# Patient Record
Sex: Male | Born: 1960
Health system: Southern US, Community
[De-identification: ages and names within clinical notes are randomized; demographics above are authoritative.]

## PROBLEM LIST (undated history)

## (undated) DIAGNOSIS — M75101 Unspecified rotator cuff tear or rupture of right shoulder, not specified as traumatic: Secondary | ICD-10-CM

## (undated) DIAGNOSIS — J302 Other seasonal allergic rhinitis: Secondary | ICD-10-CM

## (undated) DIAGNOSIS — F1910 Other psychoactive substance abuse, uncomplicated: Secondary | ICD-10-CM

## (undated) DIAGNOSIS — F1010 Alcohol abuse, uncomplicated: Secondary | ICD-10-CM

## (undated) DIAGNOSIS — G4700 Insomnia, unspecified: Secondary | ICD-10-CM

## (undated) HISTORY — PX: OTHER SURGICAL HISTORY: SHX169

## (undated) HISTORY — DX: Insomnia, unspecified: G47.00

---

## 2000-12-07 ENCOUNTER — Encounter: Payer: Self-pay | Admitting: Emergency Medicine

## 2000-12-07 ENCOUNTER — Emergency Department (HOSPITAL_COMMUNITY): Admission: EM | Admit: 2000-12-07 | Discharge: 2000-12-07 | Payer: Self-pay | Admitting: Emergency Medicine

## 2001-01-27 ENCOUNTER — Emergency Department (HOSPITAL_COMMUNITY): Admission: EM | Admit: 2001-01-27 | Discharge: 2001-01-27 | Payer: Self-pay | Admitting: Emergency Medicine

## 2002-03-09 ENCOUNTER — Emergency Department (HOSPITAL_COMMUNITY): Admission: EM | Admit: 2002-03-09 | Discharge: 2002-03-09 | Payer: Self-pay | Admitting: Emergency Medicine

## 2002-06-17 ENCOUNTER — Emergency Department (HOSPITAL_COMMUNITY): Admission: EM | Admit: 2002-06-17 | Discharge: 2002-06-17 | Payer: Self-pay | Admitting: Emergency Medicine

## 2002-06-17 ENCOUNTER — Encounter: Payer: Self-pay | Admitting: Emergency Medicine

## 2004-04-26 ENCOUNTER — Emergency Department (HOSPITAL_COMMUNITY): Admission: EM | Admit: 2004-04-26 | Discharge: 2004-04-26 | Payer: Self-pay | Admitting: Family Medicine

## 2004-04-27 ENCOUNTER — Emergency Department (HOSPITAL_COMMUNITY): Admission: EM | Admit: 2004-04-27 | Discharge: 2004-04-27 | Payer: Self-pay | Admitting: Family Medicine

## 2004-04-28 ENCOUNTER — Emergency Department (HOSPITAL_COMMUNITY): Admission: EM | Admit: 2004-04-28 | Discharge: 2004-04-28 | Payer: Self-pay | Admitting: Family Medicine

## 2005-06-14 ENCOUNTER — Emergency Department (HOSPITAL_COMMUNITY): Admission: EM | Admit: 2005-06-14 | Discharge: 2005-06-14 | Payer: Self-pay | Admitting: Emergency Medicine

## 2005-06-27 ENCOUNTER — Emergency Department (HOSPITAL_COMMUNITY): Admission: EM | Admit: 2005-06-27 | Discharge: 2005-06-27 | Payer: Self-pay | Admitting: Emergency Medicine

## 2005-07-03 ENCOUNTER — Emergency Department (HOSPITAL_COMMUNITY): Admission: EM | Admit: 2005-07-03 | Discharge: 2005-07-03 | Payer: Self-pay | Admitting: Emergency Medicine

## 2005-10-06 ENCOUNTER — Emergency Department (HOSPITAL_COMMUNITY): Admission: EM | Admit: 2005-10-06 | Discharge: 2005-10-06 | Payer: Self-pay | Admitting: Emergency Medicine

## 2006-01-27 ENCOUNTER — Emergency Department (HOSPITAL_COMMUNITY): Admission: EM | Admit: 2006-01-27 | Discharge: 2006-01-27 | Payer: Self-pay | Admitting: Emergency Medicine

## 2008-01-07 ENCOUNTER — Emergency Department (HOSPITAL_COMMUNITY): Admission: EM | Admit: 2008-01-07 | Discharge: 2008-01-07 | Payer: Self-pay | Admitting: Emergency Medicine

## 2008-01-24 ENCOUNTER — Emergency Department (HOSPITAL_COMMUNITY): Admission: EM | Admit: 2008-01-24 | Discharge: 2008-01-25 | Payer: Self-pay | Admitting: Emergency Medicine

## 2009-10-07 ENCOUNTER — Emergency Department (HOSPITAL_COMMUNITY): Admission: EM | Admit: 2009-10-07 | Discharge: 2009-10-07 | Payer: Self-pay | Admitting: Emergency Medicine

## 2010-07-31 ENCOUNTER — Emergency Department (HOSPITAL_COMMUNITY): Admission: EM | Admit: 2010-07-31 | Discharge: 2010-07-31 | Payer: Self-pay | Admitting: Emergency Medicine

## 2011-03-11 LAB — DIFFERENTIAL
Basophils Absolute: 0.1 10*3/uL (ref 0.0–0.1)
Basophils Relative: 1 % (ref 0–1)
Eosinophils Absolute: 0.3 10*3/uL (ref 0.0–0.7)
Eosinophils Relative: 5 % (ref 0–5)
Lymphocytes Relative: 33 % (ref 12–46)
Lymphs Abs: 2.2 10*3/uL (ref 0.7–4.0)
Monocytes Absolute: 0.4 10*3/uL (ref 0.1–1.0)
Monocytes Relative: 7 % (ref 3–12)
Neutro Abs: 3.7 10*3/uL (ref 1.7–7.7)
Neutrophils Relative %: 55 % (ref 43–77)

## 2011-03-11 LAB — BASIC METABOLIC PANEL
BUN: 6 mg/dL (ref 6–23)
CO2: 23 mEq/L (ref 19–32)
Calcium: 8.8 mg/dL (ref 8.4–10.5)
Chloride: 104 mEq/L (ref 96–112)
Creatinine, Ser: 0.86 mg/dL (ref 0.4–1.5)
GFR calc Af Amer: >60 mL/min (ref >60)
GFR calc non Af Amer: >60 mL/min (ref >60)
Glucose, Bld: 89 mg/dL (ref 70–99)
Potassium: 3.4 mEq/L — ABNORMAL LOW (ref 3.5–5.1)
Sodium: 138 mEq/L (ref 135–145)

## 2011-03-11 LAB — CBC
HCT: 39 % (ref 39.0–52.0)
Hemoglobin: 13.7 g/dL (ref 13.0–17.0)
MCH: 34.1 pg — ABNORMAL HIGH (ref 26.0–34.0)
MCHC: 35.1 g/dL (ref 30.0–36.0)
MCV: 97.3 fL (ref 78.0–100.0)
Platelets: 281 10*3/uL (ref 150–400)
RBC: 4.01 MIL/uL — ABNORMAL LOW (ref 4.22–5.81)
RDW: 13.3 % (ref 11.5–15.5)
WBC: 6.8 10*3/uL (ref 4.0–10.5)

## 2011-03-11 LAB — RAPID URINE DRUG SCREEN, HOSP PERFORMED
Amphetamines: NOT DETECTED
Barbiturates: NOT DETECTED
Benzodiazepines: NOT DETECTED
Cocaine: POSITIVE — AB
Opiates: NOT DETECTED
Tetrahydrocannabinol: NOT DETECTED

## 2011-03-11 LAB — ETHANOL: Alcohol, Ethyl (B): 284 mg/dL — ABNORMAL HIGH (ref 0–10)

## 2011-03-31 LAB — URINALYSIS, ROUTINE W REFLEX MICROSCOPIC
Bilirubin Urine: NEGATIVE
Glucose, UA: NEGATIVE mg/dL
Hgb urine dipstick: NEGATIVE
Ketones, ur: NEGATIVE mg/dL
Nitrite: NEGATIVE
Protein, ur: NEGATIVE mg/dL
Specific Gravity, Urine: 1.006 (ref 1.005–1.030)
Urobilinogen, UA: 0.2 mg/dL (ref 0.0–1.0)
pH: 5 (ref 5.0–8.0)

## 2011-03-31 LAB — ETHANOL: Alcohol, Ethyl (B): 222 mg/dL — ABNORMAL HIGH (ref 0–10)

## 2011-03-31 LAB — DIFFERENTIAL
Basophils Absolute: 0 10*3/uL (ref 0.0–0.1)
Basophils Relative: 0 % (ref 0–1)
Eosinophils Absolute: 0.3 10*3/uL (ref 0.0–0.7)
Eosinophils Relative: 4 % (ref 0–5)
Lymphocytes Relative: 17 % (ref 12–46)
Lymphs Abs: 1.5 10*3/uL (ref 0.7–4.0)
Monocytes Absolute: 0.4 10*3/uL (ref 0.1–1.0)
Monocytes Relative: 4 % (ref 3–12)
Neutro Abs: 6.6 10*3/uL (ref 1.7–7.7)
Neutrophils Relative %: 74 % (ref 43–77)

## 2011-03-31 LAB — COMPREHENSIVE METABOLIC PANEL
ALT: 20 U/L (ref 0–53)
AST: 30 U/L (ref 0–37)
Albumin: 4.3 g/dL (ref 3.5–5.2)
Alkaline Phosphatase: 61 U/L (ref 39–117)
BUN: 6 mg/dL (ref 6–23)
CO2: 22 mEq/L (ref 19–32)
Calcium: 9.1 mg/dL (ref 8.4–10.5)
Chloride: 105 mEq/L (ref 96–112)
Creatinine, Ser: 0.79 mg/dL (ref 0.4–1.5)
GFR calc Af Amer: >60 mL/min (ref >60)
GFR calc non Af Amer: >60 mL/min (ref >60)
Glucose, Bld: 99 mg/dL (ref 70–99)
Potassium: 4 mEq/L (ref 3.5–5.1)
Sodium: 138 mEq/L (ref 135–145)
Total Bilirubin: 0.8 mg/dL (ref 0.3–1.2)
Total Protein: 7.8 g/dL (ref 6.0–8.3)

## 2011-03-31 LAB — RAPID URINE DRUG SCREEN, HOSP PERFORMED
Amphetamines: NOT DETECTED
Barbiturates: NOT DETECTED
Benzodiazepines: NOT DETECTED
Cocaine: POSITIVE — AB
Opiates: NOT DETECTED
Tetrahydrocannabinol: NOT DETECTED

## 2011-03-31 LAB — CBC
HCT: 43 % (ref 39.0–52.0)
Hemoglobin: 14.9 g/dL (ref 13.0–17.0)
MCHC: 34.7 g/dL (ref 30.0–36.0)
MCV: 98.8 fL (ref 78.0–100.0)
Platelets: 303 10*3/uL (ref 150–400)
RBC: 4.36 MIL/uL (ref 4.22–5.81)
RDW: 13 % (ref 11.5–15.5)
WBC: 8.9 10*3/uL (ref 4.0–10.5)

## 2011-09-14 LAB — DIFFERENTIAL
Basophils Absolute: 0.1
Basophils Relative: 2 — ABNORMAL HIGH
Eosinophils Absolute: 0.1
Eosinophils Relative: 4
Lymphocytes Relative: 32
Lymphs Abs: 1.2
Monocytes Absolute: 0.3
Monocytes Relative: 9
Neutro Abs: 2.1
Neutrophils Relative %: 54

## 2011-09-14 LAB — RAPID URINE DRUG SCREEN, HOSP PERFORMED
Amphetamines: NOT DETECTED
Barbiturates: NOT DETECTED
Benzodiazepines: NOT DETECTED
Cocaine: NOT DETECTED
Opiates: NOT DETECTED
Tetrahydrocannabinol: POSITIVE — AB

## 2011-09-14 LAB — I-STAT 8, (EC8 V) (CONVERTED LAB)
Acid-base deficit: 1
BUN: 5 — ABNORMAL LOW
Bicarbonate: 24.4 — ABNORMAL HIGH
Chloride: 109
Glucose, Bld: 89
HCT: 44
Hemoglobin: 15
Operator id: 146091
Potassium: 4.2
Sodium: 140
TCO2: 26
pCO2, Ven: 41.6 — ABNORMAL LOW
pH, Ven: 7.377 — ABNORMAL HIGH

## 2011-09-14 LAB — POCT I-STAT CREATININE
Creatinine, Ser: 0.9
Operator id: 146091

## 2011-09-14 LAB — CBC
HCT: 39.3
Hemoglobin: 13.6
MCHC: 34.5
MCV: 98.1
Platelets: 311
RBC: 4.01 — ABNORMAL LOW
RDW: 13.6
WBC: 3.8 — ABNORMAL LOW

## 2011-09-14 LAB — ETHANOL: Alcohol, Ethyl (B): 17 — ABNORMAL HIGH

## 2011-09-15 LAB — ETHANOL
Alcohol, Ethyl (B): 174 — ABNORMAL HIGH
Alcohol, Ethyl (B): 255 — ABNORMAL HIGH

## 2011-09-15 LAB — DIFFERENTIAL
Basophils Absolute: 0
Basophils Relative: 1
Eosinophils Absolute: 0.3
Eosinophils Relative: 5
Lymphocytes Relative: 35
Lymphs Abs: 2.2
Monocytes Absolute: 0.5
Monocytes Relative: 8
Neutro Abs: 3.2
Neutrophils Relative %: 52

## 2011-09-15 LAB — I-STAT 8, (EC8 V) (CONVERTED LAB)
Acid-base deficit: 1
BUN: 7
Bicarbonate: 25.1 — ABNORMAL HIGH
Chloride: 111
Glucose, Bld: 96
HCT: 45
Hemoglobin: 15.3
Operator id: 270651
Potassium: 4.2
Sodium: 142
TCO2: 26
pCO2, Ven: 43.7 — ABNORMAL LOW
pH, Ven: 7.367 — ABNORMAL HIGH

## 2011-09-15 LAB — CBC
HCT: 40.4
Hemoglobin: 14
MCHC: 34.6
MCV: 97.3
Platelets: 271
RBC: 4.15 — ABNORMAL LOW
RDW: 13.6
WBC: 6.1

## 2011-09-15 LAB — RAPID URINE DRUG SCREEN, HOSP PERFORMED
Amphetamines: NOT DETECTED
Barbiturates: NOT DETECTED
Benzodiazepines: NOT DETECTED
Cocaine: NOT DETECTED
Opiates: NOT DETECTED
Tetrahydrocannabinol: POSITIVE — AB

## 2011-09-15 LAB — POCT I-STAT CREATININE
Creatinine, Ser: 1
Operator id: 270651

## 2013-06-13 ENCOUNTER — Encounter (HOSPITAL_COMMUNITY): Payer: Self-pay | Admitting: Emergency Medicine

## 2013-06-13 ENCOUNTER — Emergency Department (HOSPITAL_COMMUNITY)
Admission: EM | Admit: 2013-06-13 | Discharge: 2013-06-13 | Disposition: A | Discharge status: Home / Self Care | Payer: Self-pay | Attending: Emergency Medicine | Admitting: Emergency Medicine

## 2013-06-13 DIAGNOSIS — F1410 Cocaine abuse, uncomplicated: Secondary | ICD-10-CM | POA: Insufficient documentation

## 2013-06-13 DIAGNOSIS — F1910 Other psychoactive substance abuse, uncomplicated: Secondary | ICD-10-CM

## 2013-06-13 DIAGNOSIS — F1010 Alcohol abuse, uncomplicated: Secondary | ICD-10-CM | POA: Insufficient documentation

## 2013-06-13 HISTORY — DX: Other psychoactive substance abuse, uncomplicated: F19.10

## 2013-06-13 HISTORY — DX: Alcohol abuse, uncomplicated: F10.10

## 2013-06-13 LAB — COMPREHENSIVE METABOLIC PANEL
ALT: 21 U/L (ref 0–53)
AST: 28 U/L (ref 0–37)
Albumin: 3.6 g/dL (ref 3.5–5.2)
Alkaline Phosphatase: 59 U/L (ref 39–117)
BUN: 8 mg/dL (ref 6–23)
CO2: 22 mEq/L (ref 19–32)
Calcium: 8.7 mg/dL (ref 8.4–10.5)
Chloride: 101 mEq/L (ref 96–112)
Creatinine, Ser: 0.71 mg/dL (ref 0.50–1.35)
GFR calc Af Amer: >90 mL/min (ref >90)
GFR calc non Af Amer: >90 mL/min (ref >90)
Glucose, Bld: 138 mg/dL — ABNORMAL HIGH (ref 70–99)
Potassium: 4 mEq/L (ref 3.5–5.1)
Sodium: 133 mEq/L — ABNORMAL LOW (ref 135–145)
Total Bilirubin: 0.2 mg/dL — ABNORMAL LOW (ref 0.3–1.2)
Total Protein: 7 g/dL (ref 6.0–8.3)

## 2013-06-13 LAB — CBC
HCT: 39.3 % (ref 39.0–52.0)
Hemoglobin: 13.6 g/dL (ref 13.0–17.0)
MCH: 32.9 pg (ref 26.0–34.0)
MCHC: 34.6 g/dL (ref 30.0–36.0)
MCV: 95.2 fL (ref 78.0–100.0)
Platelets: 294 10*3/uL (ref 150–400)
RBC: 4.13 MIL/uL — ABNORMAL LOW (ref 4.22–5.81)
RDW: 12.5 % (ref 11.5–15.5)
WBC: 6 10*3/uL (ref 4.0–10.5)

## 2013-06-13 LAB — RAPID URINE DRUG SCREEN, HOSP PERFORMED
Amphetamines: NOT DETECTED
Barbiturates: NOT DETECTED
Benzodiazepines: NOT DETECTED
Cocaine: POSITIVE — AB
Opiates: NOT DETECTED
Tetrahydrocannabinol: NOT DETECTED

## 2013-06-13 LAB — ACETAMINOPHEN LEVEL: Acetaminophen (Tylenol), Serum: <15 ug/mL (ref 10–30)

## 2013-06-13 LAB — ETHANOL: Alcohol, Ethyl (B): <11 mg/dL (ref 0–11)

## 2013-06-13 LAB — SALICYLATE LEVEL: Salicylate Lvl: <2 mg/dL — ABNORMAL LOW (ref 2.8–20.0)

## 2013-06-13 MED ORDER — IBUPROFEN 600 MG PO TABS: 600.0000 mg | ORAL_TABLET | Freq: Three times a day (TID) | ORAL | Status: DC | PRN

## 2013-06-13 MED ORDER — ZOLPIDEM TARTRATE 5 MG PO TABS: 5.0000 mg | ORAL_TABLET | Freq: Every evening | ORAL | Status: DC | PRN

## 2013-06-13 MED ORDER — LORAZEPAM 1 MG PO TABS: 1.0000 mg | ORAL_TABLET | Freq: Three times a day (TID) | ORAL | Status: DC | PRN

## 2013-06-13 MED ORDER — ACETAMINOPHEN 325 MG PO TABS: 650.0000 mg | ORAL_TABLET | ORAL | Status: DC | PRN

## 2013-06-13 MED ORDER — ALUM & MAG HYDROXIDE-SIMETH 200-200-20 MG/5ML PO SUSP: 30.0000 mL | ORAL | Status: DC | PRN

## 2013-06-13 MED ORDER — ONDANSETRON HCL 4 MG PO TABS: 4.0000 mg | ORAL_TABLET | Freq: Three times a day (TID) | ORAL | Status: DC | PRN

## 2013-06-13 NOTE — ED Notes (Signed)
Oriented pt to psych unit. Pt verbalized understanding.

## 2013-06-13 NOTE — ED Provider Notes (Signed)
History     CSN: [id number]  Arrival date & time 06/13/13  1245   First MD Initiated Contact with Patient 06/13/13 1410      Chief Complaint  Patient presents with  . Medical Clearance    (Consider location/radiation/quality/duration/timing/severity/associated sxs/prior treatment) HPI Comments: Patient is a 52 year old male who presents for drug and alcohol detox. Patient reports he has been using crack cocaine and alcohol daily for the past 30 years. Patient wants help with detox because he doesn't like living like this anymore. Patient reports last use is this morning. He denies SI/HI. Patient denies any other symptoms.    Past Medical History  Diagnosis Date  . Alcohol abuse   . Drug abuse     History reviewed. No pertinent past surgical history.  No family history on file.  History  Substance Use Topics  . Smoking status: Not on file  . Smokeless tobacco: Not on file  . Alcohol Use: Not on file      Review of Systems  Psychiatric/Behavioral:       Substance abuse  All other systems reviewed and are negative.    Allergies  Review of patient's allergies indicates no known allergies.  Home Medications  No current outpatient prescriptions on file.  BP 129/85  Pulse 85  Temp(Src) 98.4 F (36.9 C) (Oral)  Resp 20  SpO2 97%  Physical Exam  Nursing note and vitals reviewed. Constitutional: He is oriented to person, place, and time. He appears well-developed and well-nourished. No distress.  HENT:  Head: Normocephalic and atraumatic.  Eyes: Conjunctivae are normal.  Neck: Normal range of motion.  Cardiovascular: Normal rate and regular rhythm.  Exam reveals no gallop and no friction rub.   No murmur heard. Pulmonary/Chest: Effort normal and breath sounds normal. He has no wheezes. He has no rales. He exhibits no tenderness.  Abdominal: Soft. There is no tenderness.  Musculoskeletal: Normal range of motion.  Neurological: He is alert and oriented to  person, place, and time. Coordination normal.  Speech is goal-oriented. Moves limbs without ataxia.   Skin: Skin is warm and dry.  Psychiatric:  Dysphoric mood.     ED Course  Procedures (including critical care time)  Labs Reviewed  CBC - Abnormal; Notable for the following:    RBC 4.13 (*)    All other components within normal limits  COMPREHENSIVE METABOLIC PANEL - Abnormal; Notable for the following:    Sodium 133 (*)    Glucose, Bld 138 (*)    Total Bilirubin 0.2 (*)    All other components within normal limits  SALICYLATE LEVEL - Abnormal; Notable for the following:    Salicylate Lvl <2.0 (*)    All other components within normal limits  URINE RAPID DRUG SCREEN (HOSP PERFORMED) - Abnormal; Notable for the following:    Cocaine POSITIVE (*)    All other components within normal limits  ACETAMINOPHEN LEVEL  ETHANOL   No results found.   1. Substance abuse       MDM  2:13 PM Patient will go to psych for further evaluation. Labs pending.        Emilia Beck, PA-C 06/15/13 1034  Medical screening examination/treatment/procedure(s) were performed by non-physician practitioner and as supervising physician I was immediately available for consultation/collaboration.   Derwood Kaplan, MD 06/15/13 1221

## 2013-06-13 NOTE — ED Notes (Signed)
Pt states that he drinks beer daily and smokes crack and has been doing it for years. States that he lost his job and went back to the drugs, denies si/hi and states that he wants help for this. Calm and volun. Here.

## 2013-12-13 ENCOUNTER — Inpatient Hospital Stay: Payer: Self-pay | Admitting: Student

## 2013-12-13 LAB — DRUG SCREEN, URINE
Amphetamines, Ur Screen: NEGATIVE (ref ?–1000)
Barbiturates, Ur Screen: NEGATIVE (ref ?–200)
Benzodiazepine, Ur Scrn: NEGATIVE (ref ?–200)
Cannabinoid 50 Ng, Ur ~~LOC~~: NEGATIVE (ref ?–50)
Cocaine Metabolite,Ur ~~LOC~~: NEGATIVE (ref ?–300)
MDMA (Ecstasy)Ur Screen: NEGATIVE (ref ?–500)
Methadone, Ur Screen: NEGATIVE (ref ?–300)
Opiate, Ur Screen: POSITIVE (ref ?–300)
Phencyclidine (PCP) Ur S: NEGATIVE (ref ?–25)
Tricyclic, Ur Screen: NEGATIVE (ref ?–1000)

## 2013-12-13 LAB — CBC
HCT: 38 % — ABNORMAL LOW (ref 40.0–52.0)
HGB: 12.7 g/dL — ABNORMAL LOW (ref 13.0–18.0)
MCH: 31.6 pg (ref 26.0–34.0)
MCHC: 33.4 g/dL (ref 32.0–36.0)
MCV: 95 fL (ref 80–100)
Platelet: 280 10*3/uL (ref 150–440)
RBC: 4.02 10*6/uL — ABNORMAL LOW (ref 4.40–5.90)
RDW: 12.8 % (ref 11.5–14.5)
WBC: 6.1 10*3/uL (ref 3.8–10.6)

## 2013-12-13 LAB — COMPREHENSIVE METABOLIC PANEL
Albumin: 3.1 g/dL — ABNORMAL LOW (ref 3.4–5.0)
Alkaline Phosphatase: 60 U/L
Anion Gap: 6 — ABNORMAL LOW (ref 7–16)
BUN: 9 mg/dL (ref 7–18)
Bilirubin,Total: 0.4 mg/dL (ref 0.2–1.0)
Calcium, Total: 8.3 mg/dL — ABNORMAL LOW (ref 8.5–10.1)
Chloride: 109 mmol/L — ABNORMAL HIGH (ref 98–107)
Co2: 24 mmol/L (ref 21–32)
Creatinine: 0.66 mg/dL (ref 0.60–1.30)
EGFR (African American): >60
EGFR (Non-African Amer.): >60
Glucose: 109 mg/dL — ABNORMAL HIGH (ref 65–99)
Osmolality: 277 (ref 275–301)
Potassium: 3.5 mmol/L (ref 3.5–5.1)
SGOT(AST): 26 U/L (ref 15–37)
SGPT (ALT): 22 U/L (ref 12–78)
Sodium: 139 mmol/L (ref 136–145)
Total Protein: 6.1 g/dL — ABNORMAL LOW (ref 6.4–8.2)

## 2013-12-14 LAB — CBC WITH DIFFERENTIAL/PLATELET
Basophil #: 0.1 10*3/uL (ref 0.0–0.1)
Basophil %: 1 %
Eosinophil #: 0.4 10*3/uL (ref 0.0–0.7)
Eosinophil %: 6.1 %
HCT: 35.5 % — ABNORMAL LOW (ref 40.0–52.0)
HGB: 12 g/dL — ABNORMAL LOW (ref 13.0–18.0)
Lymphocyte #: 1.5 10*3/uL (ref 1.0–3.6)
Lymphocyte %: 23.1 %
MCH: 31.9 pg (ref 26.0–34.0)
MCHC: 33.8 g/dL (ref 32.0–36.0)
MCV: 95 fL (ref 80–100)
Monocyte #: 0.6 x10 3/mm (ref 0.2–1.0)
Monocyte %: 9.9 %
Neutrophil #: 3.9 10*3/uL (ref 1.4–6.5)
Neutrophil %: 59.9 %
Platelet: 252 10*3/uL (ref 150–440)
RBC: 3.76 10*6/uL — ABNORMAL LOW (ref 4.40–5.90)
RDW: 13 % (ref 11.5–14.5)
WBC: 6.5 10*3/uL (ref 3.8–10.6)

## 2013-12-14 LAB — HEMOGLOBIN A1C: Hemoglobin A1C: 5.6 % (ref 4.2–6.3)

## 2013-12-14 LAB — BASIC METABOLIC PANEL
Anion Gap: 6 — ABNORMAL LOW (ref 7–16)
BUN: 11 mg/dL (ref 7–18)
Calcium, Total: 8.4 mg/dL — ABNORMAL LOW (ref 8.5–10.1)
Chloride: 105 mmol/L (ref 98–107)
Co2: 26 mmol/L (ref 21–32)
Creatinine: 0.81 mg/dL (ref 0.60–1.30)
EGFR (African American): >60
EGFR (Non-African Amer.): >60
Glucose: 124 mg/dL — ABNORMAL HIGH (ref 65–99)
Osmolality: 275 (ref 275–301)
Potassium: 3.4 mmol/L — ABNORMAL LOW (ref 3.5–5.1)
Sodium: 137 mmol/L (ref 136–145)

## 2013-12-15 LAB — VANCOMYCIN, TROUGH: Vancomycin, Trough: 15 ug/mL (ref 10–20)

## 2013-12-18 LAB — CULTURE, BLOOD (SINGLE)

## 2013-12-18 LAB — WOUND CULTURE

## 2014-02-03 ENCOUNTER — Emergency Department: Payer: Self-pay | Admitting: Emergency Medicine

## 2014-06-23 ENCOUNTER — Emergency Department (HOSPITAL_COMMUNITY)
Admission: EM | Admit: 2014-06-23 | Discharge: 2014-06-23 | Disposition: A | Discharge status: Home / Self Care | Payer: Managed Care, Other (non HMO) | Attending: Emergency Medicine | Admitting: Emergency Medicine

## 2014-06-23 ENCOUNTER — Encounter (HOSPITAL_COMMUNITY): Payer: Self-pay | Admitting: Emergency Medicine

## 2014-06-23 DIAGNOSIS — T63461A Toxic effect of venom of wasps, accidental (unintentional), initial encounter: Secondary | ICD-10-CM | POA: Insufficient documentation

## 2014-06-23 DIAGNOSIS — Z79899 Other long term (current) drug therapy: Secondary | ICD-10-CM | POA: Insufficient documentation

## 2014-06-23 DIAGNOSIS — Y929 Unspecified place or not applicable: Secondary | ICD-10-CM | POA: Insufficient documentation

## 2014-06-23 DIAGNOSIS — F17200 Nicotine dependence, unspecified, uncomplicated: Secondary | ICD-10-CM | POA: Insufficient documentation

## 2014-06-23 DIAGNOSIS — Y939 Activity, unspecified: Secondary | ICD-10-CM | POA: Insufficient documentation

## 2014-06-23 DIAGNOSIS — Z91030 Bee allergy status: Secondary | ICD-10-CM

## 2014-06-23 DIAGNOSIS — IMO0002 Reserved for concepts with insufficient information to code with codable children: Secondary | ICD-10-CM | POA: Insufficient documentation

## 2014-06-23 DIAGNOSIS — T6391XA Toxic effect of contact with unspecified venomous animal, accidental (unintentional), initial encounter: Secondary | ICD-10-CM | POA: Insufficient documentation

## 2014-06-23 MED ORDER — DIPHENHYDRAMINE HCL 25 MG PO CAPS
50.0000 mg | ORAL_CAPSULE | Freq: Once | ORAL | Status: AC
  Administered 2014-06-23: 50 mg via ORAL
  Filled 2014-06-23: qty 2

## 2014-06-23 MED ORDER — FAMOTIDINE 20 MG PO TABS
40.0000 mg | ORAL_TABLET | Freq: Once | ORAL | Status: AC
  Administered 2014-06-23: 40 mg via ORAL
  Filled 2014-06-23: qty 2

## 2014-06-23 MED ORDER — PREDNISONE 20 MG PO TABS: 40.0000 mg | ORAL_TABLET | Freq: Every day | ORAL | Status: DC

## 2014-06-23 MED ORDER — DIPHENHYDRAMINE HCL 25 MG PO TABS: 25.0000 mg | ORAL_TABLET | Freq: Four times a day (QID) | ORAL | Status: DC | PRN

## 2014-06-23 MED ORDER — FAMOTIDINE 20 MG PO TABS: 20.0000 mg | ORAL_TABLET | Freq: Two times a day (BID) | ORAL | Status: DC

## 2014-06-23 MED ORDER — PREDNISONE 20 MG PO TABS
60.0000 mg | ORAL_TABLET | Freq: Once | ORAL | Status: AC
  Administered 2014-06-23: 60 mg via ORAL
  Filled 2014-06-23: qty 3

## 2014-06-23 NOTE — ED Notes (Signed)
Patient reports his hand is still hurting, and can't tell the difference in swelling.

## 2014-06-23 NOTE — ED Notes (Signed)
Reported patient's request for food to [place], [place], and reported no increase in swelling of right hand.  She acknowledges and allows patient to have food at this time.

## 2014-06-23 NOTE — ED Notes (Signed)
PA at bedside.

## 2014-06-23 NOTE — ED Notes (Signed)
Hannah, PA-C, at the bedside.  

## 2014-06-23 NOTE — Discharge Instructions (Signed)
1. Medications: prednisone, benadryl, pepcid, usual home medications 2. Treatment: rest, drink plenty of fluids, use ice for swelling and pain 3. Follow Up: Please followup with your primary doctor for discussion of your diagnoses and further evaluation after today's visit; if you do not have a primary care doctor use the resource guide provided to find one;     Bee, Wasp, or [place] Your caregiver has diagnosed you as having an insect sting. An insect sting appears as a red lump in the skin that sometimes has a tiny hole in the center, or it may have a stinger in the center of the wound. The most common stings are from wasps, hornets and bees. Individuals have different reactions to insect stings.  A normal reaction may cause pain, swelling, and redness around the sting site.  A localized allergic reaction may cause swelling and redness that extends beyond the sting site.  A large local reaction may continue to develop over the next 12 to 36 hours.  On occasion, the reactions can be severe (anaphylactic reaction). An anaphylactic reaction may cause wheezing; difficulty breathing; chest pain; fainting; raised, itchy, red patches on the skin; a sick feeling to your stomach (nausea); vomiting; cramping; or diarrhea. If you have had an anaphylactic reaction to an insect sting in the past, you are more likely to have one again. HOME CARE INSTRUCTIONS   With bee stings, a small sac of poison is left in the wound. Brushing across this with something such as a credit card, or anything similar, will help remove this and decrease the amount of the reaction. This same procedure will not help a wasp sting as they do not leave behind a stinger and poison sac.  Apply a cold compress for 10 to 20 minutes every hour for 1 to 2 days, depending on severity, to reduce swelling and itching.  To lessen pain, a paste made of water and baking soda may be rubbed on the bite or sting and left on for 5  minutes.  To relieve itching and swelling, you may use take medication or apply medicated creams or lotions as directed.  Only take over-the-counter or prescription medicines for pain, discomfort, or fever as directed by your caregiver.  Wash the sting site daily with soap and water. Apply antibiotic ointment on the sting site as directed.  If you suffered a severe reaction:  If you did not require hospitalization, an adult will need to stay with you for 24 hours in case the symptoms return.  You may need to wear a medical bracelet or necklace stating the allergy.  You and your family need to learn when and how to use an anaphylaxis kit or epinephrine injection.  If you have had a severe reaction before, always carry your anaphylaxis kit with you. SEEK MEDICAL CARE IF:   None of the above helps within 2 to 3 days.  The area becomes red, warm, tender, and swollen beyond the area of the bite or sting.  You have an oral temperature above 102 F (38.9 C). SEEK IMMEDIATE MEDICAL CARE IF:  You have symptoms of an allergic reaction which are:  Wheezing.  Difficulty breathing.  Chest pain.  Lightheadedness or fainting.  Itchy, raised, red patches on the skin.  Nausea, vomiting, cramping or diarrhea. ANY OF THESE SYMPTOMS MAY REPRESENT A SERIOUS PROBLEM THAT IS AN EMERGENCY. Do not wait to see if the symptoms will go away. Get medical help right away. Call your local emergency services (  911 in U.S[place] DO NOT drive yourself to the hospital. MAKE SURE YOU:   Understand these instructions.  Will watch your condition.  Will get help right away if you are not doing well or get worse. Document Released: 12/12/2005 Document Revised: 03/05/2012 Document Reviewed: 05/29/2010 [facility] Patient Information 2015 [place], [place]. This information is not intended to replace advice given to you by your health care provider. Make sure you discuss any questions you have with your health care  provider.   Emergency Department Resource Guide 1) Find a Doctor and Pay Out of Pocket Although you won't have to find out who is covered by your insurance plan, it is a good idea to ask around and get recommendations. You will then need to call the office and see if the doctor you have chosen will accept you as a new patient and what types of options they offer for patients who are self-pay. Some doctors offer discounts or will set up payment plans for their patients who do not have insurance, but you will need to ask so you aren't surprised when you get to your appointment.  2) Contact Your Local Health Department Not all health departments have doctors that can see patients for sick visits, but many do, so it is worth a call to see if yours does. If you don't know where your local health department is, you can check in your phone book. The CDC also has a tool to help you locate your state's health department, and many state websites also have listings of all of their local health departments.  3) Find a [facility] If your illness is not likely to be very severe or complicated, you may want to try a walk in clinic. These are popping up all over the country in pharmacies, drugstores, and shopping centers. They're usually staffed by nurse practitioners or physician assistants that have been trained to treat common illnesses and complaints. They're usually fairly quick and inexpensive. However, if you have serious medical issues or chronic medical problems, these are probably not your best option.  No Primary Care Doctor: - Call Health Connect at  [phone] - they can help you locate a primary care doctor that  accepts your insurance, provides certain services, etc. - Physician Referral Service- [phone]  Chronic Pain Problems: Organization         Address  Phone   Notes  [facility]  [phone] Patients need to be referred by their primary care doctor.    Medication Assistance: Organization         Address  Phone   Notes  [facility] Medication [facility] [address] [place], [place] [address] [phone] --Must be a resident of [facility] -- Must have NO insurance coverage whatsoever (no Medicaid/ Medicare, etc.) -- The pt. MUST have a primary care doctor that directs their care regularly and follows them in the community   [facility]ist  [phone]   [employer]  [phone]    Agencies that provide inexpensive medical care: Organization         Address  Phone   Notes  [place]  [phone]   [facility]    [phone]   [facility] [place], [place] [phone]   [place] [address] [place] [phone]   Planned Parenthood    [phone]   [place]  [facility]    [phone]   Community Health and [facility]  [address], [place] Phone:  [phone], Fax:  [phone] Hours of Operation:  9 am - 6 pm, M-F.  Also accepts Medicaid/Medicare and self-pay.  [facility] for [place] [place], [address], [place] Phone: [phone], Fax: [phone]. Hours of Operation:  8:30 am - 5:30 pm, M-F.  Also accepts Medicaid and self-pay.  [facility] [address], [place] Phone: [phone]   [place], [place], [place] [phone], Ext. [phone] Mondays & Thursdays: 7-9 AM.  First 15 patients are seen on a first come, first serve basis.    [place] Providers:  Organization         Address  Phone   Notes  [facility] [address], [place] [phone] Also accepts self-pay patients.  [facility] [address], [place]  [phone]   [place], [address], [place] [phone]   [facility] [address], [place] [phone]   Lucianne Lei [address], [place]   [phone] Only accepts [name] Access [place] patients after they have their name applied to their card.   Self-Pay (no insurance) in [facility]:  Organization         Address  Phone   Notes  Sickle Cell Patients, [facility] [place] [phone]   [facility] [place] [phone]   [facility] [place]  [place], [place], [place] [phone]   [facility]/Dr. Osei-Bonsu  [address], [place] or [address], [place] [phone] Phone number for both [place] and [place] locations is the same.  Urgent Medical and [facility] [address], [place] [phone]   [facility] [address], [place] or [address] [phone]   [facility] [address] [place] [phone], phone; [phone], fax Sees patients 1st and 3rd Saturday of every month.  Must not qualify for public or private insurance (i.e. Medicaid, Medicare, [place] Health Choice, Veterans' Benefits)  Household income should be no more than 200% of the poverty level The clinic cannot treat you if you are pregnant or think you are pregnant  Sexually transmitted diseases are not treated at the clinic.    Dental Care: Organization         Address  Phone  Notes  [facility] Department of [facility] [place] [phone] Accepts children up to age 18 who are enrolled in [place] or [place]; pregnant women with a Medicaid card; and children who have applied for Medicaid or [place] Health Choice, but were declined, whose parents can pay a reduced fee at time of service.   [facility] Department of [facility]  [address], [place] [phone] Accepts children up to age 74 who are enrolled in [place] or [place]; pregnant women with a Medicaid card; and children who have applied for Medicaid or [place] Health Choice, but were declined, whose parents can pay a reduced fee at time of  service.  [place] Adult Dental Access PROGRAM  [facility] [place] [phone] Patients are seen by appointment only. Walk-ins are not accepted. Lane will see patients 73 years of age and older. Monday - Tuesday (8am-5pm) Most Wednesdays (8:30-5pm) $30 per visit, cash only  [facility] Adult Dental Access PROGRAM  [address], [facility] [phone] Patients are seen by appointment only. Walk-ins are not accepted. [place] will see patients 21 years of age and older. One Wednesday Evening (Monthly: Volunteer Based).  $30 per visit, cash only  [place]  [phone] for adults; Children under age 54, call Graduate Pediatric Dentistry at [phone]. Children aged 7-14, please call [phone] to request a pediatric application.  Dental services are provided in all areas of dental care including fillings, crowns and bridges, complete and partial dentures, implants, gum treatment, root canals, and extractions. Preventive care is also provided. Treatment is provided to both adults and children. Patients are selected via a lottery and there is often a waiting list.   [facility] [address], [place]  [phone] [email or web address]   [facility] [address] [place], [place] [phone], Ext. [phone] Second and Fourth Thursday of each month, opens at 6:30 AM; Clinic ends at 9 AM.  Patients are seen on a first-come first-served basis, and a limited number are seen during each clinic.   [facility]  [address] Hillard Danker [place], [place] [phone]   Eligibility Requirements You must have lived in [place], [place], or [place] counties for at least the last three months.   You cannot be eligible for state or federal sponsored [employer], including [employer], [place], or [employer].   You generally cannot be eligible for healthcare insurance through your employer.    How to apply: Eligibility screenings are held every Tuesday and Wednesday afternoon from 1:00 pm until 4:00 pm. You do not need an appointment for the interview!  [facility] [address] [place], [place]   [place]  [place] Department  [place]  [phone]    Behavioral Health Resources in the Community: Intensive Outpatient Programs Organization         Address  Phone  Notes  [place]. [address], [place], [place] [phone]   [facility] Outpatient [address], [place], [place]   ADS: Alcohol & Drug Svcs [address] [place], [place]   [place] [address]  [place], [place] or [phone]   Substance Abuse Resources Organization         Address  Phone  Notes  Alcohol and Drug Services  [phone]   [place]  [phone]   [facility] [place]   Chinita Pester  [phone]   Residential & Outpatient Substance Abuse Program  [phone]   Psychological Services Organization         Address  Phone  Notes  [facility] [place]  [place]  [phone]   [place] [address] [place] [phone] or [phone]    Mobile Crisis Teams Organization         Address  Phone  Notes  [facility], Mobile Crisis Care Unit  [phone]   Assertive Psychotherapeutic Services  [place], [place]   [facility] [address] [place] [phone]    Self-Help/Support Groups Organization         Address  Phone             Notes  [place]. of [place] - variety of support groups  [place] Call for more information  Narcotics Anonymous (NA), [facility] [address], [employer] [place]  2 meetings at this location   Special educational needs teacher         Address  Phone  Notes  ASAP Residential Treatment [place],    [place]  [phone]   [facility]  [address] [place] [address], [place], [place]   [place], [place] [phone] Admissions: 8am-3pm M-F  Incentives Substance [place] [address]    [place], [place] [phone]   [facility] [address] [place], [place], [place]   [facility] [address],  [place], [place]   [facility] - Intensive Outpatient [address] Kristeen [address], [place], [place]   [facility] [place].,  [place], [place] [phone] or [phone]   [name] [address], [place], [place] Accepts Medicaid  [facility] [address],  [place] [phone] Substance Abuse/Addiction Treatment   [facility] Organization         Address  Phone  Notes  [facility]  [phone]   Domenic Schwab, PhD [address] Arlis Porta [place], [place]   [phone] or [phone]   [place], [place] [phone]   [facility] [address], [place], [place] [phone] Insurance/Medicaid/sponsorship through [facility] and Families [address]                                    [place], [place] [phone]  [place] [address], [place] [phone]    Dr. Adele Schilder  [phone]   [facility] Dept. 1) [address],  2) [place] 3)  [place] [address], [place] [phone]  [phone]   [place] [phone] or [phone] (After Hours)

## 2014-06-23 NOTE — ED Notes (Signed)
PT ambulated with baseline gait; VSS; A&Ox3; no signs of distress; respirations even and unlabored; skin warm and dry; no questions upon discharge.  

## 2014-06-23 NOTE — ED Provider Notes (Signed)
CSN: [id number]     Arrival date & time 06/23/14  2020 History   First MD Initiated Contact with Patient 06/23/14 2054     Chief Complaint  Patient presents with  . Insect Bite     (Consider location/radiation/quality/duration/timing/severity/associated sxs/prior Treatment) The history is provided by the patient and medical records. No language interpreter was used.    [name] [name] is a 53 y.o. male  with a hx of alcohol and drug abuse presents to the Emergency Department complaining of gradual, persistent, progressively worsening swelling of the right hand and the left upper arm beginning approximately 5 PM after being stung several times by yellow jackets. Patient reports he was working outside when this happened. He reports increased swelling and pain at the site of the bite and through the hand and arm. He denies shortness of breath, chest pain, rash anywhere else, feeling of throat or mouth is closing. No aggravating or alleviating factors. Patient reports he did take ibuprofen without relief prior to arrival but did not attempt to take Benadryl. She denies fever, chills, headache, neck pain, difficulty breathing, difficulty swallowing, further rash, nausea, vomiting.   Past Medical History  Diagnosis Date  . Alcohol abuse   . Drug abuse    History reviewed. No pertinent past surgical history. History reviewed. No pertinent family history. History  Substance Use Topics  . Smoking status: Current Every Day Smoker  . Smokeless tobacco: Not on file  . Alcohol Use: 1.8 oz/week    3 Cans of beer per week     Comment: daily    Review of Systems  Constitutional: Negative for fever, diaphoresis, appetite change, fatigue and unexpected weight change.  HENT: Negative for mouth sores.   Eyes: Negative for visual disturbance.  Respiratory: Negative for cough, chest tightness, shortness of breath and wheezing.   Cardiovascular: Negative for chest pain.  Gastrointestinal: Negative  for nausea, vomiting, abdominal pain, diarrhea and constipation.  Endocrine: Negative for polydipsia, polyphagia and polyuria.  Genitourinary: Negative for dysuria, urgency, frequency and hematuria.  Musculoskeletal: Negative for back pain and neck stiffness.  Skin: Positive for rash and wound.  Allergic/Immunologic: Negative for immunocompromised state.  Neurological: Negative for syncope, light-headedness and headaches.  Hematological: Does not bruise/bleed easily.  Psychiatric/Behavioral: Negative for sleep disturbance. The patient is not nervous/anxious.       Allergies  Review of patient's allergies indicates no known allergies.  Home Medications   Prior to Admission medications   Medication Sig Start Date End Date Taking? Authorizing Provider  diphenhydrAMINE (BENADRYL) 25 MG tablet Take 1 tablet (25 mg total) by mouth every 6 (six) hours as needed for itching (Rash). 06/23/14   Inas Avena, PA-C  famotidine (PEPCID) 20 MG tablet Take 1 tablet (20 mg total) by mouth 2 (two) times daily. 06/23/14   Tais Koestner, PA-C  predniSONE (DELTASONE) 20 MG tablet Take 2 tablets (40 mg total) by mouth daily. 06/23/14   Terelle Dobler, PA-C   BP 131/86  Pulse 78  Temp(Src) 98.5 F (36.9 C) (Oral)  Resp 18  Ht 5\' 6"  (1.676 m)  Wt 145 lb (65.772 kg)  BMI 23.41 kg/m2  SpO2 98% Physical Exam  Nursing note and vitals reviewed. Constitutional: He appears well-developed and well-nourished. No distress.  Awake, alert, nontoxic appearance  HENT:  Head: Normocephalic and atraumatic.  Mouth/Throat: Oropharynx is clear and moist. No oropharyngeal exudate.  Eyes: Conjunctivae are normal. No scleral icterus.  Neck: Normal range of motion. Neck supple.  Patent airway  No stridor Handling secretions without difficulty  Cardiovascular: Normal rate, regular rhythm, normal heart sounds and intact distal pulses.   No murmur heard. Capillary refill less than 3 seconds   Pulmonary/Chest: Effort normal and breath sounds normal. No respiratory distress. He has no wheezes.  Abdominal: Soft. Bowel sounds are normal. He exhibits no mass. There is no tenderness. There is no rebound and no guarding.  Musculoskeletal: Normal range of motion. He exhibits no edema.  Full range of motion of all fingers on the bilateral hands with strong and equal grip strength Nonpitting edema to the right hand  Neurological: He is alert.  Speech is clear and goal oriented Moves extremities without ataxia Sensation intact to bilateral upper extremities  Skin: Skin is warm and dry. He is not diaphoretic.  Erythema and swelling noted to the entirety of the right hand and left upper inner arm No widespread urticaria or hives  Psychiatric: He has a normal mood and affect.    ED Course  Procedures (including critical care time) Labs Review Labs Reviewed - No data to display  Imaging Review No results found.   EKG Interpretation None      MDM   Final diagnoses:  Bee sting allergy   [name] [name] presents after several bee stings with swelling and erythema to the sites. Evidence of local inflammatory reaction however no signs of systemic reaction at this time. Patient with patent airway, no stridor and handling secretions without difficulty. No feelings of throat closing and no evidence of edema to the posterior oropharynx on exam. Will give Benadryl, prednisone and Pepcid and reassess.  Patient re-evaluated prior to dc, is hemodynamically stable, in no respiratory distress, and denies the feeling of throat closing. Pt has been advised to take OTC benadryl, prednisone and pepcid & return to the ED if they have a mod-severe allergic rxn (s/s including throat closing, difficulty breathing, swelling of lips face or tongue). Pt is to follow up with their PCP. Pt is agreeable with plan & verbalizes understanding.  I have personally reviewed patient's vitals, nursing note and any  pertinent labs or imaging.  I performed an undressed physical exam.    At this time, it has been determined that no acute conditions requiring further emergency intervention. The patient/guardian have been advised of the diagnosis and plan. I reviewed all labs and imaging including any potential incidental findings. We have discussed signs and symptoms that warrant return to the ED, such as trouble breathing or others as listed aboev.  Patient/guardian has voiced understanding and agreed to follow-up with the PCP or specialist in 24 hours.  Vital signs are stable at discharge.   BP 131/86  Pulse 78  Temp(Src) 98.5 F (36.9 C) (Oral)  Resp 18  Ht 5\' 6"  (1.676 m)  Wt 145 lb (65.772 kg)  BMI 23.41 kg/m2  SpO2 98%        Dierdre Forth, PA-C 06/23/14 2245

## 2014-06-23 NOTE — ED Notes (Signed)
Informed Hannah, PA-C, of patient status, no complaints other than swollen hand and arm pain from bee sting.

## 2014-06-23 NOTE — ED Notes (Signed)
Patient was doing yard work, patient was stung on right hand and left arm by yellow jackets.  Patient denies any shortness of breath, no hives at this time.  Right hand is swollen, throbbing and warm to the touch.

## 2014-06-23 NOTE — ED Notes (Signed)
Pt reports the swelling is not worsening. Ice given.

## 2014-06-23 NOTE — ED Provider Notes (Signed)
Medical screening examination/treatment/procedure(s) were performed by non-physician practitioner and as supervising physician I was immediately available for consultation/collaboration.   EKG Interpretation None        Layla Maw Rio Taber, DO 06/23/14 2321
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
## 2015-04-18 NOTE — Discharge Summary (Signed)
PATIENT NAME:  [name] [name] MR#:  [id number] DATE OF BIRTH:  1961/04/19  DATE OF ADMISSION:  12/13/2013  DATE OF DISCHARGE:  12/15/2013  CONSULTANTS: Dr. Ether Griffins from Podiatry.   PRIMARY CARE PHYSICIAN: None.   CHIEF COMPLAINT: Left lower extremity swelling, pain and drainage.   DISCHARGE DIAGNOSES:  1.  Left lower extremity cellulitis, with also possible erysipelas superinfection with tinea pedis.  2.  Tobacco abuse.  3.  Hyperkalemia.   DISCHARGE MEDICATIONS: Clindamycin 300 mg 1 cap every 8 hours for 7 days, Providian iodine topical spray apply to affected area between toes once daily, terbinafine 250 mg 1 tab once a day for 29 days, keep the area clean and keep the bandage in place, spray a small amount of iodine between toes daily.   DIET: Regular.   ACTIVITY: As tolerated.   FOLLOW UP: Please follow with Dr. Ether Griffins in a couple of weeks, and he may return back to work in one week. If worsening redness, swelling, drainage, or any other issues including fevers, diarrhea, consult with a physician right away.   DISPOSITION: Home.   SIGNIFICANT LABS:  Initial BUN was 9, creatinine 0.66. LFTs showed albumin of 3.1, total protein 6.1, otherwise within normal limits. White count on arrival was 6.1, platelets 280, hemoglobin 12.7. Blood cultures:  No growth to date x 2. Wound culture:  Few gram-negative rods, a few gram-positive cocci, rare gram-positive rods. MRI of the foot showed no evidence of osteomyelitis identified, probable forefoot cellulitis, no definitive abscess.   HISTORY OF PRESENT ILLNESS AND HOSPITAL COURSE:  For full details of H and P, please see the dictation on December 19 by Dr. Jacques [name]. Briefly, this is a 54 year old male with no significant past medical history, on no medications, who came in for the above chief complaint. The problem started between 5 to 7 days ago, as patient was not sure exactly when this happened, and patient thought this was possibly athlete's  foot, and was applying some antifungals, but it did not get better and got worse, so he came in to the hospital. Blood cultures were sent as well as superficial wound cultures. The blood cultures have been negative. He was started on broad-spectrum antibiotics Vancomycin and Zosyn, and MRI was obtained with contrast, and patient was seen by Podiatry. Ultimately, this was deemed not to be osteomyelitis. Per podiatry, the possibility this is erysipelas with tinea pedis is also there and, therefore, he was discharged with clindamycin and an antifungal.   PHYSICAL EXAMINATION: VITAL SIGNS: On the day of discharge, his temperature is 97.4, pulse rate 64, respiratory rate 16, blood pressure 129/82, O2 sat 96% on room air.  GENERAL: The patient is a well-developed male. HEENT:  Normocephalic, atraumatic. Pupils are equal and reactive. Anicteric sclerae.  CARDIOVASCULAR:  S1, S2. No murmurs, rubs or gallops.  LUNGS: Clear to auscultation.  ABDOMEN: Soft, nontender.  EXTREMITIES: No pitting edema. On the foot, patient with improved crusting and redness. No abscess. No open ulcerations.   PLAN:  At this point, he will be discharged with outpatient follow up, and he will be given a work note to be off work for a week. He was given information for deep Dr. Ether Griffins for follow-up.   Total time spent is 35 minutes.   The patient is FULL CODE.   ____________________________ [name] Eaton, MD sa:mr D: 12/15/2013 13:06:58 ET T: 12/15/2013 20:07:42 ET JOB#: [id number]  cc: [name] Eaton, MD, <Dictator> [name] Eaton MD ELECTRONICALLY SIGNED 01/07/2014 11:06
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
## 2016-10-31 ENCOUNTER — Encounter (HOSPITAL_COMMUNITY): Payer: Self-pay | Admitting: Emergency Medicine

## 2016-10-31 ENCOUNTER — Observation Stay (HOSPITAL_COMMUNITY)
Admission: AD | Admit: 2016-10-31 | Discharge: 2016-11-01 | Disposition: A | Discharge status: Rehabilitation Facility | Payer: Self-pay | Source: Intra-hospital | Attending: Psychiatry | Admitting: Psychiatry

## 2016-10-31 ENCOUNTER — Encounter (HOSPITAL_COMMUNITY): Payer: Self-pay

## 2016-10-31 ENCOUNTER — Emergency Department (HOSPITAL_COMMUNITY)
Admission: EM | Admit: 2016-10-31 | Discharge: 2016-10-31 | Disposition: A | Discharge status: Other Acute Inpatient Hospital | Payer: Managed Care, Other (non HMO) | Attending: Physician Assistant | Admitting: Physician Assistant

## 2016-10-31 DIAGNOSIS — Z5181 Encounter for therapeutic drug level monitoring: Secondary | ICD-10-CM | POA: Insufficient documentation

## 2016-10-31 DIAGNOSIS — F1914 Other psychoactive substance abuse with psychoactive substance-induced mood disorder: Principal | ICD-10-CM | POA: Insufficient documentation

## 2016-10-31 DIAGNOSIS — F1910 Other psychoactive substance abuse, uncomplicated: Secondary | ICD-10-CM

## 2016-10-31 DIAGNOSIS — R45851 Suicidal ideations: Secondary | ICD-10-CM

## 2016-10-31 DIAGNOSIS — Z7289 Other problems related to lifestyle: Secondary | ICD-10-CM

## 2016-10-31 DIAGNOSIS — F1010 Alcohol abuse, uncomplicated: Secondary | ICD-10-CM | POA: Insufficient documentation

## 2016-10-31 DIAGNOSIS — R440 Auditory hallucinations: Secondary | ICD-10-CM | POA: Insufficient documentation

## 2016-10-31 DIAGNOSIS — F1410 Cocaine abuse, uncomplicated: Secondary | ICD-10-CM | POA: Insufficient documentation

## 2016-10-31 DIAGNOSIS — Z720 Tobacco use: Secondary | ICD-10-CM

## 2016-10-31 DIAGNOSIS — F17200 Nicotine dependence, unspecified, uncomplicated: Secondary | ICD-10-CM | POA: Insufficient documentation

## 2016-10-31 DIAGNOSIS — Z789 Other specified health status: Secondary | ICD-10-CM

## 2016-10-31 DIAGNOSIS — F1994 Other psychoactive substance use, unspecified with psychoactive substance-induced mood disorder: Secondary | ICD-10-CM | POA: Diagnosis present

## 2016-10-31 LAB — CBC
HCT: 39.2 % (ref 39.0–52.0)
Hemoglobin: 13.5 g/dL (ref 13.0–17.0)
MCH: 33 pg (ref 26.0–34.0)
MCHC: 34.4 g/dL (ref 30.0–36.0)
MCV: 95.8 fL (ref 78.0–100.0)
Platelets: 313 10*3/uL (ref 150–400)
RBC: 4.09 MIL/uL — ABNORMAL LOW (ref 4.22–5.81)
RDW: 12.9 % (ref 11.5–15.5)
WBC: 8.3 10*3/uL (ref 4.0–10.5)

## 2016-10-31 LAB — RAPID URINE DRUG SCREEN, HOSP PERFORMED
Amphetamines: NOT DETECTED
Barbiturates: NOT DETECTED
Benzodiazepines: NOT DETECTED
Cocaine: POSITIVE — AB
Opiates: NOT DETECTED
Tetrahydrocannabinol: NOT DETECTED

## 2016-10-31 LAB — COMPREHENSIVE METABOLIC PANEL
ALT: 19 U/L (ref 17–63)
AST: 27 U/L (ref 15–41)
Albumin: 4.2 g/dL (ref 3.5–5.0)
Alkaline Phosphatase: 56 U/L (ref 38–126)
Anion gap: 8 (ref 5–15)
BUN: 10 mg/dL (ref 6–20)
CO2: 22 mmol/L (ref 22–32)
Calcium: 8.9 mg/dL (ref 8.9–10.3)
Chloride: 107 mmol/L (ref 101–111)
Creatinine, Ser: 0.85 mg/dL (ref 0.61–1.24)
GFR calc Af Amer: >60 mL/min (ref >60)
GFR calc non Af Amer: >60 mL/min (ref >60)
Glucose, Bld: 87 mg/dL (ref 65–99)
Potassium: 4.1 mmol/L (ref 3.5–5.1)
Sodium: 137 mmol/L (ref 135–145)
Total Bilirubin: 0.4 mg/dL (ref 0.3–1.2)
Total Protein: 7.5 g/dL (ref 6.5–8.1)

## 2016-10-31 LAB — ACETAMINOPHEN LEVEL: Acetaminophen (Tylenol), Serum: <10 ug/mL — ABNORMAL LOW (ref 10–30)

## 2016-10-31 LAB — SALICYLATE LEVEL: Salicylate Lvl: <7 mg/dL (ref 2.8–30.0)

## 2016-10-31 LAB — ETHANOL: Alcohol, Ethyl (B): 183 mg/dL — ABNORMAL HIGH (ref <5)

## 2016-10-31 MED ORDER — MAGNESIUM HYDROXIDE 400 MG/5ML PO SUSP: 30.0000 mL | Freq: Every day | ORAL | Status: DC | PRN

## 2016-10-31 MED ORDER — LORAZEPAM 1 MG PO TABS: 0.0000 mg | ORAL_TABLET | Freq: Two times a day (BID) | ORAL | Status: DC

## 2016-10-31 MED ORDER — ONDANSETRON HCL 4 MG PO TABS: 4.0000 mg | ORAL_TABLET | Freq: Three times a day (TID) | ORAL | Status: DC | PRN

## 2016-10-31 MED ORDER — ONDANSETRON 4 MG PO TBDP: 4.0000 mg | ORAL_TABLET | Freq: Four times a day (QID) | ORAL | Status: DC | PRN

## 2016-10-31 MED ORDER — TRAZODONE HCL 50 MG PO TABS
50.0000 mg | ORAL_TABLET | Freq: Every evening | ORAL | Status: DC | PRN
  Administered 2016-10-31: 50 mg via ORAL
  Filled 2016-10-31: qty 1

## 2016-10-31 MED ORDER — LORAZEPAM 1 MG PO TABS
0.0000 mg | ORAL_TABLET | Freq: Four times a day (QID) | ORAL | Status: DC
  Administered 2016-10-31: 1 mg via ORAL
  Filled 2016-10-31: qty 1

## 2016-10-31 MED ORDER — ADULT MULTIVITAMIN W/MINERALS CH
1.0000 | ORAL_TABLET | Freq: Every day | ORAL | Status: DC
  Administered 2016-11-01: 1 via ORAL
  Filled 2016-10-31: qty 1

## 2016-10-31 MED ORDER — VITAMIN B-1 100 MG PO TABS
100.0000 mg | ORAL_TABLET | Freq: Every day | ORAL | Status: DC
  Administered 2016-10-31: 100 mg via ORAL
  Filled 2016-10-31: qty 1

## 2016-10-31 MED ORDER — ALUM & MAG HYDROXIDE-SIMETH 200-200-20 MG/5ML PO SUSP: 30.0000 mL | ORAL | Status: DC | PRN

## 2016-10-31 MED ORDER — LORAZEPAM 1 MG PO TABS: 1.0000 mg | ORAL_TABLET | Freq: Three times a day (TID) | ORAL | Status: DC

## 2016-10-31 MED ORDER — IBUPROFEN 200 MG PO TABS: 600.0000 mg | ORAL_TABLET | Freq: Three times a day (TID) | ORAL | Status: DC | PRN

## 2016-10-31 MED ORDER — LOPERAMIDE HCL 2 MG PO CAPS: 2.0000 mg | ORAL_CAPSULE | ORAL | Status: DC | PRN

## 2016-10-31 MED ORDER — ACETAMINOPHEN 325 MG PO TABS: 650.0000 mg | ORAL_TABLET | Freq: Four times a day (QID) | ORAL | Status: DC | PRN

## 2016-10-31 MED ORDER — THIAMINE HCL 100 MG/ML IJ SOLN: 100.0000 mg | Freq: Once | INTRAMUSCULAR | Status: DC

## 2016-10-31 MED ORDER — VITAMIN B-1 100 MG PO TABS
100.0000 mg | ORAL_TABLET | Freq: Every day | ORAL | Status: DC
  Administered 2016-11-01: 100 mg via ORAL
  Filled 2016-10-31: qty 1

## 2016-10-31 MED ORDER — NICOTINE 21 MG/24HR TD PT24
21.0000 mg | MEDICATED_PATCH | Freq: Every day | TRANSDERMAL | Status: DC
  Administered 2016-10-31: 21 mg via TRANSDERMAL
  Filled 2016-10-31: qty 1

## 2016-10-31 MED ORDER — LORAZEPAM 1 MG PO TABS: 1.0000 mg | ORAL_TABLET | Freq: Every day | ORAL | Status: DC

## 2016-10-31 MED ORDER — LORAZEPAM 1 MG PO TABS
1.0000 mg | ORAL_TABLET | Freq: Four times a day (QID) | ORAL | Status: DC
  Administered 2016-10-31 – 2016-11-01 (×2): 1 mg via ORAL
  Filled 2016-10-31 (×2): qty 1

## 2016-10-31 MED ORDER — HYDROXYZINE HCL 25 MG PO TABS: 25.0000 mg | ORAL_TABLET | Freq: Four times a day (QID) | ORAL | Status: DC | PRN

## 2016-10-31 MED ORDER — THIAMINE HCL 100 MG/ML IJ SOLN: 100.0000 mg | Freq: Every day | INTRAMUSCULAR | Status: DC

## 2016-10-31 MED ORDER — LORAZEPAM 1 MG PO TABS: 1.0000 mg | ORAL_TABLET | Freq: Two times a day (BID) | ORAL | Status: DC

## 2016-10-31 MED ORDER — ZOLPIDEM TARTRATE 5 MG PO TABS: 5.0000 mg | ORAL_TABLET | Freq: Every evening | ORAL | Status: DC | PRN

## 2016-10-31 MED ORDER — LORAZEPAM 1 MG PO TABS: 1.0000 mg | ORAL_TABLET | Freq: Four times a day (QID) | ORAL | Status: DC | PRN

## 2016-10-31 MED ORDER — ACETAMINOPHEN 325 MG PO TABS
650.0000 mg | ORAL_TABLET | ORAL | Status: DC | PRN
Start: 2016-10-31 — End: 2016-10-31

## 2016-10-31 NOTE — Progress Notes (Signed)
10/31/16 1410:  LRT offered activities to pt and told him what activities were available to play, pt wanted to play checkers.  LRT played three games of checkers with pt.  Pt was bright and very social.  Pt was cracking jokes, trash talking and asking about recreation therapy.  Pt was also engaged throughout group.  Caroll Rancher, LRT/CTRS

## 2016-10-31 NOTE — Progress Notes (Signed)
Pt confirms no [employer] pcp  Pt requested coffee ED RN states pt can have coffee offered him coffee, sugar and cream Pt talkative

## 2016-10-31 NOTE — ED Triage Notes (Signed)
Patient here requesting detox from alcohol and cocaine. Reports last use right before checking in. SI with plan to shoot self.

## 2016-10-31 NOTE — ED Provider Notes (Signed)
[facility]-EMERGENCY DEPT Provider Note   CSN: [id number] Arrival date & time: 10/31/16  1121     History   Chief Complaint Chief Complaint  Patient presents with  . Suicidal    Detox    HPI [name] [name] is a 55 y.o. male with a PMHx of drug and alcohol abuse, who presents to the ED with complaints of suicidal ideations with a plan to shoot himself, occasional auditory hallucinations hearing voices, and requesting detox from crack cocaine and alcohol. He last used both just prior to arrival, had three 20 ounce beers and about $100 worth of cocaine just PTA. admits to being a cigarette smoker. Denies HI or visual hallucinations. Denies any other medical complaints at this time. He is here voluntarily requesting help.   The history is provided by the patient and medical records. No language interpreter was used.  Mental Health Problem  Presenting symptoms: hallucinations and suicidal thoughts   Presenting symptoms: no homicidal ideas   Onset quality:  Gradual Timing:  Constant Progression:  Worsening Chronicity:  Recurrent Context: alcohol use and drug abuse   Treatment compliance:  Untreated Relieved by:  None tried Worsened by:  Alcohol and drugs Ineffective treatments:  None tried Associated symptoms: no abdominal pain and no chest pain     Past Medical History:  Diagnosis Date  . Alcohol abuse   . Drug abuse     There are no active problems to display for this patient.   History reviewed. No pertinent surgical history.     Home Medications    Prior to Admission medications   Not on File    Family History History reviewed. No pertinent family history.  Social History Social History  Substance Use Topics  . Smoking status: Current Every Day Smoker  . Smokeless tobacco: Never Used  . Alcohol use 1.8 oz/week    3 Cans of beer per week     Comment: daily     Allergies   Patient has no known allergies.   Review of Systems Review of Systems    Constitutional: Negative for chills and fever.  Respiratory: Negative for shortness of breath.   Cardiovascular: Negative for chest pain.  Gastrointestinal: Negative for abdominal pain, constipation, diarrhea, nausea and vomiting.  Genitourinary: Negative for dysuria and hematuria.  Musculoskeletal: Negative for arthralgias and myalgias.  Skin: Negative for color change.  Allergic/Immunologic: Negative for immunocompromised state.  Neurological: Negative for weakness and numbness.  Psychiatric/Behavioral: Positive for hallucinations and suicidal ideas. Negative for confusion and homicidal ideas.   10 Systems reviewed and are negative for acute change except as noted in the HPI.   Physical Exam Updated Vital Signs BP 123/80 (BP Location: Right Arm)   Pulse 87   Temp 98.5 F (36.9 C) (Oral)   Resp 17   SpO2 98%   Physical Exam  Constitutional: He is oriented to person, place, and time. Vital signs are normal. He appears well-developed and well-nourished.  Non-toxic appearance. No distress.  Afebrile, nontoxic, NAD  HENT:  Head: Normocephalic and atraumatic.  Mouth/Throat: Oropharynx is clear and moist and mucous membranes are normal.  Eyes: Conjunctivae and EOM are normal. Right eye exhibits no discharge. Left eye exhibits no discharge.  Neck: Normal range of motion. Neck supple.  Cardiovascular: Normal rate, regular rhythm, normal heart sounds and intact distal pulses.  Exam reveals no gallop and no friction rub.   No murmur heard. Pulmonary/Chest: Effort normal and breath sounds normal. No respiratory distress. He has no  decreased breath sounds. He has no wheezes. He has no rhonchi. He has no rales.  Abdominal: Soft. Normal appearance and bowel sounds are normal. He exhibits no distension. There is no tenderness. There is no rigidity, no rebound, no guarding, no CVA tenderness, no tenderness at McBurney's point and negative Murphy's sign.  Musculoskeletal: Normal range of motion.   Neurological: He is alert and oriented to person, place, and time. He has normal strength. No sensory deficit.  Skin: Skin is warm, dry and intact. No rash noted.  Psychiatric: He is actively hallucinating. He exhibits a depressed mood. He expresses suicidal ideation. He expresses no homicidal ideation. He expresses no suicidal plans and no homicidal plans.  Depressed affect, endorsing SI without plan, states he has occasional auditory hallucinations, denies HI/VH  Nursing note and vitals reviewed.    ED Treatments / Results  Labs (all labs ordered are listed, but only abnormal results are displayed) Labs Reviewed  ETHANOL - Abnormal; Notable for the following:       Result Value   Alcohol, Ethyl (B) 183 (*)    All other components within normal limits  ACETAMINOPHEN LEVEL - Abnormal; Notable for the following:    Acetaminophen (Tylenol), Serum <10 (*)    All other components within normal limits  CBC - Abnormal; Notable for the following:    RBC 4.09 (*)    All other components within normal limits  RAPID URINE DRUG SCREEN, HOSP PERFORMED - Abnormal; Notable for the following:    Cocaine POSITIVE (*)    All other components within normal limits  COMPREHENSIVE METABOLIC PANEL  SALICYLATE LEVEL    EKG  EKG Interpretation None       Radiology No results found.  Procedures Procedures (including critical care time)  Medications Ordered in ED Medications  LORazepam (ATIVAN) tablet 0-4 mg (1 mg Oral Given 10/31/16 1315)    Followed by  LORazepam (ATIVAN) tablet 0-4 mg (not administered)  thiamine (VITAMIN B-1) tablet 100 mg (100 mg Oral Given 10/31/16 1314)    Or  thiamine (B-1) injection 100 mg ( Intravenous See Alternative 10/31/16 1314)  alum & mag hydroxide-simeth (MAALOX/MYLANTA) 200-200-20 MG/5ML suspension 30 mL (not administered)  ondansetron (ZOFRAN) tablet 4 mg (not administered)  nicotine (NICODERM CQ - dosed in mg/24 hours) patch 21 mg (21 mg Transdermal Patch  Applied 10/31/16 1315)  zolpidem (AMBIEN) tablet 5 mg (not administered)  ibuprofen (ADVIL,MOTRIN) tablet 600 mg (not administered)  acetaminophen (TYLENOL) tablet 650 mg (not administered)     Initial Impression / Assessment and Plan / ED Course  I have reviewed the triage vital signs and the nursing notes.  Pertinent labs & imaging results that were available during my care of the patient were reviewed by me and considered in my medical decision making (see chart for details).  Clinical Course     55 y.o. male here with SI without a plan, and requesting detox from cocaine and alcohol. Occasional AH. Denies HI/VH. +Cigarette smoker, smoking cessation encouraged. EtOH 183. CBC, CMP, Salicylate, and acetaminophen levels WNL. UDS with +cocaine. Pt medically cleared at this time. Psych hold orders and home med orders placed. Please see TTS notes for further documentation of care/dispo. PLEASE NOTE THAT PT IS HERE VOLUNTARILY AT THIS TIME, IF PT TRIES TO LEAVE THEY WOULD NEED IVC PAPERWORK TAKEN OUT. Pt stable at time of med clearance.    Final Clinical Impressions(s) / ED Diagnoses   Final diagnoses:  Suicidal ideation  Polysubstance abuse  Alcohol  use  Tobacco use  Auditory hallucinations    New Prescriptions New Prescriptions   No medications on file       Darcell Yacoub Camprubi-Soms, PA-C 10/31/16 1328    Courteney Lyn Mackuen, MD 11/02/16 [phone]

## 2016-10-31 NOTE — Progress Notes (Signed)
ADMISSION NOTE [name] [name] admitted to OBS bed 5 from [facility].  Report from Coleta C received.  Pt is 55 yo AAM that presents with MDD single episode, Substance Induced mood D/O, Alcohol, Cocaine use D/O. Pt denies pain or discomfort at this time.  Pt also denies any withdrawal symptoms.  Pt is quiet but answers all questions.  Pt is polite an cooperative.  Pt sts he is using Alcohol ( 7-8 forty oz beers and smoking $20 to $80 in crack cocaine.  Pt sts he has passive SI when intoxicated but says he is too afraid to hurt himself.  Pt verbally contracts for safety.  Pt sts he had good results at [facility] with a 5 month stay.  Pt goal is to detox, get into an inpatient program and medication management. Pt sts he is hungry. Pt receives meal and drink.  Pt orders to include sleep medication. Pt is continuously observed for safety except when in the bathroom.  Pt remains safe.

## 2016-10-31 NOTE — ED Provider Notes (Signed)
Accepted to [facility]. Dr. Lucianne Muss is accepting   Pricilla Loveless, MD 10/31/16 [phone]

## 2016-10-31 NOTE — Progress Notes (Signed)
Patient presents with depressed affect and voices feelings of hopelessness during admission interview and assessment. Pt denies SI/HI but states that "his lifestyle is going to kill him" and "something's gotta change". Pt reports his recent loss of employment caused him to relapse into alcohol/drug abuse. Pt reports having stayed at [facility] in the past and having a positive result (clean for over a year).Pt denies A/V hallucinations at this time. PO fluids provided. Safety maintained.

## 2016-10-31 NOTE — Progress Notes (Signed)
Patient stated that he has a history of crack and alcohol abuse. He has been inpatient at [facility] in the past and stated that he had the best results from being a patient with their facility. This Clinical research associate called and spoke with Toya at [facility] to check bed availability. This facility has beds available. This Clinical research associate will fax patient info per patients permission (signed ROI form) and call facility or wait for call from RN on duty.

## 2016-10-31 NOTE — ED Notes (Signed)
Patient belongings:  2 black duffle bags Clothing 1 [employer] black cell phone $7.09 cash 8 cigaretts and lighter

## 2016-10-31 NOTE — Progress Notes (Addendum)
Pt given a 7 page list of [employer] in network pcp internal medicine and placed in locker #27   entered in d/c instructions Please use the resources provided to you in emergency room by case manager to assist you're your choice of doctor for follow up     These in network [employer] providers provide possible primary care providers, listed near your zip code [address]    Next Steps: Schedule an appointment as soon as possible for a visit

## 2016-10-31 NOTE — Progress Notes (Signed)
Pt transferred to observation unit via [facility].  Pt is in stable condition and he is ambulatory.  He denies SI/HI at this time, denies hallucinations, denies pain, denies withdrawal symptoms.  Pt denies needs and concerns at time of departure.  Report given to [facility].

## 2016-10-31 NOTE — BH Assessment (Signed)
[facility] Assessment Progress Note  Per Nanine Means, DNP, this pt would benefit from admission to the [facility] Observation Unit at this time.  Lillia Abed, RN, [facility] has assigned pt to Obs 4; they will be ready to receive pt at 19:00.  Pt has signed Voluntary Admission and Consent for Treatment, as well as Consent to Release Information to no one, and signed forms have been faxed to [facility].  Pt's nurse has been notified, and agrees to send original paperwork along with pt via Juel Burrow, and to call report to [phone] or [phone].  Doylene Canning, MA Triage Specialist [phone]

## 2016-10-31 NOTE — BH Assessment (Addendum)
Assessment Note  [name] [name] is an 55 y.o. male with history of alcohol and drug use. Patient presents to [facility] voluntarily for help with suicidal thoughts and substance use. Patient was self referred. Patient reports passive suicidal thoughts today. He does not have a clear suicide plan. Patient with hesitancy sts that in the past he thought of (shooting self in the head, jumping off a cliff, etc.) but never would do these things to himself. No suicide intent. Patient denies access to firearms. No past history of suicide attempts/gestures. No self mutilating behaviors. He does report a long history of depressive symptoms including loss of interest in usual pleasures, fatigue, crying spells, isolating self from others, etc. He denies issues with anxiety. He denies HI. No history of violent or aggressive behaviors. No current legal issues. Patient admits that he has spent a lot of time in/out of prison. He denies current auditory hallucinations. Sts that in the past when he uses drugs he hears voices. He explains that the voices tell him to hurt people but he would never act on those hallucinations. No visual hallucinations. Patient reports a long history of crack cocaine and alcohol use. SEE ADDITIONAL HISTORY section for details related to substance use. Patient does not have a psychiatrist or therapist. He has participated in several treatment programs in the past 7 yrs: [facility], [facility], [facility], and [facility].     Diagnosis: Major Depressive Disorder, Single Episode, Severe, without psychotic features; Substance Induced Mood Disorder; Cocaine Use, Severe; Alcohol Use, Severe  Past Medical History:  Past Medical History:  Diagnosis Date  . Alcohol abuse   . Drug abuse     History reviewed. No pertinent surgical history.  Family History: History reviewed. No pertinent family history.  Social History:  reports that he has been smoking.  He has never used smokeless tobacco. He reports that he drinks about  1.8 oz of alcohol per week . He reports that he uses drugs, including Marijuana.  Additional Social History:  Alcohol / Drug Use Pain Medications: SEE MAR Prescriptions: SEE MAR Over the Counter: SEE MAR History of alcohol / drug use?: Yes Substance #1 Name of Substance 1: crack cocaine  1 - Age of First Use: 55 yrs old  1 - Amount (size/oz): $20-$100 1 - Frequency: daily  1 - Duration: on-going  1 - Last Use / Amount: 10/31/2016; $20 worth of crack cocaine  Substance #2 Name of Substance 2: Alcohol (liqour and beer) 2 - Age of First Use: 55 yrs old  2 - Amount (size/oz): #8-12/ 25 ounce beers 2 - Frequency: daily  2 - Duration: on-going  2 - Last Use / Amount: 10/31/2016; #4 25 ounce beers  CIWA: CIWA-Ar BP: 123/80 Pulse Rate: 88 Nausea and Vomiting: no nausea and no vomiting Tactile Disturbances: very mild itching, pins and needles, burning or numbness Tremor: not visible, but can be felt fingertip to fingertip Auditory Disturbances: not present Paroxysmal Sweats: barely perceptible sweating, palms moist Visual Disturbances: not present Anxiety: mildly anxious Headache, Fullness in Head: none present Agitation: somewhat more than normal activity Orientation and Clouding of Sensorium: oriented and can do serial additions CIWA-Ar Total: 5 COWS:    Allergies: No Known Allergies  Home Medications:  (Not in a hospital admission)  OB/GYN Status:  No LMP for male patient.  General Assessment Data Location of Assessment: [facility] ED TTS Assessment: In system Is this a Tele or Face-to-Face Assessment?: Face-to-Face Is this an Initial Assessment or a Re-assessment for this  encounter?: Initial Assessment Marital status: Single Maiden name:  (n/a) Is patient pregnant?: No Pregnancy Status: No Living Arrangements: Other (Comment) (with mother occasionally; at time homeless) Can pt return to current living arrangement?: Yes Admission Status: Voluntary Is patient capable of  signing voluntary admission?: No Referral Source: Self/Family/Friend Insurance type:  (Self Pay )     Crisis Care Plan Living Arrangements: Other (Comment) (with mother occasionally; at time homeless) Legal Guardian: Other: (no legal guardian ) Name of Psychiatrist:  (no psychiatrist ) Name of Therapist:  (no therapist )  Education Status Is patient currently in school?:  (n/a) Current Grade:  (n/a) Highest grade of school patient has completed:  (some college ) Name of school:  (n/a) Contact person:  (n/a)  Risk to self with the past 6 months Suicidal Ideation: Yes-Currently Present Has patient been a risk to self within the past 6 months prior to admission? : Yes Suicidal Intent: Yes-Currently Present Has patient had any suicidal intent within the past 6 months prior to admission? : Yes Is patient at risk for suicide?: Yes Suicidal Plan?: Yes-Currently Present Has patient had any suicidal plan within the past 6 months prior to admission? : Yes Specify Current Suicidal Plan:  ("I've thought of doing things like shooting self, jumping of) Access to Means: Yes (access to bridges) Specify Access to Suicidal Means:  (access to bridges; no firearms) What has been your use of drugs/alcohol within the last 12 months?:  ("mostly beer") Previous Attempts/Gestures: No How many times?:  (n/a) Other Self Harm Risks:  (denies ) Intentional Self Injurious Behavior: None Family Suicide History: No Recent stressful life event(s): Other (Comment) (no steady place to stay and "I can't stop using drugs.Marland Kitchenalcoh) Persecutory voices/beliefs?: No Depression: Yes Depression Symptoms: Feeling angry/irritable, Feeling worthless/self pity, Loss of interest in usual pleasures, Fatigue, Isolating Substance abuse history and/or treatment for substance abuse?: No Suicide prevention information given to non-admitted patients: Not applicable  Risk to Others within the past 6 months Homicidal Ideation:  No Does patient have any lifetime risk of violence toward others beyond the six months prior to admission? : No Thoughts of Harm to Others: No Current Homicidal Intent: No Current Homicidal Plan: No Access to Homicidal Means: No Identified Victim:  (n/a) History of harm to others?: No Assessment of Violence: None Noted Violent Behavior Description:  (n/a) Does patient have access to weapons?: No Criminal Charges Pending?: No (no current charges; has a hx of spending time in prison ) Does patient have a court date: No Is patient on probation?: No  Psychosis Hallucinations: Auditory ("When I use crack I hear voices telling me to harm others") Delusions: None noted  Mental Status Report Appearance/Hygiene: Disheveled, In scrubs Eye Contact: Good Motor Activity: Freedom of movement Speech: Logical/coherent Level of Consciousness: Alert Mood: Depressed Affect: Depressed Anxiety Level: None Thought Processes: Coherent, Relevant Judgement: Impaired Orientation: Person, Place, Time, Situation Obsessive Compulsive Thoughts/Behaviors: None  Cognitive Functioning Concentration: Decreased Memory: Recent Intact, Remote Intact IQ: Average Insight: Poor Impulse Control: Poor Appetite: Fair Weight Loss:  (none reported) Weight Gain:  (none reported) Sleep: No Change Total Hours of Sleep:  (n/a) Vegetative Symptoms: None  ADLScreening [facility] Assessment Services) Patient's cognitive ability adequate to safely complete daily activities?: Yes Patient able to express need for assistance with ADLs?: No Independently performs ADLs?: Yes (appropriate for developmental age)  Prior Inpatient Therapy Prior Inpatient Therapy: Yes Prior Therapy Dates:  (over the past 7 yrs patient has participated in detox/rehab ) Prior Therapy  Facilty/Provider(s):  ([facility], [facility], [facility], [facility]-over the past 7 yrs ) Reason for Treatment:  (substance abuse and depression )  Prior Outpatient Therapy Prior  Outpatient Therapy: No Prior Therapy Dates:  (n/a) Prior Therapy Facilty/Provider(s):  (n/a) Reason for Treatment: n/a Does patient have an ACCT team?: No Does patient have Intensive In-House Services?  : No Does patient have [facility] services? : No Does patient have P4CC services?: No  ADL Screening (condition at time of admission) Patient's cognitive ability adequate to safely complete daily activities?: Yes Is the patient deaf or have difficulty hearing?: No Does the patient have difficulty seeing, even when wearing glasses/contacts?: No Does the patient have difficulty concentrating, remembering, or making decisions?: Yes Patient able to express need for assistance with ADLs?: No Does the patient have difficulty dressing or bathing?: No Independently performs ADLs?: Yes (appropriate for developmental age) Does the patient have difficulty walking or climbing stairs?: No Weakness of Legs: None Weakness of Arms/Hands: None  Home Assistive Devices/Equipment Home Assistive Devices/Equipment: None    Abuse/Neglect Assessment (Assessment to be complete while patient is alone) Physical Abuse: Denies Verbal Abuse: Denies Sexual Abuse: Denies Exploitation of patient/patient's resources: Denies Self-Neglect: Denies Values / Beliefs Cultural Requests During Hospitalization: None Spiritual Requests During Hospitalization: None   Advance Directives (For Healthcare) Does patient have an advance directive?: No Would patient like information on creating an advanced directive?: No - patient declined information Nutrition Screen- [facility] Adult/[facility] Patient's home diet: Regular  Additional Information 1:1 In Past 12 Months?: No CIRT Risk: No Elopement Risk: No Does patient have medical clearance?: Yes     Disposition: Per Nanine MeansJamison Lord, DNP, [facility] has [facility] patient to OBS bed #4 after 7pm. [facility]. Made aware and will complete support  paperwork for this patient.  Disposition Initial Assessment Completed for this Encounter: Yes  On Site Evaluation by:   Reviewed with Physician:    Melynda Rippleoyka Kayin Kettering 10/31/2016 2:37 PM

## 2016-11-01 DIAGNOSIS — F1994 Other psychoactive substance use, unspecified with psychoactive substance-induced mood disorder: Secondary | ICD-10-CM

## 2016-11-01 DIAGNOSIS — Z79899 Other long term (current) drug therapy: Secondary | ICD-10-CM

## 2016-11-01 MED ORDER — ADULT MULTIVITAMIN W/MINERALS CH: 1.0000 | ORAL_TABLET | Freq: Every day | ORAL | Status: DC

## 2016-11-01 NOTE — Progress Notes (Signed)
D:  Patient awake and alert; oriented x 4; he denies suicidal and homicidal ideation and AVH; no self-injurous behaviors noted or reported. A:  Emotional support provided; encouraged him to seek assistance with needs/concerns. R:  Safety maintained on unit.

## 2016-11-01 NOTE — Discharge Planning (Signed)
[facility] Observation Unit Case Management Discharge Plan :  Will you be returning to the same living situation after discharge:  No.  At discharge, do you have transportation home?: Yes,  Transportation will be provided  Do you have the ability to pay for your medications: No.  Release of information consent forms completed and in the chart;  Patient's signature needed at discharge.  Patient to Follow up at: Follow-up Information    [facility]. Go on 11/01/2016.   Contact information: [address] [name] [address] [phone]           Safety Planning and Suicide Prevention discussed: Yes,  Patient verbalized understanding   Camelia Eng 11/01/2016, 9:48 AM

## 2016-11-01 NOTE — Progress Notes (Signed)
Written/verbal discharge instructions and follow-up information given to patient with verbalization of understanding;  Patient denies suicidal and homicidal ideation. Suicide Prevention information/materials given to patient  All patient belongings returned to patient at time of discharge. Discharged to [facility] in stable condition. Transported to [facility] by [employer].

## 2016-11-01 NOTE — Progress Notes (Signed)
Observation unit staff contacted "[name]" to arrive at 4:45pm to pick of pt from [name] where "[name]" will transport pt to [name] located at [name], [name], [name]. [name] [name] 11/01/16

## 2016-11-01 NOTE — Progress Notes (Signed)
[facility] OBSERVATION UNIT:  Family/Significant Other Suicide Prevention Education  Suicide Prevention Education:  Patient Discharged to Other Healthcare Facility:  Suicide Prevention Education Not Provided: {PT. DISCHARGED TO OTHER HEALTHCARE FACILITY:SUICIDE PREVENTION EDUCATION NOT PROVIDED ([facility]):  The patient is discharging to another healthcare facility for continuation of treatment.  The patient's medical information, including suicide ideations and risk factors, are a part of the medical information shared with the receiving healthcare facility.  Camelia Eng 11/01/2016, 9:41 AM   Camelia Eng, RN 11/01/16  9:41 AM

## 2016-11-01 NOTE — H&P (Signed)
[place] [facility] [facility] Provider Admission PAA/H&P  Patient Identification: [name] [name] MRN:  [id number] Date of Evaluation:  11/01/2016 Chief Complaint:  Patient states "My use of crack and alcohol had gotten out of control."  Principal Diagnosis: Substance induced mood disorder ([place]) Diagnosis:   Patient Active Problem List   Diagnosis Date Noted  . Substance induced mood disorder [facility]) [F19.94] 10/31/2016   History of Present Illness:   Per initial Tele Assessment note from [place] on 10/31/2016:   [name] [name] is an 55 y.o. male with history of alcohol and drug use. Patient presents to [facility] voluntarily for help with suicidal thoughts and substance use. Patient was self referred. Patient reports passive suicidal thoughts today. He does not have a clear suicide plan. Patient with hesitancy sts that in the past he thought of (shooting self in the head, jumping off a cliff, etc.) but never would do these things to himself. No suicide intent. Patient denies access to firearms. No past history of suicide attempts/gestures. No self mutilating behaviors. He does report a long history of depressive symptoms including loss of interest in usual pleasures, fatigue, crying spells, isolating self from others, etc. He denies issues with anxiety. He denies HI. No history of violent or aggressive behaviors. No current legal issues. Patient admits that he has spent a lot of time in/out of prison. He denies current auditory hallucinations. Sts that in the past when he uses drugs he hears voices. He explains that the voices tell him to hurt people but he would never act on those hallucinations. No visual hallucinations. Patient reports a long history of crack cocaine and alcohol use. SEE ADDITIONAL HISTORY section for details related to substance use. Patient does not have a psychiatrist or therapist. He has participated in several treatment programs in the past 7 yrs: [facility], [facility], [facility], and [facility].   Patient was  monitored overnight in the [place] [facility]. He was accepted to [facility] for residential treatment program. Patient scheduled to leave the Observation Unit around 5:30 pm on 11/01/2016. He denied any suicidal thoughts. [name] [name] reports no previous psych history other than substance abuse. The patient reported feeling positive about getting treatment. There were no active signs or symptoms of withdrawal. He was started on the ativan taper. The patient did not reported being on any medications prior to his admission. He was found stable to discharge to [facility] on 11/01/2016.   Associated Signs/Symptoms: Depression Symptoms:  depressed mood, anhedonia, anxiety, (Hypo) Manic Symptoms:  Denies Anxiety Symptoms:  Denies Psychotic Symptoms:  Denies  PTSD Symptoms: Negative Total Time spent with patient: 30 minutes  Past Psychiatric History: Polysubstance abuse   Is the patient at risk to self? No.  Has the patient been a risk to self in the past 6 months? No.  Has the patient been a risk to self within the distant past? No.  Is the patient a risk to others? No.  Has the patient been a risk to others in the past 6 months? No.  Has the patient been a risk to others within the distant past? No.   Prior Inpatient Therapy:   Prior Outpatient Therapy:    Alcohol Screening:   Substance Abuse History in the last 12 months:  Yes.   Consequences of Substance Abuse: Substance abuse causing mood symptoms  Previous Psychotropic Medications: No  Psychological Evaluations: No  Past Medical History:  Past Medical History:  Diagnosis Date  . Alcohol abuse   . Drug abuse    History  reviewed. No pertinent surgical history. Family History: History reviewed. No pertinent family history. Family Psychiatric History: Reports mother struggled with alcohol abuse  Tobacco Screening:   Social History:  History  Alcohol Use  . 1.8 oz/week  . 3 Cans of beer per week    Comment: daily     History  Drug Use  .  Types: Marijuana    Comment: crack    Additional Social History:      Pain Medications: SEE MAR Prescriptions: SEE MAR Over the Counter: SEE MAR History of alcohol / drug use?: Yes Longest period of sobriety (when/how long): pt cannot recall other than 5 month period at [facility] Negative Consequences of Use: Financial, Personal relationships Withdrawal Symptoms: Tremors, Nausea / Vomiting, DTs Name of Substance 1: crack cocaine  1 - Age of First Use: 55 yrs old  1 - Amount (size/oz): $20-$100 1 - Frequency: daily  1 - Duration: on-going  1 - Last Use / Amount: 10/31/2016; $20 worth of crack cocaine  Name of Substance 2: Alcohol (liqour and beer) 2 - Age of First Use: 55 yrs old  2 - Amount (size/oz): #8-12/ 25 ounce beers 2 - Frequency: daily  2 - Duration: on-going  2 - Last Use / Amount: 10/31/2016; #4 25 ounce beers                Allergies:  No Known Allergies Lab Results:  Results for orders placed or performed during the hospital encounter of 10/31/16 (from the past 48 hour(s))  Rapid urine drug screen (hospital performed)     Status: Abnormal   Collection Time: 10/31/16 12:01 PM  Result Value Ref Range   Opiates NONE DETECTED NONE DETECTED   Cocaine POSITIVE (A) NONE DETECTED   Benzodiazepines NONE DETECTED NONE DETECTED   Amphetamines NONE DETECTED NONE DETECTED   Tetrahydrocannabinol NONE DETECTED NONE DETECTED   Barbiturates NONE DETECTED NONE DETECTED    Comment:        DRUG SCREEN FOR MEDICAL PURPOSES ONLY.  IF CONFIRMATION IS NEEDED FOR ANY PURPOSE, NOTIFY LAB WITHIN 5 DAYS.        LOWEST DETECTABLE LIMITS FOR URINE DRUG SCREEN Drug Class       Cutoff (ng/mL) Amphetamine      1000 Barbiturate      200 Benzodiazepine   756 Tricyclics       433 Opiates          300 Cocaine          300 THC              50   Comprehensive metabolic panel     Status: None   Collection Time: 10/31/16 12:04 PM  Result Value Ref Range   Sodium 137 135 - 145 mmol/L    Potassium 4.1 3.5 - 5.1 mmol/L   Chloride 107 101 - 111 mmol/L   CO2 22 22 - 32 mmol/L   Glucose, Bld 87 65 - 99 mg/dL   BUN 10 6 - 20 mg/dL   Creatinine, Ser 0.85 0.61 - 1.24 mg/dL   Calcium 8.9 8.9 - 10.3 mg/dL   Total Protein 7.5 6.5 - 8.1 g/dL   Albumin 4.2 3.5 - 5.0 g/dL   AST 27 15 - 41 U/L   ALT 19 17 - 63 U/L   Alkaline Phosphatase 56 38 - 126 U/L   Total Bilirubin 0.4 0.3 - 1.2 mg/dL   GFR calc non Af Amer >60 >60 mL/min   GFR calc  Af Amer >60 >60 mL/min    Comment: (NOTE) The eGFR has been calculated using the CKD EPI equation. This calculation has not been validated in all clinical situations. eGFR's persistently <60 mL/min signify possible Chronic Kidney Disease.    Anion gap 8 5 - 15  Ethanol     Status: Abnormal   Collection Time: 10/31/16 12:04 PM  Result Value Ref Range   Alcohol, Ethyl (B) 183 (H) <5 mg/dL    Comment:        LOWEST DETECTABLE LIMIT FOR SERUM ALCOHOL IS 5 mg/dL FOR MEDICAL PURPOSES ONLY   Salicylate level     Status: None   Collection Time: 10/31/16 12:04 PM  Result Value Ref Range   Salicylate Lvl <9.6 2.8 - 30.0 mg/dL  Acetaminophen level     Status: Abnormal   Collection Time: 10/31/16 12:04 PM  Result Value Ref Range   Acetaminophen (Tylenol), Serum <10 (L) 10 - 30 ug/mL    Comment:        THERAPEUTIC CONCENTRATIONS VARY SIGNIFICANTLY. A RANGE OF 10-30 ug/mL MAY BE AN EFFECTIVE CONCENTRATION FOR MANY PATIENTS. HOWEVER, SOME ARE BEST TREATED AT CONCENTRATIONS OUTSIDE THIS RANGE. ACETAMINOPHEN CONCENTRATIONS >150 ug/mL AT 4 HOURS AFTER INGESTION AND >50 ug/mL AT 12 HOURS AFTER INGESTION ARE OFTEN ASSOCIATED WITH TOXIC REACTIONS.   cbc     Status: Abnormal   Collection Time: 10/31/16 12:04 PM  Result Value Ref Range   WBC 8.3 4.0 - 10.5 K/uL   RBC 4.09 (L) 4.22 - 5.81 MIL/uL   Hemoglobin 13.5 13.0 - 17.0 g/dL   HCT 39.2 39.0 - 52.0 %   MCV 95.8 78.0 - 100.0 fL   MCH 33.0 26.0 - 34.0 pg   MCHC 34.4 30.0 - 36.0 g/dL   RDW  12.9 11.5 - 15.5 %   Platelets 313 150 - 400 K/uL    Blood Alcohol level:  Lab Results  Component Value Date   ETH 183 (H) 10/31/2016   ETH <11 22/29/7989    Metabolic Disorder Labs:  Lab Results  Component Value Date   HGBA1C 5.6 12/14/2013   No results found for: PROLACTIN No results found for: CHOL, TRIG, HDL, CHOLHDL, VLDL, LDLCALC  Current Medications: Current Facility-Administered Medications  Medication Dose Route Frequency Provider Last Rate Last Dose  . acetaminophen (TYLENOL) tablet 650 mg  650 mg Oral Q6H PRN Laverle Hobby, PA-C      . alum & mag hydroxide-simeth (MAALOX/MYLANTA) 200-200-20 MG/5ML suspension 30 mL  30 mL Oral Q4H PRN Laverle Hobby, PA-C      . hydrOXYzine (ATARAX/VISTARIL) tablet 25 mg  25 mg Oral Q6H PRN Laverle Hobby, PA-C      . loperamide (IMODIUM) capsule 2-4 mg  2-4 mg Oral PRN Laverle Hobby, PA-C      . LORazepam (ATIVAN) tablet 1 mg  1 mg Oral Q6H PRN Laverle Hobby, PA-C      . LORazepam (ATIVAN) tablet 1 mg  1 mg Oral QID Laverle Hobby, PA-C   1 mg at 11/01/16 0751   Followed by  . [START ON 11/02/2016] LORazepam (ATIVAN) tablet 1 mg  1 mg Oral TID Laverle Hobby, PA-C       Followed by  . [START ON 11/03/2016] LORazepam (ATIVAN) tablet 1 mg  1 mg Oral BID Laverle Hobby, PA-C       Followed by  . [START ON 11/05/2016] LORazepam (ATIVAN) tablet 1 mg  1 mg Oral Daily  Laverle Hobby, PA-C      . magnesium hydroxide (MILK OF MAGNESIA) suspension 30 mL  30 mL Oral Daily PRN Laverle Hobby, PA-C      . multivitamin with minerals tablet 1 tablet  1 tablet Oral Daily Laverle Hobby, PA-C   1 tablet at 11/01/16 0751  . ondansetron (ZOFRAN-ODT) disintegrating tablet 4 mg  4 mg Oral Q6H PRN Laverle Hobby, PA-C      . thiamine (B-1) injection 100 mg  100 mg Intramuscular Once [employer], PA-C      . thiamine (VITAMIN B-1) tablet 100 mg  100 mg Oral Daily Laverle Hobby, PA-C   100 mg at 11/01/16 0751  . traZODone (DESYREL) tablet  50 mg  50 mg Oral QHS,MR X 1 Laverle Hobby, PA-C   50 mg at 10/31/16 2213   PTA Medications: No prescriptions prior to admission.    Musculoskeletal: Strength & Muscle Tone: within normal limits Gait & Station: normal Patient leans: N/A  Psychiatric Specialty Exam: Physical Exam  Review of Systems  Psychiatric/Behavioral: Positive for depression and substance abuse.    Blood pressure (!) 131/93, pulse 92, temperature 98.4 F (36.9 C), temperature source Oral, resp. rate 16, height '5\' 4"'$  (1.626 m), weight 64.9 kg (143 lb), SpO2 96 %.Body mass index is 24.55 kg/m.  General Appearance: Casual  Eye Contact:  Good  Speech:  Clear and Coherent  Volume:  Normal  Mood:  Depressed  Affect:  Appropriate  Thought Process:  Coherent and Goal Directed  Orientation:  Full (Time, Place, and Person)  Thought Content:  Desire to stop abusing drugs   Suicidal Thoughts:  No  Homicidal Thoughts:  No  Memory:  Immediate;   Good Recent;   Good Remote;   Good  Judgement:  Poor  Insight:  Present  Psychomotor Activity:  Normal  Concentration:  Concentration: Good and Attention Span: Good  Recall:  Good  Fund of Knowledge:  Good  Language:  Good  Akathisia:  No  Handed:  Right  AIMS (if indicated):     Assets:  Communication Skills Desire for Improvement Leisure Time Physical Health Resilience  ADL's:  Intact  Cognition:  WNL  Sleep:         Treatment Plan Summary: Daily contact with patient to assess and evaluate symptoms and progress in treatment and Medication management  Observation Level/Precautions:  Continuous Observation Laboratory:  CBC Chemistry Profile UDS ETOH level  Psychotherapy:  Individual for substance abuse  Medications:  Ativan taper for alcohol use  Consultations:  None  Discharge Concerns:  Continued substance abuse  Estimated LOS: 24 hours Other:  Needs referral for substance abuse treatment     Llewellyn Schoenberger, NP 11/7/201711:37 AM

## 2016-11-08 NOTE — Discharge Summary (Signed)
[facility]-Observation Unit Discharge Summary  Admit date: 10/31/2016 Discharge date: 11/01/2016   History of Present Illness:   Per initial Tele Assessment note from [facility] on 10/31/2016:   [name] Carrel Statenis an 55 y.o.malewith history of alcohol and drug use. Patient presents to [facility] voluntarily for help with suicidal thoughts and substance use. Patient was self referred. Patient reports passive suicidal thoughts today. He does not have a clear suicide plan. Patient with hesitancy sts that in the past he thought of (shooting self in the head, jumping off a cliff, etc.) but never would do these things to himself. No suicide intent. Patient denies access to firearms. No past history of suicide attempts/gestures. No self mutilating behaviors. He does report a long history of depressive symptoms including loss of interest in usual pleasures, fatigue, crying spells, isolating self from others, etc. He denies issues with anxiety. He denies HI. No history of violent or aggressive behaviors. No current legal issues. Patient admits that he has spent a lot of time in/out of prison. He denies current auditory hallucinations. Sts that in the past when he uses drugs he hears voices. He explains that the voices tell him to hurt people but he would never act on those hallucinations. No visual hallucinations. Patient reports a long history of crack cocaine and alcohol use. SEE ADDITIONAL HISTORY section for details related to substance use. Patient does not have a psychiatrist or therapist. He has participated in several treatment programs in the past 7 yrs: [facility], [facility], [facility], and [facility].   Patient was monitored overnight in the [facility]Observation Unit. He was accepted to [facility] for residential treatment program. Patient scheduled to leave the Observation Unit around 5:30 pm on 11/01/2016. He denied any suicidal thoughts. Justo reports no previous psych history other than substance abuse. The patient reported feeling  positive about getting treatment. There were no active signs or symptoms of withdrawal. He was started on the ativan taper. The patient did not reported being on any medications prior to his admission. He was found stable to discharge to [facility] on 11/01/2016. Patient left [facility] in stable condition with all belongings returned to him. He was noted to be in good spirits and optimistic about receiving residential treatment.

## 2017-02-02 ENCOUNTER — Ambulatory Visit: Payer: Self-pay | Admitting: Internal Medicine

## 2017-02-02 VITALS — BP 133/89 | HR 82 | Temp 97.8°F | Wt 151.8 lb

## 2017-02-02 DIAGNOSIS — F1011 Alcohol abuse, in remission: Secondary | ICD-10-CM

## 2017-02-02 DIAGNOSIS — R7309 Other abnormal glucose: Secondary | ICD-10-CM

## 2017-02-02 LAB — GLUCOSE, POCT (MANUAL RESULT ENTRY): POC Glucose: 104 mg/dl — AB (ref 70–99)

## 2017-02-02 NOTE — Patient Instructions (Signed)
Flu shot given today.  Followup in 3-6 months with labs ahead of time:  Glucose, lipid panel Followup dentist as planned.  Try to stop smoking.

## 2017-02-02 NOTE — Progress Notes (Signed)
  Patient: [name] [name] Male    DOB: Jan 19, 1961   56 y.o.   MRN: [id number] Visit Date: 02/02/2017  Today's Provider: Eustace Moore, MD   Chief Complaint  Patient presents with  . Annual Exam    wants to ask about colonoscopy, check glucose  . Blurred Vision   Subjective:    HPI  Here as a new patient, feels ok except for some dental issues (has appt with dentist).  Little bit of blurry vision, notices when reading.  Gradual onset.  Tested for HIV etc a couple months ago, all negative.  Staying at [facility], clean for 90 days.  Interested in appropriate health screenings.  No other known PMH, not taking any meds regularly.     No Known Allergies  Medications:  Not taking any meds regularly  Review of Systems  All other systems reviewed and are negative.   Social History  Substance Use Topics  . Smoking status: Current Every Day Smoker  . Smokeless tobacco: Never Used  . Alcohol use 1.8 oz/week    3 Cans of beer per week     Comment: daily   Objective:   BP 133/89   Pulse 82   Temp 97.8 F (36.6 C)   Wt 151 lb 12.8 oz (68.9 kg)   BMI 26.06 kg/m   Physical Exam  Constitutional: He is oriented to person, place, and time. No distress.  Alert, nicely groomed Minimal eye contact Room smells a little smoky Has tried to quit smoking before, 4-5 cigs/day now  HENT:  Head: Atraumatic.  Eyes:  Conjugate gaze, no eye redness/drainage  Neck: Neck supple.  Cardiovascular: Normal rate and regular rhythm.   Pulmonary/Chest: No respiratory distress. He has no wheezes. He has no rales.  Slightly coarse but symmetric breath sounds throughout  Abdominal: He exhibits no distension.  Musculoskeletal: Normal range of motion. He exhibits no edema.  Neurological: He is alert and oriented to person, place, and time.  Skin: Skin is warm and dry.  No cyanosis  Nursing note and vitals reviewed.       Assessment & Plan:    history of elevated glucose:   Rechecked, fingerstick 104 in clinic (random) tonight.  No hx DM.  Health screening:  Normal CBC, CMP in 10/2016.  Check lipid panel, glucose, before return in 3-6 months.  Flu shot given today:  Lot [id number] exp 05/2017 quadrivalent flucelvax R deltoid  Try readers for blurry vision (gradual onset of decreased visual acuity, noticing this because reading a lot at his treatment program)       Eustace Moore, MD   [name]

## 2017-02-07 ENCOUNTER — Other Ambulatory Visit: Payer: Self-pay

## 2017-02-17 ENCOUNTER — Ambulatory Visit: Payer: Self-pay | Admitting: Pharmacy Technician

## 2017-02-17 NOTE — Progress Notes (Signed)
Patient scheduled for eligibility appointment at [facility].  Patient did not show for the appointment on 02/17/17 at 9:00a.m.Marland Kitchen  Patient did not reschedule eligibility appointment.  Sherilyn Dacosta Care Manager [facility]

## 2017-03-15 ENCOUNTER — Ambulatory Visit: Payer: Self-pay | Admitting: Pharmacy Technician

## 2017-03-16 NOTE — Progress Notes (Signed)
Patient scheduled for eligibility appointment at [facility].  Patient did not show for the appointment on 03/15/17 at 2:00p.m.  Second time patient a no-show for eligibility appt.  Patient did not reschedule eligibility appointment.  [facility] will be unable to provide additional medication assistance until eligibility is determined.  Sherilyn Dacosta Care Manager [facility]

## 2017-06-25 ENCOUNTER — Emergency Department (HOSPITAL_COMMUNITY): Payer: Self-pay

## 2017-06-25 ENCOUNTER — Emergency Department (HOSPITAL_COMMUNITY)
Admission: EM | Admit: 2017-06-25 | Discharge: 2017-06-25 | Disposition: A | Discharge status: Home / Self Care | Payer: Self-pay | Attending: Emergency Medicine | Admitting: Emergency Medicine

## 2017-06-25 ENCOUNTER — Encounter (HOSPITAL_COMMUNITY): Payer: Self-pay | Admitting: *Deleted

## 2017-06-25 DIAGNOSIS — M79672 Pain in left foot: Secondary | ICD-10-CM | POA: Insufficient documentation

## 2017-06-25 DIAGNOSIS — F17200 Nicotine dependence, unspecified, uncomplicated: Secondary | ICD-10-CM | POA: Insufficient documentation

## 2017-06-25 DIAGNOSIS — F1010 Alcohol abuse, uncomplicated: Secondary | ICD-10-CM | POA: Insufficient documentation

## 2017-06-25 NOTE — ED Provider Notes (Signed)
Emergency Department Provider Note   I have reviewed the triage vital signs and the nursing notes.   HISTORY  Chief Complaint Foot Pain   HPI [name] [name] is a 56 y.o. male with PMH of drug and alcohol abuse s/p right great toe partial amputation after traumatic injury presents to the emergency department for evaluation of continued bilateral foot pain for the past several months to year. He describes worsening pain with wearing shoes. He has more pain in the left foot specifically over the left toe. Denies any drainage or ulceration. No bleeding. No foot swelling. No fevers or chills. No leg discomfort. His right foot is also painful but is less so compared to the left. No radiation of symptoms.   Past Medical History:  Diagnosis Date  . Alcohol abuse   . Drug abuse     Patient Active Problem List   Diagnosis Date Noted  . Substance induced mood disorder ([facility]) 10/31/2016    History reviewed. No pertinent surgical history.  Current Outpatient Rx  . Order #: [id number] Class: No Print    Allergies Patient has no known allergies.  History reviewed. No pertinent family history.  Social History Social History  Substance Use Topics  . Smoking status: Current Every Day Smoker  . Smokeless tobacco: Never Used  . Alcohol use 1.8 oz/week    3 Cans of beer per week     Comment: daily    Review of Systems  Constitutional: No fever/chills Eyes: No visual changes. ENT: No sore throat. Cardiovascular: Denies chest pain. Respiratory: Denies shortness of breath. Gastrointestinal: No abdominal pain.  No nausea, no vomiting.  No diarrhea.  No constipation. Genitourinary: Negative for dysuria. Musculoskeletal: Negative for back pain. Positive bilateral foot pain L>R Skin: Negative for rash. Neurological: Negative for headaches, focal weakness or numbness.  10-point ROS otherwise negative.  ____________________________________________   PHYSICAL EXAM:  VITAL  SIGNS: ED Triage Vitals  Enc Vitals Group     BP 06/25/17 0723 (!) 129/93     Pulse Rate 06/25/17 0723 92     Resp 06/25/17 0723 18     Temp 06/25/17 0723 98 F (36.7 C)     Temp Source 06/25/17 0723 Oral     SpO2 06/25/17 0723 99 %     Weight 06/25/17 0723 138 lb (62.6 kg)     Height 06/25/17 0723 5\' 5"  (1.651 m)     Pain Score 06/25/17 0726 7   Constitutional: Alert and oriented. Well appearing and in no acute distress. Eyes: Conjunctivae are normal. Head: Atraumatic. Nose: No congestion/rhinnorhea. Mouth/Throat: Mucous membranes are moist.  Neck: No stridor.   Cardiovascular: Normal rate, regular rhythm. Good peripheral circulation. Grossly normal heart sounds.   Respiratory: Normal respiratory effort.  No retractions. Lungs CTAB. Gastrointestinal: Soft and nontender. No distention.  Musculoskeletal: No lower extremity tenderness nor edema. S/p left great toe amputation.  Neurologic:  Normal speech and language. No gross focal neurologic deficits are appreciated.  Skin:  Skin is warm, dry. Callous formation over left second toe. No ulcerations or surrounding erythema.  ____________________________________________  RADIOLOGY  No results found.  ____________________________________________   PROCEDURES  Procedure(s) performed:   Procedures  None ____________________________________________   INITIAL IMPRESSION / ASSESSMENT AND PLAN / ED COURSE  Pertinent labs & imaging results that were available during my care of the patient were reviewed by me and considered in my medical decision making (see chart for details).  Patient resents to the emergency department for evaluation  of chronic bilateral foot pain worse in the left. He does have a second toe callus with no ulceration. No erythema or concern for osteomyelitis. Plan for plain film of the left foot with worsening pain and abnormality noted to rule out bony injury. Plan for podiatry follow-up.   Plain films are  negative. Referred to Podiatry.   At this time, I do not feel there is any life-threatening condition present. I have reviewed and discussed all results (EKG, imaging, lab, urine as appropriate), exam findings with patient. I have reviewed nursing notes and appropriate previous records.  I feel the patient is safe to be discharged home without further emergent workup. Discussed usual and customary return precautions. Patient and family (if present) verbalize understanding and are comfortable with this plan.  Patient will follow-up with their primary care provider. If they do not have a primary care provider, information for follow-up has been provided to them. All questions have been answered.  ____________________________________________  FINAL CLINICAL IMPRESSION(S) / ED DIAGNOSES  Final diagnoses:  Foot pain, left     MEDICATIONS GIVEN DURING THIS VISIT:  None  NEW OUTPATIENT MEDICATIONS STARTED DURING THIS VISIT:  None   Note:  This document was prepared using Dragon voice recognition software and may include unintentional dictation errors.  Alona Bene, MD Emergency Medicine   Threasa Kinch, Arlyss Repress, MD 06/26/17 [phone]

## 2017-06-25 NOTE — Discharge Instructions (Signed)
You were seen in the ED today with foot pain. The x-rays were normal. Follow up with the podiatrist listed below.

## 2017-06-25 NOTE — ED Notes (Signed)
Patient transported to X-ray 

## 2017-06-25 NOTE — ED Triage Notes (Signed)
Pt reports bilateral foot and toe pain, increases when wearing his work boots. No obv injury or swelling noted. And pt is ambulatory at triage.

## 2017-06-25 NOTE — ED Notes (Signed)
Dr. Long at bedside at this time.  

## 2017-06-25 NOTE — ED Notes (Signed)
See EDP secondary assessment.  

## 2017-07-17 ENCOUNTER — Ambulatory Visit: Payer: Self-pay | Admitting: Podiatry

## 2017-08-01 ENCOUNTER — Other Ambulatory Visit: Payer: Self-pay

## 2017-08-03 ENCOUNTER — Ambulatory Visit: Payer: Self-pay

## 2018-06-22 ENCOUNTER — Emergency Department (HOSPITAL_COMMUNITY)
Admission: EM | Admit: 2018-06-22 | Discharge: 2018-06-22 | Disposition: A | Discharge status: Home / Self Care | Payer: Managed Care, Other (non HMO) | Attending: Emergency Medicine | Admitting: Emergency Medicine

## 2018-06-22 ENCOUNTER — Other Ambulatory Visit: Payer: Self-pay

## 2018-06-22 ENCOUNTER — Encounter (HOSPITAL_COMMUNITY): Payer: Self-pay | Admitting: Emergency Medicine

## 2018-06-22 DIAGNOSIS — H1031 Unspecified acute conjunctivitis, right eye: Secondary | ICD-10-CM | POA: Insufficient documentation

## 2018-06-22 DIAGNOSIS — F17210 Nicotine dependence, cigarettes, uncomplicated: Secondary | ICD-10-CM | POA: Insufficient documentation

## 2018-06-22 MED ORDER — TETRACAINE HCL 0.5 % OP SOLN
2.0000 [drp] | Freq: Once | OPHTHALMIC | Status: AC
  Administered 2018-06-22: 2 [drp] via OPHTHALMIC
  Filled 2018-06-22: qty 4

## 2018-06-22 MED ORDER — FLUORESCEIN SODIUM 1 MG OP STRP
1.0000 | ORAL_STRIP | Freq: Once | OPHTHALMIC | Status: AC
  Administered 2018-06-22: 1 via OPHTHALMIC
  Filled 2018-06-22: qty 1

## 2018-06-22 MED ORDER — NAPHAZOLINE-PHENIRAMINE 0.025-0.3 % OP SOLN: 1.0000 [drp] | Freq: Two times a day (BID) | OPHTHALMIC | 0 refills | Status: DC | PRN

## 2018-06-22 MED ORDER — POLYMYXIN B-TRIMETHOPRIM 10000-0.1 UNIT/ML-% OP SOLN
2.0000 [drp] | OPHTHALMIC | Status: DC
  Administered 2018-06-22: 2 [drp] via OPHTHALMIC
  Filled 2018-06-22: qty 10

## 2018-06-22 NOTE — ED Triage Notes (Signed)
Pt reports feeling like something got in his R eye yesterday, states he rubbed it trying to get it out and is now having pain and still feels like something is in it. No vision changes reported

## 2018-06-22 NOTE — ED Provider Notes (Signed)
[facility] EMERGENCY DEPARTMENT Provider Note   CSN: [id number] Arrival date & time: 06/22/18  4098     History   Chief Complaint Chief Complaint  Patient presents with  . Eye Problem    HPI [name] [name] is a 57 y.o. male history of alcohol abuse here presenting with eye pain.  She states that he works in [employer] and is around a Administrator, [employer] and other machineries.  Patient states that yesterday he may have something got into his right eye.  He states that the right eye it is red and he has some drainage this morning.  Patient did not use any eye protection.  Denies any fevers or chills.  The history is provided by the patient.    Past Medical History:  Diagnosis Date  . Alcohol abuse   . Drug abuse [facility])     Patient Active Problem List   Diagnosis Date Noted  . Substance induced mood disorder (HCC) 10/31/2016    History reviewed. No pertinent surgical history.      Home Medications    Prior to Admission medications   Medication Sig Start Date End Date Taking? Authorizing Provider  Multiple Vitamin (MULTIVITAMIN WITH MINERALS) TABS tablet Take 1 tablet by mouth daily. Patient not taking: Reported on 02/02/2017 11/02/16   Thermon Leyland, NP    Family History No family history on file.  Social History Social History   Tobacco Use  . Smoking status: Current Every Day Smoker  . Smokeless tobacco: Never Used  Substance Use Topics  . Alcohol use: Yes    Alcohol/week: 1.8 oz    Types: 3 Cans of beer per week    Comment: daily  . Drug use: Yes    Types: Marijuana    Comment: crack     Allergies   Patient has no known allergies.   Review of Systems Review of Systems  Eyes: Positive for pain, discharge and redness.  All other systems reviewed and are negative.    Physical Exam Updated Vital Signs BP (!) 127/92 (BP Location: Right Arm)   Pulse 76   Temp 98.2 F (36.8 C) (Oral)   Resp 16   SpO2 97%   Physical Exam    Constitutional: He appears well-developed and well-nourished.  HENT:  Head: Normocephalic and atraumatic.  Eyes:  Bilateral conjunctiva red. flurescein stain done and there is no abrasion or foreign body in R eye. No foreign body under the eyelid. No periorbital swelling   Neck: Normal range of motion. Neck supple.  Cardiovascular: Normal rate, regular rhythm and normal heart sounds.  Pulmonary/Chest: Effort normal and breath sounds normal.  Abdominal: Soft. Bowel sounds are normal.  Musculoskeletal: Normal range of motion.  Neurological: He is alert.  Skin: Skin is warm.  Psychiatric: He has a normal mood and affect.  Nursing note and vitals reviewed.    ED Treatments / Results  Labs (all labs ordered are listed, but only abnormal results are displayed) Labs Reviewed - No data to display  EKG None  Radiology No results found.  Procedures Procedures (including critical care time)  Medications Ordered in ED Medications  trimethoprim-polymyxin b (POLYTRIM) ophthalmic solution 2 drop (has no administration in time range)  fluorescein ophthalmic strip 1 strip (1 strip Both Eyes Given 06/22/18 0849)  tetracaine (PONTOCAINE) 0.5 % ophthalmic solution 2 drop (2 drops Both Eyes Given 06/22/18 0849)     Initial Impression / Assessment and Plan / ED Course  I have reviewed the triage vital signs and the nursing notes.  Pertinent labs & imaging results that were available during my care of the patient were reviewed by me and considered in my medical decision making (see chart for details).     [name] [name] is a 57 y.o. male here with bilateral eye redness. Likely allergic vs bacterial vs viral conjunctivitis. Flurescein showed no obvious corneal abrasion or foreign body. Given polytrim empirically, will try visine as well. Recommend eye protection in the future     Final Clinical Impressions(s) / ED Diagnoses   Final diagnoses:  None    ED Discharge Orders    None        Charlynne Pander, MD 06/22/18 [phone]

## 2018-06-22 NOTE — Discharge Instructions (Addendum)
Use eye protection in the future.   Try polytrim drops twice daily for 5 days.   Try visine drops for comfort twice daily as needed.   See your doctor   Return to ER if you have worse eye redness, discharge, eyelid swelling.

## 2019-05-30 ENCOUNTER — Emergency Department (HOSPITAL_COMMUNITY): Payer: Worker's Compensation | Admitting: Anesthesiology

## 2019-05-30 ENCOUNTER — Emergency Department (HOSPITAL_COMMUNITY): Payer: Worker's Compensation

## 2019-05-30 ENCOUNTER — Encounter (HOSPITAL_COMMUNITY): Payer: Self-pay

## 2019-05-30 ENCOUNTER — Encounter (HOSPITAL_COMMUNITY)
Admission: EM | Disposition: A | Discharge status: Home / Self Care | Payer: Self-pay | Source: Home / Self Care | Attending: Emergency Medicine

## 2019-05-30 ENCOUNTER — Ambulatory Visit (HOSPITAL_COMMUNITY)
Admission: EM | Admit: 2019-05-30 | Discharge: 2019-05-30 | Disposition: A | Discharge status: Home / Self Care | Payer: Worker's Compensation | Attending: Emergency Medicine | Admitting: Emergency Medicine

## 2019-05-30 ENCOUNTER — Other Ambulatory Visit: Payer: Self-pay

## 2019-05-30 DIAGNOSIS — Z7289 Other problems related to lifestyle: Secondary | ICD-10-CM | POA: Insufficient documentation

## 2019-05-30 DIAGNOSIS — S68621A Partial traumatic transphalangeal amputation of left index finger, initial encounter: Secondary | ICD-10-CM | POA: Diagnosis present

## 2019-05-30 DIAGNOSIS — Z79899 Other long term (current) drug therapy: Secondary | ICD-10-CM | POA: Diagnosis not present

## 2019-05-30 DIAGNOSIS — W319XXA Contact with unspecified machinery, initial encounter: Secondary | ICD-10-CM | POA: Insufficient documentation

## 2019-05-30 DIAGNOSIS — F17210 Nicotine dependence, cigarettes, uncomplicated: Secondary | ICD-10-CM | POA: Diagnosis not present

## 2019-05-30 DIAGNOSIS — S68119A Complete traumatic metacarpophalangeal amputation of unspecified finger, initial encounter: Secondary | ICD-10-CM

## 2019-05-30 DIAGNOSIS — R9431 Abnormal electrocardiogram [ECG] [EKG]: Secondary | ICD-10-CM | POA: Insufficient documentation

## 2019-05-30 DIAGNOSIS — F1290 Cannabis use, unspecified, uncomplicated: Secondary | ICD-10-CM | POA: Diagnosis not present

## 2019-05-30 DIAGNOSIS — Z1159 Encounter for screening for other viral diseases: Secondary | ICD-10-CM | POA: Diagnosis not present

## 2019-05-30 HISTORY — PX: AMPUTATION: SHX166

## 2019-05-30 LAB — CBC
HCT: 38 % — ABNORMAL LOW (ref 39.0–52.0)
Hemoglobin: 13 g/dL (ref 13.0–17.0)
MCH: 33.1 pg (ref 26.0–34.0)
MCHC: 34.2 g/dL (ref 30.0–36.0)
MCV: 96.7 fL (ref 80.0–100.0)
Platelets: 280 10*3/uL (ref 150–400)
RBC: 3.93 MIL/uL — ABNORMAL LOW (ref 4.22–5.81)
RDW: 12.7 % (ref 11.5–15.5)
WBC: 7.2 10*3/uL (ref 4.0–10.5)
nRBC: 0 % (ref 0.0–0.2)

## 2019-05-30 LAB — COMPREHENSIVE METABOLIC PANEL
ALT: 23 U/L (ref 0–44)
AST: 29 U/L (ref 15–41)
Albumin: 4.2 g/dL (ref 3.5–5.0)
Alkaline Phosphatase: 50 U/L (ref 38–126)
Anion gap: 10 (ref 5–15)
BUN: 7 mg/dL (ref 6–20)
CO2: 23 mmol/L (ref 22–32)
Calcium: 9.1 mg/dL (ref 8.9–10.3)
Chloride: 106 mmol/L (ref 98–111)
Creatinine, Ser: 0.65 mg/dL (ref 0.61–1.24)
GFR calc Af Amer: >60 mL/min (ref >60)
GFR calc non Af Amer: >60 mL/min (ref >60)
Glucose, Bld: 85 mg/dL (ref 70–99)
Potassium: 4.1 mmol/L (ref 3.5–5.1)
Sodium: 139 mmol/L (ref 135–145)
Total Bilirubin: 1 mg/dL (ref 0.3–1.2)
Total Protein: 7.1 g/dL (ref 6.5–8.1)

## 2019-05-30 LAB — SARS CORONAVIRUS 2 BY RT PCR (HOSPITAL ORDER, PERFORMED IN ~~LOC~~ HOSPITAL LAB): SARS Coronavirus 2: NEGATIVE

## 2019-05-30 SURGERY — AMPUTATION DIGIT
Anesthesia: General | Laterality: Left

## 2019-05-30 MED ORDER — PROMETHAZINE HCL 25 MG/ML IJ SOLN: 6.2500 mg | INTRAMUSCULAR | Status: DC | PRN

## 2019-05-30 MED ORDER — POVIDONE-IODINE 10 % EX SWAB: 2.0000 "application " | Freq: Once | CUTANEOUS | Status: DC

## 2019-05-30 MED ORDER — OXYCODONE HCL 5 MG/5ML PO SOLN: 5.0000 mg | Freq: Once | ORAL | Status: DC | PRN

## 2019-05-30 MED ORDER — DEXAMETHASONE SODIUM PHOSPHATE 10 MG/ML IJ SOLN
INTRAMUSCULAR | Status: DC | PRN
  Administered 2019-05-30: 8 mg via INTRAVENOUS

## 2019-05-30 MED ORDER — BUPIVACAINE HCL 0.25 % IJ SOLN
INTRAMUSCULAR | Status: DC | PRN
  Administered 2019-05-30: 10 mL

## 2019-05-30 MED ORDER — LIDOCAINE HCL (CARDIAC) PF 100 MG/5ML IV SOSY
PREFILLED_SYRINGE | INTRAVENOUS | Status: DC | PRN
  Administered 2019-05-30: 100 mg via INTRAVENOUS

## 2019-05-30 MED ORDER — SULFAMETHOXAZOLE-TRIMETHOPRIM 800-160 MG PO TABS: 1.0000 | ORAL_TABLET | Freq: Two times a day (BID) | ORAL | 0 refills | Status: DC

## 2019-05-30 MED ORDER — OXYCODONE HCL 5 MG PO TABS: 5.0000 mg | ORAL_TABLET | Freq: Once | ORAL | Status: DC | PRN

## 2019-05-30 MED ORDER — PROPOFOL 10 MG/ML IV BOLUS
INTRAVENOUS | Status: DC | PRN
  Administered 2019-05-30: 50 mg via INTRAVENOUS
  Administered 2019-05-30: 150 mg via INTRAVENOUS

## 2019-05-30 MED ORDER — FENTANYL CITRATE (PF) 100 MCG/2ML IJ SOLN: 25.0000 ug | INTRAMUSCULAR | Status: DC | PRN

## 2019-05-30 MED ORDER — MIDAZOLAM HCL 2 MG/2ML IJ SOLN
INTRAMUSCULAR | Status: AC
  Filled 2019-05-30: qty 2

## 2019-05-30 MED ORDER — FENTANYL CITRATE (PF) 250 MCG/5ML IJ SOLN
INTRAMUSCULAR | Status: AC
  Filled 2019-05-30: qty 5

## 2019-05-30 MED ORDER — TETANUS-DIPHTH-ACELL PERTUSSIS 5-2.5-18.5 LF-MCG/0.5 IM SUSP
0.5000 mL | Freq: Once | INTRAMUSCULAR | Status: AC
  Administered 2019-05-30: 0.5 mL via INTRAMUSCULAR
  Filled 2019-05-30: qty 0.5

## 2019-05-30 MED ORDER — CHLORHEXIDINE GLUCONATE 4 % EX LIQD: 60.0000 mL | Freq: Once | CUTANEOUS | Status: DC

## 2019-05-30 MED ORDER — 0.9 % SODIUM CHLORIDE (POUR BTL) OPTIME
TOPICAL | Status: DC | PRN
  Administered 2019-05-30: 1000 mL

## 2019-05-30 MED ORDER — PHENYLEPHRINE HCL (PRESSORS) 10 MG/ML IV SOLN
INTRAVENOUS | Status: DC | PRN
  Administered 2019-05-30: 40 ug via INTRAVENOUS
  Administered 2019-05-30: 60 ug via INTRAVENOUS

## 2019-05-30 MED ORDER — BUPIVACAINE HCL (PF) 0.25 % IJ SOLN
INTRAMUSCULAR | Status: AC
  Filled 2019-05-30: qty 30

## 2019-05-30 MED ORDER — CEFAZOLIN SODIUM-DEXTROSE 2-4 GM/100ML-% IV SOLN
2.0000 g | INTRAVENOUS | Status: AC
  Administered 2019-05-30: 2 g via INTRAVENOUS

## 2019-05-30 MED ORDER — EPHEDRINE SULFATE 50 MG/ML IJ SOLN
INTRAMUSCULAR | Status: DC | PRN
  Administered 2019-05-30: 5 mg via INTRAVENOUS

## 2019-05-30 MED ORDER — PROPOFOL 10 MG/ML IV BOLUS
INTRAVENOUS | Status: AC
  Filled 2019-05-30: qty 20

## 2019-05-30 MED ORDER — BUPIVACAINE HCL 0.25 % IJ SOLN: 10.0000 mL | Freq: Once | INTRAMUSCULAR | Status: DC

## 2019-05-30 MED ORDER — ACETAMINOPHEN 10 MG/ML IV SOLN: 1000.0000 mg | Freq: Once | INTRAVENOUS | Status: DC | PRN

## 2019-05-30 MED ORDER — ONDANSETRON HCL 4 MG/2ML IJ SOLN
INTRAMUSCULAR | Status: DC | PRN
  Administered 2019-05-30: 4 mg via INTRAVENOUS

## 2019-05-30 MED ORDER — BUPIVACAINE HCL (PF) 0.25 % IJ SOLN
10.0000 mL | Freq: Once | INTRAMUSCULAR | Status: AC
  Administered 2019-05-30: 10 mL
  Filled 2019-05-30: qty 30

## 2019-05-30 MED ORDER — LACTATED RINGERS IV SOLN
INTRAVENOUS | Status: DC
  Administered 2019-05-30: 14:00:00 via INTRAVENOUS

## 2019-05-30 MED ORDER — BUPIVACAINE HCL 0.25 % IJ SOLN: 5.0000 mL | Freq: Once | INTRAMUSCULAR | Status: DC

## 2019-05-30 MED ORDER — MIDAZOLAM HCL 5 MG/5ML IJ SOLN
INTRAMUSCULAR | Status: DC | PRN
  Administered 2019-05-30: 2 mg via INTRAVENOUS

## 2019-05-30 MED ORDER — HYDROCODONE-ACETAMINOPHEN 5-325 MG PO TABS: ORAL_TABLET | ORAL | 0 refills | Status: DC

## 2019-05-30 MED ORDER — SUCCINYLCHOLINE CHLORIDE 20 MG/ML IJ SOLN
INTRAMUSCULAR | Status: DC | PRN
  Administered 2019-05-30: 40 mg via INTRAVENOUS

## 2019-05-30 MED ORDER — FENTANYL CITRATE (PF) 250 MCG/5ML IJ SOLN
INTRAMUSCULAR | Status: DC | PRN
  Administered 2019-05-30: 50 ug via INTRAVENOUS
  Administered 2019-05-30: 100 ug via INTRAVENOUS
  Administered 2019-05-30: 50 ug via INTRAVENOUS

## 2019-05-30 SURGICAL SUPPLY — 36 items
BLADE LONG MED 31X9 (MISCELLANEOUS) ×2 IMPLANT
BNDG CMPR 9X4 STRL LF SNTH (GAUZE/BANDAGES/DRESSINGS) ×1
BNDG COHESIVE 1X5 TAN STRL LF (GAUZE/BANDAGES/DRESSINGS) ×2 IMPLANT
BNDG ESMARK 4X9 LF (GAUZE/BANDAGES/DRESSINGS) ×2 IMPLANT
BNDG GAUZE ELAST 4 BULKY (GAUZE/BANDAGES/DRESSINGS) IMPLANT
CORDS BIPOLAR (ELECTRODE) ×2 IMPLANT
COVER WAND RF STERILE (DRAPES) ×2 IMPLANT
CUFF TOURNIQUET SINGLE 18IN (TOURNIQUET CUFF) ×2 IMPLANT
CUFF TOURNIQUET SINGLE 24IN (TOURNIQUET CUFF) IMPLANT
DRSG XEROFORM 1X8 (GAUZE/BANDAGES/DRESSINGS) ×1 IMPLANT
DURAPREP 26ML APPLICATOR (WOUND CARE) ×2 IMPLANT
GAUZE SPONGE 4X4 12PLY STRL (GAUZE/BANDAGES/DRESSINGS) ×1 IMPLANT
GAUZE XEROFORM 1X8 LF (GAUZE/BANDAGES/DRESSINGS) ×2 IMPLANT
GLOVE BIO SURGEON STRL SZ7.5 (GLOVE) ×2 IMPLANT
GLOVE BIOGEL PI IND STRL 8 (GLOVE) ×1 IMPLANT
GLOVE BIOGEL PI INDICATOR 8 (GLOVE) ×1
GOWN STRL REUS W/ TWL LRG LVL3 (GOWN DISPOSABLE) ×1 IMPLANT
GOWN STRL REUS W/ TWL XL LVL3 (GOWN DISPOSABLE) ×1 IMPLANT
GOWN STRL REUS W/TWL LRG LVL3 (GOWN DISPOSABLE) ×2
GOWN STRL REUS W/TWL XL LVL3 (GOWN DISPOSABLE) ×2
KIT BASIN OR (CUSTOM PROCEDURE TRAY) ×2 IMPLANT
KIT TURNOVER KIT B (KITS) ×2 IMPLANT
NDL HYPO 25GX1X1/2 BEV (NEEDLE) IMPLANT
NEEDLE HYPO 25GX1X1/2 BEV (NEEDLE) IMPLANT
NS IRRIG 1000ML POUR BTL (IV SOLUTION) ×2 IMPLANT
PACK ORTHO EXTREMITY (CUSTOM PROCEDURE TRAY) ×2 IMPLANT
PAD ARMBOARD 7.5X6 YLW CONV (MISCELLANEOUS) ×4 IMPLANT
PAD CAST 4YDX4 CTTN HI CHSV (CAST SUPPLIES) IMPLANT
PADDING CAST COTTON 4X4 STRL (CAST SUPPLIES)
SPECIMEN JAR SMALL (MISCELLANEOUS) ×2 IMPLANT
SUT CHROMIC 6 0 PS 4 (SUTURE) IMPLANT
SUT MON AB 5-0 PS2 18 (SUTURE) IMPLANT
SUT VICRYL 4-0 PS2 18IN ABS (SUTURE) IMPLANT
SYR CONTROL 10ML LL (SYRINGE) IMPLANT
TOWEL OR 17X26 10 PK STRL BLUE (TOWEL DISPOSABLE) ×2 IMPLANT
UNDERPAD 30X30 (UNDERPADS AND DIAPERS) ×2 IMPLANT

## 2019-05-30 NOTE — Anesthesia Preprocedure Evaluation (Addendum)
Anesthesia Evaluation  Patient identified by MRN, date of birth, ID band Patient awake    Reviewed: Allergy & Precautions, NPO status , Patient's Chart, lab work & pertinent test results  History of Anesthesia Complications Negative for: history of anesthetic complications  Airway Mallampati: II  TM Distance: >3 FB Neck ROM: Full    Dental  (+) Chipped,    Pulmonary Current Smoker,    Pulmonary exam normal        Cardiovascular negative cardio ROS Normal cardiovascular exam     Neuro/Psych negative neurological ROS     GI/Hepatic negative GI ROS, (+)     substance abuse  alcohol use and marijuana use,   Endo/Other  negative endocrine ROS  Renal/GU negative Renal ROS     Musculoskeletal Partial amputation left index finger   Abdominal   Peds  Hematology negative hematology ROS (+)   Anesthesia Other Findings Day of surgery medications reviewed with the patient.  Reproductive/Obstetrics                            Anesthesia Physical Anesthesia Plan  ASA: II  Anesthesia Plan: General   Post-op Pain Management:    Induction: Intravenous  PONV Risk Score and Plan: 1 and Treatment may vary due to age or medical condition, Ondansetron, Midazolam and Dexamethasone  Airway Management Planned: LMA  Additional Equipment:   Intra-op Plan:   Post-operative Plan: Extubation in OR  Informed Consent: I have reviewed the patients History and Physical, chart, labs and discussed the procedure including the risks, benefits and alternatives for the proposed anesthesia with the patient or authorized representative who has indicated his/her understanding and acceptance.     Dental advisory given  Plan Discussed with: CRNA  Anesthesia Plan Comments:       Anesthesia Quick Evaluation

## 2019-05-30 NOTE — Discharge Instructions (Signed)

## 2019-05-30 NOTE — Op Note (Signed)
NAME: [name] [name] MEDICAL RECORD NO: [id number] DATE OF BIRTH: [birth year] FACILITY: [facility] LOCATION: [facility] OR PHYSICIAN: Tami Ribas, MD   OPERATIVE REPORT   DATE OF PROCEDURE: 05/30/19    PREOPERATIVE DIAGNOSIS:   Left index finger amputation   POSTOPERATIVE DIAGNOSIS:   Left index finger amputation   PROCEDURE:   Revision left index finger amputation through distal phalanx with removal of nail.   SURGEON:  Betha Loa, M.D.   ASSISTANT: none   ANESTHESIA:  General   INTRAVENOUS FLUIDS:  Per anesthesia flow sheet.   ESTIMATED BLOOD LOSS:  Minimal.   COMPLICATIONS:  None.   SPECIMENS:  none   TOURNIQUET TIME:    Total Tourniquet Time Documented: Upper Arm (Left) - 28 minutes Total: Upper Arm (Left) - 28 minutes    DISPOSITION:  Stable to PACU.   INDICATIONS: 58 year old male states he sustained amputation of the tip of the left index finger at work this morning.  Recommend revision amputation in the operating room. Risks, benefits and alternatives of surgery were discussed including the risks of blood loss, infection, damage to nerves, vessels, tendons, ligaments, bone for surgery, need for additional surgery, complications with wound healing, continued pain, stiffness.  He voiced understanding of these risks and elected to proceed.  OPERATIVE COURSE:  After being identified preoperatively by myself,  the patient and I agreed on the procedure and site of the procedure.  The surgical site was marked.  Surgical consent had been signed. He was given IV antibiotics as preoperative antibiotic prophylaxis. He was transferred to the operating room and placed on the operating table in supine position with the Left upper extremity on an arm board.  General anesthesia was induced by the anesthesiologist.  Left upper extremity was prepped and draped in normal sterile orthopedic fashion.  A surgical pause was performed between the surgeons, anesthesia, and operating room staff  and all were in agreement as to the patient, procedure, and site of procedure.  Tourniquet at the proximal aspect of the extremity was inflated to 250 mmHg after exsanguination of the arm with an Esmarch bandage.    Wound was explored.  There is some contamination with what appeared to be grease.  This was removed.  Bipolar electrocautery was used to obtain hemostasis and bipolar back in a digital vessel branches.  The nail was removed.  There is only a short amount of nailbed remaining.  The nailbed and germinal matrix were sharply removed with a knife.  There was a broken fragment of distal phalanx which was also removed.    There was some attachment of flexor tendon remaining.  It was felt that leaving the remaining distal phalanx would provide better flexion strength in the finger.  There was grease within this fracture site.  This was able to be removed with rongeurs.  Rongeurs were used to shorten the bone to allow soft tissue coverage.  Soft tissues were mobilized and adequate coverage over the end of the bone was able to be obtained.  The wound was copiously irrigated with sterile saline.  Soft tissues were then reapproximated using 5-0 Monocryl suture in an interrupted fashion.  Good tension-free re-apposition of soft tissues was obtained.  Overall good contour to the finger.  Wound was dressed with sterile Xeroform 4 x 4 and wrapped with a Coban dressing lightly.  An AlumaFoam splint was placed and wrapped lightly with Coban dressing.  The tourniquet was deflated at 28 minutes.  Fingertips were pink with brisk  capillary refill after deflation of tourniquet.  The operative  drapes were broken down.  The patient was awoken from anesthesia safely.  He was transferred back to the stretcher and taken to PACU in stable condition.  I will see him back in the office in 1 week for postoperative followup.  I will give him a prescription for Norco 5/325 1-2 tabs PO q6 hours prn pain, dispense # 20 and Bactrim DS 1  p.o. twice daily x7 days.   Betha Loa, MD Electronically signed, 05/30/19

## 2019-05-30 NOTE — Anesthesia Procedure Notes (Signed)
Procedure Name: LMA Insertion Date/Time: 05/30/2019 3:32 PM Performed by: Marena Chancy, CRNA Pre-anesthesia Checklist: Patient identified, Emergency Drugs available, Suction available and Patient being monitored Patient Re-evaluated:Patient Re-evaluated prior to induction Oxygen Delivery Method: Circle System Utilized Preoxygenation: Pre-oxygenation with 100% oxygen Induction Type: IV induction Ventilation: Mask ventilation without difficulty LMA: LMA inserted LMA Size: 4.0 Number of attempts: 1 Airway Equipment and Method: Bite block Placement Confirmation: positive ETCO2 Tube secured with: Tape Dental Injury: Teeth and Oropharynx as per pre-operative assessment

## 2019-05-30 NOTE — Consult Note (Addendum)
Reason for Consult:Left index finger amputation Referring Physician: E Shriyan Stoiber is an 58 y.o. male.  HPI: [place] was working with a piece of feeder equipment and got his left index finger caught between 2 pieces of the machinery and it was cut off. He came to the ED where x-rays showed an open fx and hand surgery was consulted. He is RHD.  Past Medical History:  Diagnosis Date  . Alcohol abuse   . Drug abuse (HCC)     History reviewed. No pertinent surgical history.  History reviewed. No pertinent family history.  Social History:  reports that he has been smoking cigarettes. He has never used smokeless tobacco. He reports current alcohol use of about 3.0 standard drinks of alcohol per week. He reports current drug use. Drug: Marijuana.  Allergies: No Known Allergies  Medications: I have reviewed the patient's current medications.  No results found for this or any previous visit (from the past 48 hour(s)).  Dg Finger Index Left  Result Date: 05/30/2019 CLINICAL DATA:  Amputation injury at work. EXAM: LEFT INDEX FINGER 2+V COMPARISON:  None. FINDINGS: Partial amputation of the tip of the index finger. This includes most of the distal tuft. Fracture of the remaining portion of the distal phalanx extends towards the articulation but does not appear to reach it. No sign of radiopaque foreign object. IMPRESSION: Partial amputation of the index finger. The distal tuft is absent. The bone of the distal phalanx proximal to that shows a fracture extending towards the articular surface, but not apparently reaching the articular surface. Electronically Signed   By: Paulina Fusi M.D.   On: 05/30/2019 11:56    Review of Systems  Constitutional: Negative for weight loss.  HENT: Negative for ear discharge, ear pain, hearing loss and tinnitus.   Eyes: Negative for blurred vision, double vision, photophobia and pain.  Respiratory: Negative for cough, sputum production and shortness  of breath.   Cardiovascular: Negative for chest pain.  Gastrointestinal: Negative for abdominal pain, nausea and vomiting.  Genitourinary: Negative for dysuria, flank pain, frequency and urgency.  Musculoskeletal: Positive for joint pain (Left index finger). Negative for back pain, falls, myalgias and neck pain.  Neurological: Negative for dizziness, tingling, sensory change, focal weakness, loss of consciousness and headaches.  Endo/Heme/Allergies: Does not bruise/bleed easily.  Psychiatric/Behavioral: Negative for depression, memory loss and substance abuse. The patient is not nervous/anxious.    Blood pressure (!) 135/97, pulse 70, temperature 98 F (36.7 C), temperature source Oral, resp. rate 16, height 5\' 4"  (1.626 m), weight 61.2 kg, SpO2 97 %. Physical Exam  Constitutional: He appears well-developed and well-nourished. No distress.  HENT:  Head: Normocephalic and atraumatic.  Eyes: Conjunctivae are normal. Right eye exhibits no discharge. Left eye exhibits no discharge. No scleral icterus.  Neck: Normal range of motion.  Cardiovascular: Normal rate and regular rhythm.  Respiratory: Effort normal. No respiratory distress.  Musculoskeletal:     Comments: Left shoulder, elbow, wrist, digits- Amputation index finger proximal to nail bed, no instability, no blocks to motion  Sens  Ax/R/M/U intact  Mot   Ax/ R/ PIN/ M/ AIN/ U intact  Rad 2+  Neurological: He is alert.  Skin: Skin is warm and dry. He is not diaphoretic.  Psychiatric: He has a normal mood and affect. His behavior is normal.        Assessment/Plan: Left index finger amputation -- For revision amputation by Dr. Merlyn Lot this afternoon. Please keep NPO.  Freeman Caldron, PA-C Orthopedic Surgery [phone] 05/30/2019, 12:54 PM

## 2019-05-30 NOTE — ED Triage Notes (Signed)
Pt endorses having tip of left index finger cut off 1 hour ago while working with a [employer] at work. Finger is wrapped and bleeding controlled. VSS.

## 2019-05-30 NOTE — Transfer of Care (Signed)
Immediate Anesthesia Transfer of Care Note  Patient: [name]  Procedure(s) Performed: REVISION AMPUTATION OF INDEX FINGER (Left )  Patient Location: PACU  Anesthesia Type:General  Level of Consciousness: awake, alert  and oriented  Airway & Oxygen Therapy: Patient Spontanous Breathing and Patient connected to face mask oxygen  Post-op Assessment: Report given to RN, Post -op Vital signs reviewed and stable and Patient moving all extremities X 4  Post vital signs: Reviewed and stable  Last Vitals:  Vitals Value Taken Time  BP 132/81 05/30/2019  4:28 PM  Temp    Pulse 65 05/30/2019  4:32 PM  Resp 8 05/30/2019  4:32 PM  SpO2 100 % 05/30/2019  4:32 PM  Vitals shown include unvalidated device data.  Last Pain:  Vitals:   05/30/19 1401  TempSrc:   PainSc: 0-No pain         Complications: No apparent anesthesia complications

## 2019-05-30 NOTE — ED Provider Notes (Signed)
[facility] PERIOPERATIVE AREA Provider Note   CSN: [id number] Arrival date & time: 05/30/19  1025    History   Chief Complaint Chief Complaint  Patient presents with  . Hand Injury    HPI [name] [name] is a 58 y.o. male.  Who presents emergency department chief complaint of finger injury.  Patient was at work when he got his left index finger caught in a machine he was using.  He had immediate severe pain and noticed that he was missing the distal tip of his finger.  He is unsure of his last tetanus vaccination.  He is right-hand dominant.  He has no other injuries.  Bleeding is controlled prior to arrival.  Injury occurred about 1 hour prior to arrival in the emergency department.     HPI  Past Medical History:  Diagnosis Date  . Alcohol abuse   . Drug abuse [facility])     Patient Active Problem List   Diagnosis Date Noted  . Substance induced mood disorder (HCC) 10/31/2016    History reviewed. No pertinent surgical history.      Home Medications    Prior to Admission medications   Medication Sig Start Date End Date Taking? Authorizing Provider  Multiple Vitamin (MULTIVITAMIN WITH MINERALS) TABS tablet Take 1 tablet by mouth daily. 11/02/16  Yes Thermon Leyland, NP  naphazoline-pheniramine (NAPHCON-A) 0.025-0.3 % ophthalmic solution Place 1 drop into both eyes 2 (two) times daily as needed for eye irritation. Patient not taking: Reported on 05/30/2019 06/22/18   Charlynne Pander, MD    Family History History reviewed. No pertinent family history.  Social History Social History   Tobacco Use  . Smoking status: Current Every Day Smoker    Types: Cigarettes  . Smokeless tobacco: Never Used  Substance Use Topics  . Alcohol use: Yes    Alcohol/week: 3.0 standard drinks    Types: 3 Cans of beer per week    Comment: daily  . Drug use: Yes    Types: Marijuana    Comment: former cocaine/crack     Allergies   Patient has no known allergies.   Review of  Systems Review of Systems Ten systems reviewed and are negative for acute change, except as noted in the HPI.    Physical Exam Updated Vital Signs BP (!) 146/97   Pulse 65   Temp 98 F (36.7 C) (Oral)   Resp 18   Ht  (1.626 m)   Wt 61.2 kg   SpO2 98%   BMI 23.17 kg/m   Physical Exam Vitals signs and nursing note reviewed.  Constitutional:      General: He is not in acute distress.    Appearance: He is well-developed. He is not diaphoretic.  HENT:     Head: Normocephalic and atraumatic.  Eyes:     General: No scleral icterus.    Conjunctiva/sclera: Conjunctivae normal.  Neck:     Musculoskeletal: Normal range of motion and neck supple.  Cardiovascular:     Rate and Rhythm: Normal rate and regular rhythm.     Heart sounds: Normal heart sounds.  Pulmonary:     Effort: Pulmonary effort is normal. No respiratory distress.     Breath sounds: Normal breath sounds.  Abdominal:     Palpations: Abdomen is soft.     Tenderness: There is no abdominal tenderness.  Musculoskeletal:     Comments: Left index finger amputation distal to the DIP.  Skin:    General: Skin  is warm and dry.  Neurological:     Mental Status: He is alert.  Psychiatric:        Behavior: Behavior normal.      ED Treatments / Results  Labs (all labs ordered are listed, but only abnormal results are displayed) Labs Reviewed  CBC - Abnormal; Notable for the following components:      Result Value   RBC 3.93 (*)    HCT 38.0 (*)    All other components within normal limits  SARS CORONAVIRUS 2 (HOSPITAL ORDER, PERFORMED IN [facility])  COMPREHENSIVE METABOLIC PANEL    EKG None  ED ECG REPORT   Date: 05/30/2019  Rate: 70  Rhythm: normal sinus rhythm  QRS Axis: normal  Intervals: normal  ST/T Wave abnormalities: borderline ST segment elevation in the anterior leads  Conduction Disutrbances:none  Narrative Interpretation:   Old EKG Reviewed: none available  I have  personally reviewed the EKG tracing and agree with the computerized printout as noted.  Radiology Dg Finger Index Left  Result Date: 05/30/2019 CLINICAL DATA:  Amputation injury at work. EXAM: LEFT INDEX FINGER 2+V COMPARISON:  None. FINDINGS: Partial amputation of the tip of the index finger. This includes most of the distal tuft. Fracture of the remaining portion of the distal phalanx extends towards the articulation but does not appear to reach it. No sign of radiopaque foreign object. IMPRESSION: Partial amputation of the index finger. The distal tuft is absent. The bone of the distal phalanx proximal to that shows a fracture extending towards the articular surface, but not apparently reaching the articular surface. Electronically Signed   By: Paulina Fusi M.D.   On: 05/30/2019 11:56    Procedures .Nerve Block Date/Time: 05/30/2019 4:13 PM Performed by: Arthor Captain, PA-C Authorized by: Arthor Captain, PA-C   Consent:    Consent obtained:  Verbal   Consent given by:  Patient Indications:    Indications:  Pain relief Location:    Body area:  Upper extremity   Laterality:  Left Pre-procedure details:    Skin preparation:  2% chlorhexidine Skin anesthesia (see MAR for exact dosages):    Skin anesthesia method:  Local infiltration   Local anesthetic:  Bupivacaine 0.25% w/o epi Procedure details (see MAR for exact dosages):    Block needle gauge:  27 G   Injection procedure:  Anatomic landmarks identified   Paresthesia:  None Post-procedure details:    Outcome:  Anesthesia achieved   Patient tolerance of procedure:  Tolerated well, no immediate complications   (including critical care time)  Medications Ordered in ED Medications  chlorhexidine (HIBICLENS) 4 % liquid 4 application (has no administration in time range)  povidone-iodine 10 % swab 2 application (has no administration in time range)  lactated ringers infusion ( Intravenous New Bag/Given 05/30/19 1351)  bupivacaine  (MARCAINE) 0.25 % (with pres) injection (10 mLs Other Given 05/30/19 1613)  0.9 % irrigation (POUR BTL) (1,000 mLs Irrigation Given 05/30/19 1613)  Tdap (BOOSTRIX) injection 0.5 mL (0.5 mLs Intramuscular Given 05/30/19 1130)  bupivacaine (PF) (MARCAINE) 0.25 % injection 10 mL (10 mLs Infiltration Given 05/30/19 1131)  ceFAZolin (ANCEF) IVPB 2g/100 mL premix (2 g Intravenous Given 05/30/19 1535)     Initial Impression / Assessment and Plan / ED Course  I have reviewed the triage vital signs and the nursing notes.  Pertinent labs & imaging results that were available during my care of the patient were reviewed by me and considered in my medical  decision making (see chart for details).        58 year old male with traumatic distal index finger amputation.  I reviewed the patient's labs which showed no acute abnormalities.  Coronavirus testing is negative. We reviewed the patient's finger x-ray which shows partial amputation of the index finger and distal phalanx. I have consulted with Malva Cogan, PA on-call for hand surgery.  Patient be taken to the OR for management of his amputation.  Final Clinical Impressions(s) / ED Diagnoses   Final diagnoses:  Amputation of finger without complication, initial encounter    ED Discharge Orders    None       Arthor Captain, PA-C 05/30/19 1617    Alvira Monday, MD 06/02/19 [phone]

## 2019-05-30 NOTE — H&P (Addendum)
[name] [name] is an 58 y.o. male.   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  Reports no previous injury to left hand and no other injury at this time.  Had a throbbing pain that was worsened with motion but has been alleviated by digital block in ED.  There is associated bleeding from the wound.  Xrays viewed and interpreted by me: 3 views left index finger show amputation through distal phalanx.   Labs reviewed: WBC 7.2  Allergies: No Known Allergies  Past Medical History:  Diagnosis Date  . Alcohol abuse   . Drug abuse (HCC)     History reviewed. No pertinent surgical history.  Family History: History reviewed. No pertinent family history.  Social History:   reports that he has been smoking cigarettes. He has never used smokeless tobacco. He reports current alcohol use of about 3.0 standard drinks of alcohol per week. He reports current drug use. Drug: Marijuana.  Medications: Medications Prior to Admission  Medication Sig Dispense Refill  . Multiple Vitamin (MULTIVITAMIN WITH MINERALS) TABS tablet Take 1 tablet by mouth daily.    . naphazoline-pheniramine (NAPHCON-A) 0.025-0.3 % ophthalmic solution Place 1 drop into both eyes 2 (two) times daily as needed for eye irritation. (Patient not taking: Reported on 05/30/2019) 15 mL 0    Results for orders placed or performed during the hospital encounter of 05/30/19 (from the past 48 hour(s))  SARS Coronavirus 2 (CEPHEID - Performed in [facility] hospital lab), Hosp Order     Status: None   Collection Time: 05/30/19 12:56 PM  Result Value Ref Range   SARS Coronavirus 2 NEGATIVE NEGATIVE    Comment: (NOTE) If result is NEGATIVE SARS-CoV-2 target nucleic acids are NOT DETECTED. The SARS-CoV-2 RNA is generally detectable in upper and lower  respiratory specimens during the acute phase of infection. The lowest  concentration of  SARS-CoV-2 viral copies this assay can detect is 250  copies / mL. A negative result does not preclude SARS-CoV-2 infection  and should not be used as the sole basis for treatment or other  patient management decisions.  A negative result may occur with  improper specimen collection / handling, submission of specimen other  than nasopharyngeal swab, presence of viral mutation(s) within the  areas targeted by this assay, and inadequate number of viral copies  (<250 copies / mL). A negative result must be combined with clinical  observations, patient history, and epidemiological information. If result is POSITIVE SARS-CoV-2 target nucleic acids are DETECTED. The SARS-CoV-2 RNA is generally detectable in upper and lower  respiratory specimens dur ing the acute phase of infection.  Positive  results are indicative of active infection with SARS-CoV-2.  Clinical  correlation with patient history and other diagnostic information is  necessary to determine patient infection status.  Positive results do  not rule out bacterial infection or co-infection with other viruses. If result is PRESUMPTIVE POSTIVE SARS-CoV-2 nucleic acids MAY BE PRESENT.   A presumptive positive result was obtained on the submitted specimen  and confirmed on repeat testing.  While 2019 novel coronavirus  (SARS-CoV-2) nucleic acids may be present in the submitted sample  additional confirmatory testing may be necessary for epidemiological  and / or clinical management purposes  to differentiate between  SARS-CoV-2 and other Sarbecovirus currently known to infect humans.  If clinically indicated additional testing with an alternate test  methodology [phone]) is  advised. The SARS-CoV-2 RNA is generally  detectable in upper and lower respiratory sp ecimens during the acute  phase of infection. The expected result is Negative. Fact Sheet for Patients:  BoilerBrush.com.cyhttps://www.fda.gov/media/136312/download Fact Sheet for Healthcare  Providers: https://pope.com/https://www.fda.gov/media/136313/download This test is not yet approved or cleared by the [place] FDA and has been authorized for detection and/or diagnosis of SARS-CoV-2 by FDA under an Emergency Use Authorization (EUA).  This EUA will remain in effect (meaning this test can be used) for the duration of the COVID-19 declaration under Section 564(b)(1) of the Act, 21 U.S.C. section 360bbb-3(b)(1), unless the authorization is terminated or revoked sooner. Performed at [facility], [address] [place], [place] [address]   CBC     Status: Abnormal   Collection Time: 05/30/19 12:58 PM  Result Value Ref Range   WBC 7.2 4.0 - 10.5 K/uL   RBC 3.93 (L) 4.22 - 5.81 MIL/uL   Hemoglobin 13.0 13.0 - 17.0 g/dL   HCT 60.438.0 (L) 54.039.0 - 98.152.0 %   MCV 96.7 80.0 - 100.0 fL   MCH 33.1 26.0 - 34.0 pg   MCHC 34.2 30.0 - 36.0 g/dL   RDW 19.112.7 47.811.5 - 29.515.5 %   Platelets 280 150 - 400 K/uL   nRBC 0.0 0.0 - 0.2 %    Comment: Performed at [facility], [address] [place], [place] [address]  Comprehensive metabolic panel     Status: None   Collection Time: 05/30/19 12:58 PM  Result Value Ref Range   Sodium 139 135 - 145 mmol/L   Potassium 4.1 3.5 - 5.1 mmol/L   Chloride 106 98 - 111 mmol/L   CO2 23 22 - 32 mmol/L   Glucose, Bld 85 70 - 99 mg/dL   BUN 7 6 - 20 mg/dL   Creatinine, Ser 8.650.65 0.61 - 1.24 mg/dL   Calcium 9.1 8.9 - 78.410.3 mg/dL   Total Protein 7.1 6.5 - 8.1 g/dL   Albumin 4.2 3.5 - 5.0 g/dL   AST 29 15 - 41 U/L   ALT 23 0 - 44 U/L   Alkaline Phosphatase 50 38 - 126 U/L   Total Bilirubin 1.0 0.3 - 1.2 mg/dL   GFR calc non Af Amer >60 >60 mL/min   GFR calc Af Amer >60 >60 mL/min   Anion gap 10 5 - 15    Comment: Performed at [facility], [address] [place], [place] [address]    Dg Finger Index Left  Result Date: 05/30/2019 CLINICAL DATA:  Amputation injury at work. EXAM: LEFT INDEX FINGER 2+V COMPARISON:  None. FINDINGS: Partial amputation of  the tip of the index finger. This includes most of the distal tuft. Fracture of the remaining portion of the distal phalanx extends towards the articulation but does not appear to reach it. No sign of radiopaque foreign object. IMPRESSION: Partial amputation of the index finger. The distal tuft is absent. The bone of the distal phalanx proximal to that shows a fracture extending towards the articular surface, but not apparently reaching the articular surface. Electronically Signed   By: Paulina FusiMark  Shogry M.D.   On: 05/30/2019 11:56     A comprehensive review of systems was negative. Review of Systems: No fevers, chills, night sweats, chest pain, shortness of breath, nausea, vomiting, diarrhea, constipation, easy bleeding or bruising, headaches, dizziness, vision changes, fainting.   Blood pressure (!) 146/97, pulse 65, temperature 98 F (36.7 C), temperature source Oral, resp. rate  18, height 5\' 4"  (1.626 m), weight 61.2 kg, SpO2 98 %.  General appearance: alert, cooperative and appears stated age Head: Normocephalic, without obvious abnormality, atraumatic Neck: supple, symmetrical, trachea midline Resp: clear to auscultation bilaterally Cardio: regular rate and rhythm Extremities: Intact sensation and capillary refill all digits.  +epl/fpl/io.  Amputation left index finger through distal phalanx near base of nail.   Pulses: 2+ and symmetric Skin: Skin color, texture, turgor normal. No rashes or lesions Neurologic: Grossly normal Incision/Wound: as above  Assessment/Plan Left index finger amputation.  Recommend revision amputation in OR.  Likely will need ablation of nail tissue.  Possibly may need amputation through dip joint.  Risks, benefits and alternatives of surgery were discussed including risks of blood loss, infection, damage to nerves/vessels/tendons/ligament/bone, failure of surgery, need for additional surgery, complication with wound healing, stiffness, residual nail.  He voiced  understanding of these risks and elected to proceed.     Betha Loa 05/30/2019, 2:54 PM

## 2019-05-31 ENCOUNTER — Encounter (HOSPITAL_COMMUNITY): Payer: Self-pay | Admitting: Orthopedic Surgery

## 2019-05-31 NOTE — Anesthesia Postprocedure Evaluation (Signed)
Anesthesia Post Note  Patient: [name] [name]  Procedure(s) Performed: REVISION AMPUTATION OF INDEX FINGER (Left )     Patient location during evaluation: PACU Anesthesia Type: General Level of consciousness: awake and alert Pain management: pain level controlled Vital Signs Assessment: post-procedure vital signs reviewed and stable Respiratory status: spontaneous breathing, nonlabored ventilation and respiratory function stable Cardiovascular status: blood pressure returned to baseline and stable Postop Assessment: no apparent nausea or vomiting Anesthetic complications: no    Last Vitals:  Vitals:   05/30/19 1700 05/30/19 1715  BP: 138/83 (!) 153/81  Pulse: (!) 58 (!) 59  Resp: 15 12  Temp:  36.6 C  SpO2: 90% 96%    Last Pain:  Vitals:   05/30/19 1715  TempSrc:   PainSc: 0-No pain                 Beryle Lathe

## 2019-06-19 DIAGNOSIS — S68111A Complete traumatic metacarpophalangeal amputation of left index finger, initial encounter: Secondary | ICD-10-CM | POA: Insufficient documentation

## 2020-06-26 ENCOUNTER — Other Ambulatory Visit: Payer: Self-pay

## 2020-06-26 ENCOUNTER — Encounter (HOSPITAL_BASED_OUTPATIENT_CLINIC_OR_DEPARTMENT_OTHER): Payer: Self-pay | Admitting: Orthopedic Surgery

## 2020-06-26 NOTE — Progress Notes (Signed)
Spoke w/ via phone for pre-op interview---pt  Lab needs dos----   none            COVID test ------06-30-2020 at 900 am schedule ortho covid tests per beverly harrelson Arrive at -------530 am 07-03-20 No food after midnight clear liquids from midnight until 430 am then npo Medications to take morning of surgery -----none Diabetic medication -----n/a Patient Special Instructions -----patient to bring covid card day of surgeyr Pre-Op special Istructions -----none Patient verbalized understanding of instructions that were given at this phone interview. Patient denies shortness of breath, chest pain, fever, cough a this phone interview.

## 2020-06-26 NOTE — Progress Notes (Signed)
NEW Covid Policy July 2021  Surgery Day:  07-03-20  Facility:  [facility]  Type of Surgery:right rotator cuff repair  Fully Covid Vaccinated:   1)03-20-2020                                          2)04-08-2020                                          Where? [facility]                                           Type?[facility] pts need covid test per beverly harrelson  Do you have symptoms? no  In the past 14 days:        Have you had any symptoms?no       Have you been tested covid positive?no       Have you been in contact with someone covid positive?no        Is pt Immuno-compromised?no

## 2020-06-30 ENCOUNTER — Other Ambulatory Visit (HOSPITAL_COMMUNITY)
Admission: RE | Admit: 2020-06-30 | Discharge: 2020-06-30 | Disposition: A | Discharge status: Home / Self Care | Payer: 59 | Source: Ambulatory Visit | Attending: Orthopedic Surgery | Admitting: Orthopedic Surgery

## 2020-06-30 ENCOUNTER — Ambulatory Visit: Payer: Self-pay | Admitting: Physician Assistant

## 2020-06-30 DIAGNOSIS — Z01812 Encounter for preprocedural laboratory examination: Secondary | ICD-10-CM | POA: Insufficient documentation

## 2020-06-30 DIAGNOSIS — Z20822 Contact with and (suspected) exposure to covid-19: Secondary | ICD-10-CM | POA: Diagnosis not present

## 2020-06-30 LAB — SARS CORONAVIRUS 2 (TAT 6-24 HRS): SARS Coronavirus 2: NEGATIVE

## 2020-06-30 NOTE — H&P (Signed)
[name] [name] is an 59 y.o. male.   Chief Complaint: right shoulder pain HPI: He has full thickness small tear, severe AC arthropathy.  C-spine has some issues, but I think the vast majority of the shoulder pain on the right is explained by this.  Past Medical History:  Diagnosis Date  . Alcohol abuse   . Drug abuse (HCC)   . Right rotator cuff tear     Past Surgical History:  Procedure Laterality Date  . AMPUTATION Left 05/30/2019   Procedure: REVISION AMPUTATION OF INDEX FINGER;  Surgeon: Kuzma, Kevin, MD;  Location: [facility] OR;  Service: Orthopedics;  Laterality: Left;  . toe amputaion Left yrs ago    No family history on file. Social History:  reports that he has been smoking cigarettes. He has a 7.50 pack-year smoking history. He has never used smokeless tobacco. He reports current alcohol use of about 3.0 standard drinks of alcohol per week. He reports current drug use. Drug: Marijuana.  Allergies: No Known Allergies  (Not in a hospital admission)   No results found for this or any previous visit (from the past 48 hour(s)). No results found.  Review of Systems  Musculoskeletal: Positive for arthralgias.  All other systems reviewed and are negative.   There were no vitals taken for this visit. Physical Exam Constitutional:      General: He is not in acute distress.    Appearance: Normal appearance.  HENT:     Head: Normocephalic and atraumatic.  Eyes:     Extraocular Movements: Extraocular movements intact.     Pupils: Pupils are equal, round, and reactive to light.  Cardiovascular:     Rate and Rhythm: Normal rate.     Pulses: Normal pulses.  Pulmonary:     Effort: Pulmonary effort is normal. No respiratory distress.  Abdominal:     General: Abdomen is flat. There is no distension.  Musculoskeletal:     Right shoulder: Tenderness and bony tenderness present. Decreased range of motion. Decreased strength.     Cervical back: Normal range of motion.  Skin:     General: Skin is warm and dry.     Findings: No erythema or rash.  Neurological:     General: No focal deficit present.     Mental Status: He is alert and oriented to person, place, and time.  Psychiatric:        Mood and Affect: Mood normal.        Behavior: Behavior normal.      Assessment/Plan Plan arthroscopy, acromioplasty, distal clavicle excision, probable open rotator cuff repair.  General nerve block.  The patient lives by himself.  He understands he will have to have someone who can bring him and be with him the night of surgery.  He does physical work.  Total disability.  He will be out of work at least three months.  He is in a significant amount of pain.  Apparently he just had a physical done at an urgent care setting.  Denies any significant health history.  Followup pending results of above.   [name] Spainhour, PA-C 06/30/2020, 5:04 PM   

## 2020-06-30 NOTE — H&P (View-Only) (Signed)
[name] [name] is an 59 y.o. male.   Chief Complaint: right shoulder pain HPI: He has full thickness small tear, severe AC arthropathy.  C-spine has some issues, but I think the vast majority of the shoulder pain on the right is explained by this.  Past Medical History:  Diagnosis Date  . Alcohol abuse   . Drug abuse (HCC)   . Right rotator cuff tear     Past Surgical History:  Procedure Laterality Date  . AMPUTATION Left 05/30/2019   Procedure: REVISION AMPUTATION OF INDEX FINGER;  Surgeon: Betha Loa, MD;  Location: [facility] OR;  Service: Orthopedics;  Laterality: Left;  . toe amputaion Left yrs ago    No family history on file. Social History:  reports that he has been smoking cigarettes. He has a 7.50 pack-year smoking history. He has never used smokeless tobacco. He reports current alcohol use of about 3.0 standard drinks of alcohol per week. He reports current drug use. Drug: Marijuana.  Allergies: No Known Allergies  (Not in a hospital admission)   No results found for this or any previous visit (from the past 48 hour(s)). No results found.  Review of Systems  Musculoskeletal: Positive for arthralgias.  All other systems reviewed and are negative.   There were no vitals taken for this visit. Physical Exam Constitutional:      General: He is not in acute distress.    Appearance: Normal appearance.  HENT:     Head: Normocephalic and atraumatic.  Eyes:     Extraocular Movements: Extraocular movements intact.     Pupils: Pupils are equal, round, and reactive to light.  Cardiovascular:     Rate and Rhythm: Normal rate.     Pulses: Normal pulses.  Pulmonary:     Effort: Pulmonary effort is normal. No respiratory distress.  Abdominal:     General: Abdomen is flat. There is no distension.  Musculoskeletal:     Right shoulder: Tenderness and bony tenderness present. Decreased range of motion. Decreased strength.     Cervical back: Normal range of motion.  Skin:     General: Skin is warm and dry.     Findings: No erythema or rash.  Neurological:     General: No focal deficit present.     Mental Status: He is alert and oriented to person, place, and time.  Psychiatric:        Mood and Affect: Mood normal.        Behavior: Behavior normal.      Assessment/Plan Plan arthroscopy, acromioplasty, distal clavicle excision, probable open rotator cuff repair.  General nerve block.  The patient lives by himself.  He understands he will have to have someone who can bring him and be with him the night of surgery.  He does physical work.  Total disability.  He will be out of work at least three months.  He is in a significant amount of pain.  Apparently he just had a physical done at an urgent care setting.  Denies any significant health history.  Followup pending results of above.   Margart Sickles, PA-C 06/30/2020, 5:04 PM

## 2020-07-02 NOTE — Anesthesia Preprocedure Evaluation (Addendum)
Anesthesia Evaluation  Patient identified by MRN, date of birth, ID band Patient awake    Reviewed: Allergy & Precautions, NPO status , Patient's Chart, lab work & pertinent test results  Airway Mallampati: I  TM Distance: >3 FB Neck ROM: Full    Dental  (+) Missing, Chipped,    Pulmonary Current SmokerPatient did not abstain from smoking.,    Pulmonary exam normal breath sounds clear to auscultation       Cardiovascular negative cardio ROS Normal cardiovascular exam Rhythm:Regular Rate:Normal     Neuro/Psych PSYCHIATRIC DISORDERS negative neurological ROS     GI/Hepatic negative GI ROS, (+)     substance abuse  ,   Endo/Other  negative endocrine ROS  Renal/GU negative Renal ROS     Musculoskeletal negative musculoskeletal ROS (+)   Abdominal   Peds  Hematology negative hematology ROS (+)   Anesthesia Other Findings RIGHT SHOULDER ROTATOR CUFF REPAIR  Reproductive/Obstetrics                           Anesthesia Physical Anesthesia Plan  ASA: II  Anesthesia Plan: General and Regional   Post-op Pain Management: GA combined w/ Regional for post-op pain   Induction: Intravenous  PONV Risk Score and Plan: 1 and Ondansetron, Dexamethasone, Midazolam and Treatment may vary due to age or medical condition  Airway Management Planned: Oral ETT  Additional Equipment:   Intra-op Plan:   Post-operative Plan: Extubation in OR  Informed Consent: I have reviewed the patients History and Physical, chart, labs and discussed the procedure including the risks, benefits and alternatives for the proposed anesthesia with the patient or authorized representative who has indicated his/her understanding and acceptance.     Dental advisory given  Plan Discussed with: CRNA  Anesthesia Plan Comments:        Anesthesia Quick Evaluation

## 2020-07-03 ENCOUNTER — Other Ambulatory Visit: Payer: Self-pay

## 2020-07-03 ENCOUNTER — Encounter (HOSPITAL_BASED_OUTPATIENT_CLINIC_OR_DEPARTMENT_OTHER): Payer: Self-pay | Admitting: Orthopedic Surgery

## 2020-07-03 ENCOUNTER — Ambulatory Visit (HOSPITAL_BASED_OUTPATIENT_CLINIC_OR_DEPARTMENT_OTHER)
Admission: RE | Admit: 2020-07-03 | Discharge: 2020-07-03 | Disposition: A | Discharge status: Home / Self Care | Payer: 59 | Attending: Orthopedic Surgery | Admitting: Orthopedic Surgery

## 2020-07-03 ENCOUNTER — Ambulatory Visit (HOSPITAL_BASED_OUTPATIENT_CLINIC_OR_DEPARTMENT_OTHER): Payer: 59 | Admitting: Anesthesiology

## 2020-07-03 ENCOUNTER — Encounter (HOSPITAL_BASED_OUTPATIENT_CLINIC_OR_DEPARTMENT_OTHER)
Admission: RE | Disposition: A | Discharge status: Home / Self Care | Payer: Self-pay | Source: Home / Self Care | Attending: Orthopedic Surgery

## 2020-07-03 DIAGNOSIS — Z89422 Acquired absence of other left toe(s): Secondary | ICD-10-CM | POA: Diagnosis not present

## 2020-07-03 DIAGNOSIS — M75121 Complete rotator cuff tear or rupture of right shoulder, not specified as traumatic: Secondary | ICD-10-CM | POA: Insufficient documentation

## 2020-07-03 DIAGNOSIS — Z89022 Acquired absence of left finger(s): Secondary | ICD-10-CM | POA: Diagnosis not present

## 2020-07-03 DIAGNOSIS — M19011 Primary osteoarthritis, right shoulder: Secondary | ICD-10-CM | POA: Diagnosis not present

## 2020-07-03 DIAGNOSIS — F17210 Nicotine dependence, cigarettes, uncomplicated: Secondary | ICD-10-CM | POA: Insufficient documentation

## 2020-07-03 DIAGNOSIS — M25811 Other specified joint disorders, right shoulder: Secondary | ICD-10-CM | POA: Insufficient documentation

## 2020-07-03 HISTORY — PX: SHOULDER ARTHROSCOPY WITH OPEN ROTATOR CUFF REPAIR AND DISTAL CLAVICLE ACROMINECTOMY: SHX5683

## 2020-07-03 HISTORY — DX: Unspecified rotator cuff tear or rupture of right shoulder, not specified as traumatic: M75.101

## 2020-07-03 SURGERY — SHOULDER ARTHROSCOPY WITH OPEN ROTATOR CUFF REPAIR AND DISTAL CLAVICLE ACROMINECTOMY
Anesthesia: Regional | Site: Shoulder | Laterality: Right

## 2020-07-03 MED ORDER — ROCURONIUM BROMIDE 10 MG/ML (PF) SYRINGE
PREFILLED_SYRINGE | INTRAVENOUS | Status: DC | PRN
  Administered 2020-07-03: 30 mg via INTRAVENOUS

## 2020-07-03 MED ORDER — BUPIVACAINE HCL (PF) 0.5 % IJ SOLN
INTRAMUSCULAR | Status: DC | PRN
Start: 2020-07-03 — End: 2020-07-03
  Administered 2020-07-03: 15 mL via PERINEURAL

## 2020-07-03 MED ORDER — KETOROLAC TROMETHAMINE 30 MG/ML IJ SOLN: 30.0000 mg | Freq: Once | INTRAMUSCULAR | Status: DC | PRN

## 2020-07-03 MED ORDER — FENTANYL CITRATE (PF) 100 MCG/2ML IJ SOLN
INTRAMUSCULAR | Status: AC
  Filled 2020-07-03: qty 2

## 2020-07-03 MED ORDER — LACTATED RINGERS IV SOLN: INTRAVENOUS | Status: DC

## 2020-07-03 MED ORDER — BUPIVACAINE LIPOSOME 1.3 % IJ SUSP
INTRAMUSCULAR | Status: DC | PRN
  Administered 2020-07-03: 10 mL via PERINEURAL

## 2020-07-03 MED ORDER — SODIUM CHLORIDE 0.9 % IR SOLN
Status: DC | PRN
  Administered 2020-07-03: 3000 mL

## 2020-07-03 MED ORDER — CEFAZOLIN SODIUM-DEXTROSE 2-4 GM/100ML-% IV SOLN
2.0000 g | INTRAVENOUS | Status: AC
  Administered 2020-07-03: 2 g via INTRAVENOUS

## 2020-07-03 MED ORDER — LIDOCAINE 2% (20 MG/ML) 5 ML SYRINGE
INTRAMUSCULAR | Status: AC
  Filled 2020-07-03: qty 5

## 2020-07-03 MED ORDER — MIDAZOLAM HCL 2 MG/2ML IJ SOLN
INTRAMUSCULAR | Status: AC
  Filled 2020-07-03: qty 2

## 2020-07-03 MED ORDER — EPHEDRINE SULFATE-NACL 50-0.9 MG/10ML-% IV SOSY
PREFILLED_SYRINGE | INTRAVENOUS | Status: DC | PRN
  Administered 2020-07-03 (×3): 10 mg via INTRAVENOUS
  Administered 2020-07-03: 5 mg via INTRAVENOUS

## 2020-07-03 MED ORDER — CELECOXIB 200 MG PO CAPS: 200.0000 mg | ORAL_CAPSULE | Freq: Two times a day (BID) | ORAL | 0 refills | Status: AC

## 2020-07-03 MED ORDER — DEXAMETHASONE SODIUM PHOSPHATE 10 MG/ML IJ SOLN
INTRAMUSCULAR | Status: DC | PRN
  Administered 2020-07-03: 6 mg via INTRAVENOUS

## 2020-07-03 MED ORDER — EPHEDRINE 5 MG/ML INJ
INTRAVENOUS | Status: AC
  Filled 2020-07-03: qty 10

## 2020-07-03 MED ORDER — ONDANSETRON HCL 4 MG/2ML IJ SOLN
INTRAMUSCULAR | Status: DC | PRN
  Administered 2020-07-03: 4 mg via INTRAVENOUS

## 2020-07-03 MED ORDER — OXYCODONE HCL 5 MG PO TABS: ORAL_TABLET | ORAL | 0 refills | Status: DC

## 2020-07-03 MED ORDER — PROPOFOL 10 MG/ML IV BOLUS
INTRAVENOUS | Status: AC
  Filled 2020-07-03: qty 20

## 2020-07-03 MED ORDER — SUGAMMADEX SODIUM 200 MG/2ML IV SOLN
INTRAVENOUS | Status: DC | PRN
  Administered 2020-07-03: 200 mg via INTRAVENOUS

## 2020-07-03 MED ORDER — METHOCARBAMOL 500 MG PO TABS: 500.0000 mg | ORAL_TABLET | Freq: Four times a day (QID) | ORAL | 0 refills | Status: DC | PRN

## 2020-07-03 MED ORDER — HYDROMORPHONE HCL 1 MG/ML IJ SOLN: 0.2500 mg | INTRAMUSCULAR | Status: DC | PRN

## 2020-07-03 MED ORDER — SODIUM CHLORIDE 0.9 % IV SOLN: INTRAVENOUS | Status: DC

## 2020-07-03 MED ORDER — OXYCODONE HCL 5 MG/5ML PO SOLN: 5.0000 mg | Freq: Once | ORAL | Status: DC | PRN

## 2020-07-03 MED ORDER — CEFAZOLIN SODIUM-DEXTROSE 2-4 GM/100ML-% IV SOLN
INTRAVENOUS | Status: AC
  Filled 2020-07-03: qty 100

## 2020-07-03 MED ORDER — ONDANSETRON HCL 4 MG/2ML IJ SOLN
INTRAMUSCULAR | Status: AC
  Filled 2020-07-03: qty 2

## 2020-07-03 MED ORDER — FENTANYL CITRATE (PF) 100 MCG/2ML IJ SOLN: 100.0000 ug | Freq: Once | INTRAMUSCULAR | Status: DC

## 2020-07-03 MED ORDER — PROMETHAZINE HCL 25 MG/ML IJ SOLN: 6.2500 mg | INTRAMUSCULAR | Status: DC | PRN

## 2020-07-03 MED ORDER — ROCURONIUM BROMIDE 10 MG/ML (PF) SYRINGE
PREFILLED_SYRINGE | INTRAVENOUS | Status: AC
  Filled 2020-07-03: qty 10

## 2020-07-03 MED ORDER — MIDAZOLAM HCL 2 MG/2ML IJ SOLN
2.0000 mg | Freq: Once | INTRAMUSCULAR | Status: AC
  Administered 2020-07-03: 2 mg via INTRAVENOUS

## 2020-07-03 MED ORDER — ACETAMINOPHEN 500 MG PO TABS
1000.0000 mg | ORAL_TABLET | Freq: Once | ORAL | Status: AC
  Administered 2020-07-03: 1000 mg via ORAL

## 2020-07-03 MED ORDER — OXYCODONE HCL 5 MG PO TABS: 5.0000 mg | ORAL_TABLET | Freq: Once | ORAL | Status: DC | PRN

## 2020-07-03 MED ORDER — FENTANYL CITRATE (PF) 100 MCG/2ML IJ SOLN
INTRAMUSCULAR | Status: DC | PRN
  Administered 2020-07-03 (×2): 50 ug via INTRAVENOUS

## 2020-07-03 MED ORDER — DEXAMETHASONE SODIUM PHOSPHATE 10 MG/ML IJ SOLN
INTRAMUSCULAR | Status: AC
  Filled 2020-07-03: qty 1

## 2020-07-03 MED ORDER — ACETAMINOPHEN 500 MG PO TABS
ORAL_TABLET | ORAL | Status: AC
  Filled 2020-07-03: qty 2

## 2020-07-03 MED ORDER — PROPOFOL 10 MG/ML IV BOLUS
INTRAVENOUS | Status: DC | PRN
  Administered 2020-07-03: 100 mg via INTRAVENOUS

## 2020-07-03 MED ORDER — LIDOCAINE 2% (20 MG/ML) 5 ML SYRINGE
INTRAMUSCULAR | Status: DC | PRN
  Administered 2020-07-03: 40 mg via INTRAVENOUS

## 2020-07-03 SURGICAL SUPPLY — 87 items
AID PSTN UNV HD RSTRNT DISP (MISCELLANEOUS) ×1
ANCH SUT SWLK 19.1X4.75 (Anchor) ×2 IMPLANT
ANCHOR SUT BIO SW 4.75X19.1 (Anchor) ×2 IMPLANT
APL SKNCLS STERI-STRIP NONHPOA (GAUZE/BANDAGES/DRESSINGS) ×1
BENZOIN TINCTURE PRP APPL 2/3 (GAUZE/BANDAGES/DRESSINGS) ×1 IMPLANT
BLADE AVERAGE 25X9 (BLADE) IMPLANT
BLADE SURG 15 STRL LF DISP TIS (BLADE) IMPLANT
BLADE SURG 15 STRL SS (BLADE)
BUR EGG 3PK/BX (BURR) IMPLANT
BUR OVAL 4.0 (BURR) IMPLANT
BUR OVAL CARBIDE 4.0 (BURR) ×1 IMPLANT
BURR OVAL 8 FLU 4.0X13 (MISCELLANEOUS) ×1 IMPLANT
BURR OVAL 8 FLU 5.0X13 (MISCELLANEOUS) IMPLANT
CANNULA 5.75X71 LONG (CANNULA) IMPLANT
CANNULA SHOULDER 7CM (CANNULA) ×2 IMPLANT
CANNULA TWIST IN 8.25X7CM (CANNULA) IMPLANT
CLEANER CAUTERY TIP 5X5 PAD (MISCELLANEOUS) IMPLANT
CLSR STERI-STRIP ANTIMIC 1/2X4 (GAUZE/BANDAGES/DRESSINGS) ×1 IMPLANT
COVER WAND RF STERILE (DRAPES) ×2 IMPLANT
DECANTER SPIKE VIAL GLASS SM (MISCELLANEOUS) IMPLANT
DISSECTOR  3.8MM X 13CM (MISCELLANEOUS)
DISSECTOR 3.8MM X 13CM (MISCELLANEOUS) IMPLANT
DISSECTOR 4.0MM X 13CM (MISCELLANEOUS) ×2 IMPLANT
DRAPE ORTHO SPLIT 77X108 STRL (DRAPES) ×4
DRAPE STERI 35X30 U-POUCH (DRAPES) ×2 IMPLANT
DRAPE SURG 17X23 STRL (DRAPES) ×2 IMPLANT
DRAPE SURG ORHT 6 SPLT 77X108 (DRAPES) ×2 IMPLANT
DRSG COVADERM PLUS 2X2 (GAUZE/BANDAGES/DRESSINGS) ×2 IMPLANT
DRSG EMULSION OIL 3X3 NADH (GAUZE/BANDAGES/DRESSINGS) ×2 IMPLANT
DRSG MEPILEX BORDER 4X4 (GAUZE/BANDAGES/DRESSINGS) ×1 IMPLANT
DRSG MEPILEX BORDER 4X8 (GAUZE/BANDAGES/DRESSINGS) IMPLANT
DRSG PAD ABDOMINAL 8X10 ST (GAUZE/BANDAGES/DRESSINGS) ×2 IMPLANT
DURAPREP 26ML APPLICATOR (WOUND CARE) ×2 IMPLANT
ELECT REM PT RETURN 9FT ADLT (ELECTROSURGICAL) ×2
ELECTRODE REM PT RTRN 9FT ADLT (ELECTROSURGICAL) ×1 IMPLANT
GAUZE SPONGE 4X4 12PLY STRL (GAUZE/BANDAGES/DRESSINGS) ×2 IMPLANT
GLOVE BIO SURGEON STRL SZ7.5 (GLOVE) ×2 IMPLANT
GLOVE BIOGEL PI IND STRL 8 (GLOVE) ×2 IMPLANT
GLOVE BIOGEL PI INDICATOR 8 (GLOVE) ×2
GLOVE SURG ORTHO 8.0 STRL STRW (GLOVE) ×2 IMPLANT
GOWN STRL REUS W/TWL XL LVL3 (GOWN DISPOSABLE) ×4 IMPLANT
MANIFOLD NEPTUNE II (INSTRUMENTS) ×2 IMPLANT
NDL MAYO 6 CRC TAPER PT (NEEDLE) IMPLANT
NDL MAYO CATGUT SZ4 TPR NDL (NEEDLE) IMPLANT
NDL SCORPION MULTI FIRE (NEEDLE) IMPLANT
NEEDLE MAYO 6 CRC TAPER PT (NEEDLE) IMPLANT
NEEDLE MAYO CATGUT SZ4 (NEEDLE) IMPLANT
NEEDLE SCORPION MULTI FIRE (NEEDLE) ×2 IMPLANT
NS IRRIG 1000ML POUR BTL (IV SOLUTION) ×2 IMPLANT
PACK DSU ARTHROSCOPY (CUSTOM PROCEDURE TRAY) ×2 IMPLANT
PAD CLEANER CAUTERY TIP 5X5 (MISCELLANEOUS)
PAD ORTHO SHOULDER 7X19 LRG (SOFTGOODS) IMPLANT
PENCIL BUTTON HOLSTER BLD 10FT (ELECTRODE) IMPLANT
PORT APPOLLO RF 90DEGREE MULTI (SURGICAL WAND) ×2 IMPLANT
RESTRAINT HEAD UNIVERSAL NS (MISCELLANEOUS) ×2 IMPLANT
SET BASIN DAY SURGERY F.S. (CUSTOM PROCEDURE TRAY) ×2 IMPLANT
SLEEVE SCD COMPRESS KNEE MED (MISCELLANEOUS) ×2 IMPLANT
SLING ARM FOAM STRAP LRG (SOFTGOODS) IMPLANT
SLING ARM FOAM STRAP MED (SOFTGOODS) ×1 IMPLANT
SLING ULTRA II MEDIUM (SOFTGOODS) IMPLANT
SLING ULTRA II S (ORTHOPEDIC SUPPLIES) IMPLANT
SPONGE LAP 4X18 RFD (DISPOSABLE) IMPLANT
STRIP CLOSURE SKIN 1/2X4 (GAUZE/BANDAGES/DRESSINGS) IMPLANT
SUCTION FRAZIER HANDLE 10FR (MISCELLANEOUS)
SUCTION TUBE FRAZIER 10FR DISP (MISCELLANEOUS) IMPLANT
SUT BONE WAX W31G (SUTURE) IMPLANT
SUT ETHILON 3 0 PS 1 (SUTURE) IMPLANT
SUT FIBERWIRE #2 38 T-5 BLUE (SUTURE) ×2
SUT MNCRL AB 4-0 PS2 18 (SUTURE) ×2 IMPLANT
SUT MON AB 3-0 SH 27 (SUTURE)
SUT MON AB 3-0 SH27 (SUTURE) IMPLANT
SUT PDS AB 0 CT1 27 (SUTURE) IMPLANT
SUT TICRON 1 T 12 (SUTURE) IMPLANT
SUT TIGER TAPE 7 IN WHITE (SUTURE) IMPLANT
SUT VIC AB 0 CT1 27 (SUTURE)
SUT VIC AB 0 CT1 27XBRD ANBCTR (SUTURE) IMPLANT
SUT VIC AB 1 CT1 27 (SUTURE)
SUT VIC AB 1 CT1 27XBRD ANBCTR (SUTURE) IMPLANT
SUT VIC AB 2-0 CT2 27 (SUTURE) ×4 IMPLANT
SUT VIC AB 2-0 SH 27 (SUTURE)
SUT VIC AB 2-0 SH 27XBRD (SUTURE) IMPLANT
SUTURE FIBERWR #2 38 T-5 BLUE (SUTURE) IMPLANT
TAPE FIBER 2MM 7IN #2 BLUE (SUTURE) IMPLANT
TOWEL OR 17X26 10 PK STRL BLUE (TOWEL DISPOSABLE) ×2 IMPLANT
TUBING ARTHROSCOPY IRRIG 16FT (MISCELLANEOUS) ×2 IMPLANT
WATER STERILE IRR 1000ML POUR (IV SOLUTION) ×2 IMPLANT
YANKAUER SUCT BULB TIP NO VENT (SUCTIONS) IMPLANT

## 2020-07-03 NOTE — Discharge Instructions (Signed)
Information for Discharge Teaching: EXPAREL (bupivacaine liposome injectable suspension)   Your surgeon or anesthesiologist gave you EXPAREL(bupivacaine) to help control your pain after surgery.   EXPAREL is a local anesthetic that provides pain relief by numbing the tissue around the surgical site.  EXPAREL is designed to release pain medication over time and can control pain for up to 72 hours.  Depending on how you respond to EXPAREL, you may require less pain medication during your recovery.  Possible side effects:  Temporary loss of sensation or ability to move in the area where bupivacaine was injected.  Nausea, vomiting, constipation  Rarely, numbness and tingling in your mouth or lips, lightheadedness, or anxiety may occur.  Call your doctor right away if you think you may be experiencing any of these sensations, or if you have other questions regarding possible side effects.  Follow all other discharge instructions given to you by your surgeon or nurse. Eat a healthy diet and drink plenty of water or other fluids.  If you return to the hospital for any reason within 96 hours following the administration of EXPAREL, it is important for health care providers to know that you have received this anesthetic. A teal colored band has been placed on your arm with the date, time and amount of EXPAREL you have received in order to alert and inform your health care providers. Please leave this armband in place for the full 96 hours following administration, and then you may remove the band.Regional Anesthesia Blocks  1. Numbness or the inability to move the "blocked" extremity may last from 3-48 hours after placement. The length of time depends on the medication injected and your individual response to the medication. If the numbness is not going away after 48 hours, call your surgeon.  2. The extremity that is blocked will need to be protected until the numbness is gone and the  Strength  has returned. Because you cannot feel it, you will need to take extra care to avoid injury. Because it may be weak, you may have difficulty moving it or using it. You may not know what position it is in without looking at it while the block is in effect.  3. For blocks in the legs and feet, returning to weight bearing and walking needs to be done carefully. You will need to wait until the numbness is entirely gone and the strength has returned. You should be able to move your leg and foot normally before you try and bear weight or walk. You will need someone to be with you when you first try to ensure you do not fall and possibly risk injury.  4. Bruising and tenderness at the needle site are common side effects and will resolve in a few days.  5. Persistent numbness or new problems with movement should be communicated to the surgeon or the [facility] [phone] [facility] [phone]).Diet: As you were doing prior to hospitalization   Activity: Increase activity slowly as tolerated  No lifting or driving for 6 weeks   Shower: May shower without a dressing on post op day #3, NO SOAKING in tub   Dressing: You may change your dressing on post op day #3.  Then change the dressing daily with sterile 4"x4"s gauze dressing  Or band aids.  Weight Bearing: nonweight bearing right arm.  Remain in sling except for 1. Shower (keep arm close to body) 2. Pendulum exercises (letting the arm hand straight down to your  side and rotating in small circles)  To prevent constipation: you may use a stool softener such as -  Colace ( over the counter) 100 mg by mouth twice a day  Drink plenty of fluids ( prune juice may be helpful) and high fiber foods  Miralax ( over the counter) for constipation as needed.   Precautions: If you experience chest pain or shortness of breath - call 911 immediately For transfer to the hospital emergency department!!  If you develop a fever greater  that 101 F, purulent drainage from wound, increased redness or drainage from wound, or calf pain -- Call the office   Follow- Up Appointment: Please call for an appointment to be seen in 1 week or as previously scheduled  [facility] - [phone]     Post Anesthesia Home Care Instructions  Activity: Get plenty of rest for the remainder of the day. A responsible individual must stay with you for 24 hours following the procedure.  For the next 24 hours, DO NOT: -Drive a car -Advertising copywriter -Drink alcoholic beverages -Take any medication unless instructed by your physician -Make any legal decisions or sign important papers.  Meals: Start with liquid foods such as gelatin or soup. Progress to regular foods as tolerated. Avoid greasy, spicy, heavy foods. If nausea and/or vomiting occur, drink only clear liquids until the nausea and/or vomiting subsides. Call your physician if vomiting continues.  Special Instructions/Symptoms: Your throat may feel dry or sore from the anesthesia or the breathing tube placed in your throat during surgery. If this causes discomfort, gargle with warm salt water. The discomfort should disappear within 24 hours.   Do not take any Tylenol until after 12:00 pm today.

## 2020-07-03 NOTE — Anesthesia Procedure Notes (Signed)
Procedure Name: Intubation Date/Time: 07/03/2020 7:48 AM Performed by: Pearson Grippe, CRNA Pre-anesthesia Checklist: Patient identified, Emergency Drugs available, Suction available and Patient being monitored Patient Re-evaluated:Patient Re-evaluated prior to induction Oxygen Delivery Method: Circle system utilized Preoxygenation: Pre-oxygenation with 100% oxygen Induction Type: IV induction Ventilation: Mask ventilation without difficulty Laryngoscope Size: Miller and 2 Grade View: Grade I Tube type: Oral Tube size: 7.5 mm Number of attempts: 1 Airway Equipment and Method: Stylet and Oral airway Placement Confirmation: ETT inserted through vocal cords under direct vision,  positive ETCO2 and breath sounds checked- equal and bilateral Secured at: 22 cm Tube secured with: Tape Dental Injury: Teeth and Oropharynx as per pre-operative assessment

## 2020-07-03 NOTE — Anesthesia Postprocedure Evaluation (Signed)
Anesthesia Post Note  Patient: [name] [name]  Procedure(s) Performed: SHOULDER ARTHROSCOPY WITH OPEN ROTATOR CUFF REPAIR AND DISTAL CLAVICLE ARTHROSCOPIC DEBRIDEMENT (Right Shoulder)     Patient location during evaluation: PACU Anesthesia Type: Regional and General Level of consciousness: awake and alert Pain management: pain level controlled Vital Signs Assessment: post-procedure vital signs reviewed and stable Respiratory status: spontaneous breathing, nonlabored ventilation, respiratory function stable and patient connected to nasal cannula oxygen Cardiovascular status: blood pressure returned to baseline and stable Postop Assessment: no apparent nausea or vomiting Anesthetic complications: no   No complications documented.  Last Vitals:  Vitals:   07/03/20 1029 07/03/20 1056  BP: (!) 133/94 (!) 135/91  Pulse: 72 79  Resp: 16 16  Temp: (!) 36.4 C (!) 36.4 C  SpO2: 96% 97%    Last Pain:  Vitals:   07/03/20 1056  PainSc: 0-No pain                 Abbygale Lapid P Christopher Hink

## 2020-07-03 NOTE — Transfer of Care (Signed)
Immediate Anesthesia Transfer of Care Note  Patient: [name] [name]  Procedure(s) Performed: SHOULDER ARTHROSCOPY WITH OPEN ROTATOR CUFF REPAIR AND DISTAL CLAVICLE ARTHROSCOPIC DEBRIDEMENT (Right Shoulder)  Patient Location: PACU  Anesthesia Type:GA combined with regional for post-op pain  Level of Consciousness: awake, alert  and oriented  Airway & Oxygen Therapy: Patient Spontanous Breathing and Patient connected to face mask oxygen  Post-op Assessment: Report given to RN and Post -op Vital signs reviewed and stable  Post vital signs: Reviewed and stable  Last Vitals:  Vitals Value Taken Time  BP 152/88 07/03/20 0930  Temp    Pulse 77 07/03/20 0931  Resp 14 07/03/20 0931  SpO2 98 % 07/03/20 0931  Vitals shown include unvalidated device data.  Last Pain: There were no vitals filed for this visit.    Patients Stated Pain Goal: 4 (07/03/20 5501)  Complications: No complications documented.

## 2020-07-03 NOTE — Progress Notes (Signed)
Assisted Dr. Ellender with right, ultrasound guided, interscalene  block. Side rails up, monitors on throughout procedure. See vital signs in flow sheet. Tolerated Procedure well. 

## 2020-07-03 NOTE — Op Note (Signed)
[name] [name], [name] [name] MEDICAL RECORD [id number] ACCOUNT [id number] DATE OF BIRTH:Apr 09, 1961 FACILITY: [facility] LOCATION: [facility] PHYSICIAN:W. Maurice Fotheringham JR., MD  OPERATIVE REPORT  DATE OF PROCEDURE:  07/03/2020  PREOPERATIVE DIAGNOSES: 1.  Preop complete supraspinatus rotator cuff tear. 2.  Impingement. 3.  Acromioclavicular joint arthritis.  POSTOPERATIVE DIAGNOSES:   1.  Preop complete supraspinatus rotator cuff tear. 2.  Impingement. 3.  Acromioclavicular joint arthritis.  OPERATION: 1.  Open rotator cuff repair, acromioplasty. 2.  Open distal clavicle. 3.  Arthroscopic debridement.  SURGEON:  Marcie Mowers, MD  ASSISTANT:  Lonia Chimera, MD   ESTIMATED BLOOD LOSS:  Minimal.  ANESTHESIA:  General with a nerve block.  DESCRIPTION OF PROCEDURE:  Arthroscoped beach chair position.  General anesthetic nerve block with posterior lateral and anterior portals.  The patient had no degenerative changes of the glenohumeral joint, significant fraying of undersurface of the cuff  was debrided.  The patient was noted to have intact biceps.  No evidence of degenerative change.  Intraarticular debridement was carried out followed by conversion of the surgery to an open procedure with an incision bisecting the acromial AC interval.   Severe AC arthropathy was noted preoperatively.  We excised a centimeter of the distal clavicle, performed an acromioplasty, released the deltoid about 2-3 cm distal to the tip of the acromion.  Partial bursectomy carried out revealing the cuff tear,  which was probably about 2 cm.  Edges were freshened with a 15 blade.  We burred it with the tuberosity and then elected to fix it with a medial lateral row technique with 1 double arm 4.75 SwiveLock with 2 FiberTapes medially into a lateral SwiveLock  creating essentially a watertight repair.  Closure of the deltoid was affected with #1 Ethibond, 2-0 Vicryl and Monocryl placed in a sling.  Taken to  recovery room in stable condition.  CN/NUANCE  D:07/03/2020 T:07/03/2020 JOB:[id number]

## 2020-07-03 NOTE — Anesthesia Procedure Notes (Signed)
Anesthesia Regional Block: Interscalene brachial plexus block   Pre-Anesthetic Checklist: ,, timeout performed, Correct Patient, Correct Site, Correct Laterality, Correct Procedure, Correct Position, site marked, Risks and benefits discussed,  Surgical consent,  Pre-op evaluation,  At surgeon's request and post-op pain management  Laterality: Right  Prep: chloraprep       Needles:  Injection technique: Single-shot  Needle Type: Echogenic Stimulator Needle     Needle Length: 10cm  Needle Gauge: 20     Additional Needles:   Procedures:,,,, ultrasound used (permanent image in chart),,,,  Narrative:  Start time: 07/03/2020 7:10 AM End time: 07/03/2020 7:20 AM Injection made incrementally with aspirations every 5 mL.  Performed by: Personally  Anesthesiologist: Leonides Grills, MD  Additional Notes: Functioning IV was confirmed and monitors were applied.  A timeout was performed. Sterile prep, hand hygiene and sterile gloves were used. A 20ga BBraun echogenic stimulator needle was used. Negative aspiration and negative test dose prior to incremental administration of local anesthetic. The patient tolerated the procedure well.  Ultrasound guidance: relevent anatomy identified, needle position confirmed, local anesthetic spread visualized around nerve(s), vascular puncture avoided.  Image printed for medical record.

## 2020-07-03 NOTE — Brief Op Note (Signed)
07/03/2020  9:27 AM  PATIENT:  [name] [name]  59 y.o. male  PRE-OPERATIVE DIAGNOSIS:  RIGHT SHOULDER ROTATOR CUFF REPAIR  POST-OPERATIVE DIAGNOSIS:  RIGHT SHOULDER ROTATOR CUFF REPAIR  PROCEDURE:  Procedure(s): SHOULDER ARTHROSCOPY WITH OPEN ROTATOR CUFF REPAIR AND DISTAL CLAVICLE ARTHROSCOPIC DEBRIDEMENT (Right)  SURGEON:  Surgeon(s) and Role:    Frederico Hamman, MD - Primary  PHYSICIAN ASSISTANT: Margart Sickles, PA-C  ASSISTANTS:    ANESTHESIA:   regional and general  EBL:  20 mL   BLOOD ADMINISTERED:none  DRAINS: none   LOCAL MEDICATIONS USED:  none  SPECIMEN:  No Specimen  DISPOSITION OF SPECIMEN:  N/A  COUNTS:  YES  TOURNIQUET:  * No tourniquets in log *  DICTATION: .Other Dictation: Dictation Number Unknown  PLAN OF CARE: Discharge to home after PACU  PATIENT DISPOSITION:  PACU - hemodynamically stable.   Delay start of Pharmacological VTE agent (>24hrs) due to surgical blood loss or risk of bleeding: not applicable

## 2020-07-03 NOTE — Interval H&P Note (Signed)
History and Physical Interval Note:  07/03/2020 7:46 AM  [name] [name]  has presented today for surgery, with the diagnosis of RIGHT SHOULDER ROTATOR CUFF REPAIR.  The various methods of treatment have been discussed with the patient and family. After consideration of risks, benefits and other options for treatment, the patient has consented to  Procedure(s): SHOULDER ARTHROSCOPY WITH OPEN ROTATOR CUFF REPAIR AND DISTAL CLAVICLE ACROMINECTOMY (Right) as a surgical intervention.  The patient's history has been reviewed, patient examined, no change in status, stable for surgery.  I have reviewed the patient's chart and labs.  Questions were answered to the patient's satisfaction.     Thera Flake

## 2020-07-06 ENCOUNTER — Encounter (HOSPITAL_BASED_OUTPATIENT_CLINIC_OR_DEPARTMENT_OTHER): Payer: Self-pay | Admitting: Orthopedic Surgery

## 2020-10-21 ENCOUNTER — Encounter (HOSPITAL_BASED_OUTPATIENT_CLINIC_OR_DEPARTMENT_OTHER): Payer: Self-pay | Admitting: Orthopedic Surgery

## 2020-10-21 ENCOUNTER — Other Ambulatory Visit: Payer: Self-pay

## 2020-10-22 ENCOUNTER — Ambulatory Visit: Payer: Self-pay | Admitting: Physician Assistant

## 2020-10-22 NOTE — H&P (Signed)
[name] [name] is an 59 y.o. male.   Chief Complaint: right shoulder stiffness HPI: Follow up right shoulder rotator cuff repair and stiffness. History of present illness: Patient is a 59 year-old male who is approaching four months out from the above mentioned surgery.  Pain wise he continues to make some headway, but he is dealing with a lot of stiffness.  He does a physical job.  He is not able to return to work as long as he is not doing well with this.  Physical therapy has spoken to [place] a couple of times now regarding that he does have a firm end point and difficulty getting through this.  Here for treatment recommendations today.    Past Medical History:  Diagnosis Date  . Alcohol abuse   . Drug abuse (HCC)   . Right rotator cuff tear     Past Surgical History:  Procedure Laterality Date  . AMPUTATION Left 05/30/2019   Procedure: REVISION AMPUTATION OF INDEX FINGER;  Surgeon: Betha Loa, MD;  Location: [facility] OR;  Service: Orthopedics;  Laterality: Left;  . SHOULDER ARTHROSCOPY WITH OPEN ROTATOR CUFF REPAIR AND DISTAL CLAVICLE ACROMINECTOMY Right 07/03/2020   Procedure: SHOULDER ARTHROSCOPY WITH OPEN ROTATOR CUFF REPAIR AND DISTAL CLAVICLE ARTHROSCOPIC DEBRIDEMENT;  Surgeon: Frederico Hamman, MD;  Location: [facility];  Service: Orthopedics;  Laterality: Right;  . toe amputaion Left yrs ago    No family history on file. Social History:  reports that he has been smoking cigarettes. He has a 7.50 pack-year smoking history. He has never used smokeless tobacco. He reports current alcohol use of about 3.0 standard drinks of alcohol per week. He reports current drug use. Drug: Marijuana.  Allergies: No Known Allergies  (Not in a hospital admission)   No results found for this or any previous visit (from the past 48 hour(s)). No results found.  Review of Systems  Musculoskeletal: Positive for arthralgias and joint swelling.  All other systems reviewed and are  negative.   There were no vitals taken for this visit. Physical Exam Constitutional:      General: He is not in acute distress.    Appearance: Normal appearance.  HENT:     Head: Normocephalic.  Eyes:     Extraocular Movements: Extraocular movements intact.     Pupils: Pupils are equal, round, and reactive to light.  Cardiovascular:     Rate and Rhythm: Normal rate.     Pulses: Normal pulses.  Pulmonary:     Effort: Pulmonary effort is normal. No respiratory distress.  Abdominal:     General: Abdomen is flat. There is no distension.  Musculoskeletal:     Cervical back: Normal range of motion.     Comments: Patient is seated in the exam room in no acute distress.  Alert and oriented.  Appears appropriate age.  Examination of the right upper extremity shows he is neurovascularly intact.  He has limitation of motion.  He is unable to get to 90 degrees of abduction actively, there is a lot of scaption with this.  He is severely limited as well with his internal rotation, barely to the right rear pocket.  Okay with external rotation and forward flexion only a slight limitation as well.    Skin:    General: Skin is warm and dry.     Findings: No erythema or rash.  Neurological:     General: No focal deficit present.     Mental Status: He is alert.  Psychiatric:        Mood and Affect: Mood normal.        Behavior: Behavior normal.      Assessment/Plan Right shoulder adhesive capsulitis status post rotator cuff repair  Had a long discussion with the patient today.  Risks and benefits of manipulation under anesthesia with an intraarticular Cortisone injection at the time of surgery.  Patient wishes to proceed.  We will set this up on an outpatient surgical basis.  Setup physical therapy with him for the following day.  Patient co-evaluated with Dr. Madelon Lips today who is in agreement with the treatment plan.    Margart Sickles, PA-C 10/22/2020, 4:32 PM

## 2020-10-22 NOTE — H&P (View-Only) (Signed)
[name] [name] is an 59 y.o. male.   Chief Complaint: right shoulder stiffness HPI: Follow up right shoulder rotator cuff repair and stiffness. History of present illness: Patient is a 59 year-old male who is approaching four months out from the above mentioned surgery.  Pain wise he continues to make some headway, but he is dealing with a lot of stiffness.  He does a physical job.  He is not able to return to work as long as he is not doing well with this.  Physical therapy has spoken to [place] a couple of times now regarding that he does have a firm end point and difficulty getting through this.  Here for treatment recommendations today.    Past Medical History:  Diagnosis Date  . Alcohol abuse   . Drug abuse (HCC)   . Right rotator cuff tear     Past Surgical History:  Procedure Laterality Date  . AMPUTATION Left 05/30/2019   Procedure: REVISION AMPUTATION OF INDEX FINGER;  Surgeon: Betha Loa, MD;  Location: [facility] OR;  Service: Orthopedics;  Laterality: Left;  . SHOULDER ARTHROSCOPY WITH OPEN ROTATOR CUFF REPAIR AND DISTAL CLAVICLE ACROMINECTOMY Right 07/03/2020   Procedure: SHOULDER ARTHROSCOPY WITH OPEN ROTATOR CUFF REPAIR AND DISTAL CLAVICLE ARTHROSCOPIC DEBRIDEMENT;  Surgeon: Frederico Hamman, MD;  Location: [facility];  Service: Orthopedics;  Laterality: Right;  . toe amputaion Left yrs ago    No family history on file. Social History:  reports that he has been smoking cigarettes. He has a 7.50 pack-year smoking history. He has never used smokeless tobacco. He reports current alcohol use of about 3.0 standard drinks of alcohol per week. He reports current drug use. Drug: Marijuana.  Allergies: No Known Allergies  (Not in a hospital admission)   No results found for this or any previous visit (from the past 48 hour(s)). No results found.  Review of Systems  Musculoskeletal: Positive for arthralgias and joint swelling.  All other systems reviewed and are  negative.   There were no vitals taken for this visit. Physical Exam Constitutional:      General: He is not in acute distress.    Appearance: Normal appearance.  HENT:     Head: Normocephalic.  Eyes:     Extraocular Movements: Extraocular movements intact.     Pupils: Pupils are equal, round, and reactive to light.  Cardiovascular:     Rate and Rhythm: Normal rate.     Pulses: Normal pulses.  Pulmonary:     Effort: Pulmonary effort is normal. No respiratory distress.  Abdominal:     General: Abdomen is flat. There is no distension.  Musculoskeletal:     Cervical back: Normal range of motion.     Comments: Patient is seated in the exam room in no acute distress.  Alert and oriented.  Appears appropriate age.  Examination of the right upper extremity shows he is neurovascularly intact.  He has limitation of motion.  He is unable to get to 90 degrees of abduction actively, there is a lot of scaption with this.  He is severely limited as well with his internal rotation, barely to the right rear pocket.  Okay with external rotation and forward flexion only a slight limitation as well.    Skin:    General: Skin is warm and dry.     Findings: No erythema or rash.  Neurological:     General: No focal deficit present.     Mental Status: He is alert.  Psychiatric:        Mood and Affect: Mood normal.        Behavior: Behavior normal.      Assessment/Plan Right shoulder adhesive capsulitis status post rotator cuff repair  Had a long discussion with the patient today.  Risks and benefits of manipulation under anesthesia with an intraarticular Cortisone injection at the time of surgery.  Patient wishes to proceed.  We will set this up on an outpatient surgical basis.  Setup physical therapy with him for the following day.  Patient co-evaluated with Dr. Caffrey today who is in agreement with the treatment plan.    Dorma Altman, PA-C 10/22/2020, 4:32 PM   

## 2020-10-24 ENCOUNTER — Other Ambulatory Visit (HOSPITAL_COMMUNITY)
Admission: RE | Admit: 2020-10-24 | Discharge: 2020-10-24 | Disposition: A | Discharge status: Home / Self Care | Payer: 59 | Source: Ambulatory Visit | Attending: Orthopedic Surgery | Admitting: Orthopedic Surgery

## 2020-10-24 DIAGNOSIS — Z01812 Encounter for preprocedural laboratory examination: Secondary | ICD-10-CM | POA: Insufficient documentation

## 2020-10-24 DIAGNOSIS — Z20822 Contact with and (suspected) exposure to covid-19: Secondary | ICD-10-CM | POA: Diagnosis not present

## 2020-10-25 LAB — SARS CORONAVIRUS 2 (TAT 6-24 HRS): SARS Coronavirus 2: NEGATIVE

## 2020-10-28 ENCOUNTER — Encounter (HOSPITAL_BASED_OUTPATIENT_CLINIC_OR_DEPARTMENT_OTHER)
Admission: RE | Disposition: A | Discharge status: Home / Self Care | Payer: Self-pay | Source: Home / Self Care | Attending: Orthopedic Surgery

## 2020-10-28 ENCOUNTER — Ambulatory Visit (HOSPITAL_BASED_OUTPATIENT_CLINIC_OR_DEPARTMENT_OTHER): Payer: 59 | Admitting: Anesthesiology

## 2020-10-28 ENCOUNTER — Ambulatory Visit (HOSPITAL_BASED_OUTPATIENT_CLINIC_OR_DEPARTMENT_OTHER)
Admission: RE | Admit: 2020-10-28 | Discharge: 2020-10-28 | Disposition: A | Discharge status: Home / Self Care | Payer: 59 | Attending: Orthopedic Surgery | Admitting: Orthopedic Surgery

## 2020-10-28 ENCOUNTER — Other Ambulatory Visit: Payer: Self-pay

## 2020-10-28 ENCOUNTER — Encounter (HOSPITAL_BASED_OUTPATIENT_CLINIC_OR_DEPARTMENT_OTHER): Payer: Self-pay | Admitting: Orthopedic Surgery

## 2020-10-28 DIAGNOSIS — Z89022 Acquired absence of left finger(s): Secondary | ICD-10-CM | POA: Diagnosis not present

## 2020-10-28 DIAGNOSIS — M7501 Adhesive capsulitis of right shoulder: Secondary | ICD-10-CM | POA: Insufficient documentation

## 2020-10-28 DIAGNOSIS — M24511 Contracture, right shoulder: Secondary | ICD-10-CM | POA: Insufficient documentation

## 2020-10-28 DIAGNOSIS — F17200 Nicotine dependence, unspecified, uncomplicated: Secondary | ICD-10-CM | POA: Insufficient documentation

## 2020-10-28 HISTORY — PX: SHOULDER CLOSED REDUCTION: SHX1051

## 2020-10-28 SURGERY — MANIPULATION, JOINT, SHOULDER, WITH ANESTHESIA
Anesthesia: Monitor Anesthesia Care | Site: Shoulder | Laterality: Right

## 2020-10-28 MED ORDER — OXYCODONE HCL 5 MG PO TABS
ORAL_TABLET | ORAL | 0 refills | Status: DC
Start: 2020-10-28 — End: 2020-10-28

## 2020-10-28 MED ORDER — LIDOCAINE 2% (20 MG/ML) 5 ML SYRINGE
INTRAMUSCULAR | Status: DC | PRN
  Administered 2020-10-28: 60 mg via INTRAVENOUS

## 2020-10-28 MED ORDER — PROPOFOL 10 MG/ML IV BOLUS
INTRAVENOUS | Status: DC | PRN
  Administered 2020-10-28: 80 mg via INTRAVENOUS

## 2020-10-28 MED ORDER — MIDAZOLAM HCL 2 MG/2ML IJ SOLN
2.0000 mg | Freq: Once | INTRAMUSCULAR | Status: AC
  Administered 2020-10-28: 2 mg via INTRAVENOUS

## 2020-10-28 MED ORDER — ONDANSETRON HCL 4 MG/2ML IJ SOLN: 4.0000 mg | Freq: Once | INTRAMUSCULAR | Status: DC | PRN

## 2020-10-28 MED ORDER — MIDAZOLAM HCL 2 MG/2ML IJ SOLN
INTRAMUSCULAR | Status: AC
  Filled 2020-10-28: qty 2

## 2020-10-28 MED ORDER — LACTATED RINGERS IV SOLN: INTRAVENOUS | Status: DC

## 2020-10-28 MED ORDER — FENTANYL CITRATE (PF) 100 MCG/2ML IJ SOLN
100.0000 ug | Freq: Once | INTRAMUSCULAR | Status: AC
  Administered 2020-10-28: 100 ug via INTRAVENOUS

## 2020-10-28 MED ORDER — MEPERIDINE HCL 25 MG/ML IJ SOLN: 6.2500 mg | INTRAMUSCULAR | Status: DC | PRN

## 2020-10-28 MED ORDER — HYDROMORPHONE HCL 1 MG/ML IJ SOLN: 0.2500 mg | INTRAMUSCULAR | Status: DC | PRN

## 2020-10-28 MED ORDER — FENTANYL CITRATE (PF) 100 MCG/2ML IJ SOLN
INTRAMUSCULAR | Status: DC | PRN
  Administered 2020-10-28: 50 ug via INTRAVENOUS

## 2020-10-28 MED ORDER — BUPIVACAINE-EPINEPHRINE (PF) 0.5% -1:200000 IJ SOLN
INTRAMUSCULAR | Status: DC | PRN
  Administered 2020-10-28: 20 mL via PERINEURAL

## 2020-10-28 MED ORDER — METHYLPREDNISOLONE ACETATE 40 MG/ML IJ SUSP
INTRAMUSCULAR | Status: AC
  Filled 2020-10-28: qty 1

## 2020-10-28 MED ORDER — METHYLPREDNISOLONE ACETATE 80 MG/ML IJ SUSP
INTRAMUSCULAR | Status: AC
  Filled 2020-10-28: qty 1

## 2020-10-28 MED ORDER — METHOCARBAMOL 500 MG PO TABS: 500.0000 mg | ORAL_TABLET | Freq: Four times a day (QID) | ORAL | 0 refills | Status: DC | PRN

## 2020-10-28 MED ORDER — MIDAZOLAM HCL 5 MG/5ML IJ SOLN
INTRAMUSCULAR | Status: DC | PRN
  Administered 2020-10-28: 2 mg via INTRAVENOUS

## 2020-10-28 MED ORDER — FENTANYL CITRATE (PF) 100 MCG/2ML IJ SOLN
INTRAMUSCULAR | Status: AC
  Filled 2020-10-28: qty 2

## 2020-10-28 MED ORDER — BUPIVACAINE LIPOSOME 1.3 % IJ SUSP
INTRAMUSCULAR | Status: DC | PRN
  Administered 2020-10-28: 10 mL via PERINEURAL

## 2020-10-28 SURGICAL SUPPLY — 11 items
BNDG ADH 1X3 SHEER STRL LF (GAUZE/BANDAGES/DRESSINGS) ×1 IMPLANT
BNDG ADH THN 3X1 STRL LF (GAUZE/BANDAGES/DRESSINGS)
GLOVE BIOGEL PI IND STRL 8 (GLOVE) ×2 IMPLANT
GLOVE BIOGEL PI INDICATOR 8 (GLOVE)
NDL SAFETY ECLIPSE 18X1.5 (NEEDLE) ×1 IMPLANT
NEEDLE HYPO 18GX1.5 SHARP (NEEDLE)
NEEDLE HYPO 22GX1.5 SAFETY (NEEDLE) ×1 IMPLANT
PAD ALCOHOL SWAB (MISCELLANEOUS) ×2 IMPLANT
SLING ARM FOAM STRAP LRG (SOFTGOODS) ×1 IMPLANT
SYR 20ML LL LF (SYRINGE) ×1 IMPLANT
SYR CONTROL 10ML LL (SYRINGE) ×1 IMPLANT

## 2020-10-28 NOTE — Transfer of Care (Signed)
Immediate Anesthesia Transfer of Care Note  Patient: [name] [name]  Procedure(s) Performed: CLOSED MANIPULATION SHOULDER (Right Shoulder)  Patient Location: PACU  Anesthesia Type:MAC  Level of Consciousness: awake  Airway & Oxygen Therapy: Patient Spontanous Breathing and Patient connected to face mask oxygen  Post-op Assessment: Report given to RN and Post -op Vital signs reviewed and stable  Post vital signs: Reviewed and stable  Last Vitals:  Vitals Value Taken Time  BP 134/93 10/28/20 1400  Temp 36.4 C 10/28/20 1324  Pulse 67 10/28/20 1400  Resp 14 10/28/20 1400  SpO2 100 % 10/28/20 1400  Vitals shown include unvalidated device data.  Last Pain:  Vitals:   10/28/20 1345  TempSrc:   PainSc: 0-No pain      Patients Stated Pain Goal: 6 (44/81/85 6314)  Complications: No complications documented.

## 2020-10-28 NOTE — Progress Notes (Signed)
Assisted Dr. Ossey with right, ultrasound guided, interscalene  block. Side rails up, monitors on throughout procedure. See vital signs in flow sheet. Tolerated Procedure well. 

## 2020-10-28 NOTE — Brief Op Note (Signed)
10/28/2020  1:22 PM  PATIENT:  [name] [name]  59 y.o. male  PRE-OPERATIVE DIAGNOSIS:  RIGHT SHOULDER CONTRACTURE  POST-OPERATIVE DIAGNOSIS:  RIGHT SHOULDER CONTRACTURE  PROCEDURE:  Procedure(s): CLOSED MANIPULATION SHOULDER (Right)  SURGEON:  Surgeon(s) and Role:    * Frederico Hamman, MD - Primary  PHYSICIAN ASSISTANT:   ASSISTANTS: none   ANESTHESIA:   regional and IV sedation  EBL:  None   BLOOD ADMINISTERED:none  DRAINS: none   LOCAL MEDICATIONS USED:  NONE  SPECIMEN:  No Specimen  DISPOSITION OF SPECIMEN:  N/A  COUNTS:  YES  TOURNIQUET:  * No tourniquets in log *  DICTATION: .Other Dictation: Dictation Number unknown  PLAN OF CARE: Discharge to home after PACU  PATIENT DISPOSITION:  PACU - hemodynamically stable.   Delay start of Pharmacological VTE agent (>24hrs) due to surgical blood loss or risk of bleeding: not applicable

## 2020-10-28 NOTE — Anesthesia Procedure Notes (Signed)
Anesthesia Regional Block: Interscalene brachial plexus block   Pre-Anesthetic Checklist: ,, timeout performed, Correct Patient, Correct Site, Correct Laterality, Correct Procedure, Correct Position, site marked, Risks and benefits discussed,  Surgical consent,  Pre-op evaluation,  At surgeon's request and post-op pain management  Laterality: Right  Prep: chloraprep       Needles:  Injection technique: Single-shot  Needle Type: Other     Needle Length: 9cm  Needle Gauge: 21   Needle insertion depth: 5 cm   Additional Needles:   Procedures:, nerve stimulator,,,,,,,  Narrative:  Start time: 10/28/2020 11:31 AM End time: 10/28/2020 11:41 AM Injection made incrementally with aspirations every 5 mL.  Performed by: Personally  Anesthesiologist: Arta Bruce, MD  Additional Notes: Monitors applied. Patient sedated. Sterile prep and drape,hand hygiene and sterile gloves were used. Needle position confirmed with evoked response at 0.4 mV.Local anesthetic injected incrementally after negative aspiration.Vascular puncture avoided. No complications. The patient tolerated the procedure well.

## 2020-10-28 NOTE — Discharge Instructions (Signed)
Diet: As you were doing prior to hospitalization   Activity: Increase activity slowly as tolerated  No lifting or driving for 48 hours  Encourage range of motion early and often  Weight Bearing: weight bearing as taught in physical therapy.  To prevent constipation: you may use a stool softener such as -  Colace ( over the counter) 100 mg by mouth twice a day  Drink plenty of fluids ( prune juice may be helpful) and high fiber foods  Miralax ( over the counter) for constipation as needed.   Precautions: If you experience chest pain or shortness of breath - call 911 immediately For transfer to the hospital emergency department!!  If you develop a fever greater that 101 F, purulent drainage from wound, increased redness or drainage from wound, or calf pain -- Call the office   Follow- Up Appointment: Please call for an appointment to be seen in 2 weeks or as previously scheduled [facility] - [phone]    Post Anesthesia Home Care Instructions  Activity: Get plenty of rest for the remainder of the day. A responsible individual must stay with you for 24 hours following the procedure.  For the next 24 hours, DO NOT: -Drive a car -Advertising copywriter -Drink alcoholic beverages -Take any medication unless instructed by your physician -Make any legal decisions or sign important papers.  Meals: Start with liquid foods such as gelatin or soup. Progress to regular foods as tolerated. Avoid greasy, spicy, heavy foods. If nausea and/or vomiting occur, drink only clear liquids until the nausea and/or vomiting subsides. Call your physician if vomiting continues.  Special Instructions/Symptoms: Your throat may feel dry or sore from the anesthesia or the breathing tube placed in your throat during surgery. If this causes discomfort, gargle with warm salt water. The discomfort should disappear within 24 hours.  If you had a scopolamine patch placed behind your ear for the management of post-  operative nausea and/or vomiting:  1. The medication in the patch is effective for 72 hours, after which it should be removed.  Wrap patch in a tissue and discard in the trash. Wash hands thoroughly with soap and water. 2. You may remove the patch earlier than 72 hours if you experience unpleasant side effects which may include dry mouth, dizziness or visual disturbances. 3. Avoid touching the patch. Wash your hands with soap and water after contact with the patch.    Regional Anesthesia Blocks  1. Numbness or the inability to move the "blocked" extremity may last from 3-48 hours after placement. The length of time depends on the medication injected and your individual response to the medication. If the numbness is not going away after 48 hours, call your surgeon.  2. The extremity that is blocked will need to be protected until the numbness is gone and the  Strength has returned. Because you cannot feel it, you will need to take extra care to avoid injury. Because it may be weak, you may have difficulty moving it or using it. You may not know what position it is in without looking at it while the block is in effect.  3. For blocks in the legs and feet, returning to weight bearing and walking needs to be done carefully. You will need to wait until the numbness is entirely gone and the strength has returned. You should be able to move your leg and foot normally before you try and bear weight or walk. You will need someone to be with you when  you first try to ensure you do not fall and possibly risk injury.  4. Bruising and tenderness at the needle site are common side effects and will resolve in a few days.  5. Persistent numbness or new problems with movement should be communicated to the surgeon or the [facility] [phone] [facility] [phone]).   Information for Discharge Teaching: EXPAREL (bupivacaine liposome injectable suspension)   Your surgeon or  anesthesiologist gave you EXPAREL(bupivacaine) to help control your pain after surgery.   EXPAREL is a local anesthetic that provides pain relief by numbing the tissue around the surgical site.  EXPAREL is designed to release pain medication over time and can control pain for up to 72 hours.  Depending on how you respond to EXPAREL, you may require less pain medication during your recovery.  Possible side effects:  Temporary loss of sensation or ability to move in the area where bupivacaine was injected.  Nausea, vomiting, constipation  Rarely, numbness and tingling in your mouth or lips, lightheadedness, or anxiety may occur.  Call your doctor right away if you think you may be experiencing any of these sensations, or if you have other questions regarding possible side effects.  Follow all other discharge instructions given to you by your surgeon or nurse. Eat a healthy diet and drink plenty of water or other fluids.  If you return to the hospital for any reason within 96 hours following the administration of EXPAREL, it is important for health care providers to know that you have received this anesthetic. A teal colored band has been placed on your arm with the date, time and amount of EXPAREL you have received in order to alert and inform your health care providers. Please leave this armband in place for the full 96 hours following administration, and then you may remove the band.

## 2020-10-28 NOTE — Op Note (Signed)
[place]AM[place], CAMPAS MEDICAL RECORD TD:[id number] ACCOUNT [id number] DATE OF BIRTH:25-Mar-1961 FACILITY: [facility] LOCATION: [facility] PHYSICIAN:W. Brek Reece JR., MD  OPERATIVE REPORT  DATE OF PROCEDURE:  10/28/2020  PREOPERATIVE DIAGNOSIS:  Adhesive capsulitis, right shoulder.  POSTOPERATIVE DIAGNOSIS:  Adhesive capsulitis, right shoulder.  OPERATION:  Manipulation under anesthesia.   SURGEON:  Marcie Mowers, MD   COMPLICATIONS:  None.  DESCRIPTION OF PROCEDURE:  After general anesthetic and a block, the patient was examined under anesthesia.  He could only abduct to about 70 degrees passively with little, if any, external rotation or internal rotation.  Post-manipulation 160 abduction,  external rotation 90, internal rotation 45.  No complications.  Taken to recovery room in stable condition.  VN/NUANCE  D:10/28/2020 T:10/28/2020 JOB:[id number]

## 2020-10-28 NOTE — Interval H&P Note (Signed)
History and Physical Interval Note:  10/28/2020 1:07 PM  [name] [name]  has presented today for surgery, with the diagnosis of RIGHT SHOULDER CONTRACTURE.  The various methods of treatment have been discussed with the patient and family. After consideration of risks, benefits and other options for treatment, the patient has consented to  Procedure(s): CLOSED MANIPULATION SHOULDER (Right) as a surgical intervention.  The patient's history has been reviewed, patient examined, no change in status, stable for surgery.  I have reviewed the patient's chart and labs.  Questions were answered to the patient's satisfaction.     Thera Flake

## 2020-10-28 NOTE — Anesthesia Preprocedure Evaluation (Addendum)
Anesthesia Evaluation  Patient identified by MRN, date of birth, ID band Patient awake    Reviewed: Allergy & Precautions, NPO status , Patient's Chart, lab work & pertinent test results  Airway Mallampati: I  TM Distance: >3 FB Neck ROM: Full    Dental   Pulmonary Current Smoker and Patient abstained from smoking.,    Pulmonary exam normal        Cardiovascular Normal cardiovascular exam     Neuro/Psych    GI/Hepatic   Endo/Other    Renal/GU      Musculoskeletal   Abdominal   Peds  Hematology   Anesthesia Other Findings   Reproductive/Obstetrics                             Anesthesia Physical Anesthesia Plan  ASA: III  Anesthesia Plan: Regional   Post-op Pain Management:    Induction: Intravenous  PONV Risk Score and Plan: Ondansetron  Airway Management Planned: Nasal Cannula  Additional Equipment:   Intra-op Plan:   Post-operative Plan:   Informed Consent: I have reviewed the patients History and Physical, chart, labs and discussed the procedure including the risks, benefits and alternatives for the proposed anesthesia with the patient or authorized representative who has indicated his/her understanding and acceptance.       Plan Discussed with: CRNA and Surgeon  Anesthesia Plan Comments:        Anesthesia Quick Evaluation

## 2020-10-28 NOTE — Anesthesia Postprocedure Evaluation (Signed)
Anesthesia Post Note  Patient: [name] [name]  Procedure(s) Performed: CLOSED MANIPULATION SHOULDER (Right Shoulder)     Patient location during evaluation: PACU Anesthesia Type: MAC Level of consciousness: awake and alert Pain management: pain level controlled Vital Signs Assessment: post-procedure vital signs reviewed and stable Respiratory status: spontaneous breathing, nonlabored ventilation, respiratory function stable and patient connected to nasal cannula oxygen Cardiovascular status: stable and blood pressure returned to baseline Postop Assessment: no apparent nausea or vomiting Anesthetic complications: no   No complications documented.  Last Vitals:  Vitals:   10/28/20 1345 10/28/20 1400  BP: (!) 134/97 (!) 134/93  Pulse: 69 65  Resp: 14 14  Temp:    SpO2: 100% 100%    Last Pain:  Vitals:   10/28/20 1345  TempSrc:   PainSc: 0-No pain                 Meelah Tallo DAVID

## 2020-10-29 ENCOUNTER — Encounter (HOSPITAL_BASED_OUTPATIENT_CLINIC_OR_DEPARTMENT_OTHER): Payer: Self-pay | Admitting: Orthopedic Surgery

## 2020-10-29 NOTE — Addendum Note (Signed)
Addendum  created 10/29/20 1209 by Lauralyn Primes, CRNA   Charge Capture section accepted

## 2020-12-26 DIAGNOSIS — I729 Aneurysm of unspecified site: Secondary | ICD-10-CM

## 2020-12-26 HISTORY — DX: Aneurysm of unspecified site: I72.9

## 2020-12-29 ENCOUNTER — Other Ambulatory Visit (HOSPITAL_COMMUNITY): Payer: Self-pay | Admitting: Physician Assistant

## 2020-12-29 MED FILL — traMADol HCL 50 MG TABS: 50 | 15 days supply | Qty: 30 | Fill #0

## 2021-04-26 NOTE — Op Note (Signed)
NAME: [name] [name], [name] [name] MEDICAL RECORD NO: [id number] ACCOUNT NO: [id number] DATE OF BIRTH: 1961/06/30 PHYSICIAN: W D. Carloyn Manner., MD  Operative Report   DATE OF PROCEDURE: 07/03/2020  ADDENDUM  Date of the original surgery was 07/03/2020.  The assistant should be [employer], PA-C.  Assistant is incorrectly noted on the record to be a doctor that is not involved with the case.  Again, this is a correction/addendum to this operative report and there are description.   Note, in the record,  this case was incorrectly noted to have a physician assistant.  Specifically, first assistant was the PA.  The PA was used to manipulate retractors as this was an open cuff repair, requiring retraction of the deltoid as well as intraoperatively for  traction intraarticularly.  Additionally, the assistant was involved in passing sutures through and through to the rotator cuff, retrieving them and then for the final fixation as well on the lateral side using the medial, lateral row technique, which is  indeed something requiring an assistant and was a PA assistant on an open procedure. Again, this should be added as an addendum to this record and a correction as to who the assistant was.   SHW D: 04/23/2021 9:54:51 am T: 04/24/2021 2:07:00 am  JOB: [id number]/ [id number]

## 2021-05-05 ENCOUNTER — Emergency Department (HOSPITAL_COMMUNITY): Payer: BC Managed Care – PPO

## 2021-05-05 ENCOUNTER — Emergency Department (HOSPITAL_COMMUNITY)
Admission: EM | Admit: 2021-05-05 | Discharge: 2021-05-05 | Disposition: A | Discharge status: Home / Self Care | Payer: BC Managed Care – PPO | Attending: Emergency Medicine | Admitting: Emergency Medicine

## 2021-05-05 ENCOUNTER — Encounter (HOSPITAL_COMMUNITY): Payer: Self-pay

## 2021-05-05 ENCOUNTER — Other Ambulatory Visit: Payer: Self-pay

## 2021-05-05 DIAGNOSIS — J0100 Acute maxillary sinusitis, unspecified: Secondary | ICD-10-CM | POA: Diagnosis not present

## 2021-05-05 DIAGNOSIS — G4489 Other headache syndrome: Secondary | ICD-10-CM | POA: Diagnosis not present

## 2021-05-05 DIAGNOSIS — F17210 Nicotine dependence, cigarettes, uncomplicated: Secondary | ICD-10-CM | POA: Insufficient documentation

## 2021-05-05 DIAGNOSIS — Z20822 Contact with and (suspected) exposure to covid-19: Secondary | ICD-10-CM | POA: Diagnosis not present

## 2021-05-05 DIAGNOSIS — I10 Essential (primary) hypertension: Secondary | ICD-10-CM

## 2021-05-05 DIAGNOSIS — R42 Dizziness and giddiness: Secondary | ICD-10-CM | POA: Insufficient documentation

## 2021-05-05 DIAGNOSIS — R519 Headache, unspecified: Secondary | ICD-10-CM | POA: Diagnosis present

## 2021-05-05 DIAGNOSIS — F10929 Alcohol use, unspecified with intoxication, unspecified: Secondary | ICD-10-CM | POA: Insufficient documentation

## 2021-05-05 LAB — RESP PANEL BY RT-PCR (FLU A&B, COVID) ARPGX2
Influenza A by PCR: NEGATIVE
Influenza B by PCR: NEGATIVE
SARS Coronavirus 2 by RT PCR: NEGATIVE

## 2021-05-05 LAB — DIFFERENTIAL
Abs Immature Granulocytes: 0.02 10*3/uL (ref 0.00–0.07)
Basophils Absolute: 0.1 10*3/uL (ref 0.0–0.1)
Basophils Relative: 1 %
Eosinophils Absolute: 0.2 10*3/uL (ref 0.0–0.5)
Eosinophils Relative: 3 %
Immature Granulocytes: 0 %
Lymphocytes Relative: 13 %
Lymphs Abs: 0.8 10*3/uL (ref 0.7–4.0)
Monocytes Absolute: 0.8 10*3/uL (ref 0.1–1.0)
Monocytes Relative: 14 %
Neutro Abs: 4.1 10*3/uL (ref 1.7–7.7)
Neutrophils Relative %: 69 %

## 2021-05-05 LAB — COMPREHENSIVE METABOLIC PANEL
ALT: 16 U/L (ref 0–44)
AST: 22 U/L (ref 15–41)
Albumin: 3.7 g/dL (ref 3.5–5.0)
Alkaline Phosphatase: 48 U/L (ref 38–126)
Anion gap: 11 (ref 5–15)
BUN: 5 mg/dL — ABNORMAL LOW (ref 6–20)
CO2: 20 mmol/L — ABNORMAL LOW (ref 22–32)
Calcium: 8.7 mg/dL — ABNORMAL LOW (ref 8.9–10.3)
Chloride: 105 mmol/L (ref 98–111)
Creatinine, Ser: 0.66 mg/dL (ref 0.61–1.24)
GFR, Estimated: >60 mL/min (ref >60)
Glucose, Bld: 105 mg/dL — ABNORMAL HIGH (ref 70–99)
Potassium: 3.5 mmol/L (ref 3.5–5.1)
Sodium: 136 mmol/L (ref 135–145)
Total Bilirubin: 0.2 mg/dL — ABNORMAL LOW (ref 0.3–1.2)
Total Protein: 6.4 g/dL — ABNORMAL LOW (ref 6.5–8.1)

## 2021-05-05 LAB — CBC
HCT: 40.8 % (ref 39.0–52.0)
Hemoglobin: 14 g/dL (ref 13.0–17.0)
MCH: 32.9 pg (ref 26.0–34.0)
MCHC: 34.3 g/dL (ref 30.0–36.0)
MCV: 95.8 fL (ref 80.0–100.0)
Platelets: 303 10*3/uL (ref 150–400)
RBC: 4.26 MIL/uL (ref 4.22–5.81)
RDW: 12.6 % (ref 11.5–15.5)
WBC: 6 10*3/uL (ref 4.0–10.5)
nRBC: 0 % (ref 0.0–0.2)

## 2021-05-05 LAB — ETHANOL: Alcohol, Ethyl (B): <10 mg/dL (ref <10)

## 2021-05-05 MED ORDER — METOCLOPRAMIDE HCL 5 MG/ML IJ SOLN
10.0000 mg | Freq: Once | INTRAMUSCULAR | Status: AC
  Administered 2021-05-05: 10 mg via INTRAVENOUS
  Filled 2021-05-05: qty 2

## 2021-05-05 MED ORDER — KETOROLAC TROMETHAMINE 30 MG/ML IJ SOLN
30.0000 mg | Freq: Once | INTRAMUSCULAR | Status: AC
  Administered 2021-05-05: 30 mg via INTRAVENOUS
  Filled 2021-05-05: qty 1

## 2021-05-05 MED ORDER — DOXYCYCLINE HYCLATE 100 MG PO CAPS: 100.0000 mg | ORAL_CAPSULE | Freq: Two times a day (BID) | ORAL | 0 refills | Status: DC

## 2021-05-05 MED ORDER — DIPHENHYDRAMINE HCL 50 MG/ML IJ SOLN
25.0000 mg | Freq: Once | INTRAMUSCULAR | Status: AC
  Administered 2021-05-05: 25 mg via INTRAVENOUS
  Filled 2021-05-05: qty 1

## 2021-05-05 NOTE — Discharge Instructions (Addendum)

## 2021-05-05 NOTE — ED Triage Notes (Addendum)
Pt states sudden onset of a headache started 3 days ago.Pt denies any N&V. Pt denies any dizziness. Pt states he called EMS yesterday due to his headache and they gave him tylenol and left. Pt reports taking tylenol with no relief. Pt neuro intact. Pt denies any slurred speech, facial drop or weakness. Pt is hypertensive in triage. Pt BP was done multiple time with high readings. Pt denies any PMH.

## 2021-05-05 NOTE — ED Provider Notes (Signed)
[facility] EMERGENCY DEPARTMENT Provider Note   CSN: [id number] Arrival date & time: 05/05/21  0448     History Chief Complaint  Patient presents with  . Headache    [name] [name] is a 60 y.o. male.  The history is provided by the patient.  Headache Pain location:  Generalized Quality:  Dull Radiates to:  Does not radiate Onset quality:  Sudden Duration:  2 days Timing:  Constant Progression:  Worsening Chronicity:  New Relieved by:  Nothing Worsened by:  Nothing Associated symptoms: congestion and dizziness   Associated symptoms: no abdominal pain, no blurred vision, no cough, no nausea, no neck stiffness, no vomiting and no weakness   Patient presents with headache.  He reports approximately 2 nights ago he went to bed feeling at his baseline he woke up with a headache.  He reports it has been persistent since that time.  No recent falls or trauma.  No focal weakness.  No visual changes.  He does report dizziness upon standing.  No cough or sore throat but does have nasal congestion.  He reports he does not typically get headaches Patient reports he called EMS and after their evaluation they gave him "baby Tylenol "and left.  He reports they told him he had a fever but he is unsure what his temperature was at that time  Patient admits to daily alcohol use.  He reports he drinks several beers per day.  Over the past 24 hours he has only had 1 beer.  He reports he quit using cocaine a year ago.  He does admit to marijuana use He reports recent evaluation by his primary care doctor and was told that he is "the picture of health"    Past Medical History:  Diagnosis Date  . Alcohol abuse   . Drug abuse ([facility])   . Right rotator cuff tear     Patient Active Problem List   Diagnosis Date Noted  . Substance induced mood disorder (HCC) 10/31/2016    Past Surgical History:  Procedure Laterality Date  . AMPUTATION Left 05/30/2019   Procedure: REVISION  AMPUTATION OF INDEX FINGER;  Surgeon: Betha Loa, MD;  Location: [facility] OR;  Service: Orthopedics;  Laterality: Left;  . SHOULDER ARTHROSCOPY WITH OPEN ROTATOR CUFF REPAIR AND DISTAL CLAVICLE ACROMINECTOMY Right 07/03/2020   Procedure: SHOULDER ARTHROSCOPY WITH OPEN ROTATOR CUFF REPAIR AND DISTAL CLAVICLE ARTHROSCOPIC DEBRIDEMENT;  Surgeon: Frederico Hamman, MD;  Location: [facility];  Service: Orthopedics;  Laterality: Right;  . SHOULDER CLOSED REDUCTION Right 10/28/2020   Procedure: CLOSED MANIPULATION SHOULDER;  Surgeon: Frederico Hamman, MD;  Location: [facility];  Service: Orthopedics;  Laterality: Right;  . toe amputaion Left yrs ago       History reviewed. No pertinent family history.  Social History   Tobacco Use  . Smoking status: Current Every Day Smoker    Packs/day: 0.25    Years: 30.00    Pack years: 7.50    Types: Cigarettes  . Smokeless tobacco: Never Used  Vaping Use  . Vaping Use: Never used  Substance Use Topics  . Alcohol use: Yes    Alcohol/week: 3.0 standard drinks    Types: 3 Cans of beer per week    Comment: daily 3-5 beers per day, sometimes none  . Drug use: Yes    Types: Marijuana    Comment: former cocaine/crack last used yrs ago, marijuana last used 06-25-2020    Home Medications Prior to Admission  medications   Not on File    Allergies    Patient has no known allergies.  Review of Systems   Review of Systems  HENT: Positive for congestion.   Eyes: Negative for blurred vision and visual disturbance.  Respiratory: Negative for cough.   Gastrointestinal: Negative for abdominal pain, nausea and vomiting.  Musculoskeletal: Negative for neck stiffness.  Neurological: Positive for dizziness and headaches. Negative for weakness.  All other systems reviewed and are negative.   Physical Exam Updated Vital Signs BP (!) 152/103   Pulse 74   Temp 97.8 F (36.6 C) (Oral)   Resp 16   SpO2 100%   Physical  Exam CONSTITUTIONAL: Well developed/well nourished, uncomfortable appearing HEAD: Normocephalic/atraumatic EYES: EOMI/PERRL, no nystagmus, no ptosis, no corneal haziness ENMT: Mucous membranes moist NECK: supple no meningeal signs SPINE/BACK:entire spine nontender CV: S1/S2 noted, no murmurs/rubs/gallops noted LUNGS: Lungs are clear to auscultation bilaterally, no apparent distress ABDOMEN: soft, nontender, no rebound or guarding GU:no cva tenderness NEURO:Awake/alert, face symmetric, no arm or leg drift is noted Equal 5/5 strength with shoulder abduction, elbow flex/extension, wrist flex/extension in upper extremities and equal hand grips bilaterally Equal 5/5 strength with hip flexion,knee flex/extension, foot dorsi/plantar flexion Cranial nerves 3/4/5/6/07/03/09/11/12 tested and intact Gait normal without ataxia No past pointing Sensation to light touch intact in all extremities EXTREMITIES: pulses normal, full ROM SKIN: warm, color normal PSYCH: Mildly anxious   ED Results / Procedures / Treatments   Labs (all labs ordered are listed, but only abnormal results are displayed) Labs Reviewed  RESP PANEL BY RT-PCR (FLU A&B, COVID) ARPGX2  ETHANOL  CBC  DIFFERENTIAL  COMPREHENSIVE METABOLIC PANEL    EKG EKG Interpretation  Date/Time:  Wednesday May 05 2021 05:58:50 EDT Ventricular Rate:  69 PR Interval:  187 QRS Duration: 108 QT Interval:  376 QTC Calculation: 403 R Axis:   17 Text Interpretation: Sinus rhythm Abnormal R-wave progression, early transition Confirmed by Zadie Rhine ([id number]) on 05/05/2021 6:19:53 AM   Radiology CT HEAD WO CONTRAST  Result Date: 05/05/2021 CLINICAL DATA:  Headache.  Intracranial hemorrhage suspected. EXAM: CT HEAD WITHOUT CONTRAST TECHNIQUE: Contiguous axial images were obtained from the base of the skull through the vertex without intravenous contrast. COMPARISON:  CT head 12/07/2020 report without imaging FINDINGS: Brain: No evidence  of large-territorial acute infarction. No parenchymal hemorrhage. No mass lesion. No extra-axial collection. No mass effect or midline shift. No hydrocephalus. Basilar cisterns are patent. Vascular: No hyperdense vessel. Skull: No acute fracture or focal lesion. Sinuses/Orbits: Left maxillary sinus mucosal thickening. Bilateral ethmoid sinus and right sphenoid sinus mucosal thickening. Paranasal sinuses and mastoid air cells are clear. The orbits are unremarkable. Other: None. IMPRESSION: 1. No acute intracranial abnormality. 2. Sinus mucosal thickening. Correlate with signs and symptoms of acute sinusitis. Electronically Signed   By: Tish Frederickson M.D.   On: 05/05/2021 05:57    Procedures Procedures   Medications Ordered in ED Medications  metoCLOPramide (REGLAN) injection 10 mg (10 mg Intravenous Given 05/05/21 0605)  diphenhydrAMINE (BENADRYL) injection 25 mg (25 mg Intravenous Given 05/05/21 0606)  ketorolac (TORADOL) 30 MG/ML injection 30 mg (30 mg Intravenous Given 05/05/21 9767)    ED Course  I have reviewed the triage vital signs and the nursing notes.  Pertinent labs & imaging results that were available during my care of the patient were reviewed by me and considered in my medical decision making (see chart for details).    MDM Rules/Calculators/A&P  Patient presents with headache and slight but improving.  He has no neurodeficits.  Since patient does not typically get headaches and appears uncomfortable on initial exam, CT head ordered.  CT head negative, Except for possible sinusitis. Patient admits to daily alcohol use which may be contributing to symptoms. Labs are pending at this time. 7:15 AM Signed out to dr Particia Nearing at shift change Will treat for sinusitis as this may cause some of his HA  No signs of acute neurologic emergency   Final Clinical Impression(s) / ED Diagnoses Final diagnoses:  Other headache syndrome  Acute non-recurrent  maxillary sinusitis    Rx / DC Orders ED Discharge Orders         Ordered    doxycycline (VIBRAMYCIN) 100 MG capsule  2 times daily        05/05/21 1157           Zadie Rhine, MD 05/05/21 [phone]

## 2021-05-05 NOTE — ED Provider Notes (Signed)
Pt signed out by Dr. Bebe Shaggy pending symptomatic improvement and Covid results.  Covid neg.  Pt is feeling much better.  BP is still slightly elevated, but is improved following pain relief.  He is to f/u with pcp regarding bp.  Pt is stable for d/c.  Return if worse.   Jacalyn Lefevre, MD 05/05/21 [phone]

## 2021-05-09 ENCOUNTER — Encounter (HOSPITAL_COMMUNITY): Payer: Self-pay

## 2021-05-09 ENCOUNTER — Other Ambulatory Visit: Payer: Self-pay

## 2021-05-09 ENCOUNTER — Emergency Department (HOSPITAL_COMMUNITY)
Admission: EM | Admit: 2021-05-09 | Discharge: 2021-05-10 | Disposition: A | Discharge status: Home / Self Care | Payer: BC Managed Care – PPO | Attending: Emergency Medicine | Admitting: Emergency Medicine

## 2021-05-09 DIAGNOSIS — R519 Headache, unspecified: Secondary | ICD-10-CM | POA: Diagnosis not present

## 2021-05-09 DIAGNOSIS — F17210 Nicotine dependence, cigarettes, uncomplicated: Secondary | ICD-10-CM | POA: Insufficient documentation

## 2021-05-09 DIAGNOSIS — Z20822 Contact with and (suspected) exposure to covid-19: Secondary | ICD-10-CM | POA: Diagnosis not present

## 2021-05-09 DIAGNOSIS — R059 Cough, unspecified: Secondary | ICD-10-CM | POA: Diagnosis not present

## 2021-05-09 NOTE — ED Triage Notes (Signed)
Patient reports sinus headache x 6 days, reports he was seen on Wednesday and prescribed abx which he has been taking but is not feeling any better

## 2021-05-10 ENCOUNTER — Emergency Department (HOSPITAL_COMMUNITY): Payer: BC Managed Care – PPO

## 2021-05-10 LAB — CBC WITH DIFFERENTIAL/PLATELET
Abs Immature Granulocytes: 0.05 10*3/uL (ref 0.00–0.07)
Basophils Absolute: 0.1 10*3/uL (ref 0.0–0.1)
Basophils Relative: 1 %
Eosinophils Absolute: 0.2 10*3/uL (ref 0.0–0.5)
Eosinophils Relative: 2 %
HCT: 39.6 % (ref 39.0–52.0)
Hemoglobin: 13.6 g/dL (ref 13.0–17.0)
Immature Granulocytes: 1 %
Lymphocytes Relative: 20 %
Lymphs Abs: 1.8 10*3/uL (ref 0.7–4.0)
MCH: 32.9 pg (ref 26.0–34.0)
MCHC: 34.3 g/dL (ref 30.0–36.0)
MCV: 95.7 fL (ref 80.0–100.0)
Monocytes Absolute: 0.7 10*3/uL (ref 0.1–1.0)
Monocytes Relative: 8 %
Neutro Abs: 6.3 10*3/uL (ref 1.7–7.7)
Neutrophils Relative %: 68 %
Platelets: 340 10*3/uL (ref 150–400)
RBC: 4.14 MIL/uL — ABNORMAL LOW (ref 4.22–5.81)
RDW: 12.6 % (ref 11.5–15.5)
WBC: 9.1 10*3/uL (ref 4.0–10.5)
nRBC: 0 % (ref 0.0–0.2)

## 2021-05-10 LAB — ETHANOL: Alcohol, Ethyl (B): <10 mg/dL (ref <10)

## 2021-05-10 LAB — BASIC METABOLIC PANEL
Anion gap: 7 (ref 5–15)
BUN: 6 mg/dL (ref 6–20)
CO2: 26 mmol/L (ref 22–32)
Calcium: 8.9 mg/dL (ref 8.9–10.3)
Chloride: 103 mmol/L (ref 98–111)
Creatinine, Ser: 0.58 mg/dL — ABNORMAL LOW (ref 0.61–1.24)
GFR, Estimated: >60 mL/min (ref >60)
Glucose, Bld: 121 mg/dL — ABNORMAL HIGH (ref 70–99)
Potassium: 2.9 mmol/L — ABNORMAL LOW (ref 3.5–5.1)
Sodium: 136 mmol/L (ref 135–145)

## 2021-05-10 LAB — RAPID URINE DRUG SCREEN, HOSP PERFORMED
Amphetamines: NOT DETECTED
Barbiturates: NOT DETECTED
Benzodiazepines: NOT DETECTED
Cocaine: NOT DETECTED
Opiates: NOT DETECTED
Tetrahydrocannabinol: POSITIVE — AB

## 2021-05-10 LAB — C-REACTIVE PROTEIN: CRP: <0.5 mg/dL (ref <1.0)

## 2021-05-10 LAB — RESP PANEL BY RT-PCR (FLU A&B, COVID) ARPGX2
Influenza A by PCR: NEGATIVE
Influenza B by PCR: NEGATIVE
SARS Coronavirus 2 by RT PCR: NEGATIVE

## 2021-05-10 LAB — SEDIMENTATION RATE: Sed Rate: 8 mm/hr (ref 0–16)

## 2021-05-10 MED ORDER — ACETAMINOPHEN 325 MG PO TABS
650.0000 mg | ORAL_TABLET | Freq: Once | ORAL | Status: AC
  Administered 2021-05-10: 650 mg via ORAL
  Filled 2021-05-10: qty 2

## 2021-05-10 MED ORDER — DIPHENHYDRAMINE HCL 50 MG/ML IJ SOLN
25.0000 mg | Freq: Once | INTRAMUSCULAR | Status: AC
  Administered 2021-05-10: 25 mg via INTRAMUSCULAR
  Filled 2021-05-10: qty 1

## 2021-05-10 MED ORDER — KETOROLAC TROMETHAMINE 30 MG/ML IJ SOLN
30.0000 mg | Freq: Once | INTRAMUSCULAR | Status: AC
  Administered 2021-05-10: 30 mg via INTRAMUSCULAR
  Filled 2021-05-10: qty 1

## 2021-05-10 MED ORDER — METOCLOPRAMIDE HCL 5 MG/ML IJ SOLN
10.0000 mg | Freq: Once | INTRAMUSCULAR | Status: AC
  Administered 2021-05-10: 10 mg via INTRAMUSCULAR
  Filled 2021-05-10: qty 2

## 2021-05-10 MED ORDER — POTASSIUM CHLORIDE CRYS ER 20 MEQ PO TBCR
40.0000 meq | EXTENDED_RELEASE_TABLET | Freq: Once | ORAL | Status: AC
  Administered 2021-05-10: 40 meq via ORAL
  Filled 2021-05-10 (×2): qty 2

## 2021-05-10 NOTE — ED Provider Notes (Signed)
[facility] EMERGENCY DEPARTMENT Provider Note   CSN: [id number] Arrival date & time: 05/09/21  2222     History Chief Complaint  Patient presents with  . Sinusitis    [name] [name] is a 60 y.o. male.  HPI 60 year old male with history of alcohol abuse and drug abuse presents to the ED for headache. States it started gradually a week ago and has been constant and worsening. Nothing makes it better, nothing makes it worse. States he was evaluated for this about 5 days ago and started on antibiotics for possible sinus infection. States he has been taking the medicine, but it is not helping. Denies vision changes, weakness, numbness, nausea, vomiting, and fever. Does endorse occasional cough.  Is very sleepy this morning.  States that his headache is actually little bit better now.  Does endorse significant marijuana use, but denies recent alcohol use since last Monday.  Denies other drug abuse.    Past Medical History:  Diagnosis Date  . Alcohol abuse   . Drug abuse ([place])   . Right rotator cuff tear     Patient Active Problem List   Diagnosis Date Noted  . Substance induced mood disorder ([place]) 10/31/2016    Past Surgical History:  Procedure Laterality Date  . AMPUTATION Left 05/30/2019   Procedure: REVISION AMPUTATION OF INDEX FINGER;  Surgeon: Leanora Cover, MD;  Location: [place];  Service: Orthopedics;  Laterality: Left;  . SHOULDER ARTHROSCOPY WITH OPEN ROTATOR CUFF REPAIR AND DISTAL CLAVICLE ACROMINECTOMY Right 07/03/2020   Procedure: SHOULDER ARTHROSCOPY WITH OPEN ROTATOR CUFF REPAIR AND DISTAL CLAVICLE ARTHROSCOPIC DEBRIDEMENT;  Surgeon: Earlie Server, MD;  Location: [facility];  Service: Orthopedics;  Laterality: Right;  . SHOULDER CLOSED REDUCTION Right 10/28/2020   Procedure: CLOSED MANIPULATION SHOULDER;  Surgeon: Earlie Server, MD;  Location: [facility];  Service: Orthopedics;  Laterality: Right;  . toe amputaion Left  yrs ago       History reviewed. No pertinent family history.  Social History   Tobacco Use  . Smoking status: Current Every Day Smoker    Packs/day: 0.25    Years: 30.00    Pack years: 7.50    Types: Cigarettes  . Smokeless tobacco: Never Used  Vaping Use  . Vaping Use: Never used  Substance Use Topics  . Alcohol use: Yes    Alcohol/week: 3.0 standard drinks    Types: 3 Cans of beer per week    Comment: daily 3-5 beers per day, sometimes none  . Drug use: Yes    Types: Marijuana    Comment: former cocaine/crack last used yrs ago, marijuana last used 06-25-2020    Home Medications Prior to Admission medications   Medication Sig Start Date End Date Taking? Authorizing Provider  doxycycline (VIBRAMYCIN) 100 MG capsule Take 1 capsule (100 mg total) by mouth 2 (two) times daily. One po bid x 7 days 05/05/21   Ripley Fraise, MD    Allergies    Patient has no known allergies.  Review of Systems   Review of Systems  Constitutional: Negative for chills and fever.  HENT: Negative for ear pain and sore throat.   Eyes: Negative for pain and visual disturbance.  Respiratory: Positive for cough. Negative for shortness of breath.   Cardiovascular: Negative for chest pain and palpitations.  Gastrointestinal: Negative for abdominal pain and vomiting.  Genitourinary: Negative for dysuria and hematuria.  Musculoskeletal: Negative for arthralgias and back pain.  Skin: Negative for  color change and rash.  Neurological: Positive for headaches. Negative for seizures and syncope.  Psychiatric/Behavioral: The patient is not nervous/anxious.   All other systems reviewed and are negative.   Physical Exam Updated Vital Signs BP (!) 162/93 (BP Location: Right Arm)   Pulse (!) 59   Temp 98.6 F (37 C) (Oral)   Resp 16   SpO2 100%   Physical Exam Vitals and nursing note reviewed.  Constitutional:      Appearance: He is well-developed.     Comments: Sleepy, but awakable  HENT:      Head: Normocephalic and atraumatic.     Right Ear: External ear normal.     Left Ear: External ear normal.     Mouth/Throat:     Mouth: Mucous membranes are moist.  Eyes:     Extraocular Movements: Extraocular movements intact.     Conjunctiva/sclera: Conjunctivae normal.     Pupils: Pupils are equal, round, and reactive to light.  Cardiovascular:     Rate and Rhythm: Normal rate and regular rhythm.     Heart sounds: No murmur heard.   Pulmonary:     Effort: Pulmonary effort is normal. No respiratory distress.     Breath sounds: Normal breath sounds.  Abdominal:     General: Abdomen is flat.     Palpations: Abdomen is soft.     Tenderness: There is no abdominal tenderness. There is no guarding.  Musculoskeletal:        General: Normal range of motion.     Cervical back: Neck supple.     Right lower leg: No edema.     Left lower leg: No edema.  Skin:    General: Skin is warm and dry.     Capillary Refill: Capillary refill takes less than 2 seconds.  Neurological:     General: No focal deficit present.     Mental Status: He is oriented to person, place, and time.     Cranial Nerves: No cranial nerve deficit.     Sensory: No sensory deficit.     Motor: No weakness.  Psychiatric:        Mood and Affect: Mood normal.     ED Results / Procedures / Treatments   Labs (all labs ordered are listed, but only abnormal results are displayed) Labs Reviewed  CBC WITH DIFFERENTIAL/PLATELET - Abnormal; Notable for the following components:      Result Value   RBC 4.14 (*)    All other components within normal limits  BASIC METABOLIC PANEL - Abnormal; Notable for the following components:   Potassium 2.9 (*)    Glucose, Bld 121 (*)    Creatinine, Ser 0.58 (*)    All other components within normal limits  RAPID URINE DRUG SCREEN, HOSP PERFORMED - Abnormal; Notable for the following components:   Tetrahydrocannabinol POSITIVE (*)    All other components within normal limits  RESP  PANEL BY RT-PCR (FLU A&B, COVID) ARPGX2  SEDIMENTATION RATE  C-REACTIVE PROTEIN  ETHANOL    EKG None  Radiology CT Head Wo Contrast  Result Date: 05/10/2021 CLINICAL DATA:  60 year old male with headache, intracranial hemorrhage suspected. EXAM: CT HEAD WITHOUT CONTRAST TECHNIQUE: Contiguous axial images were obtained from the base of the skull through the vertex without intravenous contrast. COMPARISON:  05/05/2021 FINDINGS: Brain: No evidence of acute infarction, hemorrhage, hydrocephalus, extra-axial collection or mass lesion/mass effect. Vascular: No hyperdense vessel or unexpected calcification. Skull: Normal. Negative for fracture or focal lesion. Sinuses/Orbits: Improved  mucosal thickening about the left maxillary sinus. Mild residual mucosal thickening of the ethmoid sinuses bilaterally. The sphenoid, frontal, and mastoid air cells are well aerated. Other: None. IMPRESSION: No acute intracranial abnormality.  Improving sinusitis. Electronically Signed   By: Ruthann Cancer MD   On: 05/10/2021 08:00    Procedures Procedures   Medications Ordered in ED Medications  acetaminophen (TYLENOL) tablet 650 mg (650 mg Oral Given 05/10/21 0542)  ketorolac (TORADOL) 30 MG/ML injection 30 mg (30 mg Intramuscular Given 05/10/21 0628)  metoCLOPramide (REGLAN) injection 10 mg (10 mg Intramuscular Given 05/10/21 0629)  diphenhydrAMINE (BENADRYL) injection 25 mg (25 mg Intramuscular Given 05/10/21 0628)  potassium chloride SA (KLOR-CON) CR tablet 40 mEq (40 mEq Oral Given 05/10/21 1287)    ED Course  I have reviewed the triage vital signs and the nursing notes.  Pertinent labs & imaging results that were available during my care of the patient were reviewed by me and considered in my medical decision making (see chart for details).    MDM Rules/Calculators/A&P                          60 year old gentleman here with headache.  Vital signs are stable, not in acute distress.  On exam he is very  sleepy.  He thinks it is likely from marijuana use, but he also did get a shot of Reglan and Benadryl prior to my evaluation.  He does report that this significantly helped the pain.  He is able to wake up, but quickly falls back asleep.  Does not have any focal neurologic deficits, but is not very cooperative with the exam.  Abdomen soft and nontender.  Heart without acute findings.  Lungs are clear to auscultation bilaterally.  Patient consistent with sinus headache.  He is tender in the bilateral frontal region.  No temporal artery tenderness.  Reports being on antibiotics for sinusitis, and is already feeling better in the emergency department.  Low suspicion for aneurysm, intracranial hemorrhage, or stroke due to reassuring head CT a week ago and nonfocal neuro exam today.   Low suspicion for infectious or inflammatory cause of headache.  ESR and CRP are not elevated.  Unlikely meningitis or giant cell arteritis.  Due to sleepiness, will add ethanol and UDS.  We will also reevaluate to see if he metabolizes substances.  Do believe he would be stable for discharge and outpatient management if he shows improvement in mental status.  Ethanol is negative.  UDS with THC.  Upon reevaluation, patient had improvement in alertness.  Appears to have metabolized substances.  Is able to walk without assistance, steady gait.  He is stable for discharge.  Feels better.  He feels comfortable going home.  We discussed symptomatic management and return precautions.  I recommended he establish care with a PCP for close follow-up, number provided.  Patient discharged in stable condition.  Final Clinical Impression(s) / ED Diagnoses Final diagnoses:  Frontal headache    Rx / DC Orders ED Discharge Orders    None       Suzan Nailer, DO 05/10/21 1108    Charlesetta Shanks, MD 05/16/21 2137

## 2021-05-10 NOTE — Discharge Instructions (Signed)
We suggest you decrease the amount of marijuana that you use as this can be bad for your health. You can take ibuprofen and Tylenol for headaches. We recommend you see a doctor in the next 1 to 2 days for further follow-up.  Call the number above to schedule an appointment with a primary care doctor, you will need 1 going forward. Return for any nausea or vomiting that will not stop, weakness, numbness, chest pain, or shortness of breath.

## 2021-05-10 NOTE — ED Notes (Signed)
Pt ambulated to bathroom, no distress noted

## 2021-05-10 NOTE — ED Provider Notes (Signed)
Emergency Medicine Provider Triage Evaluation Note  .[name] [name] is a 60 y.o. male, No obstetric history on file., who presents to the emergency department with complaints of diffuse headache for the past 6 days associated with congestion, cough and runny nose.  Seen in the ED on May 10 with negative CT..  States headache gradual onset.  No photophobia, phonophobia, nausea, vomiting or fever.   Review of Systems  Positive: Headache Negative: Fever, vomiting, weakness, numbness  Physical Exam  BP (!) 146/92 (BP Location: Left Arm)   Pulse (!) 55   Resp 18   SpO2 99%  Gen:   Awake, no distress   Resp:  Normal effort  MSK:   Moves extremities without difficulty  Other:  Tearful, conjunctival injection  Medical Decision Making  Medically screening exam initiated at 6:19 AM.  Appropriate orders placed.  [name] [name] was informed that the remainder of the evaluation will be completed by another provider, this initial triage assessment does not replace that evaluation, and the importance of remaining in the ED until their evaluation is complete.  Progressively worsening headache with negative work-up on May 10.   Glynn Octave, MD 05/10/21 [phone]

## 2021-05-10 NOTE — ED Provider Notes (Incomplete)
I saw and evaluated the patient, reviewed the resident's note and I agree with the findings and plan.       Jassica Zazueta, MD 10/09/22 1140  

## 2021-05-19 ENCOUNTER — Inpatient Hospital Stay (HOSPITAL_COMMUNITY)
Admission: EM | Admit: 2021-05-19 | Discharge: 2021-07-05 | DRG: 020 | Disposition: A | Discharge status: Skilled Nursing Facility | Payer: 59 | Attending: Internal Medicine | Admitting: Internal Medicine

## 2021-05-19 ENCOUNTER — Emergency Department (HOSPITAL_COMMUNITY): Payer: 59

## 2021-05-19 DIAGNOSIS — G8194 Hemiplegia, unspecified affecting left nondominant side: Secondary | ICD-10-CM | POA: Diagnosis present

## 2021-05-19 DIAGNOSIS — R001 Bradycardia, unspecified: Secondary | ICD-10-CM | POA: Diagnosis not present

## 2021-05-19 DIAGNOSIS — E222 Syndrome of inappropriate secretion of antidiuretic hormone: Secondary | ICD-10-CM | POA: Diagnosis not present

## 2021-05-19 DIAGNOSIS — F17210 Nicotine dependence, cigarettes, uncomplicated: Secondary | ICD-10-CM | POA: Diagnosis present

## 2021-05-19 DIAGNOSIS — R1314 Dysphagia, pharyngoesophageal phase: Secondary | ICD-10-CM | POA: Diagnosis present

## 2021-05-19 DIAGNOSIS — I428 Other cardiomyopathies: Secondary | ICD-10-CM | POA: Diagnosis not present

## 2021-05-19 DIAGNOSIS — R5381 Other malaise: Secondary | ICD-10-CM | POA: Diagnosis not present

## 2021-05-19 DIAGNOSIS — I609 Nontraumatic subarachnoid hemorrhage, unspecified: Secondary | ICD-10-CM

## 2021-05-19 DIAGNOSIS — Z781 Physical restraint status: Secondary | ICD-10-CM

## 2021-05-19 DIAGNOSIS — I608 Other nontraumatic subarachnoid hemorrhage: Secondary | ICD-10-CM

## 2021-05-19 DIAGNOSIS — R4182 Altered mental status, unspecified: Secondary | ICD-10-CM | POA: Diagnosis present

## 2021-05-19 DIAGNOSIS — R502 Drug induced fever: Secondary | ICD-10-CM | POA: Diagnosis not present

## 2021-05-19 DIAGNOSIS — R4701 Aphasia: Secondary | ICD-10-CM

## 2021-05-19 DIAGNOSIS — I602 Nontraumatic subarachnoid hemorrhage from anterior communicating artery: Principal | ICD-10-CM | POA: Diagnosis present

## 2021-05-19 DIAGNOSIS — Z20822 Contact with and (suspected) exposure to covid-19: Secondary | ICD-10-CM | POA: Diagnosis present

## 2021-05-19 DIAGNOSIS — G9341 Metabolic encephalopathy: Secondary | ICD-10-CM | POA: Diagnosis not present

## 2021-05-19 DIAGNOSIS — G914 Hydrocephalus in diseases classified elsewhere: Secondary | ICD-10-CM | POA: Diagnosis present

## 2021-05-19 DIAGNOSIS — Z931 Gastrostomy status: Secondary | ICD-10-CM | POA: Diagnosis not present

## 2021-05-19 DIAGNOSIS — I67848 Other cerebrovascular vasospasm and vasoconstriction: Secondary | ICD-10-CM | POA: Diagnosis not present

## 2021-05-19 DIAGNOSIS — I10 Essential (primary) hypertension: Secondary | ICD-10-CM | POA: Diagnosis not present

## 2021-05-19 DIAGNOSIS — G910 Communicating hydrocephalus: Secondary | ICD-10-CM | POA: Diagnosis not present

## 2021-05-19 DIAGNOSIS — R4189 Other symptoms and signs involving cognitive functions and awareness: Secondary | ICD-10-CM | POA: Diagnosis present

## 2021-05-19 DIAGNOSIS — Z4659 Encounter for fitting and adjustment of other gastrointestinal appliance and device: Secondary | ICD-10-CM

## 2021-05-19 DIAGNOSIS — E871 Hypo-osmolality and hyponatremia: Secondary | ICD-10-CM

## 2021-05-19 DIAGNOSIS — Z982 Presence of cerebrospinal fluid drainage device: Secondary | ICD-10-CM

## 2021-05-19 DIAGNOSIS — R262 Difficulty in walking, not elsewhere classified: Secondary | ICD-10-CM | POA: Diagnosis not present

## 2021-05-19 DIAGNOSIS — R197 Diarrhea, unspecified: Secondary | ICD-10-CM | POA: Diagnosis not present

## 2021-05-19 DIAGNOSIS — I615 Nontraumatic intracerebral hemorrhage, intraventricular: Secondary | ICD-10-CM | POA: Diagnosis present

## 2021-05-19 DIAGNOSIS — E876 Hypokalemia: Secondary | ICD-10-CM | POA: Diagnosis not present

## 2021-05-19 HISTORY — DX: Other seasonal allergic rhinitis: J30.2

## 2021-05-19 HISTORY — PX: ANEURYSM COILING: SHX5349

## 2021-05-19 LAB — RESP PANEL BY RT-PCR (FLU A&B, COVID) ARPGX2
Influenza A by PCR: NEGATIVE
Influenza B by PCR: NEGATIVE
SARS Coronavirus 2 by RT PCR: NEGATIVE

## 2021-05-19 LAB — COMPREHENSIVE METABOLIC PANEL
ALT: 41 U/L (ref 0–44)
AST: 91 U/L — ABNORMAL HIGH (ref 15–41)
Albumin: 3.5 g/dL (ref 3.5–5.0)
Alkaline Phosphatase: 50 U/L (ref 38–126)
Anion gap: 11 (ref 5–15)
BUN: 18 mg/dL (ref 6–20)
CO2: 23 mmol/L (ref 22–32)
Calcium: 9.1 mg/dL (ref 8.9–10.3)
Chloride: 107 mmol/L (ref 98–111)
Creatinine, Ser: 0.68 mg/dL (ref 0.61–1.24)
GFR, Estimated: >60 mL/min (ref >60)
Glucose, Bld: 128 mg/dL — ABNORMAL HIGH (ref 70–99)
Potassium: 3.6 mmol/L (ref 3.5–5.1)
Sodium: 141 mmol/L (ref 135–145)
Total Bilirubin: 1.1 mg/dL (ref 0.3–1.2)
Total Protein: 6.8 g/dL (ref 6.5–8.1)

## 2021-05-19 LAB — CBC
HCT: 39.3 % (ref 39.0–52.0)
Hemoglobin: 13.7 g/dL (ref 13.0–17.0)
MCH: 33.5 pg (ref 26.0–34.0)
MCHC: 34.9 g/dL (ref 30.0–36.0)
MCV: 96.1 fL (ref 80.0–100.0)
Platelets: 332 10*3/uL (ref 150–400)
RBC: 4.09 MIL/uL — ABNORMAL LOW (ref 4.22–5.81)
RDW: 12.7 % (ref 11.5–15.5)
WBC: 19.1 10*3/uL — ABNORMAL HIGH (ref 4.0–10.5)
nRBC: 0 % (ref 0.0–0.2)

## 2021-05-19 LAB — CBC WITH DIFFERENTIAL/PLATELET
Abs Immature Granulocytes: 0.06 10*3/uL (ref 0.00–0.07)
Basophils Absolute: 0 10*3/uL (ref 0.0–0.1)
Basophils Relative: 0 %
Eosinophils Absolute: 0 10*3/uL (ref 0.0–0.5)
Eosinophils Relative: 0 %
HCT: 44.5 % (ref 39.0–52.0)
Hemoglobin: 14.9 g/dL (ref 13.0–17.0)
Immature Granulocytes: 0 %
Lymphocytes Relative: 6 %
Lymphs Abs: 0.9 10*3/uL (ref 0.7–4.0)
MCH: 32.9 pg (ref 26.0–34.0)
MCHC: 33.5 g/dL (ref 30.0–36.0)
MCV: 98.2 fL (ref 80.0–100.0)
Monocytes Absolute: 1.3 10*3/uL — ABNORMAL HIGH (ref 0.1–1.0)
Monocytes Relative: 8 %
Neutro Abs: 14.1 10*3/uL — ABNORMAL HIGH (ref 1.7–7.7)
Neutrophils Relative %: 86 %
Platelets: 335 10*3/uL (ref 150–400)
RBC: 4.53 MIL/uL (ref 4.22–5.81)
RDW: 12.9 % (ref 11.5–15.5)
WBC: 16.4 10*3/uL — ABNORMAL HIGH (ref 4.0–10.5)
nRBC: 0 % (ref 0.0–0.2)

## 2021-05-19 LAB — URINALYSIS, ROUTINE W REFLEX MICROSCOPIC
Bacteria, UA: NONE SEEN
Bilirubin Urine: NEGATIVE
Glucose, UA: NEGATIVE mg/dL
Ketones, ur: 20 mg/dL — AB
Leukocytes,Ua: NEGATIVE
Nitrite: NEGATIVE
Protein, ur: NEGATIVE mg/dL
Specific Gravity, Urine: 1.02 (ref 1.005–1.030)
pH: 6 (ref 5.0–8.0)

## 2021-05-19 LAB — RAPID URINE DRUG SCREEN, HOSP PERFORMED
Amphetamines: NOT DETECTED
Barbiturates: NOT DETECTED
Benzodiazepines: NOT DETECTED
Cocaine: NOT DETECTED
Opiates: NOT DETECTED
Tetrahydrocannabinol: POSITIVE — AB

## 2021-05-19 LAB — ETHANOL: Alcohol, Ethyl (B): <10 mg/dL (ref <10)

## 2021-05-19 LAB — SALICYLATE LEVEL: Salicylate Lvl: <7 mg/dL — ABNORMAL LOW (ref 7.0–30.0)

## 2021-05-19 LAB — CBG MONITORING, ED: Glucose-Capillary: 132 mg/dL — ABNORMAL HIGH (ref 70–99)

## 2021-05-19 LAB — HIV ANTIBODY (ROUTINE TESTING W REFLEX): HIV Screen 4th Generation wRfx: NONREACTIVE

## 2021-05-19 LAB — LACTIC ACID, PLASMA: Lactic Acid, Venous: 1.8 mmol/L (ref 0.5–1.9)

## 2021-05-19 LAB — APTT: aPTT: 26 seconds (ref 24–36)

## 2021-05-19 LAB — PROTIME-INR
INR: 1 (ref 0.8–1.2)
Prothrombin Time: 13.6 seconds (ref 11.4–15.2)

## 2021-05-19 LAB — MRSA PCR SCREENING: MRSA by PCR: NEGATIVE

## 2021-05-19 LAB — ACETAMINOPHEN LEVEL: Acetaminophen (Tylenol), Serum: <10 ug/mL — ABNORMAL LOW (ref 10–30)

## 2021-05-19 MED ORDER — NIMODIPINE 6 MG/ML PO SOLN
60.0000 mg | ORAL | Status: DC
  Administered 2021-05-21 – 2021-05-29 (×47): 60 mg
  Filled 2021-05-19 (×47): qty 10

## 2021-05-19 MED ORDER — ACETAMINOPHEN 325 MG PO TABS
650.0000 mg | ORAL_TABLET | ORAL | Status: DC | PRN
  Administered 2021-05-28 – 2021-07-05 (×2): 650 mg via ORAL
  Filled 2021-05-19 (×2): qty 2

## 2021-05-19 MED ORDER — SODIUM CHLORIDE 0.9 % IV SOLN: INTRAVENOUS | Status: DC

## 2021-05-19 MED ORDER — PANTOPRAZOLE SODIUM 40 MG PO TBEC
40.0000 mg | DELAYED_RELEASE_TABLET | Freq: Every day | ORAL | Status: DC
  Administered 2021-05-19: 40 mg via ORAL
  Filled 2021-05-19: qty 1

## 2021-05-19 MED ORDER — ACETAMINOPHEN 160 MG/5ML PO SOLN
650.0000 mg | ORAL | Status: DC | PRN
  Administered 2021-05-21 – 2021-07-05 (×25): 650 mg
  Filled 2021-05-19 (×27): qty 20.3

## 2021-05-19 MED ORDER — NIMODIPINE 30 MG PO CAPS
60.0000 mg | ORAL_CAPSULE | ORAL | Status: DC
  Administered 2021-05-19 – 2021-05-20 (×3): 60 mg via ORAL
  Filled 2021-05-19 (×3): qty 2

## 2021-05-19 MED ORDER — DOCUSATE SODIUM 100 MG PO CAPS: 100.0000 mg | ORAL_CAPSULE | Freq: Two times a day (BID) | ORAL | Status: DC

## 2021-05-19 MED ORDER — ONDANSETRON 4 MG PO TBDP: 4.0000 mg | ORAL_TABLET | Freq: Four times a day (QID) | ORAL | Status: DC | PRN

## 2021-05-19 MED ORDER — ONDANSETRON HCL 4 MG/2ML IJ SOLN: 4.0000 mg | Freq: Four times a day (QID) | INTRAMUSCULAR | Status: DC | PRN

## 2021-05-19 MED ORDER — ACETAMINOPHEN 650 MG RE SUPP: 650.0000 mg | RECTAL | Status: DC | PRN

## 2021-05-19 MED ORDER — HYDROMORPHONE HCL 1 MG/ML IJ SOLN
0.5000 mg | INTRAMUSCULAR | Status: DC | PRN
  Administered 2021-05-30 (×2): 0.5 mg via INTRAVENOUS
  Filled 2021-05-19 (×3): qty 1

## 2021-05-19 MED ORDER — IOHEXOL 350 MG/ML SOLN
50.0000 mL | Freq: Once | INTRAVENOUS | Status: AC | PRN
  Administered 2021-05-19: 50 mL via INTRAVENOUS

## 2021-05-19 MED ORDER — LEVETIRACETAM IN NACL 500 MG/100ML IV SOLN
500.0000 mg | Freq: Two times a day (BID) | INTRAVENOUS | Status: DC
  Administered 2021-05-19 – 2021-05-23 (×8): 500 mg via INTRAVENOUS
  Filled 2021-05-19 (×8): qty 100

## 2021-05-19 MED ORDER — LABETALOL HCL 5 MG/ML IV SOLN: 20.0000 mg | Freq: Once | INTRAVENOUS | Status: DC

## 2021-05-19 MED ORDER — CEFAZOLIN SODIUM-DEXTROSE 2-4 GM/100ML-% IV SOLN
2.0000 g | INTRAVENOUS | Status: AC
  Administered 2021-05-19: 2 g via INTRAVENOUS
  Filled 2021-05-19: qty 100

## 2021-05-19 MED ORDER — PANTOPRAZOLE SODIUM 40 MG PO PACK
40.0000 mg | PACK | Freq: Every day | ORAL | Status: DC
  Administered 2021-05-21 – 2021-06-10 (×20): 40 mg
  Filled 2021-05-19 (×21): qty 20

## 2021-05-19 MED ORDER — NICARDIPINE HCL IN NACL 20-0.86 MG/200ML-% IV SOLN
0.0000 mg/h | INTRAVENOUS | Status: DC
  Administered 2021-05-19: 5 mg/h via INTRAVENOUS
  Filled 2021-05-19: qty 200

## 2021-05-19 NOTE — Progress Notes (Signed)
Pharmacy Antibiotic Note  [name] [name] is a 60 y.o. male admitted on 05/19/2021 with surgical prophylaxis.  Pharmacy has been consulted for cefazolin dosing.  Plan: -Cefazolin 2 gm IV once in surgery.  -F/u post op abx orders after surgery    Temp (24hrs), Avg:99 F (37.2 C), Min:99 F (37.2 C), Max:99 F (37.2 C)  Recent Labs  Lab 05/19/21 1753  WBC 16.4*  CREATININE 0.68  LATICACIDVEN 1.8    CrCl cannot be calculated (Unknown ideal weight.).    No Known Allergies   Thank you for allowing pharmacy to be a part of this patient's care.  Vinnie Level, PharmD., BCPS, BCCCP Clinical Pharmacist Please refer to [facility] for unit-specific pharmacist

## 2021-05-19 NOTE — Consult Note (Signed)
NAME:  DEMARRI ELIE, MRN:  [id number], DOB:  1961/01/06, LOS: 0 ADMISSION DATE:  05/19/2021, CONSULTATION DATE:  05/19/2021 REFERRING MD:  Dr Conchita Paris, CHIEF COMPLAINT:  SAH  History of Present Illness:  This is a 60 y/o male that presented to the ER via EMS from home.  The pt was last seen by his family on Monday night and did not have physical complaints at that time.  The pt did not show up for work and the family was notified because he is always there.  The police assisted with gaining access to the house and found the pt minimally responsive on the floor.  While in the ER, the pt was found to have a SAH.  The pts mother is not aware of any recent trauma.  The pt was seen in the ER on 05/15 with a frontal headache and had a negative  Head ct at that time.    Pertinent  Medical History  Sinusitis Marijuana use Remote hx of crack cocaine use    Significant Hospital Events: Including procedures, antibiotic start and stop dates in addition to other pertinent events   .     Objective   Blood pressure 121/69, pulse 61, temperature 99 F (37.2 C), temperature source Rectal, resp. rate 16, SpO2 100 %.       No intake or output data in the 24 hours ending 05/19/21 2100 There were no vitals filed for this visit.  Examination: General:No acute distress HENT: EVD intact. Lungs: clear  Cardiovascular: RR no murmur Abdomen: Soft nondistended +BS No rebound/rigidity/ guarding but limited by neuro exam. Extremities: No edema,  Distal pulses intact x4  No cyanosis Neuro: Unconscious  Not following commands.  + gag and cough   Pupil are equal.    Labs/imaging that I havepersonally reviewed  (right click and "Reselect all SmartList Selections" daily)     Assessment & Plan:  Acute SAH 4mm anterior communicating artery aneursym S/P EVD placement HTN controlled ETOH and Marijuana abuse  Plan: Admit to the ICU  Serial neuro checks BP is currently well controlled therefore with  continue to closely monitor Monitor I/O Replete K per protocol AED: keppra  Updated the pts mother at the bedside. Answered all questions  Best practice (right click and "Reselect all SmartList Selections" daily)  Diet:  NPO Pain/Anxiety/Delirium protocol (if indicated): No VAP protocol (if indicated): Not indicated DVT prophylaxis: SCD GI prophylaxis: PPI Glucose control:  SSI No Central venous access:  N/A Arterial line:  N/A Foley:  N/A Mobility:  bed rest  PT consulted: N/A Last date of multidisciplinary goals of care discussion [ Code Status:  full code Disposition: admit to ICU  Labs   CBC: Recent Labs  Lab 05/19/21 1753  WBC 16.4*  NEUTROABS 14.1*  HGB 14.9  HCT 44.5  MCV 98.2  PLT 335    Basic Metabolic Panel: Recent Labs  Lab 05/19/21 1753  NA 141  K 3.6  CL 107  CO2 23  GLUCOSE 128*  BUN 18  CREATININE 0.68  CALCIUM 9.1   GFR: CrCl cannot be calculated (Unknown ideal weight.). Recent Labs  Lab 05/19/21 1753  WBC 16.4*  LATICACIDVEN 1.8    Liver Function Tests: Recent Labs  Lab 05/19/21 1753  AST 91*  ALT 41  ALKPHOS 50  BILITOT 1.1  PROT 6.8  ALBUMIN 3.5   No results for input(s): LIPASE, AMYLASE in the last 168 hours. No results for input(s): AMMONIA in the last 168  hours.  ABG    Component Value Date/Time   HCO3 25.1 (H) 01/24/2008 2351   TCO2 26 01/24/2008 2351   ACIDBASEDEF 1.0 01/24/2008 2351     Coagulation Profile: No results for input(s): INR, PROTIME in the last 168 hours.  Cardiac Enzymes: No results for input(s): CKTOTAL, CKMB, CKMBINDEX, TROPONINI in the last 168 hours.  HbA1C: Hemoglobin A1C  Date/Time Value Ref Range Status  12/14/2013 05:30 AM 5.6 4.2 - 6.3 % Final    Comment:    The American Diabetes Association recommends that a primary goal of therapy should be <7% and that physicians should reevaluate the treatment regimen in patients with HbA1c values consistently >8%.     CBG: Recent Labs   Lab 05/19/21 1752  GLUCAP 132*    Review of Systems:   Pt is unable to provide due to neuro  Past Medical History:  He,  has a past medical history of Alcohol abuse, Drug abuse (HCC), and Right rotator cuff tear.   Surgical History:   Past Surgical History:  Procedure Laterality Date  . AMPUTATION Left 05/30/2019   Procedure: REVISION AMPUTATION OF INDEX FINGER;  Surgeon: Betha Loa, MD;  Location: [facility] OR;  Service: Orthopedics;  Laterality: Left;  . SHOULDER ARTHROSCOPY WITH OPEN ROTATOR CUFF REPAIR AND DISTAL CLAVICLE ACROMINECTOMY Right 07/03/2020   Procedure: SHOULDER ARTHROSCOPY WITH OPEN ROTATOR CUFF REPAIR AND DISTAL CLAVICLE ARTHROSCOPIC DEBRIDEMENT;  Surgeon: Frederico Hamman, MD;  Location: [facility];  Service: Orthopedics;  Laterality: Right;  . SHOULDER CLOSED REDUCTION Right 10/28/2020   Procedure: CLOSED MANIPULATION SHOULDER;  Surgeon: Frederico Hamman, MD;  Location: [facility];  Service: Orthopedics;  Laterality: Right;  . toe amputaion Left yrs ago     Social History:   reports that he has been smoking cigarettes. He has a 7.50 pack-year smoking history. He has never used smokeless tobacco. He reports current alcohol use of about 3.0 standard drinks of alcohol per week. He reports current drug use. Drug: Marijuana.   Family History:  His family history is not on file.   Allergies No Known Allergies   Home Medications  Prior to Admission medications   Medication Sig Start Date End Date Taking? Authorizing Provider  etodolac (LODINE) 500 MG tablet Take 500 mg by mouth 2 (two) times daily.   Yes [provider]  doxycycline (VIBRAMYCIN) 100 MG capsule Take 1 capsule (100 mg total) by mouth 2 (two) times daily. One po bid x 7 days 05/05/21   Zadie Rhine, MD     Critical care time: 

## 2021-05-19 NOTE — ED Triage Notes (Signed)
Pt bib ems from home altered mental status. Pt found naked and in the floor beside the couch. Pt last talked to patient on Monday. HR in the 40's. Prolonged QT with EMS. 20g LFA. 140/70. RR 18, 100% RA.

## 2021-05-19 NOTE — ED Provider Notes (Signed)
[facility] EMERGENCY DEPARTMENT Provider Note   CSN: [id number] Arrival date & time: 05/19/21  1736     History Chief Complaint  Patient presents with  . Altered Mental Status    [name] [name] is a 60 y.o. male.  60 year old male who presents with altered mental status.  Patient last seen normal 24 hours prior.  Does have remote history of alcohol use.  EMS arrival, patient was found on the floor.  Heart rate in the 40s.  Patient transported here        Past Medical History:  Diagnosis Date  . Alcohol abuse   . Drug abuse (HCC)   . Right rotator cuff tear     Patient Active Problem List   Diagnosis Date Noted  . Substance induced mood disorder (HCC) 10/31/2016    Past Surgical History:  Procedure Laterality Date  . AMPUTATION Left 05/30/2019   Procedure: REVISION AMPUTATION OF INDEX FINGER;  Surgeon: Betha Loa, MD;  Location: [facility];  Service: Orthopedics;  Laterality: Left;  . SHOULDER ARTHROSCOPY WITH OPEN ROTATOR CUFF REPAIR AND DISTAL CLAVICLE ACROMINECTOMY Right 07/03/2020   Procedure: SHOULDER ARTHROSCOPY WITH OPEN ROTATOR CUFF REPAIR AND DISTAL CLAVICLE ARTHROSCOPIC DEBRIDEMENT;  Surgeon: Frederico Hamman, MD;  Location: [facility];  Service: Orthopedics;  Laterality: Right;  . SHOULDER CLOSED REDUCTION Right 10/28/2020   Procedure: CLOSED MANIPULATION SHOULDER;  Surgeon: Frederico Hamman, MD;  Location: [facility];  Service: Orthopedics;  Laterality: Right;  . toe amputaion Left yrs ago       No family history on file.  Social History   Tobacco Use  . Smoking status: Current Every Day Smoker    Packs/day: 0.25    Years: 30.00    Pack years: 7.50    Types: Cigarettes  . Smokeless tobacco: Never Used  Vaping Use  . Vaping Use: Never used  Substance Use Topics  . Alcohol use: Yes    Alcohol/week: 3.0 standard drinks    Types: 3 Cans of beer per week    Comment: daily 3-5 beers per day, sometimes none   . Drug use: Yes    Types: Marijuana    Comment: former cocaine/crack last used yrs ago, marijuana last used 06-25-2020    Home Medications Prior to Admission medications   Medication Sig Start Date End Date Taking? Authorizing Provider  doxycycline (VIBRAMYCIN) 100 MG capsule Take 1 capsule (100 mg total) by mouth 2 (two) times daily. One po bid x 7 days 05/05/21   Zadie Rhine, MD    Allergies    Patient has no known allergies.  Review of Systems   Review of Systems  Unable to perform ROS: Acuity of condition    Physical Exam Updated Vital Signs BP 134/87 (BP Location: Left Arm)   Pulse 61   Temp 99 F (37.2 C) (Rectal)   Resp 14   SpO2 100%   Physical Exam Vitals and nursing note reviewed.  Constitutional:      General: He is not in acute distress.    Appearance: Normal appearance. He is well-developed. He is not toxic-appearing.  HENT:     Head: Normocephalic and atraumatic.  Eyes:     General: Lids are normal.     Conjunctiva/sclera: Conjunctivae normal.     Pupils: Pupils are equal, round, and reactive to light.  Neck:     Thyroid: No thyroid mass.     Trachea: No tracheal deviation.  Cardiovascular:  Rate and Rhythm: Normal rate and regular rhythm.     Heart sounds: Normal heart sounds. No murmur heard. No gallop.   Pulmonary:     Effort: Pulmonary effort is normal. No respiratory distress.     Breath sounds: Normal breath sounds. No stridor. No decreased breath sounds, wheezing, rhonchi or rales.  Abdominal:     General: Bowel sounds are normal. There is no distension.     Palpations: Abdomen is soft.     Tenderness: There is no abdominal tenderness. There is no rebound.  Musculoskeletal:        General: No tenderness. Normal range of motion.     Cervical back: Normal range of motion and neck supple.  Skin:    General: Skin is warm and dry.     Findings: No abrasion or rash.  Neurological:     Mental Status: He is lethargic and disoriented.      GCS: GCS eye subscore is 4. GCS verbal subscore is 4. GCS motor subscore is 5.     Cranial Nerves: No cranial nerve deficit.     Motor: No tremor.     Comments: Withdraws to pain in all 4 extremities  Psychiatric:        Attention and Perception: He is inattentive.     ED Results / Procedures / Treatments   Labs (all labs ordered are listed, but only abnormal results are displayed) Labs Reviewed  RAPID URINE DRUG SCREEN, HOSP PERFORMED - Abnormal; Notable for the following components:      Result Value   Tetrahydrocannabinol POSITIVE (*)    All other components within normal limits  SALICYLATE LEVEL - Abnormal; Notable for the following components:   Salicylate Lvl <7.0 (*)    All other components within normal limits  ACETAMINOPHEN LEVEL - Abnormal; Notable for the following components:   Acetaminophen (Tylenol), Serum <10 (*)    All other components within normal limits  CBC WITH DIFFERENTIAL/PLATELET - Abnormal; Notable for the following components:   WBC 16.4 (*)    Neutro Abs 14.1 (*)    Monocytes Absolute 1.3 (*)    All other components within normal limits  COMPREHENSIVE METABOLIC PANEL - Abnormal; Notable for the following components:   Glucose, Bld 128 (*)    AST 91 (*)    All other components within normal limits  URINALYSIS, ROUTINE W REFLEX MICROSCOPIC - Abnormal; Notable for the following components:   Hgb urine dipstick SMALL (*)    Ketones, ur 20 (*)    All other components within normal limits  CBG MONITORING, ED - Abnormal; Notable for the following components:   Glucose-Capillary 132 (*)    All other components within normal limits  RESP PANEL BY RT-PCR (FLU A&B, COVID) ARPGX2  URINE CULTURE  ETHANOL  LACTIC ACID, PLASMA    EKG EKG Interpretation  Date/Time:  Wednesday May 19 2021 17:41:11 EDT Ventricular Rate:  46 PR Interval:  153 QRS Duration: 107 QT Interval:  478 QTC Calculation: 419 R Axis:   22 Text Interpretation: Sinus bradycardia  Left ventricular hypertrophy Abnormal T, consider ischemia, inferior leads Confirmed by Lorre Nick ([id number]) on 05/19/2021 6:21:41 PM   Radiology CT Head Wo Contrast  Result Date: 05/19/2021 CLINICAL DATA:  Cerebral hemorrhage suspected. Additional history provided: Altered mental status, patient found down. EXAM: CT HEAD WITHOUT CONTRAST TECHNIQUE: Contiguous axial images were obtained from the base of the skull through the vertex without intravenous contrast. COMPARISON:  Head CT examinations 05/10/2021 at FINDINGS:  Brain: Moderate volume acute hemorrhage is present within the ventricles, most notably within the right lateral and third ventricles. Small volume dependent hemorrhage is also present within the occipital horn of the left lateral ventricle. Prominence of the lateral and third ventricles compatible with moderate hydrocephalus. Overall moderate to large volume acute subarachnoid hemorrhage tracking along the anterior and superior aspect of the corpus callosum, within the septum pellucidum, as well as within the anterior interhemispheric fissure, left greater than right MCA cisterns and suprasellar cistern. Additional trace scattered acute subarachnoid hemorrhage along the bilateral cerebral hemispheres. Additionally, there is acute parenchymal hemorrhage within the anterior left frontal lobe measuring 2.8 x 1.1 cm. Mild surrounding edema within the anterior left frontal lobe, and also likely within the left callosal body/genu. Trace acute hemorrhage is also present along the falx. No evidence of intracranial mass. No midline shift. Vascular: No hyperdense vessel.  Atherosclerotic calcifications. Skull: Normal. Negative for fracture or focal lesion. Sinuses/Orbits: Visualized orbits show no acute finding. Trace bilateral ethmoid sinus mucosal thickening. These results were called by telephone at the time of interpretation on 05/19/2021 at 7:05 pm to provider Lorre Nick , who verbally acknowledged  these results. IMPRESSION: 1. Moderate volume acute intraventricular hemorrhage, as described. Associated prominence of the lateral and third ventricles compatible with moderate hydrocephalus. 2. Overall moderate to large acute subarachnoid hemorrhage, as described and in a distribution suspicious for aneurysm rupture. CTA head/neck recommended for further evaluation. 3. 2.8 x 1.1 cm acute parenchymal hemorrhage within the anterior left frontal lobe. Mild surrounding edema within the anterior left frontal lobe, and also likely within the left callosal body/genu. 4. Additional trace acute hemorrhage along the falx. Electronically Signed   By: Jackey Loge DO   On: 05/19/2021 19:07   CT Cervical Spine Wo Contrast  Result Date: 05/19/2021 CLINICAL DATA:  Found down/unresponsive. EXAM: CT CERVICAL SPINE WITHOUT CONTRAST TECHNIQUE: Multidetector CT imaging of the cervical spine was performed without intravenous contrast. Multiplanar CT image reconstructions were also generated. COMPARISON:  MRI of the cervical spine of 06/19/2020. Today's head CT is dictated separately (by neuroradiology). FINDINGS: Alignment: Spinal visualization through the top of T3. Straightening of expected cervical lordosis. Maintenance of vertebral body height. Mild anterolisthesis of C4 on C5 is similar to the prior MRI, given differences in technique. Skull base and vertebrae: Skull base intact. Coronal reformats demonstrate a normal C1-C2 articulation. Facets are well-aligned. Right-sided C3-4 facets are partially fused. Advanced spondylosis at C4 through C7 with endplate osteophytes, loss of intervertebral disc height. Soft tissues and spinal canal: Prevertebral soft tissues are within normal limits. Disc levels: Neural foraminal narrowing bilaterally at C3-4 and C4-5 secondary to uncovertebral joint hypertrophy. Right neural foraminal and central canal stenosis at C5-6 secondary to disc osteophyte complex and uncovertebral joint  hypertrophy. Bilateral neural foraminal and central canal narrowing at C6-7 secondary to above factors. Upper chest: No apical pneumothorax. Other: None. IMPRESSION: Advanced cervical spondylosis, as detailed above. No acute superimposed process. Nonspecific straightening of expected cervical lordosis. Electronically Signed   By: Jeronimo Greaves M.D.   On: 05/19/2021 18:42    Procedures Procedures   Medications Ordered in ED Medications  0.9 %  sodium chloride infusion ( Intravenous New Bag/Given 05/19/21 1911)  iohexol (OMNIPAQUE) 350 MG/ML injection 50 mL (50 mLs Intravenous Contrast Given 05/19/21 1926)    ED Course  I have reviewed the triage vital signs and the nursing notes.  Pertinent labs & imaging results that were available during my care of the  patient were reviewed by me and considered in my medical decision making (see chart for details).    MDM Rules/Calculators/A&P                          CT scan showed evidence of likely aneurysmal bleed with hydrocephalus as well as intraparenchymal hemorrhage.  Consultation to Dr. Wynetta Emery from neurosurgery obtained.  Patient's blood pressure remained stable.  Dr. Wynetta Emery placed an intracranial monitoring device.  Patient will go to the ICU and he will admit  CRITICAL CARE Performed by: Toy Baker Total critical care time: 55 minutes Critical care time was exclusive of separately billable procedures and treating other patients. Critical care was necessary to treat or prevent imminent or life-threatening deterioration. Critical care was time spent personally by me on the following activities: development of treatment plan with patient and/or surrogate as well as nursing, discussions with consultants, evaluation of patient's response to treatment, examination of patient, obtaining history from patient or surrogate, ordering and performing treatments and interventions, ordering and review of laboratory studies, ordering and review of radiographic  studies, pulse oximetry and re-evaluation of patient's condition.  Final Clinical Impression(s) / ED Diagnoses Final diagnoses:  None    Rx / DC Orders ED Discharge Orders    None       Lorre Nick, MD 05/19/21 1956

## 2021-05-19 NOTE — Procedures (Signed)
Risks and benefits were discussed with the patient and family at bedside. Consent obtained. Initial insertion site was marked midpupilary line just behind the hairline on the right. Patient was prepped and draped in sterile fashion. Vital signs stable and patient resting comfortably. 109ml of Lidocaine was used for local anesthetic. Incision with a scalpel was made at the insertion point that was already determined. Hand drill was then used to make a small craniotomy. Dura was felt and then punctured with a needle. Catheter was inserted and advanced until CSF return was seen. Advanced the catheter to 4mm. CSF flow still present. Catheter was then tunneled through a separate insertion site and sutured down securely with nylon suture. Initial incision was suture closed with nylon as well. CSF flow through catheter still patent. Catheter was connected to external drainage system and placed at 56mm of H2O. Sterile dressing applied. Patient tolerated the procedure well and vital signs were stable throughout. Patient withdrawing to pain.

## 2021-05-19 NOTE — ED Notes (Signed)
Jama Flavors ( Mother) [phone].

## 2021-05-19 NOTE — ED Notes (Signed)
Patient transported to CT scan . 

## 2021-05-19 NOTE — ED Notes (Addendum)
Dr. Wynetta Emery at bedside performing ventriculostomy.Consent signed by mother for patient .

## 2021-05-19 NOTE — H&P (Addendum)
[name] [name] is an 60 y.o. male.   HPI:  60 year old male presented to the ED with AMS. Wife at bedside. Patient very lethargic upon assessment but moving purposefully. We were called in regards to his CT head which showed moderate IVH with a large SAH that was suspicious of aneurysm. CTA was ordered.Dr. Wynetta Emery and I were at the bedside within 10 minutes.    Past Medical History:  Diagnosis Date  . Alcohol abuse   . Drug abuse (HCC)   . Right rotator cuff tear     Past Surgical History:  Procedure Laterality Date  . AMPUTATION Left 05/30/2019   Procedure: REVISION AMPUTATION OF INDEX FINGER;  Surgeon: Betha Loa, MD;  Location: [facility] OR;  Service: Orthopedics;  Laterality: Left;  . SHOULDER ARTHROSCOPY WITH OPEN ROTATOR CUFF REPAIR AND DISTAL CLAVICLE ACROMINECTOMY Right 07/03/2020   Procedure: SHOULDER ARTHROSCOPY WITH OPEN ROTATOR CUFF REPAIR AND DISTAL CLAVICLE ARTHROSCOPIC DEBRIDEMENT;  Surgeon: Frederico Hamman, MD;  Location: [facility];  Service: Orthopedics;  Laterality: Right;  . SHOULDER CLOSED REDUCTION Right 10/28/2020   Procedure: CLOSED MANIPULATION SHOULDER;  Surgeon: Frederico Hamman, MD;  Location: [facility];  Service: Orthopedics;  Laterality: Right;  . toe amputaion Left yrs ago    No Known Allergies  Social History   Tobacco Use  . Smoking status: Current Every Day Smoker    Packs/day: 0.25    Years: 30.00    Pack years: 7.50    Types: Cigarettes  . Smokeless tobacco: Never Used  Substance Use Topics  . Alcohol use: Yes    Alcohol/week: 3.0 standard drinks    Types: 3 Cans of beer per week    Comment: daily 3-5 beers per day, sometimes none    No family history on file.   Review of Systems  Positive ROS: lethargic  All other systems have been reviewed and were otherwise negative with the exception of those mentioned in the HPI and as above.  Objective: Vital signs in last 24 hours: Temp:  [99 F (37.2 C)] 99 F (37.2  C) (05/25 1740) Pulse Rate:  [45-61] 61 (05/25 1937) Resp:  [13-18] 14 (05/25 1937) BP: (134-143)/(68-87) 134/87 (05/25 1937) SpO2:  [100 %] 100 % (05/25 1937)  General Appearance: lethargic, moving spontaneously  Head: Normocephalic, without obvious abnormality, atraumatic Eyes: PERRL, conjunctiva/corneas clear,fundi benign, both eyes      Lungs:  respirations unlabored Heart: bradycardic and regular rhythm Extremities: Extremities normal, atraumatic, no cyanosis or edema Pulses: 2+ and symmetric all extremities Skin: Skin color, texture, turgor normal, no rashes or lesions  NEUROLOGIC:   Mental status: lethargic and unable to follow commands  Motor Exam - localizes to pain Sensory Exam - not tested Reflexes: symmetric, no pathologic reflexes, No Hoffman's, No clonus Coordination -uta Gait -uta Balance -uta Cranial Nerves: I: smell Not tested  II: visual acuity  OS: na    OD: na  II: visual fields uta  II: pupils Equal, round, reactive to light  III,VII: ptosis None  III,IV,VI: extraocular muscles  uta  V: mastication UTA  V: facial light touch sensation  UTA  V,VII: corneal reflex  Present  VII: facial muscle function - upper    VII: facial muscle function - lower   VIII: hearing   IX: soft palate elevation    IX,X: gag reflex Present  XI: trapezius strength    XI: sternocleidomastoid strength   XI: neck flexion strength    XII: tongue  strength      Data Review Lab Results  Component Value Date   WBC 16.4 (H) 05/19/2021   HGB 14.9 05/19/2021   HCT 44.5 05/19/2021   MCV 98.2 05/19/2021   PLT 335 05/19/2021   Lab Results  Component Value Date   NA 141 05/19/2021   K 3.6 05/19/2021   CL 107 05/19/2021   CO2 23 05/19/2021   BUN 18 05/19/2021   CREATININE 0.68 05/19/2021   GLUCOSE 128 (H) 05/19/2021   No results found for: INR, PROTIME  Radiology: CT Angio Head W or Wo Contrast  Result Date: 05/19/2021 CLINICAL DATA:  Patient found down.  Intracranial  hemorrhage. EXAM: CT ANGIOGRAPHY HEAD AND NECK TECHNIQUE: Multidetector CT imaging of the head and neck was performed using the standard protocol during bolus administration of intravenous contrast. Multiplanar CT image reconstructions and MIPs were obtained to evaluate the vascular anatomy. Carotid stenosis measurements (when applicable) are obtained utilizing NASCET criteria, using the distal internal carotid diameter as the denominator. CONTRAST:  71mL OMNIPAQUE IOHEXOL 350 MG/ML SOLN COMPARISON:  Noncontrast head CT performed earlier today 05/19/2021. FINDINGS: CTA NECK FINDINGS Aortic arch: Standard aortic branching. The visualized aortic arch is normal in caliber. No hemodynamically significant innominate or proximal subclavian artery stenosis. Right carotid system: CCA and ICA patent within the neck without stenosis. No significant atherosclerotic disease. Left carotid system: CCA and ICA patent within the neck. Minimal soft and calcified plaque within the proximal left ICA results in less than 50% stenosis. Vertebral arteries: Codominant and patent within the neck without stenosis. Skeleton: Cervic advanced cervical spondylosis. Please refer to same day noncontrast CT of the cervical spine for a complete description of cervical spine findings. Other neck: No neck mass or cervical lymphadenopathy. Thyroid unremarkable. Upper chest: No consolidation within the imaged lung apices. Review of the MIP images confirms the above findings CTA HEAD FINDINGS Evaluation is somewhat limited due to suboptimal arterial contrast enhancement. Anterior circulation: The intracranial internal carotid arteries are patent. Mild nonstenotic calcified plaque within the left cavernous and paraclinoid segments. The M1 middle cerebral arteries are patent. No M2 proximal branch occlusion or high-grade proximal M2 stenosis is identified. The anterior cerebral arteries are patent. The anterior cerebral arteries are patent. 3 mm aneurysm  arising from the anterior communicating artery complex (series 7, image 118) (series 8, image 102). Posterior circulation: The intracranial vertebral arteries are patent. The basilar artery is patent. The posterior cerebral arteries are patent. Posterior communicating arteries are hypoplastic or absent bilaterally. Venous sinuses: Within the limitations of contrast timing, no convincing thrombus. Anatomic variants: As described Review of the MIP images confirms the above findings These results were called by telephone at the time of interpretation on 05/19/2021 at 7:58 pm to provider Lorre Nick , who verbally acknowledged these results. IMPRESSION: CTA neck: The common carotid, internal carotid and vertebral arteries are patent within the neck without hemodynamically significant stenosis. Minimal soft and calcified plaque within the proximal left ICA. CTA head: 1. Somewhat limited examination due to suboptimal arterial enhancement. 2. 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 3. No intracranial large vessel occlusion or proximal high-grade arterial stenosis. Electronically Signed   By: Jackey Loge DO   On: 05/19/2021 19:59   CT Head Wo Contrast  Result Date: 05/19/2021 CLINICAL DATA:  Cerebral hemorrhage suspected. Additional history provided: Altered mental status, patient found down. EXAM: CT HEAD WITHOUT CONTRAST TECHNIQUE: Contiguous  axial images were obtained from the base of the skull through the vertex without intravenous contrast. COMPARISON:  Head CT examinations 05/10/2021 at FINDINGS: Brain: Moderate volume acute hemorrhage is present within the ventricles, most notably within the right lateral and third ventricles. Small volume dependent hemorrhage is also present within the occipital horn of the left lateral ventricle. Prominence of the lateral and third ventricles compatible with moderate  hydrocephalus. Overall moderate to large volume acute subarachnoid hemorrhage tracking along the anterior and superior aspect of the corpus callosum, within the septum pellucidum, as well as within the anterior interhemispheric fissure, left greater than right MCA cisterns and suprasellar cistern. Additional trace scattered acute subarachnoid hemorrhage along the bilateral cerebral hemispheres. Additionally, there is acute parenchymal hemorrhage within the anterior left frontal lobe measuring 2.8 x 1.1 cm. Mild surrounding edema within the anterior left frontal lobe, and also likely within the left callosal body/genu. Trace acute hemorrhage is also present along the falx. No evidence of intracranial mass. No midline shift. Vascular: No hyperdense vessel.  Atherosclerotic calcifications. Skull: Normal. Negative for fracture or focal lesion. Sinuses/Orbits: Visualized orbits show no acute finding. Trace bilateral ethmoid sinus mucosal thickening. These results were called by telephone at the time of interpretation on 05/19/2021 at 7:05 pm to provider Lorre Nick , who verbally acknowledged these results. IMPRESSION: 1. Moderate volume acute intraventricular hemorrhage, as described. Associated prominence of the lateral and third ventricles compatible with moderate hydrocephalus. 2. Overall moderate to large acute subarachnoid hemorrhage, as described and in a distribution suspicious for aneurysm rupture. CTA head/neck recommended for further evaluation. 3. 2.8 x 1.1 cm acute parenchymal hemorrhage within the anterior left frontal lobe. Mild surrounding edema within the anterior left frontal lobe, and also likely within the left callosal body/genu. 4. Additional trace acute hemorrhage along the falx. Electronically Signed   By: Jackey Loge DO   On: 05/19/2021 19:07   CT ANGIO NECK W OR WO CONTRAST  Result Date: 05/19/2021 CLINICAL DATA:  Patient found down.  Intracranial hemorrhage. EXAM: CT ANGIOGRAPHY HEAD AND  NECK TECHNIQUE: Multidetector CT imaging of the head and neck was performed using the standard protocol during bolus administration of intravenous contrast. Multiplanar CT image reconstructions and MIPs were obtained to evaluate the vascular anatomy. Carotid stenosis measurements (when applicable) are obtained utilizing NASCET criteria, using the distal internal carotid diameter as the denominator. CONTRAST:  75mL OMNIPAQUE IOHEXOL 350 MG/ML SOLN COMPARISON:  Noncontrast head CT performed earlier today 05/19/2021. FINDINGS: CTA NECK FINDINGS Aortic arch: Standard aortic branching. The visualized aortic arch is normal in caliber. No hemodynamically significant innominate or proximal subclavian artery stenosis. Right carotid system: CCA and ICA patent within the neck without stenosis. No significant atherosclerotic disease. Left carotid system: CCA and ICA patent within the neck. Minimal soft and calcified plaque within the proximal left ICA results in less than 50% stenosis. Vertebral arteries: Codominant and patent within the neck without stenosis. Skeleton: Cervic advanced cervical spondylosis. Please refer to same day noncontrast CT of the cervical spine for a complete description of cervical spine findings. Other neck: No neck mass or cervical lymphadenopathy. Thyroid unremarkable. Upper chest: No consolidation within the imaged lung apices. Review of the MIP images confirms the above findings CTA HEAD FINDINGS Evaluation is somewhat limited due to suboptimal arterial contrast enhancement. Anterior circulation: The intracranial internal carotid arteries are patent. Mild nonstenotic calcified plaque within the left cavernous and paraclinoid segments. The M1 middle cerebral arteries are patent. No M2 proximal branch occlusion or  high-grade proximal M2 stenosis is identified. The anterior cerebral arteries are patent. The anterior cerebral arteries are patent. 3 mm aneurysm arising from the anterior communicating  artery complex (series 7, image 118) (series 8, image 102). Posterior circulation: The intracranial vertebral arteries are patent. The basilar artery is patent. The posterior cerebral arteries are patent. Posterior communicating arteries are hypoplastic or absent bilaterally. Venous sinuses: Within the limitations of contrast timing, no convincing thrombus. Anatomic variants: As described Review of the MIP images confirms the above findings These results were called by telephone at the time of interpretation on 05/19/2021 at 7:58 pm to provider Lorre Nick , who verbally acknowledged these results. IMPRESSION: CTA neck: The common carotid, internal carotid and vertebral arteries are patent within the neck without hemodynamically significant stenosis. Minimal soft and calcified plaque within the proximal left ICA. CTA head: 1. Somewhat limited examination due to suboptimal arterial enhancement. 2. 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 3. No intracranial large vessel occlusion or proximal high-grade arterial stenosis. Electronically Signed   By: Jackey Loge DO   On: 05/19/2021 19:59   CT Cervical Spine Wo Contrast  Result Date: 05/19/2021 CLINICAL DATA:  Found down/unresponsive. EXAM: CT CERVICAL SPINE WITHOUT CONTRAST TECHNIQUE: Multidetector CT imaging of the cervical spine was performed without intravenous contrast. Multiplanar CT image reconstructions were also generated. COMPARISON:  MRI of the cervical spine of 06/19/2020. Today's head CT is dictated separately (by neuroradiology). FINDINGS: Alignment: Spinal visualization through the top of T3. Straightening of expected cervical lordosis. Maintenance of vertebral body height. Mild anterolisthesis of C4 on C5 is similar to the prior MRI, given differences in technique. Skull base and vertebrae: Skull base intact. Coronal reformats demonstrate a normal C1-C2  articulation. Facets are well-aligned. Right-sided C3-4 facets are partially fused. Advanced spondylosis at C4 through C7 with endplate osteophytes, loss of intervertebral disc height. Soft tissues and spinal canal: Prevertebral soft tissues are within normal limits. Disc levels: Neural foraminal narrowing bilaterally at C3-4 and C4-5 secondary to uncovertebral joint hypertrophy. Right neural foraminal and central canal stenosis at C5-6 secondary to disc osteophyte complex and uncovertebral joint hypertrophy. Bilateral neural foraminal and central canal narrowing at C6-7 secondary to above factors. Upper chest: No apical pneumothorax. Other: None. IMPRESSION: Advanced cervical spondylosis, as detailed above. No acute superimposed process. Nonspecific straightening of expected cervical lordosis. Electronically Signed   By: Jeronimo Greaves M.D.   On: 05/19/2021 18:42    Assessment/Plan: 60 year old male presetned to the ED with AMS. CT head showed a large volume intraventricular hemorrhage with a large acute SAH. 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 We discussed the plan of care with his wife at bedside. We did place a ventricular catheter in the ED. The patient tolerated it well. We have discussed this case with Dr. Conchita Paris and he will obtain care of the patient.    Tiana Loft [facility] 05/19/2021 8:17 PM

## 2021-05-20 ENCOUNTER — Encounter (HOSPITAL_COMMUNITY)
Admission: EM | Disposition: A | Discharge status: Skilled Nursing Facility | Payer: Self-pay | Source: Home / Self Care | Attending: Neurosurgery

## 2021-05-20 ENCOUNTER — Other Ambulatory Visit (HOSPITAL_COMMUNITY): Payer: 59

## 2021-05-20 ENCOUNTER — Encounter (HOSPITAL_COMMUNITY): Payer: Self-pay | Admitting: Neurosurgery

## 2021-05-20 ENCOUNTER — Inpatient Hospital Stay (HOSPITAL_COMMUNITY): Payer: 59

## 2021-05-20 ENCOUNTER — Other Ambulatory Visit: Payer: Self-pay | Admitting: Interventional Radiology

## 2021-05-20 ENCOUNTER — Inpatient Hospital Stay (HOSPITAL_COMMUNITY): Payer: 59 | Admitting: Anesthesiology

## 2021-05-20 DIAGNOSIS — R4182 Altered mental status, unspecified: Secondary | ICD-10-CM | POA: Diagnosis not present

## 2021-05-20 HISTORY — PX: IR ANGIO INTRA EXTRACRAN SEL INTERNAL CAROTID UNI R MOD SED: IMG5362

## 2021-05-20 HISTORY — PX: IR ANGIOGRAM FOLLOW UP STUDY: IMG697

## 2021-05-20 HISTORY — PX: RADIOLOGY WITH ANESTHESIA: SHX6223

## 2021-05-20 HISTORY — PX: IR TRANSCATH/EMBOLIZ: IMG695

## 2021-05-20 LAB — TYPE AND SCREEN
ABO/RH(D): O POS
Antibody Screen: NEGATIVE

## 2021-05-20 LAB — ABO/RH: ABO/RH(D): O POS

## 2021-05-20 LAB — MAGNESIUM: Magnesium: 2.5 mg/dL — ABNORMAL HIGH (ref 1.7–2.4)

## 2021-05-20 SURGERY — IR WITH ANESTHESIA
Anesthesia: General

## 2021-05-20 MED ORDER — CLEVIDIPINE BUTYRATE 0.5 MG/ML IV EMUL
INTRAVENOUS | Status: AC
  Filled 2021-05-20: qty 50

## 2021-05-20 MED ORDER — ROCURONIUM BROMIDE 10 MG/ML (PF) SYRINGE
PREFILLED_SYRINGE | INTRAVENOUS | Status: DC | PRN
  Administered 2021-05-20: 50 mg via INTRAVENOUS
  Administered 2021-05-20: 20 mg via INTRAVENOUS

## 2021-05-20 MED ORDER — FENTANYL CITRATE (PF) 100 MCG/2ML IJ SOLN: 25.0000 ug | INTRAMUSCULAR | Status: DC | PRN

## 2021-05-20 MED ORDER — CLEVIDIPINE BUTYRATE 0.5 MG/ML IV EMUL
INTRAVENOUS | Status: DC | PRN
  Administered 2021-05-20: 10 mg/h via INTRAVENOUS

## 2021-05-20 MED ORDER — PHENYLEPHRINE 40 MCG/ML (10ML) SYRINGE FOR IV PUSH (FOR BLOOD PRESSURE SUPPORT)
PREFILLED_SYRINGE | INTRAVENOUS | Status: DC | PRN
  Administered 2021-05-20: 80 ug via INTRAVENOUS
  Administered 2021-05-20: 160 ug via INTRAVENOUS

## 2021-05-20 MED ORDER — LIDOCAINE 2% (20 MG/ML) 5 ML SYRINGE
INTRAMUSCULAR | Status: DC | PRN
  Administered 2021-05-20: 60 mg via INTRAVENOUS

## 2021-05-20 MED ORDER — ESMOLOL HCL 100 MG/10ML IV SOLN
INTRAVENOUS | Status: DC | PRN
  Administered 2021-05-20: 30 mg via INTRAVENOUS

## 2021-05-20 MED ORDER — PROPOFOL 10 MG/ML IV BOLUS
INTRAVENOUS | Status: DC | PRN
  Administered 2021-05-20: 40 mg via INTRAVENOUS
  Administered 2021-05-20: 100 mg via INTRAVENOUS

## 2021-05-20 MED ORDER — FENTANYL CITRATE (PF) 250 MCG/5ML IJ SOLN
INTRAMUSCULAR | Status: AC
  Filled 2021-05-20: qty 5

## 2021-05-20 MED ORDER — PROMETHAZINE HCL 25 MG/ML IJ SOLN: 6.2500 mg | INTRAMUSCULAR | Status: DC | PRN

## 2021-05-20 MED ORDER — ATROPINE SULFATE 1 MG/10ML IJ SOSY
PREFILLED_SYRINGE | INTRAMUSCULAR | Status: AC
  Filled 2021-05-20: qty 10

## 2021-05-20 MED ORDER — CHLORHEXIDINE GLUCONATE 0.12 % MT SOLN
OROMUCOSAL | Status: AC
  Filled 2021-05-20: qty 15

## 2021-05-20 MED ORDER — GLYCOPYRROLATE PF 0.2 MG/ML IJ SOSY
PREFILLED_SYRINGE | INTRAMUSCULAR | Status: DC | PRN
  Administered 2021-05-20 (×2): .2 mg via INTRAVENOUS

## 2021-05-20 MED ORDER — FENTANYL CITRATE (PF) 250 MCG/5ML IJ SOLN
INTRAMUSCULAR | Status: DC | PRN
  Administered 2021-05-20 (×4): 50 ug via INTRAVENOUS

## 2021-05-20 MED ORDER — CLEVIDIPINE BUTYRATE 0.5 MG/ML IV EMUL
0.0000 mg/h | INTRAVENOUS | Status: DC
  Administered 2021-05-20: 8 mg/h via INTRAVENOUS
  Administered 2021-05-20: 12 mg/h via INTRAVENOUS
  Administered 2021-05-20: 8 mg/h via INTRAVENOUS
  Administered 2021-05-20: 10 mg/h via INTRAVENOUS
  Administered 2021-05-21: 21 mg/h via INTRAVENOUS
  Administered 2021-05-21: 8 mg/h via INTRAVENOUS
  Administered 2021-05-21: 17 mg/h via INTRAVENOUS
  Administered 2021-05-21: 15 mg/h via INTRAVENOUS
  Filled 2021-05-20 (×5): qty 50
  Filled 2021-05-20: qty 100

## 2021-05-20 MED ORDER — CHLORHEXIDINE GLUCONATE CLOTH 2 % EX PADS
6.0000 | MEDICATED_PAD | Freq: Every day | CUTANEOUS | Status: DC
  Administered 2021-05-22 – 2021-07-05 (×38): 6 via TOPICAL

## 2021-05-20 MED ORDER — CEFAZOLIN SODIUM-DEXTROSE 1-4 GM/50ML-% IV SOLN
1.0000 g | Freq: Three times a day (TID) | INTRAVENOUS | Status: DC
  Administered 2021-05-20 – 2021-05-27 (×21): 1 g via INTRAVENOUS
  Filled 2021-05-20 (×21): qty 50

## 2021-05-20 MED ORDER — PROPOFOL 10 MG/ML IV BOLUS
INTRAVENOUS | Status: AC
  Filled 2021-05-20: qty 20

## 2021-05-20 MED ORDER — FENTANYL CITRATE (PF) 100 MCG/2ML IJ SOLN
INTRAMUSCULAR | Status: AC
  Filled 2021-05-20: qty 4

## 2021-05-20 NOTE — Progress Notes (Signed)
PCCM Interval Progress Note  PCCM Ground Team contacted by E-Link Team to assess patient for safety/suitability for STAT CT Head without airway in place. Patient s/p SAH with IR intervention; reportedly "extremely lethargic and not really following commands".  On patient assessment, patient will open his eyes to voice, open eyes wide/close them when asked, attempts to stick out tongue when asked. Following commands with BLE, not following commands with BUE but moving these purposefully (trying to reach his face with RUE. Patient also noted trying to reposition himself in bed.  Vitals stable at time of assessment with HR 70s, MAPs 80s (on Cleviprex), RR 13, O2 sat 98-100% on RA. Patient appears to have a clear mental status change with deficits, but appears to be protecting his airway at this time. Recommend proceeding to STAT CT Head. D/w Dr. Mikki Harbor.  Tim Lair, PA-C [facility] 05/20/21 8:37 PM  Please see [email or web address] for pager details.  From 7A-7P if no response, please call [phone] After hours, please call E-Link [phone]

## 2021-05-20 NOTE — Transfer of Care (Signed)
Immediate Anesthesia Transfer of Care Note  Patient: [name] [name]  Procedure(s) Performed: IR WITH ANESTHESIA (N/A )  Patient Location: PACU  Anesthesia Type:General  Level of Consciousness: responds to stimulation  Airway & Oxygen Therapy: Patient Spontanous Breathing  Post-op Assessment: Report given to RN and Post -op Vital signs reviewed and stable  Post vital signs: Reviewed and stable  Last Vitals:  Vitals Value Taken Time  BP 104/64 05/20/21 1636  Temp    Pulse 71   Resp 15 05/20/21 1641  SpO2 100   Vitals shown include unvalidated device data.  Last Pain:  Vitals:   05/20/21 1211  TempSrc:   PainSc: 0-No pain         Complications: No complications documented.

## 2021-05-20 NOTE — Anesthesia Preprocedure Evaluation (Addendum)
Anesthesia Evaluation  Patient identified by MRN, date of birth, ID band Patient confused    Reviewed: Allergy & Precautions, H&P , NPO status , Patient's Chart, lab work & pertinent test results  History of Anesthesia Complications Negative for: history of anesthetic complications  Airway Mallampati: II  TM Distance: >3 FB Neck ROM: Full    Dental no notable dental hx. (+) Teeth Intact, Dental Advisory Given   Pulmonary Current Smoker and Patient abstained from smoking.,    Pulmonary exam normal breath sounds clear to auscultation       Cardiovascular negative cardio ROS   Rhythm:Regular Rate:Normal     Neuro/Psych SAH s/p ventriculostomy negative neurological ROS  negative psych ROS   GI/Hepatic negative GI ROS, (+)     substance abuse (remote cocaine use)  alcohol use and marijuana use,   Endo/Other  negative endocrine ROS  Renal/GU negative Renal ROS  negative genitourinary   Musculoskeletal negative musculoskeletal ROS (+)   Abdominal   Peds  Hematology negative hematology ROS (+)   Anesthesia Other Findings   Reproductive/Obstetrics negative OB ROS                           Anesthesia Physical Anesthesia Plan  ASA: III  Anesthesia Plan: General   Post-op Pain Management:    Induction: Intravenous  PONV Risk Score and Plan: 2 and Treatment may vary due to age or medical condition, Ondansetron and Dexamethasone  Airway Management Planned: Oral ETT  Additional Equipment: Arterial line  Intra-op Plan:   Post-operative Plan: Extubation in OR  Informed Consent: I have reviewed the patients History and Physical, chart, labs and discussed the procedure including the risks, benefits and alternatives for the proposed anesthesia with the patient or authorized representative who has indicated his/her understanding and acceptance.     Dental advisory given  Plan Discussed  with: CRNA and Anesthesiologist  Anesthesia Plan Comments:       Anesthesia Quick Evaluation

## 2021-05-20 NOTE — Progress Notes (Signed)
eLink Physician-Brief Progress Note Patient Name: [name] [name] DOB: 1961/01/01 MRN: [id number]   Date of Service  05/20/2021  HPI/Events of Note  Patient admitted with subarachnoid hemorrhage, he had IR intervention for an aneurysm this evening and is breathing spontaneously, however he's extremely lethargic and not following commands. Bedside RN states he has not received narcotics or other sedating medication post-procedure.  eICU Interventions  Will have the PCCM ground crew see him to determine if he's safe to go to CT without intubation. Stat CT brain ordered to exclude extension of hemorrhage vs increased ICP.        [name] 05/20/2021, 8:07 PM

## 2021-05-20 NOTE — Anesthesia Postprocedure Evaluation (Signed)
Anesthesia Post Note  Patient: KOI ZANGARA  Procedure(s) Performed: IR WITH ANESTHESIA (N/A )     Patient location during evaluation: PACU Anesthesia Type: General Level of consciousness: awake and alert Pain management: pain level controlled Vital Signs Assessment: post-procedure vital signs reviewed and stable Respiratory status: spontaneous breathing, nonlabored ventilation and respiratory function stable Cardiovascular status: blood pressure returned to baseline and stable Postop Assessment: no apparent nausea or vomiting Anesthetic complications: no   No complications documented.  Last Vitals:  Vitals:   05/20/21 1707 05/20/21 1722  BP: 123/80 114/63  Pulse: 79 65  Resp: 10 15  Temp:    SpO2: 98% 95%    Last Pain:  Vitals:   05/20/21 1637  TempSrc:   PainSc: 0-No pain                 Danya Spearman,W. EDMOND

## 2021-05-20 NOTE — Progress Notes (Signed)
  NEUROSURGERY PROGRESS NOTE   No issues overnight. Pts history reviewed with Dr. Wynetta Emery and in EMR. Presented with lethargy/mental status change. EVD placed last night.  No known hx of HTN, active smoker. Unknown family hx.  EXAM:  BP 128/80   Pulse (!) 59   Temp 97.7 F (36.5 C) (Oral)   Resp (!) 26   Ht 5\' 9"  (1.753 m)   Wt 63.5 kg   SpO2 93%   BMI 20.67 kg/m   Opens eyes to voice Follows commands with stimulation Responds verbally to simple questions with stimulation Moves all extremities equally  IMAGING: CTH demonstrates primarily left-sided basal/sylvian and interhemispheric SAH with large primarily right-sided IVH and blood which has dissected through a cavum septum/vergae. There is associated HCP.  CTA is of suboptimal quality with significant venous opacification but demonstrates a leftward projecting aneurysm of the right A1-A2 junction as the likely source of hemorrhage  IMPRESSION:  60 y.o. 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   PLAN: - Will proceed with diagnostic angiogram, appropriate treatment of aneurysm  I spoke at length with the patient's mother over the phone regarding the imaging findings thus far. We discussed the need for diagnostic angiogram and I also reviewed the possible treatment options for intracranial aneurysms including endovascular coiling and open clip ligation. The risks of the angiogram, coiling, and surgical clipping were also reviewed to include stroke and aneurysm re-rupture leading to weakness/paralysis/coma/death, infection, SZ, arterial dissection, contrast reaction, nephropathy, and groin hematoma.   The patient's mother understood our discussion and she provided consent to proceed with diagnostic angiogram and the appropriate treatment for any identified aneurysm.    46, MD [facility]

## 2021-05-20 NOTE — Progress Notes (Signed)
NAME:  [name] [name], MRN:  [id number], DOB:  [birth year], LOS: 1 ADMISSION DATE:  05/19/2021, CONSULTATION DATE:  05/20/21 REFERRING MD:  Lisbeth Renshaw CHIEF COMPLAINT:  SAH  History of Present Illness:  Pt is a 14 you w/ PMH sinusitis, rotator cuff tear, tobacco, marijuana, and ETOH use, remote hx of crack cocaine use who is being managed for intraventricular hemorrhage w/ large acute SAH now s/p ventriculostomy.  Pt notable seen in ED for frontal headache and had negative heat CT at that time. Pt spoke with family on Monday. Wednesday did not come to work prompting call to EMS; EMS found minimally responsive at home. PT's mother denies knowledge of any recent trauma.   CT head on admission SAH and CTA showed 3 mm aneurysm from anterior communicating artery thought to have ruptured and caused hemorrhage. Ventriculostomy done at bedside in ED.    Pertinent  Medical History  Sinusitis Tobacco, ETOH, and marijuana use Remote history cocaine use Rotator cuff tear   Significant Hospital Events: Including procedures, antibiotic start and stop dates in addition to other pertinent events   . 5/25 ventriculostomy . 5/26 planned OR for coil vs clipping vs mgmt SAH . Ancef 5/25 ->  Interim History / Subjective:  Unable to place NG tube overnight secondary to patient agitation. Nurse observed multiple episodes of bradycardia to 20s which self resolved w/ HR spiking to 90s and settling into 40/50s.    Objective   Blood pressure 140/70, pulse 60, temperature 97.7 F (36.5 C), temperature source Oral, resp. rate 20, height 5\' 9"  (1.753 m), weight 63.5 kg, SpO2 99 %.        Intake/Output Summary (Last 24 hours) at 05/20/2021 1359 Last data filed at 05/20/2021 1100 Gross per 24 hour  Intake 2309.39 ml  Output 830 ml  Net 1479.39 ml   Filed Weights   05/20/21 0600 05/20/21 1211  Weight: 63.5 kg 63.5 kg    Examination: General: NAD, lying in hospital bed with eyes closed HENT:  pupils equal and round, and non reactive to light bilaterally.  Lungs: Normal WOB on RA Cardiovascular: RRR, no m/r/g Abdomen: soft, normoactive bowel sounds Extremities: warm and well perfused in all 4 extremities, shortened left first toe w/ no toenail Neuro: grimaces and pushes away to painful stimulus, no startle to threat response, moves toes bilaterally on command but not upper extremities GU: deferred  Labs/imaging that I have personally reviewed  (right click and "Reselect all SmartList Selections" daily)    Lab Orders     Urine Culture     Resp Panel by RT-PCR (Flu A&B, Covid) Nasopharyngeal Swab     MRSA PCR Screening     Ethanol     Rapid urine drug screen (hospital performed)     Salicylate level     Acetaminophen level     CBC with Differential/Platelet     Comprehensive metabolic panel     Urinalysis, Routine w reflex microscopic     Lactic acid, plasma     HIV Antibody (routine testing w rflx)     CBC     Protime-INR     APTT     Magnesium     CBG monitoring, ED   Imaging:  CT Head Wo contrast  CT cervical spine wo contrast  Ct angio head, neck  CXR Chest   Resolved Hospital Problem list    No resolved hospital problems to date  Current problems Patient Active Problem List   Diagnosis Date  Noted  . SAH (subarachnoid hemorrhage) (HCC) 05/19/2021  . Substance induced mood disorder (HCC) 10/31/2016    Assessment & Plan:   #Subarrachnoid Hemorrhage - IVH and SAH on CT 5/25; 3 mm ACA aneurysm on CTA, suspected ruptured and cause of hemorrhage - s/p ventriculostomy 5/25 - OR planned 5/26 for definitive mgmt: coil embolization vs clipping - mgmt per neurosurgery, appreciate recs - goal SBP <140 or per neurosurgery recs (d/c Labetelol 5/25)  - continue nifedipine for vasospasm prevention - for evaluation of vasospasm, transcranial doppler every Monday, Wednesday, and Friday - AED keppra - Cortrak planned tomorrow 5/27 - continue ancef while shunt  remains in place   # Episodic Bradycardia  - pt w/ SAH s/p ventriculostomy pending defintive mgmt w/ multiple, self-resolving episodes of bradycardia to 20s overnight w/ otherwise stable vitals, likely 2/2 to [facility] - EKG 5/25 shows sinus bradycardia, LVH, no signs of heart block - D/c labetelol 5/26 for possible contributor to bradycardia - consider repeat EKG, echo if bradycardia worsens or becomes symptomatic or hemodynamically unstable  #Tobacco use - stable - Offer tobacco use counseling, NRT as needed pending pt stabilization  Best practice (right click and "Reselect all SmartList Selections" daily)  Diet:  NPO Pain/Anxiety/Delirium protocol (if indicated): No VAP protocol (if indicated): Not indicated DVT prophylaxis: SCD GI prophylaxis: PPI Glucose control:  SSI No Central venous access:  N/A Arterial line:  N/A Foley:  N/A Mobility:  bed rest  PT consulted: N/A Last date of multidisciplinary goals of care discussion [per primary] Code Status:  full code Disposition: admit to ICU   Labs   CBC: Recent Labs  Lab 05/19/21 1753 05/19/21 2021  WBC 16.4* 19.1*  NEUTROABS 14.1*  --   HGB 14.9 13.7  HCT 44.5 39.3  MCV 98.2 96.1  PLT 335 332    Basic Metabolic Panel: Recent Labs  Lab 05/19/21 1753 05/20/21 1133  NA 141  --   K 3.6  --   CL 107  --   CO2 23  --   GLUCOSE 128*  --   BUN 18  --   CREATININE 0.68  --   CALCIUM 9.1  --   MG  --  2.5*   GFR: Estimated Creatinine Clearance: 89.3 mL/min (by C-G formula based on SCr of 0.68 mg/dL). Recent Labs  Lab 05/19/21 1753 05/19/21 2021  WBC 16.4* 19.1*  LATICACIDVEN 1.8  --     Liver Function Tests: Recent Labs  Lab 05/19/21 1753  AST 91*  ALT 41  ALKPHOS 50  BILITOT 1.1  PROT 6.8  ALBUMIN 3.5   No results for input(s): LIPASE, AMYLASE in the last 168 hours. No results for input(s): AMMONIA in the last 168 hours.  ABG    Component Value Date/Time   HCO3 25.1 (H) 01/24/2008 2351   TCO2 26  01/24/2008 2351   ACIDBASEDEF 1.0 01/24/2008 2351     Coagulation Profile: Recent Labs  Lab 05/19/21 2021  INR 1.0    Cardiac Enzymes: No results for input(s): CKTOTAL, CKMB, CKMBINDEX, TROPONINI in the last 168 hours.  HbA1C: Hemoglobin A1C  Date/Time Value Ref Range Status  12/14/2013 05:30 AM 5.6 4.2 - 6.3 % Final    Comment:    The American Diabetes Association recommends that a primary goal of therapy should be <7% and that physicians should reevaluate the treatment regimen in patients with HbA1c values consistently >8%.     CBG: Recent Labs  Lab 05/19/21 1752  GLUCAP 132*  Critical care time: The patient is critically ill with multiple organ systems failure and requires high complexity decision making for assessment and support, frequent evaluation and titration of therapies, application of advanced monitoring technologies and extensive interpretation of multiple databases.  Critical care time 35 mins. This represents my time independent of the NPs time taking care of the pt. This is excluding procedures.    Briant Sites DO [facility] 05/20/2021, 6:51 PM See Amion for pager If no response to pager, please call [phone] until 1900 After 1900 please call [facility] [phone]

## 2021-05-20 NOTE — Progress Notes (Signed)
Dr Arsenio Loader made aware that patients HR occasionally dips into the 30's.  Atropine placed at bedside, no new orders at this time.  Patient is currently stable and resting comfortably.

## 2021-05-20 NOTE — Progress Notes (Signed)
Pt unable to take PO meds d/t mental status and inability to safely swallow meds. Dr. Conchita Paris aware that Nimotop is unable to be given.

## 2021-05-20 NOTE — Progress Notes (Signed)
Pharmacy Antibiotic Note  [name] [name] is a 60 y.o. male admitted on 05/19/2021 with a large volume intraventricular hemorrhage with a large acute SAH, now w/ ventriculostomy.  Pharmacy has been consulted for Ancef dosing.  Plan: Ancef 1g IV Q8H until drain removed.  Height: 5\' 9"  (175.3 cm) Weight: 63.5 kg (139 lb 15.9 oz) IBW/kg (Calculated) : 70.7  Temp (24hrs), Avg:98.3 F (36.8 C), Min:97.6 F (36.4 C), Max:99 F (37.2 C)  Recent Labs  Lab 05/19/21 1753 05/19/21 2021  WBC 16.4* 19.1*  CREATININE 0.68  --   LATICACIDVEN 1.8  --     Estimated Creatinine Clearance: 89.3 mL/min (by C-G formula based on SCr of 0.68 mg/dL).    No Known Allergies   Thank you for allowing pharmacy to be a part of this patient's care.  05/21/21, PharmD, BCPS  05/20/2021 6:33 AM

## 2021-05-20 NOTE — Brief Op Note (Signed)
  NEUROSURGERY BRIEF OPERATIVE  NOTE   PREOP DX: Subarachnoid Hemorrhage  POSTOP DX: Same  PROCEDURE: Diagnostic cerebral angiogram  SURGEON: Dr. Lisbeth Renshaw, MD  ANESTHESIA: GETA  EBL: Minimal  SPECIMENS: None  COMPLICATIONS: None  CONDITION: Stable to recovery  FINDINGS (Full report in [facility]): 1. Successful coil embolization of right A1-A2 junction aneurysm 2. Vasospasm involving the bilateral supraclinoid ICA, A1, and M1.   Lisbeth Renshaw, MD [facility]

## 2021-05-20 NOTE — Anesthesia Procedure Notes (Signed)
Arterial Line Insertion Start/End5/26/2022 2:57 PM Performed by: Drema Pry, CRNA, CRNA  Patient location: OR. Preanesthetic checklist: patient identified, IV checked, risks and benefits discussed, surgical consent, monitors and equipment checked and pre-op evaluation Left, radial was placed Catheter size: 20 G Hand hygiene performed  and maximum sterile barriers used   Attempts: 1 Procedure performed without using ultrasound guided technique. Following insertion, dressing applied and Biopatch. Post procedure assessment: normal  Patient tolerated the procedure well with no immediate complications.

## 2021-05-20 NOTE — Anesthesia Procedure Notes (Signed)
Procedure Name: Intubation Date/Time: 05/20/2021 2:48 PM Performed by: Janace Litten, CRNA Pre-anesthesia Checklist: Patient identified, Emergency Drugs available, Suction available and Patient being monitored Patient Re-evaluated:Patient Re-evaluated prior to induction Oxygen Delivery Method: Circle System Utilized Preoxygenation: Pre-oxygenation with 100% oxygen Induction Type: IV induction Ventilation: Mask ventilation without difficulty Laryngoscope Size: Mac and 4 Grade View: Grade I Tube type: Oral Tube size: 7.5 mm Number of attempts: 1 Airway Equipment and Method: Stylet Placement Confirmation: ETT inserted through vocal cords under direct vision,  positive ETCO2 and breath sounds checked- equal and bilateral Secured at: 23 cm Tube secured with: Tape Dental Injury: Teeth and Oropharynx as per pre-operative assessment

## 2021-05-21 ENCOUNTER — Inpatient Hospital Stay (HOSPITAL_COMMUNITY): Payer: 59

## 2021-05-21 ENCOUNTER — Encounter (HOSPITAL_COMMUNITY): Payer: Self-pay | Admitting: Neurosurgery

## 2021-05-21 DIAGNOSIS — I609 Nontraumatic subarachnoid hemorrhage, unspecified: Secondary | ICD-10-CM

## 2021-05-21 DIAGNOSIS — R001 Bradycardia, unspecified: Secondary | ICD-10-CM | POA: Diagnosis not present

## 2021-05-21 DIAGNOSIS — R4182 Altered mental status, unspecified: Secondary | ICD-10-CM | POA: Diagnosis not present

## 2021-05-21 LAB — GLUCOSE, CAPILLARY
Glucose-Capillary: 123 mg/dL — ABNORMAL HIGH (ref 70–99)
Glucose-Capillary: 161 mg/dL — ABNORMAL HIGH (ref 70–99)
Glucose-Capillary: 167 mg/dL — ABNORMAL HIGH (ref 70–99)

## 2021-05-21 LAB — BASIC METABOLIC PANEL
Anion gap: 8 (ref 5–15)
BUN: 10 mg/dL (ref 6–20)
CO2: 23 mmol/L (ref 22–32)
Calcium: 8.1 mg/dL — ABNORMAL LOW (ref 8.9–10.3)
Chloride: 113 mmol/L — ABNORMAL HIGH (ref 98–111)
Creatinine, Ser: 0.59 mg/dL — ABNORMAL LOW (ref 0.61–1.24)
GFR, Estimated: >60 mL/min (ref >60)
Glucose, Bld: 88 mg/dL (ref 70–99)
Potassium: 3.2 mmol/L — ABNORMAL LOW (ref 3.5–5.1)
Sodium: 144 mmol/L (ref 135–145)

## 2021-05-21 LAB — CBC
HCT: 36.3 % — ABNORMAL LOW (ref 39.0–52.0)
Hemoglobin: 12.3 g/dL — ABNORMAL LOW (ref 13.0–17.0)
MCH: 32.7 pg (ref 26.0–34.0)
MCHC: 33.9 g/dL (ref 30.0–36.0)
MCV: 96.5 fL (ref 80.0–100.0)
Platelets: 302 10*3/uL (ref 150–400)
RBC: 3.76 MIL/uL — ABNORMAL LOW (ref 4.22–5.81)
RDW: 12.7 % (ref 11.5–15.5)
WBC: 11.5 10*3/uL — ABNORMAL HIGH (ref 4.0–10.5)
nRBC: 0 % (ref 0.0–0.2)

## 2021-05-21 LAB — PHOSPHORUS: Phosphorus: 2.8 mg/dL (ref 2.5–4.6)

## 2021-05-21 LAB — MAGNESIUM: Magnesium: 2.3 mg/dL (ref 1.7–2.4)

## 2021-05-21 MED ORDER — DOCUSATE SODIUM 50 MG/5ML PO LIQD
100.0000 mg | Freq: Two times a day (BID) | ORAL | Status: DC
  Administered 2021-05-21 – 2021-05-23 (×5): 100 mg
  Filled 2021-05-21 (×6): qty 10

## 2021-05-21 MED ORDER — PROSOURCE TF PO LIQD: 45.0000 mL | Freq: Two times a day (BID) | ORAL | Status: DC

## 2021-05-21 MED ORDER — VITAL HIGH PROTEIN PO LIQD: 1000.0000 mL | ORAL | Status: DC

## 2021-05-21 MED ORDER — JEVITY 1.2 CAL PO LIQD
1000.0000 mL | ORAL | Status: DC
  Administered 2021-05-21 – 2021-06-10 (×22): 1000 mL
  Filled 2021-05-21 (×33): qty 1000

## 2021-05-21 NOTE — Progress Notes (Signed)
Initial Nutrition Assessment  DOCUMENTATION CODES:   Not applicable  INTERVENTION:   -Once cortrak placement is verified:  Initiate Jevity 1.2 @ 65 ml/hr via cortrak (1560 ml/ day)  Tube feeding regimen provides 1872 kcal (100% of needs), 87 grams of protein, and 1259 ml of H2O.   NUTRITION DIAGNOSIS:   Inadequate oral intake related to inability to eat as evidenced by NPO status.  GOAL:   Patient will meet greater than or equal to 90% of their needs  MONITOR:   Diet advancement,Labs,Weight trends,TF tolerance,Skin,I & O's  REASON FOR ASSESSMENT:   Other (Comment)    ASSESSMENT:   Pt is a 26 you w/ PMH sinusitis, rotator cuff tear, tobacco, marijuana, and ETOH use, remote hx of crack cocaine use who is being managed for intraventricular hemorrhage w/ large acute SAH now s/p ventriculostomy.  Pt admitted with [facility].   5/26- s/p cerebral angiogram  Reviewed I/O's: +1.9 L x 24 hours and +2.9 L since admission  UOP: 646 ml x 24 hours  Drain output: 63 ml x 24 hours  Pt lethargic at time of visit. He was able to open his eyes when name was called, but unable to answer questions.   Spoke with pt mother at bedside, who reports that pt had a very good appetite PTA. She shares that pt typically consumes meat, starch, and vegetables at meals. Pt lived alone PTA and was very active- per mom, pt walked to work and to her home, both of which are a fair distance from his home.   Per mom, she does not think that pt has lost any weight. Reviewed wt hx; wt has been stable over the past 6 months.  Cortrak placed during RD visit. RD discussed with pt mother how pt would receive nutrition.  Case discussed with RN and MD; received permission to start TF once tube placement verified. Celiprex has been turned off.  Medications reviewed and include colace, 0.9% sodium chloride infusion @ 100 ml/hr,  And keppra.  Labs reviewed: Mg: 2.5.   NUTRITION - FOCUSED PHYSICAL  EXAM:  Flowsheet Row Most Recent Value  Orbital Region No depletion  Upper Arm Region No depletion  Thoracic and Lumbar Region No depletion  Buccal Region No depletion  Temple Region No depletion  Clavicle Bone Region Mild depletion  Clavicle and Acromion Bone Region No depletion  Scapular Bone Region No depletion  Dorsal Hand No depletion  Patellar Region No depletion  Anterior Thigh Region No depletion  Posterior Calf Region No depletion  Edema (RD Assessment) None  Hair Reviewed  Eyes Reviewed  Mouth Reviewed  Skin Reviewed  Nails Reviewed       Diet Order:   Diet Order    None      EDUCATION NEEDS:   Education needs have been addressed  Skin:  Skin Assessment: Skin Integrity Issues: Skin Integrity Issues:: Incisions Incisions: closed rt groin  Last BM:  Unknown  Height:   Ht Readings from Last 1 Encounters:  05/20/21 5\' 9"  (1.753 m)    Weight:   Wt Readings from Last 1 Encounters:  05/20/21 63.5 kg    Ideal Body Weight:  72.7 kg  BMI:  Body mass index is 20.67 kg/m.  Estimated Nutritional Needs:   Kcal:  1700-1900  Protein:  85-100 grams  Fluid:  > 1.7 L    05/22/21, RD, LDN, CDCES Registered Dietitian II Certified Diabetes Care and Education Specialist Please refer to [facility] for RD and/or RD on-call/weekend/after  hours pager

## 2021-05-21 NOTE — Progress Notes (Signed)
Transcranial Doppler  Date POD PCO2 HCT BP  MCA ACA PCA OPHT SIPH VERT Basilar  5/27 MS     Right  Left   154  158   -54  -37   21  25   28  21    *  *   -21  -28     -65         Right  Left                                            Right  Left                                             Right  Left                                             Right  Left                                            Right  Left                                            Right  Left                                        MCA = Middle Cerebral Artery      OPHT = Opthalmic Artery     BASILAR = Basilar Artery   ACA = Anterior Cerebral Artery     SIPH = Carotid Siphon PCA = Posterior Cerebral Artery   VERT = Verterbral Artery                   Normal MCA = 62+\-12 ACA = 50+\-12 PCA = 42+\-23  *Unable to insonate  05/21/21- Right Lindegaard ratio= 5.9, Left Lindegaard ratio= 5.9. Preliminary results discussed with 05/23/21, RN.  05/21/2021 4:02 PM 05/23/2021., MHA, RVT, RDCS, RDMS

## 2021-05-21 NOTE — Progress Notes (Signed)
Bedside round with Dr Conchita Paris. Orders received to allow BP up to 200, and raise drain to 5 mm Hg.

## 2021-05-21 NOTE — TOC CAGE-AID Note (Signed)
Transition of Care [facility]) - CAGE-AID Screening   Patient Details  Name: [name] [name] MRN: [id number] Date of Birth: 05/09/1961  [facility]) CM/SW Contact:    Mearl Latin, LCSW Phone Number: 05/21/2021, 10:28 AM   Clinical Narrative: Patient is disoriented and unable to participate in screening.    CAGE-AID Screening: Substance Abuse Screening unable to be completed due to: : Patient unable to participate

## 2021-05-21 NOTE — Procedures (Signed)
Cortrak  Person Inserting Tube:  Kendell Bane C, RD Tube Type:  Cortrak - 43 inches Tube Location:  Left nare Initial Placement:  Stomach Secured by: Bridle Technique Used to Measure Tube Placement:  Documented cm marking at nare/ corner of mouth Cortrak Secured At:  64 cm    Cortrak Tube Team Note:  Consult received to place a Cortrak feeding tube.   X-ray is required, abdominal x-ray has been ordered by the Cortrak team. Please confirm tube placement before using the Cortrak tube.   If the tube becomes dislodged please keep the tube and contact the Cortrak team at [email or web address] ([email or web address]) for replacement.  If after hours and replacement cannot be delayed, place a NG tube and confirm placement with an abdominal x-ray.    Cammy Copa., RD, LDN, CNSC See AMiON for contact information

## 2021-05-21 NOTE — Progress Notes (Signed)
Vasc US reported bilateral MCA vasospasm. MD aware per OP note yest and discussion today, and orders to increase BP.

## 2021-05-21 NOTE — Progress Notes (Addendum)
NAME:  [name] [name], MRN:  [id number], DOB:  04-02-1961, LOS: 2 ADMISSION DATE:  05/19/2021, CONSULTATION DATE:  05/20/21 REFERRING MD:  Lisbeth Renshaw CHIEF COMPLAINT:  SAH  History of Present Illness:  Pt is a 69 you w/ PMH sinusitis, rotator cuff tear, tobacco, marijuana, and ETOH use, remote hx of crack cocaine use who is being managed for intraventricular hemorrhage w/ large acute SAH now s/p ventriculostomy.  Pt notable seen in ED for frontal headache and had negative heat CT at that time. Pt spoke with family on Monday. Wednesday did not come to work prompting call to EMS; EMS found minimally responsive at home. PT's mother denies knowledge of any recent trauma.   CT head on admission SAH and CTA showed 3 mm aneurysm from anterior communicating artery thought to have ruptured and caused hemorrhage. Ventriculostomy done at bedside in ED.    Pertinent  Medical History  Sinusitis Tobacco, ETOH, and marijuana use Remote history cocaine use Rotator cuff tear Left toe amputation   Significant Hospital Events: Including procedures, antibiotic start and stop dates in addition to other pertinent events   . 5/25 ventriculostomy . 5/26 coil embolization . Ancef 5/25 ->  Interim History / Subjective:  E-link team concerned overnight for pt being "extremely lethargic and not really following commands".  PCCM determined pt was protecting his airway, and pt was sent to CT for eval of worsening SAH, increased intracranial pressure. CT showed stable hemorrhage w/ no mass defect or midline shift.   Pt's mother present in room this morning. Per nursing, pt more responsive and talking some this morning.   Objective   Blood pressure (!) 155/94, pulse 89, temperature 99.4 F (37.4 C), temperature source Axillary, resp. rate 15, height 5\' 9"  (1.753 m), weight 63.5 kg, SpO2 100 %.        Intake/Output Summary (Last 24 hours) at 05/21/2021 1333 Last data filed at 05/21/2021 1200 Gross per  24 hour  Intake 2445.96 ml  Output 736 ml  Net 1709.96 ml   Filed Weights   05/20/21 0600 05/20/21 1211  Weight: 63.5 kg 63.5 kg    Examination: General: NAD, lying in hospital bed with eyes open HENT: pupils equal and round, and reactive to light bilaterally. Does not track movement w/ eyes bilaterally.  Lungs: Normal WOB on RA Cardiovascular: RRR, no m/r/g Abdomen: soft, normoactive bowel sounds Extremities: warm and well perfused in all 4 extremities, shortened left first toe w/ no toenail Neuro: opens eyes and moves all 4 extremities spontaneously, grimaces to painful stimulus, no startle to threat response, moves right toes on command but not left or upper extremities GU: deferred  Labs/imaging that I have personally reviewed  (right click and "Reselect all SmartList Selections" daily)  Na 144 K 3.2 Cl 113 CO2 23 WBC 11.5 Hgb 12.3  KUB shows feeding tube in appropriate location CT head last night shows stable hemorrhage, personally reviewed images  Resolved Hospital Problem list      Assessment & Plan:   #Subarrachnoid, Intraparenchymal, and Intraventricular Hemorrhage - IVH and SAH on CT 5/25; 3 mm ACA aneurysm on CTA, suspected ruptured and cause of hemorrhage - s/p ventriculostomy 5/25 - s/p coil embolization 5/26; moderate, non flow-limiting aneurysm of anterior communicating artery noted on angiogram - Stable head CT overnight 5/26 - mgmt per neurosurgery, appreciate recs - goal SBP <200 per neurosurgery recs 5/27 (d/c Labetelol 5/25, d/c clevedipine 5/27)   - continue nifedipine for vasospasm prevention - for evaluation of  vasospasm, transcranial doppler every Monday, Wednesday, and Friday - continue AED keppra - continue ancef while shunt remains in place - Cortrak placed 5/27, recs per nutrition - CBC, BMP, Mag, phos  today 5/27   # Episodic Bradycardia - resolved  - pt w/ SAH s/p ventriculostomy pending defintive mgmt w/ multiple, self-resolving  episodes of bradycardia to 20s night of 5/25 w/ otherwise stable vitals, likely 2/2 to [facility] - EKG 5/25 shows sinus bradycardia, LVH, no signs of heart block - D/c labetelol 5/26 for possible contributor to bradycardia - VSS and pulse within normal limits night of 5/26, hypertensive and tachycardic w/ SBP at goals <200, tachy to 110s - consider repeat EKG, echo if bradycardia worsens or becomes symptomatic or hemodynamically unstable  #Tobacco use - stable - Offer tobacco use counseling, NRT as needed pending pt stabilization   Best practice (right click and "Reselect all SmartList Selections" daily)  Diet:  per nutrition, cortrak placed 5/27 Pain/Anxiety/Delirium protocol (if indicated): No VAP protocol (if indicated): Not indicated DVT prophylaxis: SCD GI prophylaxis: PPI Glucose control:  SSI No Central venous access:  N/A Arterial line:  N/A Foley:  N/A Mobility:  bed rest  PT consulted: N/A Last date of multidisciplinary goals of care discussion [per primary] Code Status:  full code Disposition: ICU. Critical care needs resolving - will see as needed, but please don't hesitant to contact if we can be of further assistance.     Labs   CBC: Recent Labs  Lab 05/19/21 1753 05/19/21 2021 05/21/21 1236  WBC 16.4* 19.1* 11.5*  NEUTROABS 14.1*  --   --   HGB 14.9 13.7 12.3*  HCT 44.5 39.3 36.3*  MCV 98.2 96.1 96.5  PLT 335 332 302    Basic Metabolic Panel: Recent Labs  Lab 05/19/21 1753 05/20/21 1133 05/21/21 1236  NA 141  --  144  K 3.6  --  3.2*  CL 107  --  113*  CO2 23  --  23  GLUCOSE 128*  --  88  BUN 18  --  10  CREATININE 0.68  --  0.59*  CALCIUM 9.1  --  8.1*  MG  --  2.5* 2.3  PHOS  --   --  2.8   GFR: Estimated Creatinine Clearance: 89.3 mL/min (A) (by C-G formula based on SCr of 0.59 mg/dL (L)). Recent Labs  Lab 05/19/21 1753 05/19/21 2021 05/21/21 1236  WBC 16.4* 19.1* 11.5*  LATICACIDVEN 1.8  --   --     Liver Function Tests: Recent Labs   Lab 05/19/21 1753  AST 91*  ALT 41  ALKPHOS 50  BILITOT 1.1  PROT 6.8  ALBUMIN 3.5   No results for input(s): LIPASE, AMYLASE in the last 168 hours. No results for input(s): AMMONIA in the last 168 hours.  ABG    Component Value Date/Time   HCO3 25.1 (H) 01/24/2008 2351   TCO2 26 01/24/2008 2351   ACIDBASEDEF 1.0 01/24/2008 2351     Coagulation Profile: Recent Labs  Lab 05/19/21 2021  INR 1.0    Cardiac Enzymes: No results for input(s): CKTOTAL, CKMB, CKMBINDEX, TROPONINI in the last 168 hours.  HbA1C: Hemoglobin A1C  Date/Time Value Ref Range Status  12/14/2013 05:30 AM 5.6 4.2 - 6.3 % Final    Comment:    The American Diabetes Association recommends that a primary goal of therapy should be <7% and that physicians should reevaluate the treatment regimen in patients with HbA1c values consistently >8%.  CBG: Recent Labs  Lab 05/19/21 1752  GLUCAP 132*    Critical care time:    Luther Hearing, MS4  [facility] Holler [place] [facility] 05/21/2021 2:22 PM  Pager: see AMION  If no response to pager , please call critical care on call (see AMION) until 7pm After 7:00 pm call Elink

## 2021-05-21 NOTE — Progress Notes (Signed)
  NEUROSURGERY PROGRESS NOTE   Pt seen and examined. Events overnight reviewed.  EXAM: Temp:  [97 F (36.1 C)-99.4 F (37.4 C)] 99.4 F (37.4 C) (05/27 1200) Pulse Rate:  [58-100] 93 (05/27 1500) Resp:  [8-23] 22 (05/27 1500) BP: (104-165)/(63-94) 165/92 (05/27 1500) SpO2:  [87 %-100 %] 100 % (05/27 1500) Arterial Line BP: (81-189)/(37-106) 189/84 (05/27 1500) Intake/Output      05/26 0701 05/27 0700 05/27 0701 05/28 0700   I.V. (mL/kg) 2451 (38.6) 159.6 (2.5)   NG/GT  47.7   IV Piggyback 250.1 204.5   Total Intake(mL/kg) 2701.1 (42.5) 411.7 (6.5)   Urine (mL/kg/hr) 646 (0.4) 400 (0.7)   Drains 63 70   Other 50    Total Output 759 470   Net +1942.1 -58.3        Urine Occurrence  1 x    Awakens to voice, opens eyes Will answer simple questions with stimulation Oriented to person, not place/year Follows commands, moves purposefully BUE/BLE Groin site c/d/i EVD in place open @ 0, functional with bloody CSF  LABS: Lab Results  Component Value Date   CREATININE 0.59 (L) 05/21/2021   BUN 10 05/21/2021   NA 144 05/21/2021   K 3.2 (L) 05/21/2021   CL 113 (H) 05/21/2021   CO2 23 05/21/2021   Lab Results  Component Value Date   WBC 11.5 (H) 05/21/2021   HGB 12.3 (L) 05/21/2021   HCT 36.3 (L) 05/21/2021   MCV 96.5 05/21/2021   PLT 302 05/21/2021     IMPRESSION: - 60 y.o. male SAH d#2 s/p coiling of Acom aneurysm, angio also demonstrates moderate vasospasm of bilateral distal ICA/A1/M1.  PLAN: - Cont observation - Allow pts SBP to elevate up to - Increase EVD to - Cont Nimotop - TCD - PT/OT eval   Lisbeth Renshaw, MD [facility]

## 2021-05-22 LAB — URINE CULTURE: Culture: 40000 — AB

## 2021-05-22 LAB — GLUCOSE, CAPILLARY
Glucose-Capillary: 143 mg/dL — ABNORMAL HIGH (ref 70–99)
Glucose-Capillary: 161 mg/dL — ABNORMAL HIGH (ref 70–99)
Glucose-Capillary: 138 mg/dL — ABNORMAL HIGH (ref 70–99)
Glucose-Capillary: 146 mg/dL — ABNORMAL HIGH (ref 70–99)
Glucose-Capillary: 137 mg/dL — ABNORMAL HIGH (ref 70–99)
Glucose-Capillary: 133 mg/dL — ABNORMAL HIGH (ref 70–99)

## 2021-05-22 NOTE — Progress Notes (Signed)
Assisted tele visit to patient with family member.  Manuela Halbur R, RN  

## 2021-05-22 NOTE — Progress Notes (Signed)
Subjective: Patient reports somnolent, but arousable  Objective: Vital signs in last 24 hours: Temp:  [98.3 F (36.8 C)-101.6 F (38.7 C)] 100.6 F (38.1 C) (05/28 0800) Pulse Rate:  [71-100] 92 (05/28 0600) Resp:  [0-26] 19 (05/28 0600) BP: (120-171)/(69-102) 145/90 (05/28 0600) SpO2:  [97 %-100 %] 100 % (05/28 0600) Arterial Line BP: (91-199)/(57-109) 125/68 (05/28 0600)  Intake/Output from previous day: 05/27 0701 - 05/28 0700 In: 2501.1 [I.V.:933.3; NG/GT:1087.7; IV Piggyback:480.2] Out: 1158 [Urine:900; Drains:258] Intake/Output this shift: No intake/output data recorded.  Physical Exam: Non verbal.  Will follow commands in right foot with stimulation.  MAE to noxious stim.  Cranial dressing CDI.  Lab Results: Recent Labs    05/19/21 2021 05/21/21 1236  WBC 19.1* 11.5*  HGB 13.7 12.3*  HCT 39.3 36.3*  PLT 332 302   BMET Recent Labs    05/19/21 1753 05/21/21 1236  NA 141 144  K 3.6 3.2*  CL 107 113*  CO2 23 23  GLUCOSE 128* 88  BUN 18 10  CREATININE 0.68 0.59*  CALCIUM 9.1 8.1*    Studies/Results: CT HEAD WO CONTRAST  Result Date: 05/20/2021 CLINICAL DATA:  Subarachnoid hemorrhage, altered mental status, follow-up examination EXAM: CT HEAD WITHOUT CONTRAST TECHNIQUE: Contiguous axial images were obtained from the base of the skull through the vertex without intravenous contrast. COMPARISON:  CTA and head CT 05/19/2021 FINDINGS: Brain: Interval right frontal ventriculostomy has been placed with its tip in the region of the foramina [place]. Intraparenchymal hematoma within the a periventricular left frontal lobe anteriorly with extensive hemorrhage layering upon the corpus callosum, and extending into the ventricles bilaterally is again seen and appears stable since prior examination mild subarachnoid hemorrhage within the para falcine sulci of the vertex, sylvian fissures and within the suprasellar cistern appears stable. Interval coil embolization of an  expected anterior communicating artery aneurysm. Stable borderline ventriculomegaly. No acute infarct. No significant mass effect or midline shift. Cerebellum is unremarkable. Vascular: No asymmetric hyperdense vasculature. Skull: No acute fracture. Sinuses/Orbits: Visualized orbits are unremarkable. The paranasal sinuses are clear. Other: Mastoid air cells and middle ear cavities are clear. IMPRESSION: Stable extensive subarachnoid, intraparenchymal, and intraventricular hemorrhage with extensive hemorrhage laying along the corpus callosum. No significant associated mass effect or midline shift. Stable borderline ventriculomegaly. Right frontal ventriculostomy has been placed in the interval with its tip in expected position in the region of the foramina [place]. Interval coil embolization of expected anterior communicating artery aneurysm. Electronically Signed   By: Helyn Numbers MD   On: 05/20/2021 21:36   IR Transcath/Emboliz  Result Date: 05/20/2021 PROCEDURE: DIAGNOSTIC CEREBRAL ANGIOGRAM COIL EMBOLIZATION OF ANTERIOR COMMUNICATING ARTERY ANEURYSM HISTORY: The patient is a 60 year old man presenting to the hospital after acute mental status change. His initial workup included CT scan demonstrating significant intraventricular hemorrhage and left-sided subarachnoid hemorrhage. CT angiogram was done demonstrating likelihood of an anterior communicating artery aneurysm. Ventriculostomy was placed and the patient's neurologic status did improve overnight. He therefore presents now for diagnostic cerebral angiogram with possible aneurysm embolization. ACCESS: The technical aspects of the procedure as well as its potential risks and benefits were reviewed with the patient's mother. These risks included but were not limited bleeding, infection, allergic reaction, damage to organs or vital structures, stroke, non-diagnostic procedure, and the catastrophic outcomes of heart attack, coma, and death. With an  understanding of these risks, informed consent was obtained and witnessed. The patient was placed in the supine position on the angiography table and the skin  of right groin prepped in the usual sterile fashion. Case was done under general endotracheal anesthesia monitored by the anesthesia service. MEDICATIONS: HEPARIN: 0 Units total. CONTRAST:  cc, Omnipaque 300 FLUOROSCOPY TIME:  FLUOROSCOPY TIME: See IR records TECHNIQUE: CATHETERS AND WIRES 5-French JB-1 catheter 180 cm 0.035" glidewire 6-French NeuronMax guide sheath 6-French Berenstein Select JB-1 catheter 0.058" CatV guidecatheter 150 cm XT 27 microcatheter Synchro 2 select soft microwire Excelsior XT-17 microcatheter COILS USED Target 360 XL 3 mm x 6 cm VESSELS CATHETERIZED Right internal carotid Left internal carotid Left vertebral Right common femoral Right anterior cerebral artery VESSELS STUDIED Right internal carotid, head Left internal carotid, head Left vertebral Right internal carotid artery, head (during embolization) Right internal carotid artery, head (immediate post-embolization) Right internal carotid artery, head (final control) Right common femoral PROCEDURAL NARRATIVE A 5-Fr JB-1 glide catheter was advanced over a 0.035 glidewire into the aortic arch. The above vessels were then sequentially catheterized and cervical / cerebral angiograms taken. After review of images, the catheter was removed without incident. The 5-Fr sheath was then exchanged over the glidewire for an 8-Fr sheath. Under real-time fluoroscopy, the guide sheath was advanced over the select catheter and glidewire into the descending aorta. The select catheter was then advanced into the cervical right internal carotid artery. The guide sheath was then advanced into the proximal cervical right internal carotid artery. The Select catheter was removed and the 058 guide catheter was coaxially introduced over the microcatheter and microwire. The microcatheter was then advanced  under roadmap guidance into the cavernous internal carotid artery. The guide catheter was then tracked into the cavernous segment of the internal carotid artery. The microwire was then used to select the right middle cerebral artery and the 27 microcatheter was advanced into the M1-M2 junction. This allowed tracking of the guide catheter into the supraclinoid internal carotid artery. Fluoro phase run was taken which demonstrated significant contrast stasis within the right carotid distribution suggesting significant catheter induced spasm. I therefore withdrew the guide catheter into the cavernous segment of the internal carotid artery. Another fluoro phase run demonstrated improved flow through the right carotid circulation. At this point the 27 catheter was removed and the 17 catheter was introduced over the same microwire. Initially, the microwire was advanced into the A2 segment which allowed advancement of the microcatheter into the right A1. Microwire was then withdrawn and the aneurysm lumen was catheterized. The above coil was then deployed. Run taken demonstrated occlusion of the dome of the aneurysm, with a small neck residual. There was significant for contrast stasis within the right ICA territory again suggesting significant likely catheter-related spasm. I therefore elected to complete the procedure at this point, with good dome protection of the aneurysm and minimal neck residual. The microcatheter was removed. Immediate post embolization angiogram was taken. The guide catheter and guide sheath were then withdrawn down into the proximal cervical internal carotid artery. Final control angiogram was taken. The guide catheter and guide sheath were then synchronously removed without incident. FINDINGS: Right internal carotid, head: Injection reveals the presence of a widely patent ICA, M1, and A1 segments and their branches. There is an aneurysm projecting towards the left arising from the right A1 A2  junction. This aneurysm has a relatively narrow neck, with a dome measuring approximately 3.0 x 2.6 mm. There is significant narrowing of the supraclinoid internal carotid artery and M1 and A1 segments likely reflecting vasospasm. The parenchymal and venous phases are normal. The venous sinuses are widely patent.  Left internal carotid, head: Injection reveals the presence of a widely patent ICA, A1, and M1 segments and their branches. There is significant narrowing consistent with vasospasm involving the supraclinoid ICA as well as the first segment its of the anterior and middle cerebral arteries. No left-sided carotid aneurysms are seen. The parenchymal and venous phases are normal. The venous sinuses are widely patent. Left vertebral: Injection reveals the presence of a widely patent vertebral artery. This leads to a widely patent basilar artery that terminates in bilateral P1. The basilar apex is normal. No aneurysms, AVMs, or high-flow fistulas are seen. There is no significant vasospasm of the basilar artery or posterior circulation. The parenchymal and venous phases are normal. The venous sinuses are widely patent. Right internal carotid artery, head (during embolization): Injection reveals the presence of a widely patent ICA that leads to a patent ACA and MCA, although there is significant vasospasm involving the right A1 and M1. Coil mass within the aneurysm is stable, without coil prolapse or filling defect to suggest thrombus. Aneurysm appears to be occluded. Right internal carotid artery, head (immediate post-embolization): Injection reveals the presence of a widely patent ICA that leads to a patent ACA and MCA. Coil mass within the aneurysm is stable, without coil prolapse or filling defect to suggest thrombus. No aneurysm filling is seen. There remains significant vasospasm involving the supraclinoid internal carotid artery as well as the A1 and M1 segments. Right internal carotid artery, head (final  control): Injection reveals the presence of a widely patent ICA that leads to a patent ACA and MCA. No thrombus is visualized. Coil mass is seen within the aneurysm and is in stable position. There is continued spasm involving the supraclinoid internal carotid artery, A1, and M1 segments. Capillary phase however does not demonstrate any perfusion deficits. Venous sinuses are patent. Right femoral: Normal vessel. No significant atherosclerotic disease. Arterial sheath in adequate position. DISPOSITION: Upon completion of the study, the femoral sheath was removed and hemostasis obtained using a 7-Fr ExoSeal closure device. Good proximal and distal lower extremity pulses were documented upon achievement of hemostasis. The procedure was well tolerated and no early complications were observed. The patient was transferred to the postanesthesia care unit in stable hemodynamic condition. IMPRESSION: 1. 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 2. There is moderate vasospasm involving the bilateral supraclinoid internal carotid arteries, and bilateral A1 and M1 segments which does not appear to be flow limiting. The preliminary results of this procedure were shared with the patient's family. 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 His initial workup included CT scan demonstrating significant intraventricular hemorrhage and left-sided subarachnoid hemorrhage. CT angiogram was done demonstrating likelihood of an anterior communicating artery aneurysm. Ventriculostomy was placed and the patient's neurologic status did improve overnight. He therefore presents now for diagnostic cerebral angiogram with  possible aneurysm embolization. ACCESS: The technical aspects of the procedure as well as its potential risks and benefits were reviewed with the patient's mother. These risks included but were not limited bleeding, infection, allergic reaction, damage to organs or vital structures, stroke, non-diagnostic procedure, and the catastrophic outcomes of heart attack, coma, and death. With an understanding of these risks, informed consent was obtained and witnessed. The  patient was placed in the supine position on the angiography table and the skin of right groin prepped in the usual sterile fashion. Case was done under general endotracheal anesthesia monitored by the anesthesia service. MEDICATIONS: HEPARIN: 0 Units total. CONTRAST:  cc, Omnipaque 300 FLUOROSCOPY TIME:  FLUOROSCOPY TIME: See IR records TECHNIQUE: CATHETERS AND WIRES 5-French JB-1 catheter 180 cm 0.035" glidewire 6-French NeuronMax guide sheath 6-French Berenstein Select JB-1 catheter 0.058" CatV guidecatheter 150 cm XT 27 microcatheter Synchro 2 select soft microwire Excelsior XT-17 microcatheter COILS USED Target 360 XL 3 mm x 6 cm VESSELS CATHETERIZED Right internal carotid Left internal carotid Left vertebral Right common femoral Right anterior cerebral artery VESSELS STUDIED Right internal carotid, head Left internal carotid, head Left vertebral Right internal carotid artery, head (during embolization) Right internal carotid artery, head (immediate post-embolization) Right internal carotid artery, head (final control) Right common femoral PROCEDURAL NARRATIVE A 5-Fr JB-1 glide catheter was advanced over a 0.035 glidewire into the aortic arch. The above vessels were then sequentially catheterized and cervical / cerebral angiograms taken. After review of images, the catheter was removed without incident. The 5-Fr sheath was then exchanged over the glidewire for an 8-Fr sheath. Under real-time fluoroscopy, the guide sheath was advanced over the select  catheter and glidewire into the descending aorta. The select catheter was then advanced into the cervical right internal carotid artery. The guide sheath was then advanced into the proximal cervical right internal carotid artery. The Select catheter was removed and the 058 guide catheter was coaxially introduced over the microcatheter and microwire. The microcatheter was then advanced under roadmap guidance into the cavernous internal carotid artery. The guide catheter was then tracked into the cavernous segment of the internal carotid artery. The microwire was then used to select the right middle cerebral artery and the 27 microcatheter was advanced into the M1-M2 junction. This allowed tracking of the guide catheter into the supraclinoid internal carotid artery. Fluoro phase run was taken which demonstrated significant contrast stasis within the right carotid distribution suggesting significant catheter induced spasm. I therefore withdrew the guide catheter into the cavernous segment of the internal carotid artery. Another fluoro phase run demonstrated improved flow through the right carotid circulation. At this point the 27 catheter was removed and the 17 catheter was introduced over the same microwire. Initially, the microwire was advanced into the A2 segment which allowed advancement of the microcatheter into the right A1. Microwire was then withdrawn and the aneurysm lumen was catheterized. The above coil was then deployed. Run taken demonstrated occlusion of the dome of the aneurysm, with a small neck residual. There was significant for contrast stasis within the right ICA territory again suggesting significant likely catheter-related spasm. I therefore elected to complete the procedure at this point, with good dome protection of the aneurysm and minimal neck residual. The microcatheter was removed. Immediate post embolization angiogram was taken. The guide catheter and guide sheath were then withdrawn down  into the proximal cervical internal carotid artery. Final control angiogram was taken. The guide catheter and guide sheath were then synchronously removed without incident. FINDINGS: Right internal carotid, head: Injection reveals the presence of a widely patent ICA, M1, and A1 segments and their branches. There is an aneurysm projecting towards the left arising from the right A1 A2 junction. This aneurysm has a relatively narrow neck, with a dome measuring approximately 3.0 x 2.6 mm. There is significant narrowing of the supraclinoid internal carotid artery and M1 and A1 segments likely reflecting  vasospasm. The parenchymal and venous phases are normal. The venous sinuses are widely patent. Left internal carotid, head: Injection reveals the presence of a widely patent ICA, A1, and M1 segments and their branches. There is significant narrowing consistent with vasospasm involving the supraclinoid ICA as well as the first segment its of the anterior and middle cerebral arteries. No left-sided carotid aneurysms are seen. The parenchymal and venous phases are normal. The venous sinuses are widely patent. Left vertebral: Injection reveals the presence of a widely patent vertebral artery. This leads to a widely patent basilar artery that terminates in bilateral P1. The basilar apex is normal. No aneurysms, AVMs, or high-flow fistulas are seen. There is no significant vasospasm of the basilar artery or posterior circulation. The parenchymal and venous phases are normal. The venous sinuses are widely patent. Right internal carotid artery, head (during embolization): Injection reveals the presence of a widely patent ICA that leads to a patent ACA and MCA, although there is significant vasospasm involving the right A1 and M1. Coil mass within the aneurysm is stable, without coil prolapse or filling defect to suggest thrombus. Aneurysm appears to be occluded. Right internal carotid artery, head (immediate post-embolization):  Injection reveals the presence of a widely patent ICA that leads to a patent ACA and MCA. Coil mass within the aneurysm is stable, without coil prolapse or filling defect to suggest thrombus. No aneurysm filling is seen. There remains significant vasospasm involving the supraclinoid internal carotid artery as well as the A1 and M1 segments. Right internal carotid artery, head (final control): Injection reveals the presence of a widely patent ICA that leads to a patent ACA and MCA. No thrombus is visualized. Coil mass is seen within the aneurysm and is in stable position. There is continued spasm involving the supraclinoid internal carotid artery, A1, and M1 segments. Capillary phase however does not demonstrate any perfusion deficits. Venous sinuses are patent. Right femoral: Normal vessel. No significant atherosclerotic disease. Arterial sheath in adequate position. DISPOSITION: Upon completion of the study, the femoral sheath was removed and hemostasis obtained using a 7-Fr ExoSeal closure device. Good proximal and distal lower extremity pulses were documented upon achievement of hemostasis. The procedure was well tolerated and no early complications were observed. The patient was transferred to the postanesthesia care unit in stable hemodynamic condition. IMPRESSION: 1. 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 2. There is moderate vasospasm involving the bilateral supraclinoid internal carotid arteries, and bilateral A1 and M1 segments which does not appear to be flow limiting. The preliminary results of this procedure were shared with the patient's family. Electronically Signed   By: Lisbeth Renshaw   On: 05/20/2021 16:23   DG Abd Portable 1V  Result Date: 05/21/2021 CLINICAL DATA:  Feeding tube placement. EXAM: PORTABLE ABDOMEN - 1 VIEW COMPARISON:  None. FINDINGS: Bowel gas pattern is normal. Small bore feeding tube terminates in the distal stomach. Lung bases are clear.  IMPRESSION: Small bore feeding tube terminates in the distal stomach. Electronically Signed   By: Marin Roberts M.D.   On: 05/21/2021 12:22   VAS [place] TRANSCRANIAL DOPPLER  Result Date: 05/21/2021  Transcranial Doppler Patient Name:  JAXSON ANGLIN  Date of Exam:   05/21/2021 Medical Rec #: [id number]         Accession #:    [id number] Date of Birth: 1961/08/19         Patient Gender: M Patient Age:   60Y Exam Location[facility] Procedure:      VAS [place] TRANSCRANIAL DOPPLER Referring Phys: [id number] GARY CRAM --------------------------------------------------------------------------------  Indications: Subarachnoid hemorrhage. History: History of drug and alcohol abuse. No other significant history. Comparison Study: No prior study Performing Technologist: Gertie Fey MHA, RDMS, RVT, RDCS  Examination Guidelines: A complete evaluation includes B-mode imaging, spectral Doppler, color Doppler, and power Doppler as needed of all accessible portions of each vessel. Bilateral testing is considered an integral part of a complete examination. Limited examinations for reoccurring indications may be performed as noted.  +----------+-------------+----------+-----------+------------------+ RIGHT TCD Right VM (cm)Depth (cm)Pulsatility     Comment       +----------+-------------+----------+-----------+------------------+ MCA          154.00       5.00      0.73                       +----------+-------------+----------+-----------+------------------+ ACA          -54.00                 0.74                       +----------+-------------+----------+-----------+------------------+ Term ICA     129.00       5.60      0.74                       +----------+-------------+----------+-----------+------------------+ PCA           21.00                 1.31                       +----------+-------------+----------+-----------+------------------+ Opthalmic     28.00                  1.11                       +----------+-------------+----------+-----------+------------------+ ICA siphon                                  Unable to insonate +----------+-------------+----------+-----------+------------------+ Vertebral    -21.00                 0.64                       +----------+-------------+----------+-----------+------------------+ Distal ICA    26.00                                            +----------+-------------+----------+-----------+------------------+  +----------+------------+----------+-----------+-------+ LEFT TCD  Left VM (cm)Depth (cm)PulsatilityComment +----------+------------+----------+-----------+-------+ MCA          158.00      5.10      0.64            +----------+------------+----------+-----------+-------+ ACA          -37.00                0.59            +----------+------------+----------+-----------+-------+ Term ICA     29.00                 0.75            +----------+------------+----------+-----------+-------+  PCA          25.00                 1.03            +----------+------------+----------+-----------+-------+ Opthalmic    21.00                 1.36            +----------+------------+----------+-----------+-------+ Vertebral    -28.00                0.94            +----------+------------+----------+-----------+-------+ Distal ICA   27.00                                 +----------+------------+----------+-----------+-------+  +------------+-------+------------------+             VM cm/s     Comment       +------------+-------+------------------+ Prox Basilar-65.00                    +------------+-------+------------------+ Dist Basilar       Unable to insonate +------------+-------+------------------+ +----------------------+---+ Right [place]ard Ratio5.9 +----------------------+---+ +---------------------+---+ Left [place]ard Ratio5.9 +---------------------+---+     Preliminary     Assessment/Plan: Patient is stable.  Continue EVD at 5 cm.  Allow SBPP to 200.  Continue supportive care.    LOS: 3 days    Dorian Heckle, MD 05/22/2021, 8:58 AM

## 2021-05-23 ENCOUNTER — Inpatient Hospital Stay: Payer: Self-pay

## 2021-05-23 ENCOUNTER — Inpatient Hospital Stay (HOSPITAL_COMMUNITY): Payer: 59

## 2021-05-23 DIAGNOSIS — R4182 Altered mental status, unspecified: Secondary | ICD-10-CM | POA: Diagnosis not present

## 2021-05-23 DIAGNOSIS — R502 Drug induced fever: Secondary | ICD-10-CM | POA: Diagnosis not present

## 2021-05-23 DIAGNOSIS — I608 Other nontraumatic subarachnoid hemorrhage: Secondary | ICD-10-CM

## 2021-05-23 DIAGNOSIS — I609 Nontraumatic subarachnoid hemorrhage, unspecified: Secondary | ICD-10-CM | POA: Diagnosis not present

## 2021-05-23 LAB — CBC
HCT: 36.5 % — ABNORMAL LOW (ref 39.0–52.0)
Hemoglobin: 12.2 g/dL — ABNORMAL LOW (ref 13.0–17.0)
MCH: 32.6 pg (ref 26.0–34.0)
MCHC: 33.4 g/dL (ref 30.0–36.0)
MCV: 97.6 fL (ref 80.0–100.0)
Platelets: 245 10*3/uL (ref 150–400)
RBC: 3.74 MIL/uL — ABNORMAL LOW (ref 4.22–5.81)
RDW: 12.6 % (ref 11.5–15.5)
WBC: 9.8 10*3/uL (ref 4.0–10.5)
nRBC: 0 % (ref 0.0–0.2)

## 2021-05-23 LAB — GLUCOSE, CAPILLARY
Glucose-Capillary: 171 mg/dL — ABNORMAL HIGH (ref 70–99)
Glucose-Capillary: 154 mg/dL — ABNORMAL HIGH (ref 70–99)
Glucose-Capillary: 141 mg/dL — ABNORMAL HIGH (ref 70–99)
Glucose-Capillary: 182 mg/dL — ABNORMAL HIGH (ref 70–99)
Glucose-Capillary: 62 mg/dL — ABNORMAL LOW (ref 70–99)
Glucose-Capillary: 161 mg/dL — ABNORMAL HIGH (ref 70–99)
Glucose-Capillary: 157 mg/dL — ABNORMAL HIGH (ref 70–99)

## 2021-05-23 LAB — PROTIME-INR
INR: 1.1 (ref 0.8–1.2)
Prothrombin Time: 13.7 seconds (ref 11.4–15.2)

## 2021-05-23 LAB — ECHOCARDIOGRAM LIMITED
Area-P 1/2: 3.3 cm2
Height: 69 in
S' Lateral: 2.9 cm
Weight: 2261.04 oz

## 2021-05-23 LAB — COMPREHENSIVE METABOLIC PANEL
ALT: 42 U/L (ref 0–44)
AST: 49 U/L — ABNORMAL HIGH (ref 15–41)
Albumin: 2.6 g/dL — ABNORMAL LOW (ref 3.5–5.0)
Alkaline Phosphatase: 38 U/L (ref 38–126)
Anion gap: 6 (ref 5–15)
BUN: 10 mg/dL (ref 6–20)
CO2: 25 mmol/L (ref 22–32)
Calcium: 8.2 mg/dL — ABNORMAL LOW (ref 8.9–10.3)
Chloride: 109 mmol/L (ref 98–111)
Creatinine, Ser: 0.5 mg/dL — ABNORMAL LOW (ref 0.61–1.24)
GFR, Estimated: >60 mL/min (ref >60)
Glucose, Bld: 141 mg/dL — ABNORMAL HIGH (ref 70–99)
Potassium: 3.6 mmol/L (ref 3.5–5.1)
Sodium: 140 mmol/L (ref 135–145)
Total Bilirubin: 0.2 mg/dL — ABNORMAL LOW (ref 0.3–1.2)
Total Protein: 5.4 g/dL — ABNORMAL LOW (ref 6.5–8.1)

## 2021-05-23 LAB — AMMONIA: Ammonia: 64 umol/L — ABNORMAL HIGH (ref 9–35)

## 2021-05-23 LAB — APTT: aPTT: 28 seconds (ref 24–36)

## 2021-05-23 MED ORDER — LEVETIRACETAM 100 MG/ML PO SOLN
500.0000 mg | Freq: Two times a day (BID) | ORAL | Status: DC
  Administered 2021-05-23 – 2021-05-30 (×15): 500 mg
  Filled 2021-05-23 (×15): qty 5

## 2021-05-23 MED ORDER — NOREPINEPHRINE 4 MG/250ML-% IV SOLN
INTRAVENOUS | Status: AC
  Filled 2021-05-23: qty 250

## 2021-05-23 MED ORDER — DEXTROSE 50 % IV SOLN: 12.5000 g | INTRAVENOUS | Status: AC

## 2021-05-23 MED ORDER — HEPARIN SODIUM (PORCINE) 5000 UNIT/ML IJ SOLN
5000.0000 [IU] | Freq: Three times a day (TID) | INTRAMUSCULAR | Status: DC
  Administered 2021-05-23 – 2021-06-14 (×67): 5000 [IU] via SUBCUTANEOUS
  Filled 2021-05-23 (×66): qty 1

## 2021-05-23 MED ORDER — IOHEXOL 350 MG/ML SOLN
51.0000 mL | Freq: Once | INTRAVENOUS | Status: AC | PRN
  Administered 2021-05-23: 51 mL via INTRAVENOUS

## 2021-05-23 MED ORDER — DEXTROSE 50 % IV SOLN
INTRAVENOUS | Status: AC
  Administered 2021-05-23: 12.5 g via INTRAVENOUS
  Filled 2021-05-23: qty 50

## 2021-05-23 MED ORDER — NOREPINEPHRINE 4 MG/250ML-% IV SOLN
0.0000 ug/min | INTRAVENOUS | Status: DC
  Administered 2021-05-23: 10 ug/min via INTRAVENOUS
  Administered 2021-05-23: 2 ug/min via INTRAVENOUS
  Administered 2021-05-24: 8 ug/min via INTRAVENOUS
  Administered 2021-05-24 – 2021-05-25 (×3): 10 ug/min via INTRAVENOUS
  Administered 2021-05-25: 6 ug/min via INTRAVENOUS
  Administered 2021-05-25: 10 ug/min via INTRAVENOUS
  Filled 2021-05-23 (×8): qty 250

## 2021-05-23 MED ORDER — PHENYLEPHRINE HCL-NACL 10-0.9 MG/250ML-% IV SOLN
0.0000 ug/min | INTRAVENOUS | Status: DC
  Administered 2021-05-23: 180 ug/min via INTRAVENOUS
  Administered 2021-05-23: 200 ug/min via INTRAVENOUS
  Administered 2021-05-23: 120 ug/min via INTRAVENOUS
  Administered 2021-05-23: 20 ug/min via INTRAVENOUS
  Administered 2021-05-23 (×3): 200 ug/min via INTRAVENOUS
  Administered 2021-05-23: 150 ug/min via INTRAVENOUS
  Administered 2021-05-23: 100 ug/min via INTRAVENOUS
  Administered 2021-05-24: 150 ug/min via INTRAVENOUS
  Administered 2021-05-24: 120 ug/min via INTRAVENOUS
  Administered 2021-05-24: 13.333 ug/min via INTRAVENOUS
  Administered 2021-05-24: 140 ug/min via INTRAVENOUS
  Administered 2021-05-24: 120 ug/min via INTRAVENOUS
  Administered 2021-05-24: 140 ug/min via INTRAVENOUS
  Administered 2021-05-24: 150 ug/min via INTRAVENOUS
  Administered 2021-05-24: 160 ug/min via INTRAVENOUS
  Administered 2021-05-24: 180 ug/min via INTRAVENOUS
  Administered 2021-05-24: 130 ug/min via INTRAVENOUS
  Administered 2021-05-24: 120 ug/min via INTRAVENOUS
  Administered 2021-05-24: 180 ug/min via INTRAVENOUS
  Administered 2021-05-24: 120 ug/min via INTRAVENOUS
  Administered 2021-05-24: 150 ug/min via INTRAVENOUS
  Administered 2021-05-24: 200 ug/min via INTRAVENOUS
  Administered 2021-05-24: 180 ug/min via INTRAVENOUS
  Administered 2021-05-24: 130 ug/min via INTRAVENOUS
  Administered 2021-05-24: 160 ug/min via INTRAVENOUS
  Administered 2021-05-24: 200 ug/min via INTRAVENOUS
  Administered 2021-05-25: 160 ug/min via INTRAVENOUS
  Administered 2021-05-25 (×6): 180 ug/min via INTRAVENOUS
  Filled 2021-05-23: qty 250
  Filled 2021-05-23: qty 500
  Filled 2021-05-23 (×6): qty 250
  Filled 2021-05-23: qty 500
  Filled 2021-05-23: qty 250
  Filled 2021-05-23: qty 500
  Filled 2021-05-23: qty 1250
  Filled 2021-05-23 (×3): qty 250
  Filled 2021-05-23: qty 750
  Filled 2021-05-23: qty 1250
  Filled 2021-05-23: qty 500
  Filled 2021-05-23: qty 250
  Filled 2021-05-23: qty 500
  Filled 2021-05-23: qty 250
  Filled 2021-05-23 (×2): qty 750

## 2021-05-23 NOTE — Progress Notes (Signed)
NAME:  [name] [name], MRN:  [id number], DOB:  [birth year], LOS: 4 ADMISSION DATE:  05/19/2021, CONSULTATION DATE:  05/20/21 REFERRING MD:  Lisbeth Renshaw CHIEF COMPLAINT:  SAH  History of Present Illness:  Pt is a 89 you w/ PMH sinusitis, rotator cuff tear, tobacco, marijuana, and ETOH use, remote hx of crack cocaine use who is being managed for intraventricular hemorrhage w/ large acute SAH now s/p ventriculostomy.  Pt notable seen in ED for frontal headache and had negative heat CT at that time. Pt spoke with family on Monday. Wednesday did not come to work prompting call to EMS; EMS found minimally responsive at home. PT's mother denies knowledge of any recent trauma.   CT head on admission SAH and CTA showed 3 mm aneurysm from anterior communicating artery thought to have ruptured and caused hemorrhage. Ventriculostomy done at bedside in ED.    Pertinent  Medical History  Sinusitis Tobacco, ETOH, and marijuana use Remote history cocaine use Rotator cuff tear Left toe amputation   Significant Hospital Events: Including procedures, antibiotic start and stop dates in addition to other pertinent events   . 5/25 ventriculostomy . 5/26 coil embolization . Ancef 5/25 ->  Interim History / Subjective:  Called back by neurosurgery team with worsening encephalopathy overnight, concern for vasospasm. Now on pressors neosynephrine peripherally.   Objective   Blood pressure (!) 151/92, pulse 81, temperature (!) 101.7 F (38.7 C), temperature source Axillary, resp. rate 10, height 5\' 9"  (1.753 m), weight 64.1 kg, SpO2 100 %.        Intake/Output Summary (Last 24 hours) at 05/23/2021 1156 Last data filed at 05/23/2021 1100 Gross per 24 hour  Intake 3661.39 ml  Output 2408 ml  Net 1253.39 ml   Filed Weights   05/20/21 0600 05/20/21 1211 05/23/21 0400  Weight: 63.5 kg 63.5 kg 64.1 kg    Examination: General: NAD, lying in hospital bed with eyes open HENT: pupils equal and  round, and reactive to light bilaterally. Does not track movement w/ eyes bilaterally.  Lungs: Normal WOB on RA Cardiovascular: RRR, no m/r/g Abdomen: soft, normoactive bowel sounds Extremities: warm and well perfused in all 4 extremities, shortened left first toe w/ no toenail Neuro: on my exam did not open eyes to sternal rub, per nursing opens eyes spontaneously and moves all 4 extremites  Labs/imaging that I have personally reviewed  (right click and "Reselect all SmartList Selections" daily)   Resolved Hospital Problem list    Na 140 K 3.6 Cr 0.5 WBC 9.8 Hgb 12.2  Assessment & Plan:   #Subarrachnoid, Intraparenchymal, and Intraventricular Hemorrhage - IVH and SAH on CT 5/25; 3 mm ACA aneurysm on CTA, suspected ruptured and cause of hemorrhage - s/p ventriculostomy 5/25 - s/p coil embolization 5/26; moderate, non flow-limiting aneurysm of anterior communicating artery noted on angiogram - Stable head CT overnight 5/26 - goal SBP 180-120per neurosurgery recs - starting vasopressors with neosynephrine and norepinephrine to achieve this. May need central line. Plan for repeat CTA if no improvement in mental status with pressors. - for evaluation of vasospasm, transcranial doppler every Monday, Wednesday, and Friday - continue AED keppra - continue ancef while shunt remains in place - Cortrak placed 5/27, recs per nutrition  #Persistent fevers On empiric ancef while shunt is in place.  quotidien fevers may be concerning for drug fever on a beta lactam - no leukocytosis or hypotension -would send blood cultures if he fevers again  # Episodic Bradycardia - resolved  -  pt w/ SAH s/p ventriculostomy pending defintive mgmt w/ multiple, self-resolving episodes of bradycardia to 20s night of 5/25 w/ otherwise stable vitals, likely 2/2 to [facility] - EKG 5/25 shows sinus bradycardia, LVH, no signs of heart block   #Tobacco use - stable - Offer tobacco use counseling, NRT as needed  pending pt stabilization   Best practice (right click and "Reselect all SmartList Selections" daily)  Diet:  per nutrition, cortrak placed 5/27 Pain/Anxiety/Delirium protocol (if indicated): No VAP protocol (if indicated): Not indicated DVT prophylaxis: SCD GI prophylaxis: PPI Glucose control:  SSI No Central venous access:  N/A Arterial line:  N/A Foley:  N/A Mobility:  bed rest  PT consulted: N/A Last date of multidisciplinary goals of care discussion [per primary] Code Status:  full code Disposition: ICU  Labs   CBC: Recent Labs  Lab 05/19/21 1753 05/19/21 2021 05/21/21 1236 05/23/21 1032  WBC 16.4* 19.1* 11.5* 9.8  NEUTROABS 14.1*  --   --   --   HGB 14.9 13.7 12.3* 12.2*  HCT 44.5 39.3 36.3* 36.5*  MCV 98.2 96.1 96.5 97.6  PLT 335 332 302 245    Basic Metabolic Panel: Recent Labs  Lab 05/19/21 1753 05/20/21 1133 05/21/21 1236 05/23/21 1032  NA 141  --  144 140  K 3.6  --  3.2* 3.6  CL 107  --  113* 109  CO2 23  --  23 25  GLUCOSE 128*  --  88 141*  BUN 18  --  10 10  CREATININE 0.68  --  0.59* 0.50*  CALCIUM 9.1  --  8.1* 8.2*  MG  --  2.5* 2.3  --   PHOS  --   --  2.8  --    GFR: Estimated Creatinine Clearance: 90.1 mL/min (A) (by C-G formula based on SCr of 0.5 mg/dL (L)). Recent Labs  Lab 05/19/21 1753 05/19/21 2021 05/21/21 1236 05/23/21 1032  WBC 16.4* 19.1* 11.5* 9.8  LATICACIDVEN 1.8  --   --   --     Liver Function Tests: Recent Labs  Lab 05/19/21 1753 05/23/21 1032  AST 91* 49*  ALT 41 42  ALKPHOS 50 38  BILITOT 1.1 0.2*  PROT 6.8 5.4*  ALBUMIN 3.5 2.6*   No results for input(s): LIPASE, AMYLASE in the last 168 hours. Recent Labs  Lab 05/23/21 1032  AMMONIA 64*    ABG    Component Value Date/Time   HCO3 25.1 (H) 01/24/2008 2351   TCO2 26 01/24/2008 2351   ACIDBASEDEF 1.0 01/24/2008 2351     Coagulation Profile: Recent Labs  Lab 05/19/21 2021 05/23/21 1032  INR 1.0 1.1    Cardiac Enzymes: No results for  input(s): CKTOTAL, CKMB, CKMBINDEX, TROPONINI in the last 168 hours.  HbA1C: Hemoglobin A1C  Date/Time Value Ref Range Status  12/14/2013 05:30 AM 5.6 4.2 - 6.3 % Final    Comment:    The American Diabetes Association recommends that a primary goal of therapy should be <7% and that physicians should reevaluate the treatment regimen in patients with HbA1c values consistently >8%.     CBG: Recent Labs  Lab 05/22/21 1940 05/22/21 2318 05/23/21 0330 05/23/21 0819 05/23/21 1135  GLUCAP 137* 133* 171* 154* 141*    Critical care time:    The patient is critically ill due to shock, vasospasm, ICH.  Critical care was necessary to treat or prevent imminent or life-threatening deterioration.  Critical care was time spent personally by me on the following  activities: development of treatment plan with patient and/or surrogate as well as nursing, discussions with consultants, evaluation of patient's response to treatment, examination of patient, obtaining history from patient or surrogate, ordering and performing treatments and interventions, ordering and review of laboratory studies, ordering and review of radiographic studies, pulse oximetry, re-evaluation of patient's condition and participation in multidisciplinary rounds.   Critical Care Time devoted to patient care services described in this note is 35 minutes. This time reflects time of care of this signee Charlott Holler . This critical care time does not reflect separately billable procedures or procedure time, teaching time or supervisory time of PA/NP/Med student/Med Resident etc but could involve care discussion time.       Charlott Holler [place] [facility] 05/23/2021 2:04 PM  Pager: see AMION  If no response to pager , please call critical care on call (see AMION) until 7pm After 7:00 pm call Elink

## 2021-05-23 NOTE — Progress Notes (Signed)
Patient transported to and from CT without complications. VSS.

## 2021-05-23 NOTE — Progress Notes (Addendum)
Neurosurgery Service Progress Note  Subjective: No acute events overnight   Objective: Vitals:   05/23/21 0630 05/23/21 0700 05/23/21 0800 05/23/21 0900  BP: (!) 162/101 (!) 142/75 (!) 153/94 135/73  Pulse: 91 (!) 101 86 92  Resp: (!) 23 19 17 20   Temp:   (!) 101.7 F (38.7 C)   TempSrc:   Axillary   SpO2: 100% 100% 100% 98%  Weight:      Height:        Physical Exam: Eyes open to pain only, PERRL, mild left gaze preference, nonverbal, does not FC or regard, moving BUE occasionally to stimulus, wiggles toes on R but not left to painful stimulus / cues EVD patent, draining bloody CSF @ +5  Assessment & Plan: 60 y.o. man w/ ruptured AComm, day 4 now but had spasm on initial treatment angiogram so question delayed presentation, but he's in the spasm window. Post-op CTH w/ some L frontal hemorrhage from rupture, but lower extremity exam worse on the left and becoming more somnolent despite normal ICPs, concerning for possible symptomatic vasospasm.  -will raise SBP to 180 - 200 to see if his exam improves, if not then repeat CTA -encephalopathy labs -getting TF, FWF, NS @ 100, good UOP -pt has L distal toe amp, EMR w/ h/o prior crack cocaine use and possible ethanol abuse, will have to watch his cardiac function closely with HHH. Clinically euvolemic at this time, will hold off on bolusing right now to push too far out on his curve. -SCDs/TEDs, SQH  Sidi Dzikowski A Kristian Hazzard  05/23/21 10:08 AM

## 2021-05-23 NOTE — Progress Notes (Signed)
Telephone conversation with Blane Ohara .  Secure chat with Blane Ohara , Dr Maurice Small and DrDesai re febrile with Tmax 102.5 axillary and concerns re PICC placement at this time.  Patient with adequate IV access at this time per RN.  WIll hold on PICC placement at this time.

## 2021-05-23 NOTE — Progress Notes (Signed)
  Echocardiogram 2D Echocardiogram has been performed.  [name] [name] 05/23/2021, 11:11 AM

## 2021-05-23 NOTE — Progress Notes (Addendum)
1600 CBG was 62. 12.5 g dextrose given IV. Patient showing no obvious symptoms of hypoglycemia.  Will recheck sugar in 15 minutes.   Repeat CBG 182.

## 2021-05-23 NOTE — Progress Notes (Signed)
I called CT regarding patient's STAT CTA. They are sending transport.

## 2021-05-24 ENCOUNTER — Inpatient Hospital Stay (HOSPITAL_COMMUNITY): Payer: 59

## 2021-05-24 DIAGNOSIS — R4182 Altered mental status, unspecified: Secondary | ICD-10-CM | POA: Diagnosis not present

## 2021-05-24 DIAGNOSIS — I609 Nontraumatic subarachnoid hemorrhage, unspecified: Secondary | ICD-10-CM

## 2021-05-24 LAB — GLUCOSE, CAPILLARY
Glucose-Capillary: 145 mg/dL — ABNORMAL HIGH (ref 70–99)
Glucose-Capillary: 147 mg/dL — ABNORMAL HIGH (ref 70–99)
Glucose-Capillary: 170 mg/dL — ABNORMAL HIGH (ref 70–99)
Glucose-Capillary: 150 mg/dL — ABNORMAL HIGH (ref 70–99)
Glucose-Capillary: 132 mg/dL — ABNORMAL HIGH (ref 70–99)
Glucose-Capillary: 147 mg/dL — ABNORMAL HIGH (ref 70–99)

## 2021-05-24 MED ORDER — CHLORHEXIDINE GLUCONATE 0.12 % MT SOLN
15.0000 mL | Freq: Two times a day (BID) | OROMUCOSAL | Status: DC
  Administered 2021-05-24 – 2021-07-05 (×85): 15 mL via OROMUCOSAL
  Filled 2021-05-24 (×63): qty 15

## 2021-05-24 MED ORDER — ORAL CARE MOUTH RINSE
15.0000 mL | Freq: Two times a day (BID) | OROMUCOSAL | Status: DC
  Administered 2021-05-24 – 2021-07-05 (×83): 15 mL via OROMUCOSAL

## 2021-05-24 MED ORDER — LACTULOSE 10 GM/15ML PO SOLN
10.0000 g | Freq: Three times a day (TID) | ORAL | Status: DC
  Administered 2021-05-24 (×2): 10 g
  Filled 2021-05-24 (×2): qty 15

## 2021-05-24 NOTE — Progress Notes (Signed)
Secure chat with Dr Lynnell Catalan re: PICC. Patient high temperature is due to hemorrhage centrally as per MD Ok to insert PICC line tomorrow 5/31.

## 2021-05-24 NOTE — Progress Notes (Signed)
Transcranial Doppler  Date POD PCO2 HCT BP  MCA ACA PCA OPHT SIPH VERT Basilar  5/27 MS     Right  Left   154  158   -54  -37   21  25   28  21    *  *   -21  -28     -65    5/30 MS     Right  Left   135  142   -38  -148   46  20   17  25   21   47   *  *   *           Right  Left                                             Right  Left                                             Right  Left                                            Right  Left                                            Right  Left                                        MCA = Middle Cerebral Artery      OPHT = Opthalmic Artery     BASILAR = Basilar Artery   ACA = Anterior Cerebral Artery     SIPH = Carotid Siphon PCA = Posterior Cerebral Artery   VERT = Verterbral Artery                   Normal MCA = 62+\-12 ACA = 50+\-12 PCA = 42+\-23  *Unable to insonate  05/21/21- Right Lindegaard ratio= 5.9, Left Lindegaard ratio= 5.9. Preliminary results discussed with , RN. 05/24/21- Right Lindegaard ratio=4.66, Left Lindegaard ratio= 4.44.  05/24/2021 11:04 AM 05/26/21., MHA, RVT, RDCS, RDMS

## 2021-05-24 NOTE — Progress Notes (Signed)
NAME:  [name] [name], MRN:  [id number], DOB:  1961/04/14, LOS: 5 ADMISSION DATE:  05/19/2021, CONSULTATION DATE:  05/20/21 REFERRING MD:  Lisbeth Renshaw CHIEF COMPLAINT:  SAH  History of Present Illness:  Pt is a 32 you w/ PMH sinusitis, rotator cuff tear, tobacco, marijuana, and ETOH use, remote hx of crack cocaine use who is being managed for intraventricular hemorrhage w/ large acute SAH now s/p ventriculostomy.  Pt notable seen in ED for frontal headache and had negative heat CT at that time. Pt spoke with family on Monday. Wednesday did not come to work prompting call to EMS; EMS found minimally responsive at home. PT's mother denies knowledge of any recent trauma.   CT head on admission SAH and CTA showed 3 mm aneurysm from anterior communicating artery thought to have ruptured and caused hemorrhage. Ventriculostomy done at bedside in ED.    Pertinent  Medical History  Sinusitis Tobacco, ETOH, and marijuana use Remote history cocaine use Rotator cuff tear Left toe amputation   Significant Hospital Events: Including procedures, antibiotic start and stop dates in addition to other pertinent events   . 5/25 ventriculostomy . 5/26 coil embolization . Ancef 5/25 -> . 5/29 worsening neurological exam consistent with vasospasm, hemodynamic augmentation initiated.   Objective   Blood pressure (!) 185/83, pulse 67, temperature (!) 102 F (38.9 C), temperature source Axillary, resp. rate (!) 21, height 5\' 9"  (1.753 m), weight 66.9 kg, SpO2 100 %.        Intake/Output Summary (Last 24 hours) at 05/24/2021 1243 Last data filed at 05/24/2021 1200 Gross per 24 hour  Intake 10178.29 ml  Output 6949 ml  Net 3229.29 ml   Filed Weights   05/20/21 1211 05/23/21 0400 05/24/21 0400  Weight: 63.5 kg 64.1 kg 66.9 kg    Examination: General: NAD, lying in hospital bed with eyes open HENT: EVD in place with dark bloody effluent. Lungs: Normal WOB on RA Cardiovascular: RRR, no  m/r/g Abdomen: soft, normoactive bowel sounds Extremities: warm and well perfused in all 4 extremities, shortened left first toe w/ no toenail Neuro: Awake but not following commands.  Purposeful on the right side.  Extensor on left upper extremity.  Flexion response in both lower extremities.  Labs/imaging that I have personally reviewed  (right click and "Reselect all SmartList Selections" daily)  Sodium 140. TCD's 5/30 show increased bilateral MCA velocities stable compared to recent studies.  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 Hypertension  Plan:  -Titrate phenylephrine to keep MAP greater than 180-200 -Continue IV fluids monitor for cerebral salt wasting -Continue EVD drainage. -Continue to follow clinical exam and TCD   Best practice (right click and "Reselect all SmartList Selections" daily)  Diet:  per nutrition, cortrak placed 5/27 Pain/Anxiety/Delirium protocol (if indicated): No VAP protocol (if indicated): Not indicated DVT prophylaxis: SCD GI prophylaxis: PPI Glucose control:  SSI No Central venous access:  N/A Arterial line:  N/A Foley:  N/A Mobility:  bed rest  PT consulted: N/A Last date of multidisciplinary goals of care discussion [per primary] Code Status:  full code Disposition: ICU  Labs   CBC: Recent Labs  Lab 05/19/21 1753 05/19/21 2021 05/21/21 1236 05/23/21 1032  WBC 16.4* 19.1* 11.5* 9.8  NEUTROABS 14.1*  --   --   --  HGB 14.9 13.7 12.3* 12.2*  HCT 44.5 39.3 36.3* 36.5*  MCV 98.2 96.1 96.5 97.6  PLT 335 332 302 245    Basic Metabolic Panel: Recent Labs  Lab 05/19/21 1753 05/20/21 1133  05/21/21 1236 05/23/21 1032  NA 141  --  144 140  K 3.6  --  3.2* 3.6  CL 107  --  113* 109  CO2 23  --  23 25  GLUCOSE 128*  --  88 141*  BUN 18  --  10 10  CREATININE 0.68  --  0.59* 0.50*  CALCIUM 9.1  --  8.1* 8.2*  MG  --  2.5* 2.3  --   PHOS  --   --  2.8  --    GFR: Estimated Creatinine Clearance: 94.1 mL/min (A) (by C-G formula based on SCr of 0.5 mg/dL (L)). Recent Labs  Lab 05/19/21 1753 05/19/21 2021 05/21/21 1236 05/23/21 1032  WBC 16.4* 19.1* 11.5* 9.8  LATICACIDVEN 1.8  --   --   --     Liver Function Tests: Recent Labs  Lab 05/19/21 1753 05/23/21 1032  AST 91* 49*  ALT 41 42  ALKPHOS 50 38  BILITOT 1.1 0.2*  PROT 6.8 5.4*  ALBUMIN 3.5 2.6*   No results for input(s): LIPASE, AMYLASE in the last 168 hours. Recent Labs  Lab 05/23/21 1032  AMMONIA 64*    ABG    Component Value Date/Time   HCO3 25.1 (H) 01/24/2008 2351   TCO2 26 01/24/2008 2351   ACIDBASEDEF 1.0 01/24/2008 2351     Coagulation Profile: Recent Labs  Lab 05/19/21 2021 05/23/21 1032  INR 1.0 1.1    Cardiac Enzymes: No results for input(s): CKTOTAL, CKMB, CKMBINDEX, TROPONINI in the last 168 hours.  HbA1C: Hemoglobin A1C  Date/Time Value Ref Range Status  12/14/2013 05:30 AM 5.6 4.2 - 6.3 % Final    Comment:    The American Diabetes Association recommends that a primary goal of therapy should be <7% and that physicians should reevaluate the treatment regimen in patients with HbA1c values consistently >8%.     CBG: Recent Labs  Lab 05/23/21 1934 05/23/21 2318 05/24/21 0319 05/24/21 0738 05/24/21 1131  GLUCAP 161* 157* 145* 147* 170*    Critical care time:    Critical care was necessary to treat or prevent imminent or life-threatening deterioration.  Critical care was time spent personally by me on the following activities: development of treatment plan with patient and/or surrogate as well as nursing, discussions with consultants, evaluation of patient's  response to treatment, examination of patient, obtaining history from patient or surrogate, ordering and performing treatments and interventions, ordering and review of laboratory studies, ordering and review of radiographic studies, pulse oximetry, re-evaluation of patient's condition and participation in multidisciplinary rounds.   Critical Care Time devoted to patient care services described in this note is 40 minutes. This time reflects time of care of this signee Polly Barner . This critical care time does not reflect separately billable procedures or procedure time, teaching time or supervisory time of PA/NP/Med student/Med Resident etc but could involve care discussion time.       [facility] 05/24/2021 12:43 PM  Pager: see AMION  If no response to pager , please call critical care on call (see AMION) until 7pm After 7:00 pm call Elink

## 2021-05-25 ENCOUNTER — Inpatient Hospital Stay (HOSPITAL_COMMUNITY): Payer: 59

## 2021-05-25 DIAGNOSIS — I609 Nontraumatic subarachnoid hemorrhage, unspecified: Secondary | ICD-10-CM | POA: Diagnosis not present

## 2021-05-25 LAB — BASIC METABOLIC PANEL
Anion gap: 7 (ref 5–15)
BUN: 7 mg/dL (ref 6–20)
CO2: 24 mmol/L (ref 22–32)
Calcium: 7.8 mg/dL — ABNORMAL LOW (ref 8.9–10.3)
Chloride: 100 mmol/L (ref 98–111)
Creatinine, Ser: 0.47 mg/dL — ABNORMAL LOW (ref 0.61–1.24)
GFR, Estimated: >60 mL/min (ref >60)
Glucose, Bld: 154 mg/dL — ABNORMAL HIGH (ref 70–99)
Potassium: 3.4 mmol/L — ABNORMAL LOW (ref 3.5–5.1)
Sodium: 131 mmol/L — ABNORMAL LOW (ref 135–145)

## 2021-05-25 LAB — CBC WITH DIFFERENTIAL/PLATELET
Abs Immature Granulocytes: 0.08 10*3/uL — ABNORMAL HIGH (ref 0.00–0.07)
Basophils Absolute: 0.1 10*3/uL (ref 0.0–0.1)
Basophils Relative: 0 %
Eosinophils Absolute: 0.2 10*3/uL (ref 0.0–0.5)
Eosinophils Relative: 1 %
HCT: 35.6 % — ABNORMAL LOW (ref 39.0–52.0)
Hemoglobin: 12.5 g/dL — ABNORMAL LOW (ref 13.0–17.0)
Immature Granulocytes: 1 %
Lymphocytes Relative: 12 %
Lymphs Abs: 1.7 10*3/uL (ref 0.7–4.0)
MCH: 32.6 pg (ref 26.0–34.0)
MCHC: 35.1 g/dL (ref 30.0–36.0)
MCV: 93 fL (ref 80.0–100.0)
Monocytes Absolute: 1.7 10*3/uL — ABNORMAL HIGH (ref 0.1–1.0)
Monocytes Relative: 12 %
Neutro Abs: 10.3 10*3/uL — ABNORMAL HIGH (ref 1.7–7.7)
Neutrophils Relative %: 74 %
Platelets: 249 10*3/uL (ref 150–400)
RBC: 3.83 MIL/uL — ABNORMAL LOW (ref 4.22–5.81)
RDW: 11.6 % (ref 11.5–15.5)
WBC: 14 10*3/uL — ABNORMAL HIGH (ref 4.0–10.5)
nRBC: 0 % (ref 0.0–0.2)

## 2021-05-25 LAB — GLUCOSE, CAPILLARY
Glucose-Capillary: 139 mg/dL — ABNORMAL HIGH (ref 70–99)
Glucose-Capillary: 152 mg/dL — ABNORMAL HIGH (ref 70–99)
Glucose-Capillary: 123 mg/dL — ABNORMAL HIGH (ref 70–99)
Glucose-Capillary: 140 mg/dL — ABNORMAL HIGH (ref 70–99)
Glucose-Capillary: 127 mg/dL — ABNORMAL HIGH (ref 70–99)
Glucose-Capillary: 140 mg/dL — ABNORMAL HIGH (ref 70–99)

## 2021-05-25 LAB — OSMOLALITY, URINE: Osmolality, Ur: 380 mOsm/kg (ref 300–900)

## 2021-05-25 LAB — SODIUM, URINE, RANDOM: Sodium, Ur: 147 mmol/L

## 2021-05-25 MED ORDER — PHENYLEPHRINE CONCENTRATED 100MG/250ML (0.4 MG/ML) INFUSION SIMPLE
0.0000 ug/min | INTRAVENOUS | Status: DC
  Administered 2021-05-25: 200 ug/min via INTRAVENOUS
  Administered 2021-05-25: 100 ug/min via INTRAVENOUS
  Administered 2021-05-26: 90 ug/min via INTRAVENOUS
  Administered 2021-05-27: 140 ug/min via INTRAVENOUS
  Administered 2021-05-27: 165 ug/min via INTRAVENOUS
  Administered 2021-05-28: 140 ug/min via INTRAVENOUS
  Administered 2021-05-28: 195 ug/min via INTRAVENOUS
  Administered 2021-05-29: 190 ug/min via INTRAVENOUS
  Administered 2021-05-29: 215 ug/min via INTRAVENOUS
  Administered 2021-05-29: 220 ug/min via INTRAVENOUS
  Administered 2021-05-30: 200 ug/min via INTRAVENOUS
  Administered 2021-05-30: 210 ug/min via INTRAVENOUS
  Administered 2021-05-31 (×2): 200 ug/min via INTRAVENOUS
  Administered 2021-05-31: 190 ug/min via INTRAVENOUS
  Filled 2021-05-25 (×18): qty 250

## 2021-05-25 MED ORDER — PHENYLEPHRINE HCL-NACL 10-0.9 MG/250ML-% IV SOLN
INTRAVENOUS | Status: AC
  Administered 2021-05-25: 140 ug/min via INTRAVENOUS
  Filled 2021-05-25: qty 250

## 2021-05-25 MED ORDER — SODIUM CHLORIDE 0.9% FLUSH
10.0000 mL | INTRAVENOUS | Status: DC | PRN
Start: 2021-05-25 — End: 2021-07-06
  Administered 2021-05-31: 10 mL

## 2021-05-25 MED ORDER — ALBUMIN HUMAN 5 % IV SOLN
12.5000 g | Freq: Once | INTRAVENOUS | Status: AC
  Administered 2021-05-25: 12.5 g via INTRAVENOUS
  Filled 2021-05-25: qty 250

## 2021-05-25 MED ORDER — PHENYLEPHRINE HCL-NACL 20-0.9 MG/250ML-% IV SOLN
0.0000 ug/min | INTRAVENOUS | Status: DC
  Filled 2021-05-25: qty 250

## 2021-05-25 MED ORDER — POTASSIUM CHLORIDE 20 MEQ PO PACK
40.0000 meq | PACK | Freq: Once | ORAL | Status: AC
  Administered 2021-05-25: 40 meq
  Filled 2021-05-25: qty 2

## 2021-05-25 NOTE — Progress Notes (Signed)
K+ 3.4 Replaced per protocol  

## 2021-05-25 NOTE — Progress Notes (Signed)
Called mother for PICC consent but no answer.

## 2021-05-25 NOTE — Progress Notes (Signed)
  NEUROSURGERY PROGRESS NOTE   No issues overnight.   EXAM:  BP (!) 171/88   Pulse 72   Temp (!) 101.9 F (38.8 C) (Axillary)   Resp (!) 28   Ht 5\' 9"  (1.753 m)   Wt 67 kg   SpO2 100%   BMI 21.81 kg/m   Awake, alert Groans to noxious stimuli Not following commands, Purposeful BUE/BLE L>R EVD in place, functional  IMPRESSION:  60 y.o. male SAH d#6 s/p coiling Acom aneurysm, early clinical spasm appears somewhat improved with hyperdynamic treatment  PLAN: - Cont goal SBP 180-267mmHg, vasopressors PRN. Will need central access - [place] - TCD tomorrow   80m, MD [facility]

## 2021-05-25 NOTE — Progress Notes (Signed)
NAME:  [name] [name], MRN:  [id number], DOB:  03/03/1961, LOS: 6 ADMISSION DATE:  05/19/2021, CONSULTATION DATE:  05/20/21 REFERRING MD:  Lisbeth Renshaw CHIEF COMPLAINT:  SAH  History of Present Illness:  Pt is a 36 you w/ PMH sinusitis, rotator cuff tear, tobacco, marijuana, and ETOH use, remote hx of crack cocaine use who is being managed for intraventricular hemorrhage w/ large acute SAH now s/p ventriculostomy.  Pt notable seen in ED for frontal headache and had negative heat CT at that time. Pt spoke with family on Monday. Wednesday did not come to work prompting call to EMS; EMS found minimally responsive at home. PT's mother denies knowledge of any recent trauma.   CT head on admission SAH and CTA showed 3 mm aneurysm from anterior communicating artery thought to have ruptured and caused hemorrhage. Ventriculostomy done at bedside in ED.    Pertinent  Medical History  Sinusitis Tobacco, ETOH, and marijuana use Remote history cocaine use Rotator cuff tear Left toe amputation   Significant Hospital Events: Including procedures, antibiotic start and stop dates in addition to other pertinent events   . 5/25 ventriculostomy . 5/26 coil embolization . Ancef 5/25 -> . 5/29 worsening neurological exam consistent with vasospasm, hemodynamic augmentation initiated.   Interim history/subjective:  Patient was febrile overnight with T-max 102.4.  CBC was collected; showing a leukocytosis with WBC of 14.  Patient neurological exam remains similar to previous days.  Patient does awake to noxious stimulus.  He also has purposeful movements to remove stimulus.  He is still unable to follow commands.    Objective   Blood pressure (!) 195/84, pulse 64, temperature (!) 101.9 F (38.8 C), temperature source Axillary, resp. rate 19, height 5\' 9"  (1.753 m), weight 67 kg, SpO2 100 %.        Intake/Output Summary (Last 24 hours) at 05/25/2021 1321 Last data filed at 05/25/2021 1200 Gross  per 24 hour  Intake 10284.6 ml  Output 7515 ml  Net 2769.6 ml   Filed Weights   05/23/21 0400 05/24/21 0400 05/25/21 0500  Weight: 64.1 kg 66.9 kg 67 kg    Examination: General: Patient in no acute distress, laying in bed with eyes closed  HENT: EVD with dark red blood  lungs: Normal work of breathing  cardiovascular: Regular rate and rhythm, no M/R/G  abdomen: Soft, NABS  extremities: Warm and well perfused in all 4 extremities  neuro: Patient will open eyes to noxious stimulus; purposeful movements to remove stimulus.  He is currently unable to follow commands.    Labs/imaging that I have personally reviewed  (right click and "Reselect all SmartList Selections" daily)  Sodium 131. Potassium 3.4 Calcium 7 Wbc 14.0 Hgb 12.5  Urine sodium and osmolarity; 147 mmol/l and 380 mOsm/kg respectively.  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 Hypertension  -Titrate phenylephrine to keep SBP greater than 180-200 -Continue EVD drainage. -Continue ancef while shunt remains in place -Continue Nimotop -continue AED keppra -Continue to follow clinical exam and TCD - 5/31 Echocardiogram    Persistent fevers Leukocytosis On empiric ancef while shunt is in place.  -WBC of 14.0 -No hypertension present; but currently on vasopressors -Patient's fevers are likely neurogenic -If patient continues to be febrile, blood cultures  and procalcitonin should be considered.  Hyponatremia Hypokalemia -Patient is not hypotensive but is currently on vasopressors. No current clinical evidence of extracellular volume depletion; no hypotension or turgor.  Given his  presentation, concern for SIADH or cerebral salt wasting.  - Urine sodium and osmolarity ordered; 147 mmol/l and 380 mOsm/kg respectively. - 5/31 3.191 L of urine out; net output +43.6. -Given patient's findings of cerebral salt wastingis more likely an SIADH -Continue IV fluids monitor .  -Albumin 5% solution 12.5 g -Adult ICU Electrolyte Replacement Protocol   Best practice (right click and "Reselect all SmartList Selections" daily)  Diet:  per nutrition, cortrak placed 5/27 Pain/Anxiety/Delirium protocol (if indicated): No VAP protocol (if indicated): Not indicated DVT prophylaxis: SCD GI prophylaxis: PPI Glucose control:  SSI No Central venous access:  N/A Arterial line:  N/A Foley:  N/A Mobility:  bed rest  PT consulted: N/A Last date of multidisciplinary goals of care discussion  per primary Code Status:  full code Disposition: ICU  Labs   CBC: Recent Labs  Lab 05/19/21 1753 05/19/21 2021 05/21/21 1236 05/23/21 1032 05/25/21 0255  WBC 16.4* 19.1* 11.5* 9.8 14.0*  NEUTROABS 14.1*  --   --   --  10.3*  HGB 14.9 13.7 12.3* 12.2* 12.5*  HCT 44.5 39.3 36.3* 36.5* 35.6*  MCV 98.2 96.1 96.5 97.6 93.0  PLT 335 332 302 245 249    Basic Metabolic Panel: Recent Labs  Lab 05/19/21 1753 05/20/21 1133 05/21/21 1236 05/23/21 1032 05/25/21 0255  NA 141  --  144 140 131*  K 3.6  --  3.2* 3.6 3.4*  CL 107  --  113* 109 100  CO2 23  --  23 25 24   GLUCOSE 128*  --  88 141* 154*  BUN 18  --  10 10 7   CREATININE 0.68  --  0.59* 0.50* 0.47*  CALCIUM 9.1  --  8.1* 8.2* 7.8*  MG  --  2.5* 2.3  --   --   PHOS  --   --  2.8  --   --    GFR: Estimated Creatinine Clearance: 94.2 mL/min (A) (by C-G formula based on SCr of 0.47 mg/dL (L)). Recent Labs  Lab 05/19/21 1753 05/19/21 2021 05/21/21 1236 05/23/21 1032 05/25/21 0255  WBC 16.4* 19.1* 11.5* 9.8 14.0*  LATICACIDVEN 1.8  --   --   --   --     Liver Function Tests: Recent Labs  Lab 05/19/21 1753 05/23/21 1032   AST 91* 49*  ALT 41 42  ALKPHOS 50 38  BILITOT 1.1 0.2*  PROT 6.8 5.4*  ALBUMIN 3.5 2.6*   No results for input(s): LIPASE, AMYLASE in the last 168 hours. Recent Labs  Lab 05/23/21 1032  AMMONIA 64*    ABG    Component Value Date/Time   HCO3 25.1 (H) 01/24/2008 2351   TCO2 26 01/24/2008 2351   ACIDBASEDEF 1.0 01/24/2008 2351     Coagulation Profile: Recent Labs  Lab 05/19/21 2021 05/23/21 1032  INR 1.0 1.1    Cardiac Enzymes: No results for input(s): CKTOTAL, CKMB, CKMBINDEX, TROPONINI in the last 168 hours.  HbA1C: Hemoglobin A1C  Date/Time Value Ref Range Status  12/14/2013 05:30 AM 5.6 4.2 - 6.3 % Final    Comment:    The American Diabetes Association recommends that a primary goal of therapy should be <7% and that physicians should reevaluate the treatment regimen in patients with HbA1c values consistently >8%.     CBG:  Recent Labs  Lab 05/24/21 1935 05/24/21 2317 05/25/21 0329 05/25/21 0736 05/25/21 1136  GLUCAP 132* 147* 139* 152* 123*    Critical care time:       Dennie Bible MS4

## 2021-05-25 NOTE — Progress Notes (Signed)
Peripherally Inserted Central Catheter Placement  The IV Nurse has discussed with the patient and/or persons authorized to consent for the patient, the purpose of this procedure and the potential benefits and risks involved with this procedure.  The benefits include less needle sticks, lab draws from the catheter, and the patient may be discharged home with the catheter. Risks include, but not limited to, infection, bleeding, blood clot (thrombus formation), and puncture of an artery; nerve damage and irregular heartbeat and possibility to perform a PICC exchange if needed/ordered by physician.  Alternatives to this procedure were also discussed.  Bard Power PICC patient education guide, fact sheet on infection prevention and patient information card has been provided to patient /or left at bedside.    PICC Placement Documentation  PICC Triple Lumen 05/25/21 PICC Right Basilic 38 cm 1 cm (Active)  Indication for Insertion or Continuance of Line Vasoactive infusions 05/25/21 0933  Exposed Catheter (cm) 0 cm 05/25/21 0933  Site Assessment Clean;Dry;Intact 05/25/21 0933  Lumen #1 Status Flushed;Blood return noted;Saline locked 05/25/21 0933  Lumen #2 Status Flushed;Blood return noted;Saline locked 05/25/21 0933  Lumen #3 Status Flushed;Blood return noted;Saline locked 05/25/21 0933  Dressing Type Transparent 05/25/21 0933  Dressing Status Clean;Dry;Intact 05/25/21 0933  Antimicrobial disc in place? Yes 05/25/21 0933  Dressing Change Due 06/01/21 05/25/21 0933       Audrie Gallus 05/25/2021, 9:36 AM

## 2021-05-25 NOTE — Progress Notes (Signed)
Neurosurgery Service Progress Note  Subjective: No acute events overnight   Objective: Vitals:   05/24/21 2100 05/24/21 2200 05/24/21 2300 05/25/21 0000  BP: (!) 184/88 (!) 148/75 (!) 167/97   Pulse: 67 72 80   Resp: 20 (!) 23 (!) 29   Temp:    (!) 100.5 F (38.1 C)  TempSrc:    Axillary  SpO2: 100% 100% 100%   Weight:      Height:        Physical Exam: Eyes open spontaneously, PERRL, gaze neutral, FC intermittently in BLE, moves upper extremities spontaneously, nonverbal  EVD patent, draining bloody CSF @ +5  Assessment & Plan: 60 y.o. man w/ ruptured AComm, day 5 w/ spasm on initial treatment angiogram. 5/29 started pressors for suspected symptomatic vasospasm, CTA w/ worsening spasm   -continue SBP 180 - 200, exam improved -getting TF, FWF, NS @ 100, good UOP -SCDs/TEDs, SQH  Jadene Pierini  05/25/21 12:43 AM

## 2021-05-26 ENCOUNTER — Inpatient Hospital Stay (HOSPITAL_COMMUNITY): Payer: 59

## 2021-05-26 DIAGNOSIS — I609 Nontraumatic subarachnoid hemorrhage, unspecified: Secondary | ICD-10-CM

## 2021-05-26 DIAGNOSIS — I428 Other cardiomyopathies: Secondary | ICD-10-CM | POA: Diagnosis not present

## 2021-05-26 LAB — BASIC METABOLIC PANEL
Anion gap: 6 (ref 5–15)
Anion gap: 11 (ref 5–15)
BUN: 8 mg/dL (ref 6–20)
BUN: 7 mg/dL (ref 6–20)
CO2: 24 mmol/L (ref 22–32)
CO2: 23 mmol/L (ref 22–32)
Calcium: 7.4 mg/dL — ABNORMAL LOW (ref 8.9–10.3)
Calcium: 7.9 mg/dL — ABNORMAL LOW (ref 8.9–10.3)
Chloride: 98 mmol/L (ref 98–111)
Chloride: 94 mmol/L — ABNORMAL LOW (ref 98–111)
Creatinine, Ser: 0.46 mg/dL — ABNORMAL LOW (ref 0.61–1.24)
Creatinine, Ser: 0.5 mg/dL — ABNORMAL LOW (ref 0.61–1.24)
GFR, Estimated: >60 mL/min (ref >60)
GFR, Estimated: >60 mL/min (ref >60)
Glucose, Bld: 139 mg/dL — ABNORMAL HIGH (ref 70–99)
Glucose, Bld: 122 mg/dL — ABNORMAL HIGH (ref 70–99)
Potassium: 3.2 mmol/L — ABNORMAL LOW (ref 3.5–5.1)
Potassium: 3.7 mmol/L (ref 3.5–5.1)
Sodium: 128 mmol/L — ABNORMAL LOW (ref 135–145)
Sodium: 128 mmol/L — ABNORMAL LOW (ref 135–145)

## 2021-05-26 LAB — ECHOCARDIOGRAM COMPLETE
Area-P 1/2: 3.21 cm2
Height: 69 in
S' Lateral: 2.9 cm
Weight: 2183.44 oz

## 2021-05-26 LAB — CBC WITH DIFFERENTIAL/PLATELET
Abs Immature Granulocytes: 0.05 10*3/uL (ref 0.00–0.07)
Basophils Absolute: 0.1 10*3/uL (ref 0.0–0.1)
Basophils Relative: 0 %
Eosinophils Absolute: 0.1 10*3/uL (ref 0.0–0.5)
Eosinophils Relative: 1 %
HCT: 31.4 % — ABNORMAL LOW (ref 39.0–52.0)
Hemoglobin: 11 g/dL — ABNORMAL LOW (ref 13.0–17.0)
Immature Granulocytes: 0 %
Lymphocytes Relative: 10 %
Lymphs Abs: 1.3 10*3/uL (ref 0.7–4.0)
MCH: 32.8 pg (ref 26.0–34.0)
MCHC: 35 g/dL (ref 30.0–36.0)
MCV: 93.7 fL (ref 80.0–100.0)
Monocytes Absolute: 1.6 10*3/uL — ABNORMAL HIGH (ref 0.1–1.0)
Monocytes Relative: 12 %
Neutro Abs: 10.7 10*3/uL — ABNORMAL HIGH (ref 1.7–7.7)
Neutrophils Relative %: 77 %
Platelets: 231 10*3/uL (ref 150–400)
RBC: 3.35 MIL/uL — ABNORMAL LOW (ref 4.22–5.81)
RDW: 11.7 % (ref 11.5–15.5)
WBC: 13.8 10*3/uL — ABNORMAL HIGH (ref 4.0–10.5)
nRBC: 0 % (ref 0.0–0.2)

## 2021-05-26 LAB — GLUCOSE, CAPILLARY
Glucose-Capillary: 136 mg/dL — ABNORMAL HIGH (ref 70–99)
Glucose-Capillary: 142 mg/dL — ABNORMAL HIGH (ref 70–99)
Glucose-Capillary: 154 mg/dL — ABNORMAL HIGH (ref 70–99)
Glucose-Capillary: 147 mg/dL — ABNORMAL HIGH (ref 70–99)
Glucose-Capillary: 132 mg/dL — ABNORMAL HIGH (ref 70–99)
Glucose-Capillary: 144 mg/dL — ABNORMAL HIGH (ref 70–99)

## 2021-05-26 MED ORDER — POTASSIUM CHLORIDE 20 MEQ PO PACK
20.0000 meq | PACK | ORAL | Status: AC
  Administered 2021-05-26 (×2): 20 meq

## 2021-05-26 MED ORDER — SODIUM CHLORIDE 1 G PO TABS
1.0000 g | ORAL_TABLET | Freq: Two times a day (BID) | ORAL | Status: DC
  Administered 2021-05-26 – 2021-05-27 (×3): 1 g
  Filled 2021-05-26 (×3): qty 1

## 2021-05-26 MED ORDER — FLUDROCORTISONE ACETATE 0.1 MG PO TABS
0.1000 mg | ORAL_TABLET | Freq: Every day | ORAL | Status: DC
  Administered 2021-05-26: 0.1 mg
  Filled 2021-05-26 (×2): qty 1

## 2021-05-26 MED ORDER — NUTRISOURCE FIBER PO PACK
1.0000 | PACK | Freq: Two times a day (BID) | ORAL | Status: DC
  Administered 2021-05-26 – 2021-07-05 (×79): 1
  Filled 2021-05-26 (×84): qty 1

## 2021-05-26 MED ORDER — SODIUM CHLORIDE 0.9 % IV BOLUS
1000.0000 mL | Freq: Once | INTRAVENOUS | Status: AC
  Administered 2021-05-26: 1000 mL via INTRAVENOUS

## 2021-05-26 NOTE — Progress Notes (Signed)
  NEUROSURGERY PROGRESS NOTE   Pt seen and examined. No issues overnight.   EXAM: Temp:  [99.1 F (37.3 C)-100.6 F (38.1 C)] 99.1 F (37.3 C) (06/01 1600) Pulse Rate:  [73-113] 93 (06/01 1810) Resp:  [15-31] 23 (06/01 1810) BP: (154-214)/(63-102) 188/66 (06/01 1810) SpO2:  [74 %-100 %] 96 % (06/01 1810) Weight:  [61.9 kg-68 kg] 61.9 kg (06/01 1045) Intake/Output      05/31 0701 06/01 0700 06/01 0701 06/02 0700   I.V. (mL/kg) 4434.5 (65.2) 1399 (22.6)   Other 30    NG/GT 2015 715   IV Piggyback 450.1 1126   Total Intake(mL/kg) 6929.6 (101.9) 3239.9 (52.3)   Urine (mL/kg/hr) 5925 (3.6) 2275 (3.2)   Drains 154 68   Stool 900    Total Output 6979 2343   Net -49.4 +896.9        Urine Occurrence 1 x 1 x    Awake, alert,  No verbal responses to questions [employer] BUE/BLE purposefully, L>R Not following commands EVD in place  LABS: Lab Results  Component Value Date   CREATININE 0.46 (L) 05/26/2021   BUN 8 05/26/2021   NA 128 (L) 05/26/2021   K 3.2 (L) 05/26/2021   CL 98 05/26/2021   CO2 24 05/26/2021   Lab Results  Component Value Date   WBC 13.8 (H) 05/26/2021   HGB 11.0 (L) 05/26/2021   HCT 31.4 (L) 05/26/2021   MCV 93.7 05/26/2021   PLT 231 05/26/2021    IMPRESSION: - 60 y.o. male SAH d#7 (could be later given visits to ED for HA prior to admission) with symptomatic spasm. Remains encephalopathic but largely non-focal  PLAN: - cont hyperdynamic treatment with SBP goal > and IVF hydration - Monitor Na - Cont Nimotop and TCD monitoring - Low grade fever and copious stool, currently on Ancef for EVD - will consider C. Diff test   Lisbeth Renshaw, MD [facility]

## 2021-05-26 NOTE — Progress Notes (Signed)
Assisted tele visit to patient with daughter.  Thomas, Shantell Belongia Renee, RN   

## 2021-05-26 NOTE — Progress Notes (Signed)
Transcranial Doppler  Date POD PCO2 HCT BP  MCA ACA PCA OPHT SIPH VERT Basilar  5/27 MS     Right  Left   154  158   -54  -37   21  25   28  21    *  *   -21  -28     -65    5/30 MS     Right  Left   135  142   -38  -148   46  20   17  25   21   47   *  *   *      6/1 JP     Right  Left   128  156   -144  -55   29  30   22  20    *  *   -52  -53   -58            Right  Left                                             Right  Left                                            Right  Left                                            Right  Left                                        MCA = Middle Cerebral Artery      OPHT = Opthalmic Artery     BASILAR = Basilar Artery   ACA = Anterior Cerebral Artery     SIPH = Carotid Siphon PCA = Posterior Cerebral Artery   VERT = Verterbral Artery                   Normal MCA = 62+\-12 ACA = 50+\-12 PCA = 42+\-23   6/30, BS, RDMS, RVT

## 2021-05-26 NOTE — Progress Notes (Signed)
Echocardiogram 2D Echocardiogram has been performed.  [name] [name] 05/26/2021, 2:02 PM

## 2021-05-26 NOTE — Progress Notes (Signed)
NAME:  [name] [name], MRN:  [id number], DOB:  1961-09-16, LOS: 7 ADMISSION DATE:  05/19/2021, CONSULTATION DATE:  05/20/21 REFERRING MD:  Lisbeth Renshaw CHIEF COMPLAINT:  SAH  History of Present Illness:  Pt is a 74 you w/ PMH sinusitis, rotator cuff tear, tobacco, marijuana, and ETOH use, remote hx of crack cocaine use who is being managed for intraventricular hemorrhage w/ large acute SAH now s/p ventriculostomy.  Pt notable seen in ED for frontal headache and had negative heat CT at that time. Pt spoke with family on Monday. Wednesday did not come to work prompting call to EMS; EMS found minimally responsive at home. PT's mother denies knowledge of any recent trauma.   CT head on admission SAH and CTA showed 3 mm aneurysm from anterior communicating artery thought to have ruptured and caused hemorrhage. Ventriculostomy done at bedside in ED.    Pertinent  Medical History  Sinusitis Tobacco, ETOH, and marijuana use Remote history cocaine use Rotator cuff tear Left toe amputation   Significant Hospital Events: Including procedures, antibiotic start and stop dates in addition to other pertinent events   . 5/25 ventriculostomy . 5/26 coil embolization . Ancef 5/25 -> . 5/29 worsening neurological exam consistent with vasospasm, hemodynamic augmentation initiated.   Interim history/subjective:  Patient had one febrile event overnight with a T-max of 100.9.  Otherwise, he was not febrile.  Neurologically, Patient's presentation minimally improved. Patient is more awake this morning with eyes open. He is currently unable to communicate verbally,  Pt continues to respond to noxious stimulus. Limited respond verbal commands. He was able to move right arm but not his other extremities.   Per nursing, Patient stool have been loose.   Objective   Blood pressure (!) 178/80, pulse 89, temperature (!) 100.6 F (38.1 C), temperature source Axillary, resp. rate (!) 22, height 5\' 9"   (1.753 m), weight 61.9 kg, SpO2 100 %.        Intake/Output Summary (Last 24 hours) at 05/26/2021 1336 Last data filed at 05/26/2021 1300 Gross per 24 hour  Intake 5588.32 ml  Output 4779 ml  Net 809.32 ml   Filed Weights   05/25/21 0500 05/26/21 0500 05/26/21 1045  Weight: 67 kg 68 kg 61.9 kg    Examination: General: Patient in no acute distress.  Patient laying in bed with eyes open . HENT: EVD with dark red blood  lungs: Normal work of breathing  Cardiovascular: Regular rate and rhythm; no R/M/G.   Abdomen: Soft, NABS  extremities: Warm and well perfused in all 4 extremities  neuro: Patient awake during exam.  Patient unable to respond verbally.  Eyes open with sluggish  accommodation.  Patient continues respond to noxious stimulus. Limited response to verbal commands: Able to move right arm when commanded but not other extremities.   Labs/imaging that I have personally reviewed  (right click and "Reselect all SmartList Selections" daily)  Sodium 128 Potassium 3.2 Calcium 7.4 Wbc 13.8 Hgb 11.0  5/29 ECHO - EF 60 -65 %; grossly normal  Resolved Hospital Problem list     Assessment & Plan:   Critically ill due to vasospasm following aneurysmal subarachnoid hemorrhage requiring initiation of hemodynamic augmentation Anterior communicating artery aneurysm status post coil embolization 526 Communicating hydrocephalus due to subarachnoid hemorrhage Pyrexia likely secondary to intracerebral hemorrhage. Hypertension -Titrate phenylephrine to keep SBP greater than 180-200 -Continue EVD drainage. -Continue ancef while shunt remains in place -Continue Nimotop -continue AED keppra -Continue to follow clinical exam and TCD -  TCD today  Persistent fevers Leukocytosis - improving; 6/1 WBC 13.8 On empiric ancef while shunt is in place.  -No hypertension present; but currently on vasopressors -Patient's fevers are likely neurogenic -If patient continues to be febrile, blood  cultures and procalcitonin should be considered.  Hyponatremia Hypokalemia -Patient is not hypotensive but is currently on vasopressors. No current clinical evidence of extracellular volume depletion; no hypotension or turgor.  -concern for  cerebral salt wasting.  -6/1 Na 128  And K 3.2 -Maintenance fluids increase; NS 125 ml/hr -salt tablet 1g bid per tude  -Fludrocortisone 0.1 mg for CSW -Continue IV fluids monitor.  -Afternoon BMP ordered   -Adult ICU Electrolyte Replacement Protocol   Best practice (right click and "Reselect all SmartList Selections" daily)  Diet:  per nutrition, cortrak placed 5/27 Pain/Anxiety/Delirium protocol (if indicated): No VAP protocol (if indicated): Not indicated DVT prophylaxis: SCD GI prophylaxis: PPI Glucose control:  SSI No Central venous access:  N/A Arterial line:  N/A Foley:  N/A Mobility:  bed rest  PT consulted: N/A Last date of multidisciplinary goals of care discussion  per primary Code Status:  full code Disposition: ICU  Labs   CBC: Recent Labs  Lab 05/19/21 1753 05/19/21 2021 05/21/21 1236 05/23/21 1032 05/25/21 0255 05/26/21 0547  WBC 16.4* 19.1* 11.5* 9.8 14.0* 13.8*  NEUTROABS 14.1*  --   --   --  10.3* 10.7*  HGB 14.9 13.7 12.3* 12.2* 12.5* 11.0*  HCT 44.5 39.3 36.3* 36.5* 35.6* 31.4*  MCV 98.2 96.1 96.5 97.6 93.0 93.7  PLT 335 332 302 245 249 231    Basic Metabolic Panel: Recent Labs  Lab 05/19/21 1753 05/20/21 1133 05/21/21 1236 05/23/21 1032 05/25/21 0255 05/26/21 0547  NA 141  --  144 140 131* 128*  K 3.6  --  3.2* 3.6 3.4* 3.2*  CL 107  --  113* 109 100 98  CO2 23  --  23 25 24 24   GLUCOSE 128*  --  88 141* 154* 139*  BUN 18  --  10 10 7 8   CREATININE 0.68  --  0.59* 0.50* 0.47* 0.46*  CALCIUM 9.1  --  8.1* 8.2* 7.8* 7.4*  MG  --  2.5* 2.3  --   --   --   PHOS  --   --  2.8  --   --   --    GFR: Estimated Creatinine Clearance: 87 mL/min (A) (by C-G formula based on SCr of 0.46 mg/dL  (L)). Recent Labs  Lab 05/19/21 1753 05/19/21 2021 05/21/21 1236 05/23/21 1032 05/25/21 0255 05/26/21 0547  WBC 16.4*   < > 11.5* 9.8 14.0* 13.8*  LATICACIDVEN 1.8  --   --   --   --   --    < > = values in this interval not displayed.    Liver Function Tests: Recent Labs  Lab 05/19/21 1753 05/23/21 1032  AST 91* 49*  ALT 41 42  ALKPHOS 50 38  BILITOT 1.1 0.2*  PROT 6.8 5.4*  ALBUMIN 3.5 2.6*   No results for input(s): LIPASE, AMYLASE in the last 168 hours. Recent Labs  Lab 05/23/21 1032  AMMONIA 64*    ABG    Component Value Date/Time   HCO3 25.1 (H) 01/24/2008 2351   TCO2 26 01/24/2008 2351   ACIDBASEDEF 1.0 01/24/2008 2351     Coagulation Profile: Recent Labs  Lab 05/19/21 2021 05/23/21 1032  INR 1.0 1.1    Cardiac Enzymes: No results for  input(s): CKTOTAL, CKMB, CKMBINDEX, TROPONINI in the last 168 hours.  HbA1C: Hemoglobin A1C  Date/Time Value Ref Range Status  12/14/2013 05:30 AM 5.6 4.2 - 6.3 % Final    Comment:    The American Diabetes Association recommends that a primary goal of therapy should be <7% and that physicians should reevaluate the treatment regimen in patients with HbA1c values consistently >8%.     CBG: Recent Labs  Lab 05/25/21 1941 05/25/21 2324 05/26/21 0335 05/26/21 0749 05/26/21 1124  GLUCAP 127* 140* 136* 142* 154*    Critical care time:     Dennie Bible MS4

## 2021-05-27 DIAGNOSIS — I609 Nontraumatic subarachnoid hemorrhage, unspecified: Secondary | ICD-10-CM | POA: Diagnosis not present

## 2021-05-27 LAB — CBC WITH DIFFERENTIAL/PLATELET
Abs Immature Granulocytes: 0.05 10*3/uL (ref 0.00–0.07)
Basophils Absolute: 0 10*3/uL (ref 0.0–0.1)
Basophils Relative: 0 %
Eosinophils Absolute: 0.1 10*3/uL (ref 0.0–0.5)
Eosinophils Relative: 1 %
HCT: 32.7 % — ABNORMAL LOW (ref 39.0–52.0)
Hemoglobin: 11.4 g/dL — ABNORMAL LOW (ref 13.0–17.0)
Immature Granulocytes: 0 %
Lymphocytes Relative: 10 %
Lymphs Abs: 1.1 10*3/uL (ref 0.7–4.0)
MCH: 33.1 pg (ref 26.0–34.0)
MCHC: 34.9 g/dL (ref 30.0–36.0)
MCV: 95.1 fL (ref 80.0–100.0)
Monocytes Absolute: 1.4 10*3/uL — ABNORMAL HIGH (ref 0.1–1.0)
Monocytes Relative: 12 %
Neutro Abs: 8.7 10*3/uL — ABNORMAL HIGH (ref 1.7–7.7)
Neutrophils Relative %: 77 %
Platelets: 260 10*3/uL (ref 150–400)
RBC: 3.44 MIL/uL — ABNORMAL LOW (ref 4.22–5.81)
RDW: 11.8 % (ref 11.5–15.5)
WBC: 11.4 10*3/uL — ABNORMAL HIGH (ref 4.0–10.5)
nRBC: 0 % (ref 0.0–0.2)

## 2021-05-27 LAB — BASIC METABOLIC PANEL
Anion gap: 6 (ref 5–15)
BUN: 7 mg/dL (ref 6–20)
CO2: 22 mmol/L (ref 22–32)
Calcium: 7.3 mg/dL — ABNORMAL LOW (ref 8.9–10.3)
Chloride: 101 mmol/L (ref 98–111)
Creatinine, Ser: 0.41 mg/dL — ABNORMAL LOW (ref 0.61–1.24)
GFR, Estimated: >60 mL/min (ref >60)
Glucose, Bld: 124 mg/dL — ABNORMAL HIGH (ref 70–99)
Potassium: 3.2 mmol/L — ABNORMAL LOW (ref 3.5–5.1)
Sodium: 129 mmol/L — ABNORMAL LOW (ref 135–145)

## 2021-05-27 LAB — GLUCOSE, CAPILLARY
Glucose-Capillary: 144 mg/dL — ABNORMAL HIGH (ref 70–99)
Glucose-Capillary: 127 mg/dL — ABNORMAL HIGH (ref 70–99)
Glucose-Capillary: 123 mg/dL — ABNORMAL HIGH (ref 70–99)
Glucose-Capillary: 134 mg/dL — ABNORMAL HIGH (ref 70–99)
Glucose-Capillary: 120 mg/dL — ABNORMAL HIGH (ref 70–99)
Glucose-Capillary: 135 mg/dL — ABNORMAL HIGH (ref 70–99)

## 2021-05-27 MED ORDER — FLUDROCORTISONE ACETATE 0.1 MG PO TABS
0.2000 mg | ORAL_TABLET | Freq: Every day | ORAL | Status: DC
  Administered 2021-05-27 – 2021-06-06 (×11): 0.2 mg
  Filled 2021-05-27 (×12): qty 2

## 2021-05-27 MED ORDER — POTASSIUM CHLORIDE 10 MEQ/50ML IV SOLN
10.0000 meq | INTRAVENOUS | Status: AC
  Administered 2021-05-27 (×3): 10 meq via INTRAVENOUS
  Filled 2021-05-27 (×4): qty 50

## 2021-05-27 MED ORDER — POTASSIUM CHLORIDE 20 MEQ PO PACK
20.0000 meq | PACK | ORAL | Status: AC
  Administered 2021-05-27 (×2): 20 meq
  Filled 2021-05-27 (×2): qty 1

## 2021-05-27 MED ORDER — SODIUM CHLORIDE 1 G PO TABS
1.0000 g | ORAL_TABLET | Freq: Three times a day (TID) | ORAL | Status: DC
  Administered 2021-05-27 – 2021-05-30 (×9): 1 g
  Filled 2021-05-27 (×9): qty 1

## 2021-05-27 MED ORDER — POTASSIUM CHLORIDE 10 MEQ/50ML IV SOLN
10.0000 meq | Freq: Once | INTRAVENOUS | Status: AC
  Administered 2021-05-27: 10 meq via INTRAVENOUS

## 2021-05-27 NOTE — Progress Notes (Signed)
  NEUROSURGERY PROGRESS NOTE   Pt seen and examined. No issues overnight.   EXAM: Temp:  [99.1 F (37.3 C)-101.3 F (38.5 C)] 100.9 F (38.3 C) (06/02 0800) Pulse Rate:  [75-113] 78 (06/02 1145) Resp:  [15-32] 22 (06/02 1145) BP: (150-212)/(62-152) 196/109 (06/02 1145) SpO2:  [74 %-100 %] 100 % (06/02 1145) Weight:  [62.5 kg] 62.5 kg (06/02 0500) Intake/Output      06/01 0701 06/02 0700 06/02 0701 06/03 0700   I.V. (mL/kg) 3141.9 (50.3) 724.3 (11.6)   Other     NG/GT 1170 725   IV Piggyback 1226 103.3   Total Intake(mL/kg) 5537.9 (88.6) 1552.6 (24.8)   Urine (mL/kg/hr) 3875 (2.6) 1550 (3.2)   Drains 128 21   Stool 225    Total Output 4228 1571   Net +1309.9 -18.4        Urine Occurrence 1 x     Awake, alert,  Per RN, verbalizes some responses, able to read signs on wall Intermittently FC Moving BUE/BLE pursposefully EVD in place, open @ 5  LABS: Lab Results  Component Value Date   CREATININE 0.41 (L) 05/27/2021   BUN 7 05/27/2021   NA 129 (L) 05/27/2021   K 3.2 (L) 05/27/2021   CL 101 05/27/2021   CO2 22 05/27/2021   Lab Results  Component Value Date   WBC 11.4 (H) 05/27/2021   HGB 11.4 (L) 05/27/2021   HCT 32.7 (L) 05/27/2021   MCV 95.1 05/27/2021   PLT 260 05/27/2021    IMPRESSION: - 60 y.o. male SAH d#8 (could be later given visits to ED for HA prior to admission) with symptomatic spasm. Remains encephalopathic but improved today. May be a component of delerium in addition to spasm - Likely CSW (Na 129)  PLAN: - cont hyperdynamic treatment with SBP goal > and IVF hydration (pt exam seems improved with SBP down to 160 rather than 180 as previous) - Monitor Na - on fluorinef - Cont Nimotop and TCD monitoring - Will d/c ancef for EVD prophylaxis   Lisbeth Renshaw, MD [facility]

## 2021-05-27 NOTE — Progress Notes (Signed)
NAME:  [name] [name], MRN:  [id number], DOB:  Jul 03, 1961, LOS: 8 ADMISSION DATE:  05/19/2021, CONSULTATION DATE:  05/20/21 REFERRING MD:  Lisbeth Renshaw CHIEF COMPLAINT:  SAH  History of Present Illness:  Pt is a 26 you w/ PMH sinusitis, rotator cuff tear, tobacco, marijuana, and ETOH use, remote hx of crack cocaine use who is being managed for intraventricular hemorrhage w/ large acute SAH now s/p ventriculostomy.  Pt notable seen in ED for frontal headache and had negative heat CT at that time. Pt spoke with family on Monday. Wednesday did not come to work prompting call to EMS; EMS found minimally responsive at home. PT's mother denies knowledge of any recent trauma.   CT head on admission SAH and CTA showed 3 mm aneurysm from anterior communicating artery thought to have ruptured and caused hemorrhage. Ventriculostomy done at bedside in ED.    Pertinent  Medical History  Sinusitis Tobacco, ETOH, and marijuana use Remote history cocaine use Rotator cuff tear Left toe amputation   Significant Hospital Events: Including procedures, antibiotic start and stop dates in addition to other pertinent events   . 5/25 ventriculostomy . 5/26 coil embolization . Ancef 5/25 -> . 5/29 worsening neurological exam consistent with vasospasm, hemodynamic augmentation initiated. . 6/1 worsening hyponatremia: Na of 120.  Patient started on salt tablets 1 g twice daily and fludrocortisone 0.1 mg.   Interim history/subjective:  Patient had 2 febrile events overnight.  T-max of 101.3.  Patient WBC trending downwards; currently 11.4, down from 13.8 yesterday.   Neurologic exam for the patient continues to remain similar.  Patient was less awake this morning.  He continues to respond to noxious stimulus.  Patient does try to [facility] movements purposefully.  Patient was not responsive to verbal commands this morning; he did not move any of his extremities instructed.  Patient still unable to communicate  verbally.  Patient current goal systolic blood pressure is between 180 - 200.  Patient goal blood pressure will be reduced 160 -180 to examine neurological function in that range.   Per nursing, patient's stool have bulked with addition of fiber.   Objective   Blood pressure (!) 200/93, pulse 88, temperature (!) 100.9 F (38.3 C), temperature source Axillary, resp. rate (!) 26, height 5\' 9"  (1.753 m), weight 62.5 kg, SpO2 100 %.        Intake/Output Summary (Last 24 hours) at 05/27/2021 0902 Last data filed at 05/27/2021 0800 Gross per 24 hour  Intake 5534.74 ml  Output 4268 ml  Net 1266.74 ml   Filed Weights   05/26/21 0500 05/26/21 1045 05/27/21 0500  Weight: 68 kg 61.9 kg 62.5 kg    Examination: General: Patient in no acute distress.  Patient lying in bed with eyes closed.07/27/21 HENT: EVD with dark red blood  lungs: Patient intermittently tachypneic.  Cough present.  No increased work of breathing. cardiovascular: Regular rate and rhythm; no R/M/G.   Abdomen: Soft, NABS  extremities: Warm and well perfused in all 4 extremities  neuro: Patient awakes to name and opens eyes.  Patient unable to respond verbally. Patient continues respond to noxious stimulus.  Patient was unable to follow verbal commands this morning .   Labs/imaging that I have personally reviewed  (right click and "Reselect all SmartList Selections" daily)  Sodium 129 Potassium 3.2 Calcium 7.3 Wbc 11.4 Hgb 11.4  TCD 6/1 - continues to show signs of vasospasm.  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 Hypertension Multifactorial encephalopathy due to delirium   -Goal systolic blood pressure reduced to160-180; Titrate phenylephrine accordingly.. -If patient neurological exam or presentation worsens, return goal SBP greater than 180-200 -Continue EVD drainage. -Continue ancef while shunt remains in place -Continue Nimotop -continue AED keppra -Continue to follow clinical exam and TCD - TCD 6/1 - continues to show signs of vasospasm.  Right Lindegaard ratio 3.37 and left Lindegaard ratio 4.73  Persistent fevers Leukocytosis - improving; 6/2 WBC 11.4 On empiric ancef while shunt is in place.  -No hypertension present; but currently on vasopressors -Patient's fevers are likely neurogenic -If patient continues to be febrile, blood cultures and procalcitonin should be considered.  Hyponatremia Hypokalemia -Patient is not hypotensive but is currently on vasopressors. No current clinical evidence of extracellular volume depletion; no hypotension or turgor.  -concern for  cerebral salt wasting.  -6/2 Na 129 And K.  .2 -Maintenance fluids-continue NS 125 ml/hr -increased salt tablet 1g tid per tude  -increased Fludrocortisone to 0.2 mg for CSW -Urea 15 g twice daily -Continue IV fluids monitor.  -Afternoon BMP ordered   -Adult ICU Electrolyte Replacement Protocol -potassium given th through tube and IV.    Best practice (right click and "Reselect all SmartList Selections" daily)  Diet:  per nutrition, cortrak placed 5/27 Pain/Anxiety/Delirium protocol (if indicated): No VAP protocol (if indicated): Not indicated DVT prophylaxis: SCD GI prophylaxis: PPI Glucose control:  SSI No Central venous access:  N/A Arterial line:  N/A Foley:  N/A Mobility:  bed rest  PT consulted: N/A Last date of multidisciplinary goals of care discussion  per primary Code Status:  full code Disposition: ICU  Labs   CBC: Recent Labs  Lab 05/21/21 1236 05/23/21 1032 05/25/21 0255  05/26/21 0547 05/27/21 0601  WBC 11.5* 9.8 14.0* 13.8* 11.4*  NEUTROABS  --   --  10.3* 10.7* 8.7*  HGB 12.3* 12.2* 12.5* 11.0* 11.4*  HCT 36.3* 36.5* 35.6* 31.4* 32.7*  MCV 96.5 97.6 93.0 93.7 95.1  PLT 302 245 249 231 260    Basic Metabolic Panel: Recent Labs  Lab 05/20/21 1133 05/21/21 1236 05/21/21 1236 05/23/21 1032 05/25/21 0255 05/26/21 0547 05/26/21 2229 05/27/21 0601  NA  --  144   < > 140 131* 128* 128* 129*  K  --  3.2*   < > 3.6 3.4* 3.2* 3.7 3.2*  CL  --  113*   < > 109 100 98 94* 101  CO2  --  23   < > 25 24 24 23 22   GLUCOSE  --  88   < > 141* 154* 139* 122* 124*  BUN  --  10   < > 10 7 8 7 7   CREATININE  --  0.59*   < > 0.50* 0.47* 0.46* 0.50* 0.41*  CALCIUM  --  8.1*   < > 8.2* 7.8* 7.4* 7.9* 7.3*  MG 2.5* 2.3  --   --   --   --   --   --   PHOS  --  2.8  --   --   --   --   --   --    < > = values in this interval not displayed.  GFR: Estimated Creatinine Clearance: 87.9 mL/min (A) (by C-G formula based on SCr of 0.41 mg/dL (L)). Recent Labs  Lab 05/23/21 1032 05/25/21 0255 05/26/21 0547 05/27/21 0601  WBC 9.8 14.0* 13.8* 11.4*    Liver Function Tests: Recent Labs  Lab 05/23/21 1032  AST 49*  ALT 42  ALKPHOS 38  BILITOT 0.2*  PROT 5.4*  ALBUMIN 2.6*   No results for input(s): LIPASE, AMYLASE in the last 168 hours. Recent Labs  Lab 05/23/21 1032  AMMONIA 64*    ABG    Component Value Date/Time   HCO3 25.1 (H) 01/24/2008 2351   TCO2 26 01/24/2008 2351   ACIDBASEDEF 1.0 01/24/2008 2351     Coagulation Profile: Recent Labs  Lab 05/23/21 1032  INR 1.1    Cardiac Enzymes: No results for input(s): CKTOTAL, CKMB, CKMBINDEX, TROPONINI in the last 168 hours.  HbA1C: Hemoglobin A1C  Date/Time Value Ref Range Status  12/14/2013 05:30 AM 5.6 4.2 - 6.3 % Final    Comment:    The American Diabetes Association recommends that a primary goal of therapy should be <7% and that physicians should reevaluate the treatment regimen in  patients with HbA1c values consistently >8%.     CBG: Recent Labs  Lab 05/26/21 1527 05/26/21 2003 05/26/21 2323 05/27/21 0336 05/27/21 0744  GLUCAP 147* 132* 144* 144* 127*    Critical care time:     Dennie Bible MS4

## 2021-05-27 NOTE — Progress Notes (Signed)
Pharmacy Electrolyte Replacement  Recent Labs:  Recent Labs    05/27/21 0601  K 3.2*  CREATININE 0.41*    Low Critical Values (K </= 2.5, Phos </= 1, Mg </= 1) Present: None  MD Contacted:   Plan:  - K 3.2 - K runs x 4 + 20 mEq PT x 2 doses - F/u K with AM labs  Thank you for allowing pharmacy to be a part of this patient's care.  Georgina Pillion, PharmD, BCPS Clinical Pharmacist Clinical phone for 05/27/2021: [phone] 05/27/2021 7:44 AM   **Pharmacist phone directory can now be found on [email or web address] ([email or web address]).  Listed under [facility].

## 2021-05-28 ENCOUNTER — Encounter (HOSPITAL_COMMUNITY): Payer: Self-pay | Admitting: Neurosurgery

## 2021-05-28 ENCOUNTER — Inpatient Hospital Stay (HOSPITAL_COMMUNITY): Payer: 59

## 2021-05-28 DIAGNOSIS — E871 Hypo-osmolality and hyponatremia: Secondary | ICD-10-CM

## 2021-05-28 DIAGNOSIS — I608 Other nontraumatic subarachnoid hemorrhage: Secondary | ICD-10-CM

## 2021-05-28 DIAGNOSIS — R197 Diarrhea, unspecified: Secondary | ICD-10-CM | POA: Diagnosis not present

## 2021-05-28 DIAGNOSIS — I67848 Other cerebrovascular vasospasm and vasoconstriction: Secondary | ICD-10-CM | POA: Diagnosis not present

## 2021-05-28 DIAGNOSIS — I609 Nontraumatic subarachnoid hemorrhage, unspecified: Secondary | ICD-10-CM

## 2021-05-28 LAB — CBC WITH DIFFERENTIAL/PLATELET
Abs Immature Granulocytes: 0.04 10*3/uL (ref 0.00–0.07)
Basophils Absolute: 0 10*3/uL (ref 0.0–0.1)
Basophils Relative: 0 %
Eosinophils Absolute: 0.2 10*3/uL (ref 0.0–0.5)
Eosinophils Relative: 2 %
HCT: 32.2 % — ABNORMAL LOW (ref 39.0–52.0)
Hemoglobin: 11.3 g/dL — ABNORMAL LOW (ref 13.0–17.0)
Immature Granulocytes: 0 %
Lymphocytes Relative: 10 %
Lymphs Abs: 1.1 10*3/uL (ref 0.7–4.0)
MCH: 32.8 pg (ref 26.0–34.0)
MCHC: 35.1 g/dL (ref 30.0–36.0)
MCV: 93.6 fL (ref 80.0–100.0)
Monocytes Absolute: 1.3 10*3/uL — ABNORMAL HIGH (ref 0.1–1.0)
Monocytes Relative: 12 %
Neutro Abs: 8.1 10*3/uL — ABNORMAL HIGH (ref 1.7–7.7)
Neutrophils Relative %: 76 %
Platelets: 302 10*3/uL (ref 150–400)
RBC: 3.44 MIL/uL — ABNORMAL LOW (ref 4.22–5.81)
RDW: 11.8 % (ref 11.5–15.5)
WBC: 10.7 10*3/uL — ABNORMAL HIGH (ref 4.0–10.5)
nRBC: 0 % (ref 0.0–0.2)

## 2021-05-28 LAB — BASIC METABOLIC PANEL
Anion gap: 6 (ref 5–15)
BUN: 8 mg/dL (ref 6–20)
CO2: 24 mmol/L (ref 22–32)
Calcium: 8 mg/dL — ABNORMAL LOW (ref 8.9–10.3)
Chloride: 100 mmol/L (ref 98–111)
Creatinine, Ser: 0.35 mg/dL — ABNORMAL LOW (ref 0.61–1.24)
GFR, Estimated: >60 mL/min (ref >60)
Glucose, Bld: 113 mg/dL — ABNORMAL HIGH (ref 70–99)
Potassium: 3.7 mmol/L (ref 3.5–5.1)
Sodium: 130 mmol/L — ABNORMAL LOW (ref 135–145)

## 2021-05-28 LAB — GLUCOSE, CAPILLARY
Glucose-Capillary: 136 mg/dL — ABNORMAL HIGH (ref 70–99)
Glucose-Capillary: 125 mg/dL — ABNORMAL HIGH (ref 70–99)
Glucose-Capillary: 129 mg/dL — ABNORMAL HIGH (ref 70–99)
Glucose-Capillary: 129 mg/dL — ABNORMAL HIGH (ref 70–99)
Glucose-Capillary: 118 mg/dL — ABNORMAL HIGH (ref 70–99)

## 2021-05-28 LAB — MAGNESIUM: Magnesium: 2.2 mg/dL (ref 1.7–2.4)

## 2021-05-28 LAB — PHOSPHORUS: Phosphorus: 2.9 mg/dL (ref 2.5–4.6)

## 2021-05-28 MED ORDER — LOPERAMIDE HCL 1 MG/7.5ML PO SUSP
2.0000 mg | ORAL | Status: DC | PRN
  Administered 2021-05-28 – 2021-05-29 (×3): 2 mg
  Filled 2021-05-28 (×5): qty 15

## 2021-05-28 NOTE — Progress Notes (Signed)
Nutrition Follow-up  DOCUMENTATION CODES:   Not applicable  INTERVENTION:   Initiate tube feeding via Cortrak tube: Jevity 1.2 at 65 ml/h (1560 ml per day)  Provides 1872 kcal, 87 gm protein, 1259 ml free water daily   NUTRITION DIAGNOSIS:   Inadequate oral intake related to inability to eat as evidenced by NPO status. Ongoing.   GOAL:   Patient will meet greater than or equal to 90% of their needs Met with TF.   MONITOR:   Diet advancement,Labs,Weight trends,TF tolerance,Skin,I & O's  REASON FOR ASSESSMENT:   Other (Comment)    ASSESSMENT:   Pt is a 70 you w/ PMH sinusitis, rotator cuff tear, tobacco, marijuana, and ETOH use, remote hx of crack cocaine use who is being managed for intraventricular hemorrhage w/ large acute SAH now s/p ventriculostomy.  Pt discussed during ICU rounds and with RN.  Pt with low grade fevers receiving tylenol and has ice packs  5/27 cortrak placed; tip gastric  Medications reviewed and include: nutrisource fiber BID, protonix, NaCl 1 g tabs TID NS @ 125 ml/hr Phenylephrine @ 165 mcg Labs reviewed: Na 130 CBG's: 125-129  ICP: 167 ml  Diet Order:   Diet Order    None      EDUCATION NEEDS:   Education needs have been addressed  Skin:  Skin Assessment: Skin Integrity Issues: Skin Integrity Issues:: Incisions Incisions: closed rt groin  Last BM:  350 ml via rectal tube  Height:   Ht Readings from Last 1 Encounters:  05/20/21 $RemoveB'5\' 9"'AAlcYXBv$  (1.753 m)    Weight:   Wt Readings from Last 1 Encounters:  05/28/21 66.2 kg    Ideal Body Weight:  72.7 kg  BMI:  Body mass index is 21.55 kg/m.  Estimated Nutritional Needs:   Kcal:  1700-1900  Protein:  85-100 grams  Fluid:  > 1.7 L  Tierra Thoma P., RD, LDN, CNSC See AMiON for contact information

## 2021-05-28 NOTE — Progress Notes (Deleted)
NAME:  [name] [name], MRN:  [id number], DOB:  1961/12/02, LOS: 9 ADMISSION DATE:  05/19/2021, CONSULTATION DATE:  05/20/21 REFERRING MD:  Lisbeth Renshaw CHIEF COMPLAINT:  SAH  History of Present Illness:  Pt is a 59 you w/ PMH sinusitis, rotator cuff tear, tobacco, marijuana, and ETOH use, remote hx of crack cocaine use who is being managed for intraventricular hemorrhage w/ large acute SAH now s/p ventriculostomy.  Pt notable seen in ED for frontal headache and had negative heat CT at that time. Pt spoke with family on Monday. Wednesday did not come to work prompting call to EMS; EMS found minimally responsive at home. PT's mother denies knowledge of any recent trauma.   CT head on admission SAH and CTA showed 3 mm aneurysm from anterior communicating artery thought to have ruptured and caused hemorrhage. Ventriculostomy done at bedside in ED.    Pertinent  Medical History  Sinusitis Tobacco, ETOH, and marijuana use Remote history cocaine use Rotator cuff tear Left toe amputation   Significant Hospital Events: Including procedures, antibiotic start and stop dates in addition to other pertinent events   . 5/25 ventriculostomy . 5/26 coil embolization . Ancef 5/25 -> . 5/29 worsening neurological exam consistent with vasospasm, hemodynamic augmentation initiated. . 6/1 worsening hyponatremia: Na of 120.  Patient started on salt tablets 1 g twice daily and fludrocortisone 0.1 mg. . 6/2 BP goal reduced 160 -180.  Salt Tablet increased to 1 g tid. Fludrocortisone increased 0.2 mg.   Interim history/subjective:  Patient had 1 febrile events overnight.  T-max of 101.2.  Patient WBC trending downwards; currently 10.7.   Neurologic exam for the patient continues to remain similar but is improving. Yesterday, SBP goal reduced to 160 -180. Patient have been more awake. Per nursing, patient does talk to them. Patient does not verbalize to me.   Per nursing, patient's stool have bulked  with addition of fiber but are little loose.   Objective   Blood pressure (!) 169/87, pulse 64, temperature 98.6 F (37 C), temperature source Axillary, resp. rate (!) 26, height 5\' 9"  (1.753 m), weight 66.2 kg, SpO2 100 %.        Intake/Output Summary (Last 24 hours) at 05/28/2021 1129 Last data filed at 05/28/2021 0700 Gross per 24 hour  Intake 4478.65 ml  Output 4950 ml  Net -471.35 ml   Filed Weights   05/27/21 0500 05/28/21 0000 05/28/21 0500  Weight: 62.5 kg 66.2 kg 66.2 kg    Examination: General: Patient in no acute distress.  Patient lying in bed with eyes closed.07/28/21 HENT: EVD with dark red blood  lungs: Patient intermittently tachypneic.  Cough present.  No increased work of breathing. cardiovascular: Regular rate and rhythm; no R/M/G. No JVD Abdomen: Soft, NABS  extremities: Warm and well perfused in all 4 extremities. No edema neuro: Patient awakes to name and opens eyes.  Patient unable to respond verbally. Patient continues respond to noxious stimulus; move all for 4 extremities. Limited ability to follow verbal commands.     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 Hypertension Multifactorial encephalopathy  due to delirium   -Goal systolic blood pressure of 160-180; Titrate phenylephrine accordingly. -Continue EVD drainage. -Continue ancef while shunt remains in place -Continue Nimotop -continue AED keppra -Continue to follow clinical exam and TCD  Persistent  fevers Leukocytosis - improving; 6/3 WBC 10.7 On empiric ancef while shunt is in place.  -No hypertension present; but currently on vasopressors -Patient's fevers are likely neurogenic -If patient continues to be febrile, blood cultures and procalcitonin should be considered.  Hyponatremia - improving  Hypokalemia - improving  -Patient is not hypotensive but is currently on vasopressors. No current clinical evidence of extracellular volume depletion; no hypotension or turgor.  -concern for  cerebral salt wasting.  -6/3 Na 130 and K 3.7 -Maintenance fluids-continue NS 125 ml/hr -continue salt tablet 1g tid per tude  -continue Fludrocortisone to 0.2 mg for CSW -Urea 15 g twice daily -Continue IV fluids monitor.  -Adult ICU Electrolyte Replacement    Best practice (right click and "Reselect all SmartList Selections" daily)  Diet:  per nutrition, cortrak placed 5/27 Pain/Anxiety/Delirium protocol (if indicated): No VAP protocol (if indicated): Not indicated DVT prophylaxis: SCD GI prophylaxis: PPI Glucose control:  SSI No Central venous access:  N/A Arterial line:  N/A Foley:  N/A Mobility:  bed rest  PT consulted: N/A Last date of multidisciplinary goals of care discussion  per primary Code Status:  full code Disposition: ICU  Labs   CBC: Recent Labs  Lab 05/23/21 1032 05/25/21 0255 05/26/21 0547 05/27/21 0601 05/28/21 0534  WBC 9.8 14.0* 13.8* 11.4* 10.7*  NEUTROABS  --  10.3* 10.7* 8.7* 8.1*  HGB 12.2* 12.5* 11.0* 11.4* 11.3*  HCT 36.5* 35.6* 31.4* 32.7* 32.2*  MCV 97.6 93.0 93.7 95.1 93.6  PLT 245 249 231 260 302    Basic Metabolic Panel: Recent Labs  Lab 05/21/21 1236 05/23/21 1032 05/25/21 0255 05/26/21 0547 05/26/21 2229 05/27/21 0601 05/28/21 0849  NA 144   < > 131* 128* 128* 129* 130*  K 3.2*   < > 3.4* 3.2* 3.7 3.2* 3.7  CL 113*   < > 100 98 94* 101 100  CO2 23   < > 24 24 23 22 24   GLUCOSE 88   < > 154* 139* 122* 124* 113*  BUN 10   < > 7 8 7  7 8   CREATININE 0.59*   < > 0.47* 0.46* 0.50* 0.41* 0.35*  CALCIUM 8.1*   < > 7.8* 7.4* 7.9* 7.3* 8.0*  MG 2.3  --   --   --   --   --  2.2  PHOS 2.8  --   --   --   --   --  2.9   < > = values in this interval not displayed.   GFR: Estimated Creatinine Clearance: 93.1 mL/min (A) (by C-G formula based on SCr of 0.35 mg/dL (L)). Recent Labs  Lab 05/25/21 0255 05/26/21 0547 05/27/21 0601 05/28/21 0534  WBC 14.0* 13.8* 11.4* 10.7*    Liver Function Tests: Recent Labs  Lab 05/23/21 1032  AST 49*  ALT 42  ALKPHOS 38  BILITOT 0.2*  PROT 5.4*  ALBUMIN 2.6*   No results for input(s): LIPASE, AMYLASE in the last 168 hours. Recent Labs  Lab 05/23/21 1032  AMMONIA 64*    ABG    Component Value Date/Time   HCO3 25.1 (H) 01/24/2008 2351   TCO2 26 01/24/2008 2351   ACIDBASEDEF 1.0 01/24/2008 2351     Coagulation Profile: Recent Labs  Lab  05/23/21 1032  INR 1.1    Cardiac Enzymes: No results for input(s): CKTOTAL, CKMB, CKMBINDEX, TROPONINI in the last 168 hours.  HbA1C: Hemoglobin A1C  Date/Time Value Ref Range Status  12/14/2013 05:30 AM 5.6 4.2 - 6.3 % Final    Comment:    The American Diabetes Association recommends that a primary goal of therapy should be <7% and that physicians should reevaluate the treatment regimen in patients with HbA1c values consistently >8%.     CBG: Recent Labs  Lab 05/27/21 1545 05/27/21 1940 05/27/21 2325 05/28/21 0320 05/28/21 0754  GLUCAP 134* 120* 135* 136* 125*    Critical care time:     Dennie Bible MS4

## 2021-05-28 NOTE — Progress Notes (Signed)
NAME:  TERRIS GERMANO, MRN:  [id number], DOB:  1961-08-27, LOS: 9 ADMISSION DATE:  05/19/2021, CONSULTATION DATE:  05/20/21 REFERRING MD:  Lisbeth Renshaw CHIEF COMPLAINT:  SAH  History of Present Illness:  Pt is a 88 you w/ PMH sinusitis, rotator cuff tear, tobacco, marijuana, and ETOH use, remote hx of crack cocaine use who is being managed for intraventricular hemorrhage w/ large acute SAH now s/p ventriculostomy.  Pt notable seen in ED for frontal headache and had negative heat CT at that time. Pt spoke with family on Monday. Wednesday did not come to work prompting call to EMS; EMS found minimally responsive at home. PT's mother denies knowledge of any recent trauma.   CT head on admission SAH and CTA showed 3 mm aneurysm from anterior communicating artery thought to have ruptured and caused hemorrhage. Ventriculostomy done at bedside in ED.    Pertinent  Medical History  Sinusitis Tobacco, ETOH, and marijuana use Remote history cocaine use Rotator cuff tear Left toe amputation   Significant Hospital Events: Including procedures, antibiotic start and stop dates in addition to other pertinent events   . 5/25 ventriculostomy . 5/26 coil embolization . Ancef 5/25 -> . 5/29 worsening neurological exam consistent with vasospasm, hemodynamic augmentation initiated. . 6/1 worsening hyponatremia: Na of 120.  Patient started on salt tablets 1 g twice daily and fludrocortisone 0.1 mg. . 6/2 BP goal reduced 160 -180.  Salt Tablet increased to 1 g tid. Fludrocortisone increased 0.2 mg.   Interim history/subjective:  Patient had 1 febrile events overnight.  T-max of 101.2.  Patient WBC trending downwards; currently 10.7.   Neurologic exam for the patient continues to remain similar but is improving. Yesterday, SBP goal reduced to 160 -180. Patient have been more awake. Per nursing, patient does talk to them. Patient does not verbalize to me.   Per nursing, patient's stool have bulked  with addition of fiber but are little loose.   Objective   Blood pressure (!) 169/87, pulse 64, temperature 98.6 F (37 C), temperature source Axillary, resp. rate (!) 26, height 5\' 9"  (1.753 m), weight 66.2 kg, SpO2 100 %.        Intake/Output Summary (Last 24 hours) at 05/28/2021 1132 Last data filed at 05/28/2021 0700 Gross per 24 hour  Intake 4478.65 ml  Output 4950 ml  Net -471.35 ml   Filed Weights   05/27/21 0500 05/28/21 0000 05/28/21 0500  Weight: 62.5 kg 66.2 kg 66.2 kg    Examination: General: Patient in no acute distress.  Patient lying in bed with eyes closed.07/28/21 HENT: EVD with dark red blood  lungs: Patient intermittently tachypneic.  Cough present.  No increased work of breathing. cardiovascular: Regular rate and rhythm; no R/M/G. No JVD Abdomen: Soft, NABS  extremities: Warm and well perfused in all 4 extremities. No edema neuro: Patient awakes to name and opens eyes.  Patient unable to respond verbally. Patient continues respond to noxious stimulus; move all for 4 extremities. Limited ability to follow verbal commands.     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 Hypertension Multifactorial encephalopathy  due to delirium   -Goal systolic blood pressure of 160-180; Titrate phenylephrine accordingly. -Continue EVD drainage. -Continue ancef while shunt remains in place -Continue Nimotop -continue AED keppra -Continue to follow clinical exam and TCD  Persistent  fevers Leukocytosis - improving; 6/3 WBC 10.7 On empiric ancef while shunt is in place.  -No hypertension present; but currently on vasopressors -Patient's fevers are likely neurogenic -If patient continues to be febrile, blood cultures and procalcitonin should be considered.  Hyponatremia - improving  Hypokalemia - improving  -Patient is not hypotensive but is currently on vasopressors. No current clinical evidence of extracellular volume depletion; no hypotension or turgor.  -concern for  cerebral salt wasting.  -6/3 Na 130 and K 3.7 -Maintenance fluids-continue NS 125 ml/hr -continue salt tablet 1g tid per tude  -continue Fludrocortisone to 0.2 mg for CSW -Urea 15 g twice daily -Continue IV fluids monitor.  -Adult ICU Electrolyte Replacement   Diarrhea  - Loperamide 1 mg/7.5 ml suspension 2 mg   - Nutrisource fiber   Best practice (right click and "Reselect all SmartList Selections" daily)  Diet:  per nutrition, cortrak placed 5/27 Pain/Anxiety/Delirium protocol (if indicated): No VAP protocol (if indicated): Not indicated DVT prophylaxis: SCD GI prophylaxis: PPI Glucose control:  SSI No Central venous access:  N/A Arterial line:  N/A Foley:  N/A Mobility:  bed rest  PT consulted: N/A Last date of multidisciplinary goals of care discussion  per primary Code Status:  full code Disposition: ICU  Labs   CBC: Recent Labs  Lab 05/23/21 1032 05/25/21 0255 05/26/21 0547 05/27/21 0601 05/28/21 0534  WBC 9.8 14.0* 13.8* 11.4* 10.7*  NEUTROABS  --  10.3* 10.7* 8.7* 8.1*  HGB 12.2* 12.5* 11.0* 11.4* 11.3*  HCT 36.5* 35.6* 31.4* 32.7* 32.2*  MCV 97.6 93.0 93.7 95.1 93.6  PLT 245 249 231 260 302    Basic Metabolic Panel: Recent Labs  Lab 05/21/21 1236 05/23/21 1032 05/25/21 0255 05/26/21 0547 05/26/21 2229 05/27/21 0601 05/28/21 0849  NA 144   < > 131* 128* 128* 129* 130*  K 3.2*   < > 3.4* 3.2* 3.7 3.2* 3.7  CL 113*   < > 100 98 94* 101 100  CO2 23   < >  24 24 23 22 24   GLUCOSE 88   < > 154* 139* 122* 124* 113*  BUN 10   < > 7 8 7 7 8   CREATININE 0.59*   < > 0.47* 0.46* 0.50* 0.41* 0.35*  CALCIUM 8.1*   < > 7.8* 7.4* 7.9* 7.3* 8.0*  MG 2.3  --   --   --   --   --  2.2  PHOS 2.8  --   --   --   --   --  2.9   < > = values in this interval not displayed.   GFR: Estimated Creatinine Clearance: 93.1 mL/min (A) (by C-G formula based on SCr of 0.35 mg/dL (L)). Recent Labs  Lab 05/25/21 0255 05/26/21 0547 05/27/21 0601 05/28/21 0534  WBC 14.0* 13.8* 11.4* 10.7*    Liver Function Tests: Recent Labs  Lab 05/23/21 1032  AST 49*  ALT 42  ALKPHOS 38  BILITOT 0.2*  PROT 5.4*  ALBUMIN 2.6*   No results for input(s): LIPASE, AMYLASE in the last 168 hours. Recent Labs  Lab 05/23/21 1032  AMMONIA 64*    ABG    Component Value Date/Time   HCO3 25.1 (H) 01/24/2008 2351   TCO2 26 01/24/2008 2351  ACIDBASEDEF 1.0 01/24/2008 2351     Coagulation Profile: Recent Labs  Lab 05/23/21 1032  INR 1.1    Cardiac Enzymes: No results for input(s): CKTOTAL, CKMB, CKMBINDEX, TROPONINI in the last 168 hours.  HbA1C: Hemoglobin A1C  Date/Time Value Ref Range Status  12/14/2013 05:30 AM 5.6 4.2 - 6.3 % Final    Comment:    The American Diabetes Association recommends that a primary goal of therapy should be <7% and that physicians should reevaluate the treatment regimen in patients with HbA1c values consistently >8%.     CBG: Recent Labs  Lab 05/27/21 1545 05/27/21 1940 05/27/21 2325 05/28/21 0320 05/28/21 0754  GLUCAP 134* 120* 135* 136* 125*    Critical care time:     Dennie Bible MS4

## 2021-05-28 NOTE — Progress Notes (Signed)
  NEUROSURGERY PROGRESS NOTE   Pt seen and examined. No issues overnight.   EXAM: Temp:  [98.5 F (36.9 C)-101.2 F (38.4 C)] 98.6 F (37 C) (06/03 0800) Pulse Rate:  [61-181] 64 (06/03 0709) Resp:  [14-32] 26 (06/03 0709) BP: (129-197)/(76-150) 169/87 (06/03 0709) SpO2:  [18 %-100 %] 100 % (06/03 0709) Weight:  [66.2 kg] 66.2 kg (06/03 0500) Intake/Output      06/02 0701 06/03 0700 06/03 0701 06/04 0700   I.V. (mL/kg) 3376 (51)    NG/GT 1985    IV Piggyback 250.1    Total Intake(mL/kg) 5611.1 (84.8)    Urine (mL/kg/hr) 4500 (2.8)    Drains 167    Stool 350    Total Output 5017    Net +594.1          Awake, alert,  verbalizes some responses Intermittently FC Moving BUE/BLE  EVD in place, open @ 5, bloody CSF  LABS: Lab Results  Component Value Date   CREATININE 0.35 (L) 05/28/2021   BUN 8 05/28/2021   NA 130 (L) 05/28/2021   K 3.7 05/28/2021   CL 100 05/28/2021   CO2 24 05/28/2021   Lab Results  Component Value Date   WBC 10.7 (H) 05/28/2021   HGB 11.3 (L) 05/28/2021   HCT 32.2 (L) 05/28/2021   MCV 93.6 05/28/2021   PLT 302 05/28/2021    IMPRESSION: - 60 y.o. male SAH d#10 (could be later given visits to ED for HA prior to admission) with symptomatic spasm but clinically appears to be improving. Likely a component of delerium in addition to spasm - Likely CSW (Na 129)  PLAN: - cont hyperdynamic treatment with SBP goal > and IVF hydration through the weekend(pt exam seems improved with SBP down to 160 rather than 180 as previous) - Monitor Na - on fluorinef - Cont Nimotop and TCD monitoring - Will d/c ancef for EVD prophylaxis - Likely begin to wean EVD next week as peak spasm window closes  Lisbeth Renshaw, MD [facility]

## 2021-05-28 NOTE — Progress Notes (Addendum)
Date POD PCO2 HCT BP  MCA ACA PCA OPHT SIPH VERT Basilar  5/27 MS     Right  Left   154  158   -54  -37   21  25   28  21    *  *   -21  -28     -65    5/30 MS     Right  Left   135  142   -38  -148   46  20   17  25   21   47   *  *   *      6/1 JP     Right  Left   128  156   -144  -55   29  30   22  20    *  *   -52  -53   -58       6/3 rs     Right  Left   155  166   *  *   30  54   *  *   *  *   *  *     *          Right  Left                                            Right  Left                                            Right  Left                                        MCA = Middle Cerebral Artery OPHT = Opthalmic Artery BASILAR = Basilar Artery  ACA = Anterior Cerebral Artery SIPH = Carotid Siphon PCA = Posterior Cerebral Artery VERT = Verterbral Artery   Normal MCA = 62+\-12 ACA = 50+\-12 PCA = 42+\-23   (*) not insonated due to patient constant movement. 6/30, BS, RDMS, RVT

## 2021-05-29 DIAGNOSIS — R4182 Altered mental status, unspecified: Secondary | ICD-10-CM | POA: Diagnosis not present

## 2021-05-29 LAB — GLUCOSE, CAPILLARY
Glucose-Capillary: 118 mg/dL — ABNORMAL HIGH (ref 70–99)
Glucose-Capillary: 132 mg/dL — ABNORMAL HIGH (ref 70–99)
Glucose-Capillary: 134 mg/dL — ABNORMAL HIGH (ref 70–99)
Glucose-Capillary: 136 mg/dL — ABNORMAL HIGH (ref 70–99)
Glucose-Capillary: 138 mg/dL — ABNORMAL HIGH (ref 70–99)

## 2021-05-29 LAB — CBC WITH DIFFERENTIAL/PLATELET
Abs Immature Granulocytes: 0.06 10*3/uL (ref 0.00–0.07)
Basophils Absolute: 0.1 10*3/uL (ref 0.0–0.1)
Basophils Relative: 1 %
Eosinophils Absolute: 0.3 10*3/uL (ref 0.0–0.5)
Eosinophils Relative: 2 %
HCT: 34.6 % — ABNORMAL LOW (ref 39.0–52.0)
Hemoglobin: 12.2 g/dL — ABNORMAL LOW (ref 13.0–17.0)
Immature Granulocytes: 1 %
Lymphocytes Relative: 9 %
Lymphs Abs: 1.2 10*3/uL (ref 0.7–4.0)
MCH: 33.2 pg (ref 26.0–34.0)
MCHC: 35.3 g/dL (ref 30.0–36.0)
MCV: 94 fL (ref 80.0–100.0)
Monocytes Absolute: 1.4 10*3/uL — ABNORMAL HIGH (ref 0.1–1.0)
Monocytes Relative: 11 %
Neutro Abs: 9.8 10*3/uL — ABNORMAL HIGH (ref 1.7–7.7)
Neutrophils Relative %: 76 %
Platelets: 373 10*3/uL (ref 150–400)
RBC: 3.68 MIL/uL — ABNORMAL LOW (ref 4.22–5.81)
RDW: 11.8 % (ref 11.5–15.5)
WBC: 12.8 10*3/uL — ABNORMAL HIGH (ref 4.0–10.5)
nRBC: 0 % (ref 0.0–0.2)

## 2021-05-29 MED ORDER — NIMODIPINE 30 MG PO CAPS: 30.0000 mg | ORAL_CAPSULE | ORAL | Status: DC

## 2021-05-29 MED ORDER — NIMODIPINE 6 MG/ML PO SOLN
30.0000 mg | ORAL | Status: DC
  Administered 2021-05-29 – 2021-05-30 (×10): 30 mg
  Filled 2021-05-29 (×7): qty 10

## 2021-05-29 NOTE — Progress Notes (Signed)
NAME:  [name] [name], MRN:  [id number], DOB:  06-09-1961, LOS: 10 ADMISSION DATE:  05/19/2021, CONSULTATION DATE:  05/20/21 REFERRING MD:  Lisbeth Renshaw CHIEF COMPLAINT:  SAH  History of Present Illness:  Pt is a 64 you w/ PMH sinusitis, rotator cuff tear, tobacco, marijuana, and ETOH use, remote hx of crack cocaine use who is being managed for intraventricular hemorrhage w/ large acute SAH now s/p ventriculostomy.  Pt notable seen in ED for frontal headache and had negative heat CT at that time. Pt spoke with family on Monday. Wednesday did not come to work prompting call to EMS; EMS found minimally responsive at home. PT's mother denies knowledge of any recent trauma.   CT head on admission SAH and CTA showed 3 mm aneurysm from anterior communicating artery thought to have ruptured and caused hemorrhage. Ventriculostomy done at bedside in ED.    Pertinent  Medical History  Sinusitis Tobacco, ETOH, and marijuana use Remote history cocaine use Rotator cuff tear Left toe amputation   Significant Hospital Events: Including procedures, antibiotic start and stop dates in addition to other pertinent events   . 5/25 ventriculostomy . 5/26 coil embolization . Ancef 5/25 -> . 5/29 worsening neurological exam consistent with vasospasm, hemodynamic augmentation initiated. . 6/1 worsening hyponatremia: Na of 120.  Patient started on salt tablets 1 g twice daily and fludrocortisone 0.1 mg. . 6/2 BP goal reduced 160 -180.  Salt Tablet increased to 1 g tid. Fludrocortisone increased 0.2 mg.   Interim history/subjective:  6/4: pt still req pressor for appropriate BP 160-180 more so with nimotop dosing so this was changed to q2h.. rn reports verbally responsive often. On my exam pt acknowledges me but doesn't verbally respond. He is purposeful. He was successful on speech eval but will maintain cortrak for now.   Objective   Blood pressure (!) 162/78, pulse 69, temperature 98.4 F (36.9 C),  temperature source Axillary, resp. rate (!) 24, height 5\' 9"  (1.753 m), weight 66.4 kg, SpO2 100 %.        Intake/Output Summary (Last 24 hours) at 05/29/2021 0920 Last data filed at 05/29/2021 0800 Gross per 24 hour  Intake 5479.23 ml  Output 4353 ml  Net 1126.23 ml   Filed Weights   05/28/21 0000 05/28/21 0500 05/29/21 0500  Weight: 66.2 kg 66.2 kg 66.4 kg    Examination: General: Patient in no acute distress.  Sitting up in bed watching tv HENT: EVD with some bloody drainage lungs: clear, Cough present.  No increased work of breathing. cardiovascular: Regular rate and rhythm; no R/M/G. No JVD Abdomen: Soft, NABS  extremities: Warm and well perfused in all 4 extremities. No edema neuro: Patient awake. 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 Hypertension Multifactorial encephalopathy due to delirium   -Goal systolic blood pressure of 160-180; Titrate phenylephrine accordingly. -Continue EVD drainage. -Continue Nimotop but with altered dosing -continue AED keppra -Continue to follow clinical exam and TCD per neurosx  Persistent fevers Leukocytosis - improving; 6/3 WBC 10.7 -neurosx stopped ancef for evd prophy -  No hypertension present; but currently on vasopressors -Patient's fevers are likely neurogenic -If patient continues to be febrile, blood cultures and procalcitonin should be considered.  Hyponatremia - improving   Hypokalemia - improving  -Patient is not hypotensive but is currently on vasopressors. No current clinical evidence of extracellular volume depletion; no hypotension or turgor.  -concern for  cerebral salt wasting.  -6/3 Na 130  -Maintenance fluids-continue NS 125 ml/hr -continue salt tablet 1g tid per tube -continue Fludrocortisone to 0.2 mg for CSW -Urea 15 g twice daily -Continue IV fluids monitor.  -Adult ICU Electrolyte Replacement   Diarrhea  - Loperamide 1 mg/7.5 ml suspension 2 mg   - Nutrisource fiber   Best practice (right click and "Reselect all SmartList Selections" daily)  Diet:  per nutrition, cortrak placed 5/27 will maintain but advance diet per speech recs Pain/Anxiety/Delirium protocol (if indicated): No VAP protocol (if indicated): Not indicated DVT prophylaxis: SCD GI prophylaxis: PPI Glucose control:  SSI No Central venous access:  yes via picc Arterial line:  N/A Foley:  N/A Mobility:  bed rest  PT consulted: N/A Last date of multidisciplinary goals of care discussion  per primary Code Status:  full code Disposition: ICU  Labs   CBC: Recent Labs  Lab 05/25/21 0255 05/26/21 0547 05/27/21 0601 05/28/21 0534 05/29/21 0500  WBC 14.0* 13.8* 11.4* 10.7* 12.8*  NEUTROABS 10.3* 10.7* 8.7* 8.1* 9.8*  HGB 12.5* 11.0* 11.4* 11.3* 12.2*  HCT 35.6* 31.4* 32.7* 32.2* 34.6*  MCV 93.0 93.7 95.1 93.6 94.0  PLT 249 231 260 302 373    Basic Metabolic Panel: Recent Labs  Lab 05/25/21 0255 05/26/21 0547 05/26/21 2229 05/27/21 0601 05/28/21 0849  NA 131* 128* 128* 129* 130*  K 3.4* 3.2* 3.7 3.2* 3.7  CL 100 98 94* 101 100  CO2 24 24 23 22 24   GLUCOSE 154* 139* 122* 124* 113*  BUN 7 8 7 7 8   CREATININE 0.47* 0.46* 0.50* 0.41* 0.35*  CALCIUM 7.8* 7.4* 7.9* 7.3* 8.0*  MG  --   --   --   --  2.2  PHOS  --   --   --   --  2.9   GFR: Estimated Creatinine Clearance: 93.4 mL/min (A) (by C-G formula based on SCr of 0.35 mg/dL (L)). Recent Labs  Lab  05/26/21 0547 05/27/21 0601 05/28/21 0534 05/29/21 0500  WBC 13.8* 11.4* 10.7* 12.8*    Liver Function Tests: Recent Labs  Lab 05/23/21 1032  AST 49*  ALT 42  ALKPHOS 38  BILITOT 0.2*  PROT 5.4*  ALBUMIN 2.6*   No results for input(s): LIPASE, AMYLASE in the last 168 hours. Recent Labs  Lab 05/23/21 1032  AMMONIA 64*    ABG    Component Value Date/Time   HCO3 25.1 (H) 01/24/2008 2351   TCO2 26 01/24/2008 2351   ACIDBASEDEF 1.0 01/24/2008 2351     Coagulation Profile: Recent Labs  Lab 05/23/21 1032  INR 1.1    Cardiac Enzymes: No results for input(s): CKTOTAL, CKMB, CKMBINDEX, TROPONINI in the last 168 hours.  HbA1C: Hemoglobin A1C  Date/Time Value Ref Range Status  12/14/2013 05:30 AM 5.6 4.2 - 6.3 % Final    Comment:    The American Diabetes Association recommends that a primary goal of therapy should be <7% and that physicians should reevaluate the treatment regimen in patients with HbA1c values consistently >8%.     CBG: Recent Labs  Lab 05/28/21 1132 05/28/21 1523 05/28/21 2039 05/29/21 0033  05/29/21 0804  GLUCAP 129* 129* 118* 138* 118*    Critical care time: The patient is critically ill with multiple organ systems failure and requires high complexity decision making for assessment and support, frequent evaluation and titration of therapies, application of advanced monitoring technologies and extensive interpretation of multiple databases.  Critical care time 35 mins. This represents my time independent of the NPs time taking care of the pt. This is excluding procedures.    Briant Sites DO [facility] 05/29/2021, 9:20 AM See Amion for pager If no response to pager, please call [phone] until 1900 After 1900 please call [facility] [phone]

## 2021-05-29 NOTE — Evaluation (Signed)
Clinical/Bedside Swallow Evaluation Patient Details  Name: [name] [name] MRN: [id number] Date of Birth: Jun 06, 1961  Today's Date: 05/29/2021 Time: SLP Start Time (ACUTE ONLY): 1100 SLP Stop Time (ACUTE ONLY): 1115 SLP Time Calculation (min) (ACUTE ONLY): 15 min  Past Medical History:  Past Medical History:  Diagnosis Date  . Alcohol abuse   . Drug abuse (HCC)   . Right rotator cuff tear   . Seasonal allergies    Past Surgical History:  Past Surgical History:  Procedure Laterality Date  . AMPUTATION Left 05/30/2019   Procedure: REVISION AMPUTATION OF L INDEX FINGER;  Surgeon: Betha Loa, MD;  Location: [facility] OR;  Service: Orthopedics;  Laterality: Left;  . ANEURYSM COILING  05/19/2021   Anterior communicating aneurysm  . IR ANGIO INTRA EXTRACRAN SEL INTERNAL CAROTID UNI R MOD SED  05/20/2021  . IR ANGIOGRAM FOLLOW UP STUDY  05/20/2021  . IR TRANSCATH/EMBOLIZ  05/20/2021  . RADIOLOGY WITH ANESTHESIA N/A 05/20/2021   Procedure: IR WITH ANESTHESIA;  Surgeon: Lisbeth Renshaw, MD;  Location: [facility] OR;  Service: Radiology;  Laterality: N/A;  . SHOULDER ARTHROSCOPY WITH OPEN ROTATOR CUFF REPAIR AND DISTAL CLAVICLE ACROMINECTOMY Right 07/03/2020   Procedure: SHOULDER ARTHROSCOPY WITH OPEN ROTATOR CUFF REPAIR AND DISTAL CLAVICLE ARTHROSCOPIC DEBRIDEMENT;  Surgeon: Frederico Hamman, MD;  Location: [facility];  Service: Orthopedics;  Laterality: Right;  . SHOULDER CLOSED REDUCTION Right 10/28/2020   Procedure: CLOSED MANIPULATION SHOULDER;  Surgeon: Frederico Hamman, MD;  Location: [facility];  Service: Orthopedics;  Laterality: Right;  . toe amputaion Left yrs ago   HPI:  Pt is a 67 you w/ PMH sinusitis, rotator cuff tear, tobacco, marijuana, and ETOH use, remote hx of crack cocaine use who is being managed for intraventricular hemorrhage w/ large acute SAH now s/p ventriculostomy.  Pt was briefly intubated on 5/26 for procedure.   Assessment / Plan /  Recommendation Clinical Impression  Pt was seen for a bedside swallow evaluation in the setting of intraventricular hemorrhage with large acute SAH.  He was encountered asleep in bed and he roused to moderate verbal and tactile stimulation.  He was noted to have bilateral mitts and soft wrist restraints with Cortrak and ventricular drain in place.  Attempted oral mechanism examination; however, pt was unable to complete secondary to inability to follow commands. Pt was seen with trials of thin liquid and puree.  Pt exhibited an immediate, strong cough with first siphoned straw sip of thin liquid, but no additional clinical s/sx of aspiration were observed with additional 10 trials of thin liquid.  Pt was intermittently able to draw liquid from the straw independently, but otherwise required siphoned straw, likely secondary to cognitive deficits.  Pt with suspected timely AP transit of puree trials with no overt s/sx of aspiration.  Recommend iniation of Dysphagia 1 (puree) solids and thin liquids with meds crushed in puree and full supervision/assistance with meals to cue for compensatory strategies.  Would recommend delaying removal of Cortrak for at least 24 hours to determine if pt is able to tolerate PO diet without need for supplemental nutrition.  SLP will f/u to monitor diet tolerance and advance as able.  Patient will benefit from a comprehensive cognitive-linguistic evaluation in the setting of intraventricular hemorrhage with large acute SAH.  MD please order if you agree.    SLP Visit Diagnosis: Dysphagia, unspecified (R13.10)    Aspiration Risk  Mild aspiration risk    Diet Recommendation Dysphagia 1 (Puree);Thin liquid   Liquid  Administration via: Straw;Cup Medication Administration: Crushed with puree Supervision: Full supervision/cueing for compensatory strategies Compensations: Minimize environmental distractions;Slow rate;Small sips/bites    Other  Recommendations Oral Care  Recommendations: Oral care QID;Staff/trained caregiver to provide oral care   Follow up Recommendations Skilled Nursing facility      Frequency and Duration min 2x/week          Prognosis Prognosis for Safe Diet Advancement: Fair Barriers to Reach Goals: Cognitive deficits;Language deficits      Swallow Study   General HPI: Pt is a 66 you w/ PMH sinusitis, rotator cuff tear, tobacco, marijuana, and ETOH use, remote hx of crack cocaine use who is being managed for intraventricular hemorrhage w/ large acute SAH now s/p ventriculostomy.  Pt was briefly intubated on 5/26 for procedure. Type of Study: Bedside Swallow Evaluation Previous Swallow Assessment: N/A Diet Prior to this Study: NPO;NG Tube Temperature Spikes Noted: Yes Respiratory Status: Room air History of Recent Intubation: Yes Length of Intubations (days):  (<1 for procedure) Date extubated: 05/20/21 Behavior/Cognition: Alert;Requires cueing Oral Cavity Assessment: Within Functional Limits Oral Care Completed by SLP: No Oral Cavity - Dentition: Other (Comment) (difficult to assess secondary to inability to follow commands) Self-Feeding Abilities: Needs assist Patient Positioning: Upright in bed Baseline Vocal Quality: Not observed Volitional Cough: Strong Volitional Swallow: Unable to elicit    Oral/Motor/Sensory Function Overall Oral Motor/Sensory Function: Other (comment) (Unable to assess given inability to follow commands)   Ice Chips Ice chips: Not tested   Thin Liquid Thin Liquid: Impaired Presentation: Spoon;Straw Oral Phase Impairments: Poor awareness of bolus Pharyngeal  Phase Impairments: Cough - Immediate    Nectar Thick Nectar Thick Liquid: Not tested   Honey Thick Honey Thick Liquid: Not tested   Puree Puree: Within functional limits   Solid     Solid: Not tested     Villa Herb M.S., CCC-SLP Acute Rehabilitation Services Office: [phone]  Shanon Rosser Ransom Nickson 05/29/2021,11:37 AM

## 2021-05-29 NOTE — Evaluation (Addendum)
Physical Therapy Evaluation Patient Details Name: [name] [name] MRN: [id number] DOB: 17-Dec-1961 Today's Date: 05/29/2021   History of Present Illness  Pt is a 60 y.o. M who was admitted to ICU 5/25 with IVH with large acute SAH now s/p ventriculostomy. 5/26 coil embolization. 5/29 worsening neurological exam consistent with vasospasm. Significant PMH: rotator cuff tear, tobacco, marijuana and ETOH use, remote hx crack cocaine use.  Clinical Impression  Prior to admission, pt was independent and ambulatory. Pt demonstrating flat affect, decreased verbalizations and restlessness. He follows 50-75% 1 step commands, including "thumbs up," "high five," with both upper extremities. Pt requiring two person mod-max assist for functional mobility. Able to stand from edge of bed, but unable to weight shift to take steps. BP sitting edge of bed 164/132 (pt moving arm) and supine post mobility 137/99 (RN aware). Demonstrates gross weakness, poor sitting balance, decreased cognition. Given PLOF and age, recommend CIR to address deficits and maximize functional mobility.     Follow Up Recommendations CIR    Equipment Recommendations  Wheelchair (measurements PT);3in1 (PT);Wheelchair cushion (measurements PT)    Recommendations for Other Services Rehab consult;OT consult     Precautions / Restrictions Precautions Precautions: Fall;Other (comment) Precaution Comments: EVD, BP 160-180, wrist restraints, mittens Restrictions Weight Bearing Restrictions: No      Mobility  Bed Mobility Overal bed mobility: Needs Assistance Bed Mobility: Supine to Sit;Sit to Supine     Supine to sit: Max assist;+2 for physical assistance Sit to supine: Max assist;+2 for physical assistance   General bed mobility comments: Decreased initiation, maxA + 2 for BLE and trunk management    Transfers Overall transfer level: Needs assistance Equipment used: None Transfers: Sit to/from Stand Sit to Stand: +2 physical  assistance;Mod assist         General transfer comment: ModA + 2 to rise to stand from edge of bed, increased trunk/hip flexion  Ambulation/Gait                Stairs            Wheelchair Mobility    Modified Rankin (Stroke Patients Only) Modified Rankin (Stroke Patients Only) Pre-Morbid Rankin Score: No symptoms Modified Rankin: Severe disability     Balance Overall balance assessment: Needs assistance Sitting-balance support: Feet supported Sitting balance-Leahy Scale: Poor Sitting balance - Comments: Requiring modA, posterior lean Postural control: Posterior lean Standing balance support: Bilateral upper extremity supported Standing balance-Leahy Scale: Poor Standing balance comment: reliant on external support                             Pertinent Vitals/Pain Pain Assessment: Faces Faces Pain Scale: No hurt    Home Living Family/patient expects to be discharged to:: Unsure                 Additional Comments: No family/caregiver present to determine home set up    Prior Function Level of Independence: Independent               Hand Dominance        Extremity/Trunk Assessment   Upper Extremity Assessment Upper Extremity Assessment: Defer to OT evaluation    Lower Extremity Assessment Lower Extremity Assessment: Difficult to assess due to impaired cognition;RLE deficits/detail;LLE deficits/detail RLE Deficits / Details: At least 3/5 strength LLE Deficits / Details: Toe amputation. Would not actively move against gravity on edge of bed, but noted to be bent when supine.  Cervical / Trunk Assessment Cervical / Trunk Assessment: Normal  Communication   Communication: Expressive difficulties  Cognition Arousal/Alertness: Awake/alert Behavior During Therapy: Flat affect;Restless Overall Cognitive Status: Impaired/Different from baseline Area of Impairment: Following commands;Awareness;Safety/judgement;Problem  solving                       Following Commands: Follows one step commands inconsistently Safety/Judgement: Decreased awareness of safety;Decreased awareness of deficits Awareness: Intellectual Problem Solving: Decreased initiation;Requires verbal cues General Comments: Pt alert, visually tracking, startles to name being called. Pt very restless and fidgety, with restraints removed, he would not try to actively pull any lines/drains, however, he would frequently reach up to scratch drain site and needed to be redirected. Able to verbalize a couple times, i.e. "Ok," and according to [employer] has used Hotel manager. Follows > 50% of 1 step commands, able to give high five, thumbs up, move extremities intermittently to command      General Comments      Exercises     Assessment/Plan    PT Assessment Patient needs continued PT services  PT Problem List Decreased strength;Decreased activity tolerance;Decreased balance;Decreased mobility;Decreased cognition;Decreased safety awareness       PT Treatment Interventions DME instruction;Gait training;Functional mobility training;Therapeutic activities;Therapeutic exercise;Balance training;Patient/family education    PT Goals (Current goals can be found in the Care Plan section)  Acute Rehab PT Goals Patient Stated Goal: unable PT Goal Formulation: With patient Time For Goal Achievement: 06/12/21 Potential to Achieve Goals: Good    Frequency Min 4X/week   Barriers to discharge        Co-evaluation               AM-PAC PT "6 Clicks" Mobility  Outcome Measure Help needed turning from your back to your side while in a flat bed without using bedrails?: A Lot Help needed moving from lying on your back to sitting on the side of a flat bed without using bedrails?: A Lot Help needed moving to and from a bed to a chair (including a wheelchair)?: A Lot Help needed standing up from a chair using your arms (e.g., wheelchair or bedside  chair)?: Total Help needed to walk in hospital room?: Total Help needed climbing 3-5 steps with a railing? : Total 6 Click Score: 9    End of Session Equipment Utilized During Treatment: Gait belt Activity Tolerance: Patient tolerated treatment well Patient left: in bed;with bed alarm set;with call bell/phone within reach;with restraints reapplied Nurse Communication: Mobility status PT Visit Diagnosis: Unsteadiness on feet (R26.81);Muscle weakness (generalized) (M62.81);Difficulty in walking, not elsewhere classified (R26.2)    Time: 7619-5093 PT Time Calculation (min) (ACUTE ONLY): 30 min   Charges:   PT Evaluation $PT Eval Moderate Complexity: 1 Mod PT Treatments $Therapeutic Activity: 8-22 mins        Lillia Pauls, PT, DPT Acute Rehabilitation Services Pager [phone] Office [phone]   Norval Morton 05/29/2021, 1:55 PM

## 2021-05-29 NOTE — Progress Notes (Signed)
Subjective: Patient reports No complaints this morning  Objective: Vital signs in last 24 hours: Temp:  [98.4 F (36.9 C)-99.9 F (37.7 C)] 99.9 F (37.7 C) (06/04 0000) Pulse Rate:  [60-102] 71 (06/04 0730) Resp:  [14-27] 19 (06/04 0730) BP: (122-206)/(69-145) 145/130 (06/04 0730) SpO2:  [98 %-100 %] 100 % (06/04 0730) Weight:  [66.4 kg] 66.4 kg (06/04 0500)  Intake/Output from previous day: 06/03 0701 - 06/04 0700 In: 5197.2 [I.V.:3637.2; NG/GT:1560] Out: 3785 [Urine:3400; Drains:185; Stool:200] Intake/Output this shift: Total I/O In: -  Out: 600 [Urine:600]  Awake move everything appears symmetrically drain seems to be functioning well  Lab Results: Recent Labs    05/28/21 0534 05/29/21 0500  WBC 10.7* 12.8*  HGB 11.3* 12.2*  HCT 32.2* 34.6*  PLT 302 373   BMET Recent Labs    05/27/21 0601 05/28/21 0849  NA 129* 130*  K 3.2* 3.7  CL 101 100  CO2 22 24  GLUCOSE 124* 113*  BUN 7 8  CREATININE 0.41* 0.35*  CALCIUM 7.3* 8.0*    Studies/Results: VAS [place] TRANSCRANIAL DOPPLER  Result Date: 05/28/2021  Transcranial Doppler Patient Name:  [name] [name]  Date of Exam:   05/28/2021 Medical Rec #: [id number]         Accession #:    [id number] Date of Birth: March 11, 1961         Patient Gender: M Patient Age:   60Y Exam Location:  [facility] Procedure:      VAS [place] TRANSCRANIAL DOPPLER Referring Phys: [id number] Donalee Citrin --------------------------------------------------------------------------------  Indications: Subarachnoid hemorrhage. 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 Limitations: Patient movement throughout the exam Limitations for diagnostic windows: Unable to insonate right transtemporal window. Unable to insonate left transtemporal window. Unable to insonate occipital window. Performing Technologist: Marilynne Halsted RDMS, RVT  Examination Guidelines: A complete evaluation includes B-mode imaging, spectral Doppler, color Doppler, and  power Doppler as needed of all accessible portions of each vessel. Bilateral testing is considered an integral part of a complete examination. Limited examinations for reoccurring indications may be performed as noted.  +----------+-------------+----------+-----------+-------------+ RIGHT TCD Right VM (cm)Depth (cm)Pulsatility   Comment    +----------+-------------+----------+-----------+-------------+ MCA          155.00       5.60      0.71                  +----------+-------------+----------+-----------+-------------+ ACA                                         not insonated +----------+-------------+----------+-----------+-------------+ Term ICA                                    not insonated +----------+-------------+----------+-----------+-------------+ PCA           30.00                 0.70                  +----------+-------------+----------+-----------+-------------+ Opthalmic                                   not insonated +----------+-------------+----------+-----------+-------------+ ICA siphon  not insonated +----------+-------------+----------+-----------+-------------+ Vertebral                                   not insonated +----------+-------------+----------+-----------+-------------+ Distal ICA                                  not insonated +----------+-------------+----------+-----------+-------------+  +----------+------------+----------+-----------+-------------+ LEFT TCD  Left VM (cm)Depth (cm)Pulsatility   Comment    +----------+------------+----------+-----------+-------------+ MCA          166.00      4.20      0.81                  +----------+------------+----------+-----------+-------------+ ACA                                        not insonated +----------+------------+----------+-----------+-------------+ Term ICA                                   not insonated  +----------+------------+----------+-----------+-------------+ PCA          54.00                 0.83                  +----------+------------+----------+-----------+-------------+ Opthalmic                                  not insonated +----------+------------+----------+-----------+-------------+ ICA siphon                                 not insonated +----------+------------+----------+-----------+-------------+ Vertebral                                  not insonated +----------+------------+----------+-----------+-------------+ Distal ICA                                 not insonated +----------+------------+----------+-----------+-------------+  +------------+-------+-------------+             VM cm/s   Comment    +------------+-------+-------------+ Prox Basilar       not insonated +------------+-------+-------------+ Dist Basilar       not insonated +------------+-------+-------------+ Summary:  patient movement limited today's study. only b/l MCA and PCA were able to recorded. with this limited data, still has elevated b/l MCA MVFs, not significantly changed from prior, still suggesting moderate vasospasm b/l MCAs. clinical correlation recommended. *See table(s) above for TCD measurements and observations.  Diagnosing physician: Marvel Plan MD Electronically signed by Marvel Plan MD on 05/28/2021 at 2:46:59 PM.    Final     Assessment/Plan: Most recent sodium 130 yesterday improved pending this morning.  Drain functioning well continue EVD for now large swings in blood pressure when given minimal edema Topol divide the dose.  LOS: 10 days     [name] 05/29/2021, 8:23 AM

## 2021-05-30 DIAGNOSIS — R4182 Altered mental status, unspecified: Secondary | ICD-10-CM | POA: Diagnosis not present

## 2021-05-30 LAB — GLUCOSE, CAPILLARY
Glucose-Capillary: 112 mg/dL — ABNORMAL HIGH (ref 70–99)
Glucose-Capillary: 117 mg/dL — ABNORMAL HIGH (ref 70–99)
Glucose-Capillary: 121 mg/dL — ABNORMAL HIGH (ref 70–99)
Glucose-Capillary: 122 mg/dL — ABNORMAL HIGH (ref 70–99)
Glucose-Capillary: 129 mg/dL — ABNORMAL HIGH (ref 70–99)
Glucose-Capillary: 137 mg/dL — ABNORMAL HIGH (ref 70–99)

## 2021-05-30 LAB — CBC WITH DIFFERENTIAL/PLATELET
Abs Immature Granulocytes: 0.09 10*3/uL — ABNORMAL HIGH (ref 0.00–0.07)
Basophils Absolute: 0.1 10*3/uL (ref 0.0–0.1)
Basophils Relative: 0 %
Eosinophils Absolute: 0.3 10*3/uL (ref 0.0–0.5)
Eosinophils Relative: 2 %
HCT: 33.4 % — ABNORMAL LOW (ref 39.0–52.0)
Hemoglobin: 11.7 g/dL — ABNORMAL LOW (ref 13.0–17.0)
Immature Granulocytes: 1 %
Lymphocytes Relative: 9 %
Lymphs Abs: 1.3 10*3/uL (ref 0.7–4.0)
MCH: 33 pg (ref 26.0–34.0)
MCHC: 35 g/dL (ref 30.0–36.0)
MCV: 94.1 fL (ref 80.0–100.0)
Monocytes Absolute: 1.5 10*3/uL — ABNORMAL HIGH (ref 0.1–1.0)
Monocytes Relative: 10 %
Neutro Abs: 11.4 10*3/uL — ABNORMAL HIGH (ref 1.7–7.7)
Neutrophils Relative %: 78 %
Platelets: 411 10*3/uL — ABNORMAL HIGH (ref 150–400)
RBC: 3.55 MIL/uL — ABNORMAL LOW (ref 4.22–5.81)
RDW: 11.9 % (ref 11.5–15.5)
WBC: 14.5 10*3/uL — ABNORMAL HIGH (ref 4.0–10.5)
nRBC: 0 % (ref 0.0–0.2)

## 2021-05-30 LAB — COMPREHENSIVE METABOLIC PANEL
ALT: 159 U/L — ABNORMAL HIGH (ref 0–44)
AST: 107 U/L — ABNORMAL HIGH (ref 15–41)
Albumin: 2.9 g/dL — ABNORMAL LOW (ref 3.5–5.0)
Alkaline Phosphatase: 63 U/L (ref 38–126)
Anion gap: 10 (ref 5–15)
BUN: 6 mg/dL (ref 6–20)
CO2: 24 mmol/L (ref 22–32)
Calcium: 8.8 mg/dL — ABNORMAL LOW (ref 8.9–10.3)
Chloride: 97 mmol/L — ABNORMAL LOW (ref 98–111)
Creatinine, Ser: 0.45 mg/dL — ABNORMAL LOW (ref 0.61–1.24)
GFR, Estimated: >60 mL/min (ref >60)
Glucose, Bld: 157 mg/dL — ABNORMAL HIGH (ref 70–99)
Potassium: 3.1 mmol/L — ABNORMAL LOW (ref 3.5–5.1)
Sodium: 131 mmol/L — ABNORMAL LOW (ref 135–145)
Total Bilirubin: 0.4 mg/dL (ref 0.3–1.2)
Total Protein: 5.8 g/dL — ABNORMAL LOW (ref 6.5–8.1)

## 2021-05-30 LAB — MAGNESIUM: Magnesium: 1.9 mg/dL (ref 1.7–2.4)

## 2021-05-30 MED ORDER — POTASSIUM CHLORIDE 20 MEQ PO PACK
20.0000 meq | PACK | ORAL | Status: AC
  Administered 2021-05-30 (×2): 20 meq
  Filled 2021-05-30 (×2): qty 1

## 2021-05-30 MED ORDER — NIMODIPINE 30 MG PO CAPS: 30.0000 mg | ORAL_CAPSULE | ORAL | Status: AC

## 2021-05-30 MED ORDER — NIMODIPINE 6 MG/ML PO SOLN
30.0000 mg | ORAL | Status: AC
  Administered 2021-05-30 – 2021-06-04 (×59): 30 mg
  Filled 2021-05-30 (×31): qty 10

## 2021-05-30 MED ORDER — SODIUM CHLORIDE 1 G PO TABS
2.0000 g | ORAL_TABLET | Freq: Three times a day (TID) | ORAL | Status: DC
  Administered 2021-05-30 – 2021-06-08 (×27): 2 g
  Filled 2021-05-30 (×27): qty 2

## 2021-05-30 MED ORDER — POTASSIUM CHLORIDE 10 MEQ/50ML IV SOLN
10.0000 meq | INTRAVENOUS | Status: AC
  Administered 2021-05-30 (×4): 10 meq via INTRAVENOUS
  Filled 2021-05-30 (×4): qty 50

## 2021-05-30 NOTE — Progress Notes (Signed)
K+ 3.1 °Replaced per protocol  °

## 2021-05-30 NOTE — Progress Notes (Signed)
SLP Cancellation Note  Patient Details Name: [name] [name] MRN: [id number] DOB: [birth year]   Cancelled treatment:       Reason Eval/Treat Not Completed: Patient declined, no reason specified. Patient refused to participate, closed eyes, no verbal responses. Per RN, he has been refusing PO's. Will attempt to see patient next date schedule permitting for dysphagia tx.   Angela Nevin, MA, CCC-SLP Speech Therapy [facility] Acute Rehab

## 2021-05-30 NOTE — Progress Notes (Signed)
NAME:  [name] [name], MRN:  [id number], DOB:  09-09-1961, LOS: 11 ADMISSION DATE:  05/19/2021, CONSULTATION DATE:  05/20/21 REFERRING MD:  Lisbeth Renshaw CHIEF COMPLAINT:  SAH  History of Present Illness:  Pt is a 20 you w/ PMH sinusitis, rotator cuff tear, tobacco, marijuana, and ETOH use, remote hx of crack cocaine use who is being managed for intraventricular hemorrhage w/ large acute SAH now s/p ventriculostomy.  Pt notable seen in ED for frontal headache and had negative heat CT at that time. Pt spoke with family on Monday. Wednesday did not come to work prompting call to EMS; EMS found minimally responsive at home. PT's mother denies knowledge of any recent trauma.   CT head on admission SAH and CTA showed 3 mm aneurysm from anterior communicating artery thought to have ruptured and caused hemorrhage. Ventriculostomy done at bedside in ED.    Pertinent  Medical History  Sinusitis Tobacco, ETOH, and marijuana use Remote history cocaine use Rotator cuff tear Left toe amputation   Significant Hospital Events: Including procedures, antibiotic start and stop dates in addition to other pertinent events   . 5/25 ventriculostomy . 5/26 coil embolization . Ancef 5/25 -> . 5/29 worsening neurological exam consistent with vasospasm, hemodynamic augmentation initiated. . 6/1 worsening hyponatremia: Na of 120.  Patient started on salt tablets 1 g twice daily and fludrocortisone 0.1 mg. . 6/2 BP goal reduced 160 -180.  Salt Tablet increased to 1 g tid. Fludrocortisone increased 0.2 mg.   Interim history/subjective:  6/5: no acute events overnight reported. Did have tmax 100.8 (off abx as of 6/3) Remains on pressors for goal BP. Still with large volume uop sodium marginally improved at 131. Replacing e-lytes a sable. Cont to match uop with concern for CSW 6/4: pt still req pressor for appropriate BP 160-180 more so with nimotop dosing so this was changed to q2h.. rn reports verbally  responsive often. On my exam pt acknowledges me but doesn't verbally respond. He is purposeful. He was successful on speech eval but will maintain cortrak for now.   Objective   Blood pressure (!) 186/87, pulse 70, temperature 98.6 F (37 C), temperature source Oral, resp. rate 16, height 5\' 9"  (1.753 m), weight 65.9 kg, SpO2 98 %.        Intake/Output Summary (Last 24 hours) at 05/30/2021 0832 Last data filed at 05/30/2021 0800 Gross per 24 hour  Intake 5355.72 ml  Output 4369 ml  Net 986.72 ml   Filed Weights   05/28/21 0500 05/29/21 0500 05/30/21 0400  Weight: 66.2 kg 66.4 kg 65.9 kg    Examination: General: Patient in no acute distress. Laying sideways in bed, awake HENT: EVD with some bloody drainage lungs: clear,no increased wob cardiovascular: Regular rate and rhythm; no R/M/G. No JVD Abdomen: Soft, NABS  extremities: Warm and well perfused in all 4 extremities. No edema neuro: Patient awake. 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 Hypertension Multifactorial encephalopathy due to delirium   -Goal systolic blood pressure of 160-180; Titrate phenylephrine accordingly. -Continue EVD  drainage. -Continue Nimotop but with altered dosing -continue AED keppra per neurosx -Continue to follow clinical exam and TCD per neurosx  Persistent fevers Leukocytosis - improving; 6/3 WBC 10.7 -neurosx stopped ancef for evd prophy -No hypertension  present; but currently on vasopressors -Patient's fevers are likely neurogenic -If patient continues to be febrile, blood cultures and procalcitonin should be considered.  Hyponatremia - improving  Hypokalemia  -Patient is not hypotensive but is currently on vasopressors. No current clinical evidence of extracellular volume depletion; no hypotension or turgor.  -concern for  cerebral salt wasting.  -6/5 Na 131  -Maintenance fluids-continue NS 125 ml/hr -increase salt tablet to 2g tid per tube -continue Fludrocortisone to 0.2 mg for CSW -Continue IV fluids monitor.  -Adult ICU Electrolyte Replacement  -check mag with hypokalemia   Diarrhea  - Loperamide 1 mg/7.5 ml suspension 2 mg   - Nutrisource fiber   Best practice (right click and "Reselect all SmartList Selections" daily)  Diet:  per nutrition, cortrak placed 5/27 will maintain but advance diet per speech recs Pain/Anxiety/Delirium protocol (if indicated): No VAP protocol (if indicated): Not indicated DVT prophylaxis: SCD GI prophylaxis: PPI Glucose control:  SSI No Central venous access:  yes via picc Arterial line:  N/A Foley:  N/A Mobility:  bed rest  PT consulted: N/A Last date of multidisciplinary goals of care discussion  per primary Code Status:  full code Disposition: ICU  Labs   CBC: Recent Labs  Lab 05/26/21 0547 05/27/21 0601 05/28/21 0534 05/29/21 0500 05/30/21 0411  WBC 13.8* 11.4* 10.7* 12.8* 14.5*  NEUTROABS 10.7* 8.7* 8.1* 9.8* 11.4*  HGB 11.0* 11.4* 11.3* 12.2* 11.7*  HCT 31.4* 32.7* 32.2* 34.6* 33.4*  MCV 93.7 95.1 93.6 94.0 94.1  PLT 231 260 302 373 411*    Basic Metabolic Panel: Recent Labs  Lab 05/26/21 0547 05/26/21 2229 05/27/21 0601 05/28/21 0849 05/30/21 0411  NA 128* 128* 129* 130* 131*  K 3.2* 3.7 3.2* 3.7 3.1*  CL 98 94* 101 100 97*  CO2 24 23 22 24 24   GLUCOSE 139* 122* 124* 113* 157*  BUN 8 7 7 8 6   CREATININE 0.46* 0.50* 0.41* 0.35* 0.45*  CALCIUM 7.4* 7.9* 7.3*  8.0* 8.8*  MG  --   --   --  2.2  --   PHOS  --   --   --  2.9  --    GFR: Estimated Creatinine Clearance: 92.7 mL/min (A) (by C-G formula based on SCr of 0.45 mg/dL (L)). Recent Labs  Lab 05/27/21 0601 05/28/21 0534 05/29/21 0500 05/30/21 0411  WBC 11.4* 10.7* 12.8* 14.5*    Liver Function Tests: Recent Labs  Lab 05/23/21 1032 05/30/21 0411  AST 49* 107*  ALT 42 159*  ALKPHOS 38 63  BILITOT 0.2* 0.4  PROT 5.4* 5.8*  ALBUMIN 2.6* 2.9*   No results for input(s): LIPASE, AMYLASE in the last 168 hours. Recent Labs  Lab 05/23/21 1032  AMMONIA 64*    ABG    Component Value Date/Time   HCO3 25.1 (H) 01/24/2008 2351   TCO2 26 01/24/2008 2351   ACIDBASEDEF 1.0 01/24/2008 2351     Coagulation Profile: Recent Labs  Lab 05/23/21 1032  INR 1.1    Cardiac Enzymes: No results for input(s): CKTOTAL, CKMB, CKMBINDEX, TROPONINI in the last 168 hours.  HbA1C: Hemoglobin A1C  Date/Time Value Ref Range Status  12/14/2013 05:30 AM 5.6 4.2 - 6.3 % Final    Comment:    The American Diabetes  Association recommends that a primary goal of therapy should be <7% and that physicians should reevaluate the treatment regimen in patients with HbA1c values consistently >8%.     CBG: Recent Labs  Lab 05/29/21 1148 05/29/21 1910 05/29/21 2315 05/30/21 0323 05/30/21 0802  GLUCAP 134* 132* 136* 129* 122*    Critical care time: The patient is critically ill with multiple organ systems failure and requires high complexity decision making for assessment and support, frequent evaluation and titration of therapies, application of advanced monitoring technologies and extensive interpretation of multiple databases.  Critical care time 35 mins. This represents my time independent of the NPs time taking care of the pt. This is excluding procedures.    Briant Sites DO [facility] 05/30/2021, 8:32 AM See Amion for pager If no response to pager, please call  [phone] until 1900 After 1900 please call [facility] [phone]

## 2021-05-30 NOTE — Progress Notes (Signed)
Inpatient Rehab Admissions Coordinator Note:   Per PT recommendation, pt was screened for CIR candidacy by Wolfgang Phoenix, MS, CCC-SLP.  At this time we are recommending an inpatient rehab consult. Please place an IP Rehab MD consult order if pt would like to be considered.  Please contact me with questions.     Wolfgang Phoenix, MS, CCC-SLP Admissions Coordinator [phone] 05/30/21 10:01 AM

## 2021-05-30 NOTE — Progress Notes (Signed)
Subjective: Patient reports No complaints denies any significant headache  Objective: Vital signs in last 24 hours: Temp:  [98.3 F (36.8 C)-100.8 F (38.2 C)] 98.5 F (36.9 C) (06/05 0800) Pulse Rate:  [54-93] 70 (06/05 0600) Resp:  [12-30] 16 (06/05 0600) BP: (91-204)/(63-162) 186/87 (06/05 0600) SpO2:  [90 %-100 %] 98 % (06/05 0600) Weight:  [65.9 kg] 65.9 kg (06/05 0400)  Intake/Output from previous day: 06/04 0701 - 06/05 0700 In: 5572.7 [I.V.:3678.6; NG/GT:1830; IV Piggyback:34.1] Out: 4958 [Urine:4600; Drains:158; Stool:200] Intake/Output this shift: Total I/O In: -  Out: 11 [Drains:11]  Confused but moves all extremities follow commands equal strength  Lab Results: Recent Labs    05/29/21 0500 05/30/21 0411  WBC 12.8* 14.5*  HGB 12.2* 11.7*  HCT 34.6* 33.4*  PLT 373 411*   BMET Recent Labs    05/28/21 0849 05/30/21 0411  NA 130* 131*  K 3.7 3.1*  CL 100 97*  CO2 24 24  GLUCOSE 113* 157*  BUN 8 6  CREATININE 0.35* 0.45*  CALCIUM 8.0* 8.8*    Studies/Results: VAS [place] TRANSCRANIAL DOPPLER  Result Date: 05/28/2021  Transcranial Doppler Patient Name:  [name] [name]  Date of Exam:   05/28/2021 Medical Rec #: [id number]         Accession #:    [id number] Date of Birth: 05/07/1961         Patient Gender: M Patient Age:   60Y Exam Location:  [facility] Procedure:      VAS [place] TRANSCRANIAL DOPPLER Referring Phys: [id number] Donalee Citrin --------------------------------------------------------------------------------  Indications: Subarachnoid hemorrhage. 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 Limitations: Patient movement throughout the exam Limitations for diagnostic windows: Unable to insonate right transtemporal window. Unable to insonate left transtemporal window. Unable to insonate occipital window. Performing Technologist: Marilynne Halsted RDMS, RVT  Examination Guidelines: A complete evaluation includes B-mode imaging, spectral  Doppler, color Doppler, and power Doppler as needed of all accessible portions of each vessel. Bilateral testing is considered an integral part of a complete examination. Limited examinations for reoccurring indications may be performed as noted.  +----------+-------------+----------+-----------+-------------+ RIGHT TCD Right VM (cm)Depth (cm)Pulsatility   Comment    +----------+-------------+----------+-----------+-------------+ MCA          155.00       5.60      0.71                  +----------+-------------+----------+-----------+-------------+ ACA                                         not insonated +----------+-------------+----------+-----------+-------------+ Term ICA                                    not insonated +----------+-------------+----------+-----------+-------------+ PCA           30.00                 0.70                  +----------+-------------+----------+-----------+-------------+ Opthalmic                                   not insonated +----------+-------------+----------+-----------+-------------+ ICA siphon  not insonated +----------+-------------+----------+-----------+-------------+ Vertebral                                   not insonated +----------+-------------+----------+-----------+-------------+ Distal ICA                                  not insonated +----------+-------------+----------+-----------+-------------+  +----------+------------+----------+-----------+-------------+ LEFT TCD  Left VM (cm)Depth (cm)Pulsatility   Comment    +----------+------------+----------+-----------+-------------+ MCA          166.00      4.20      0.81                  +----------+------------+----------+-----------+-------------+ ACA                                        not insonated +----------+------------+----------+-----------+-------------+ Term ICA                                    not insonated +----------+------------+----------+-----------+-------------+ PCA          54.00                 0.83                  +----------+------------+----------+-----------+-------------+ Opthalmic                                  not insonated +----------+------------+----------+-----------+-------------+ ICA siphon                                 not insonated +----------+------------+----------+-----------+-------------+ Vertebral                                  not insonated +----------+------------+----------+-----------+-------------+ Distal ICA                                 not insonated +----------+------------+----------+-----------+-------------+  +------------+-------+-------------+             VM cm/s   Comment    +------------+-------+-------------+ Prox Basilar       not insonated +------------+-------+-------------+ Dist Basilar       not insonated +------------+-------+-------------+ Summary:  patient movement limited today's study. only b/l MCA and PCA were able to recorded. with this limited data, still has elevated b/l MCA MVFs, not significantly changed from prior, still suggesting moderate vasospasm b/l MCAs. clinical correlation recommended. *See table(s) above for TCD measurements and observations.  Diagnosing physician: Marvel Plan MD Electronically signed by Marvel Plan MD on 05/28/2021 at 2:46:59 PM.    Final     Assessment/Plan: Post EVD day 3 doing well drain functioning well continue drainage for now observation in the ICU  LOS: 11 days     [name] [name] 05/30/2021, 8:38 AM

## 2021-05-31 ENCOUNTER — Inpatient Hospital Stay (HOSPITAL_COMMUNITY): Payer: 59

## 2021-05-31 DIAGNOSIS — I67848 Other cerebrovascular vasospasm and vasoconstriction: Secondary | ICD-10-CM

## 2021-05-31 DIAGNOSIS — I609 Nontraumatic subarachnoid hemorrhage, unspecified: Secondary | ICD-10-CM

## 2021-05-31 DIAGNOSIS — E871 Hypo-osmolality and hyponatremia: Secondary | ICD-10-CM

## 2021-05-31 LAB — CBC WITH DIFFERENTIAL/PLATELET
Abs Immature Granulocytes: 0.09 10*3/uL — ABNORMAL HIGH (ref 0.00–0.07)
Basophils Absolute: 0.1 10*3/uL (ref 0.0–0.1)
Basophils Relative: 1 %
Eosinophils Absolute: 0.3 10*3/uL (ref 0.0–0.5)
Eosinophils Relative: 2 %
HCT: 35.9 % — ABNORMAL LOW (ref 39.0–52.0)
Hemoglobin: 12.4 g/dL — ABNORMAL LOW (ref 13.0–17.0)
Immature Granulocytes: 1 %
Lymphocytes Relative: 12 %
Lymphs Abs: 1.5 10*3/uL (ref 0.7–4.0)
MCH: 33.2 pg (ref 26.0–34.0)
MCHC: 34.5 g/dL (ref 30.0–36.0)
MCV: 96.2 fL (ref 80.0–100.0)
Monocytes Absolute: 1.3 10*3/uL — ABNORMAL HIGH (ref 0.1–1.0)
Monocytes Relative: 10 %
Neutro Abs: 9.6 10*3/uL — ABNORMAL HIGH (ref 1.7–7.7)
Neutrophils Relative %: 74 %
Platelets: 496 10*3/uL — ABNORMAL HIGH (ref 150–400)
RBC: 3.73 MIL/uL — ABNORMAL LOW (ref 4.22–5.81)
RDW: 12.2 % (ref 11.5–15.5)
WBC: 12.9 10*3/uL — ABNORMAL HIGH (ref 4.0–10.5)
nRBC: 0 % (ref 0.0–0.2)

## 2021-05-31 LAB — COMPREHENSIVE METABOLIC PANEL
ALT: 135 U/L — ABNORMAL HIGH (ref 0–44)
AST: 67 U/L — ABNORMAL HIGH (ref 15–41)
Albumin: 3 g/dL — ABNORMAL LOW (ref 3.5–5.0)
Alkaline Phosphatase: 69 U/L (ref 38–126)
Anion gap: 9 (ref 5–15)
BUN: 6 mg/dL (ref 6–20)
CO2: 26 mmol/L (ref 22–32)
Calcium: 9 mg/dL (ref 8.9–10.3)
Chloride: 97 mmol/L — ABNORMAL LOW (ref 98–111)
Creatinine, Ser: 0.4 mg/dL — ABNORMAL LOW (ref 0.61–1.24)
GFR, Estimated: >60 mL/min (ref >60)
Glucose, Bld: 147 mg/dL — ABNORMAL HIGH (ref 70–99)
Potassium: 3.8 mmol/L (ref 3.5–5.1)
Sodium: 132 mmol/L — ABNORMAL LOW (ref 135–145)
Total Bilirubin: 0.5 mg/dL (ref 0.3–1.2)
Total Protein: 6.2 g/dL — ABNORMAL LOW (ref 6.5–8.1)

## 2021-05-31 LAB — GLUCOSE, CAPILLARY
Glucose-Capillary: 140 mg/dL — ABNORMAL HIGH (ref 70–99)
Glucose-Capillary: 142 mg/dL — ABNORMAL HIGH (ref 70–99)
Glucose-Capillary: 113 mg/dL — ABNORMAL HIGH (ref 70–99)
Glucose-Capillary: 97 mg/dL (ref 70–99)
Glucose-Capillary: 120 mg/dL — ABNORMAL HIGH (ref 70–99)
Glucose-Capillary: 171 mg/dL — ABNORMAL HIGH (ref 70–99)

## 2021-05-31 NOTE — Progress Notes (Signed)
Physical Therapy Treatment Patient Details Name: [name] [name] MRN: [id number] DOB: 1961-11-20 Today's Date: 05/31/2021    History of Present Illness Pt is a 60 y.o. M who was admitted to ICU 5/25 with IVH with large acute SAH now s/p ventriculostomy. 5/26 coil embolization. 5/29 worsening neurological exam consistent with vasospasm. Significant PMH: rotator cuff tear, tobacco, marijuana and ETOH use, remote hx crack cocaine use.    PT Comments    Pt progressing towards his physical therapy goals; demonstrates good activity tolerance throughout session with focus on sitting/dynamic sitting balance and functional mobility. Pt following 25-50% of commands today, able to verbalize one word responses intermittently. Requiring two person mod-max assist for functional mobility. Standing from edge of bed x 2, clearing hips from bed, but unable to achieve upright posture today. BP 152/128 sitting edge of bed. Pt demonstrates left inattention. Will continue to progress mobility as tolerated.     Follow Up Recommendations  CIR     Equipment Recommendations  Wheelchair (measurements PT);3in1 (PT);Wheelchair cushion (measurements PT)    Recommendations for Other Services       Precautions / Restrictions Precautions Precautions: Fall;Other (comment) Precaution Comments: EVD, BP > 160, wrist restraints, mittens Restrictions Weight Bearing Restrictions: No    Mobility  Bed Mobility Overal bed mobility: Needs Assistance Bed Mobility: Supine to Sit;Sit to Supine     Supine to sit: Mod assist;+2 for physical assistance;+2 for safety/equipment Sit to supine: Max assist;+2 for physical assistance   General bed mobility comments: Pt with decreased initiation and requires mulitmodal cues to accomplish task.    Transfers Overall transfer level: Needs assistance Equipment used: None Transfers: Sit to/from Stand Sit to Stand: Max assist;+2 physical assistance         General transfer  comment: maxA +2 similar to squat pivot to achieve position closer to [facility] x2 times; unable to achieve full standing today; R knee flexed and pt not able to keep RLE on floor for balance despite knee block  Ambulation/Gait                 Stairs             Wheelchair Mobility    Modified Rankin (Stroke Patients Only)       Balance Overall balance assessment: Needs assistance Sitting-balance support: Feet supported Sitting balance-Leahy Scale: Poor Sitting balance - Comments: minA overall with cues Postural control: Posterior lean Standing balance support: Bilateral upper extremity supported Standing balance-Leahy Scale: Poor Standing balance comment: reliant on external support; RLE having difficulty maintaining contact with floor today.                            Cognition Arousal/Alertness: Awake/alert Behavior During Therapy: Flat affect;Restless Overall Cognitive Status: Impaired/Different from baseline Area of Impairment: Following commands;Awareness;Safety/judgement;Problem solving                       Following Commands: Follows one step commands inconsistently Safety/Judgement: Decreased awareness of safety;Decreased awareness of deficits Awareness: Intellectual Problem Solving: Decreased initiation;Requires verbal cues General Comments: Pt alert, easily startled. Pt following 25-50% of commands today. Pt looking at hand when asked to "show me your thumb," "give me a thumbs up" and wiggling thumb. Pt discovered in bed whole body to L side of bed and head turned to L. Able to verbalize      Exercises      General Comments General comments (skin integrity,  edema, etc.): O2 >100% on RA; HR <100 BPM and BP reading unable to get accurate reading due to pt moving arm constantly 157/128 at EOB.      Pertinent Vitals/Pain Pain Assessment: Faces Faces Pain Scale: No hurt Pain Intervention(s): Monitored during session    Home Living  Family/patient expects to be discharged to:: Unsure               Additional Comments: No family/caregiver present to determine home set up    Prior Function Level of Independence: Independent          PT Goals (current goals can now be found in the care plan section) Acute Rehab PT Goals Patient Stated Goal: unable Potential to Achieve Goals: Fair Progress towards PT goals: Progressing toward goals    Frequency    Min 4X/week      PT Plan Current plan remains appropriate    Co-evaluation PT/OT/SLP Co-Evaluation/Treatment: Yes Reason for Co-Treatment: Complexity of the patient's impairments (multi-system involvement);For patient/therapist safety;To address functional/ADL transfers;Necessary to address cognition/behavior during functional activity PT goals addressed during session: Mobility/safety with mobility;Balance OT goals addressed during session: ADL's and self-care      AM-PAC PT "6 Clicks" Mobility   Outcome Measure  Help needed turning from your back to your side while in a flat bed without using bedrails?: A Lot Help needed moving from lying on your back to sitting on the side of a flat bed without using bedrails?: A Lot Help needed moving to and from a bed to a chair (including a wheelchair)?: Total Help needed standing up from a chair using your arms (e.g., wheelchair or bedside chair)?: Total Help needed to walk in hospital room?: Total Help needed climbing 3-5 steps with a railing? : Total 6 Click Score: 8    End of Session Equipment Utilized During Treatment: Gait belt Activity Tolerance: Patient tolerated treatment well Patient left: in bed;with bed alarm set;with call bell/phone within reach;with restraints reapplied Nurse Communication: Mobility status PT Visit Diagnosis: Unsteadiness on feet (R26.81);Muscle weakness (generalized) (M62.81);Difficulty in walking, not elsewhere classified (R26.2)     Time: 4431-5400 PT Time Calculation (min)  (ACUTE ONLY): 35 min  Charges:  $Therapeutic Activity: 8-22 mins                     [name] [name], PT, DPT Acute Rehabilitation Services Pager [phone] Office [phone]    [name] [name] 05/31/2021, 11:25 AM

## 2021-05-31 NOTE — Progress Notes (Signed)
  Speech Language Pathology Treatment: Dysphagia  Patient Details Name: [name] [name] MRN: [id number] DOB: Sep 07, 1961 Today's Date: 05/31/2021 Time: 1600-1610 SLP Time Calculation (min) (ACUTE ONLY): 10 min  Assessment / Plan / Recommendation Clinical Impression  [name] [name] was awake with little eye contact and decreased focused attention. Hand over and feeding with therapist providing most assistance. Due to confusion and cognitive state he bit straw but soon able to coordinate extraction with unremarkable subjective observation and propelled pudding bolus posteriorly. Puree is appropriate texture, continue thin liquids, crush meds. Will work with pt on higher textures solids.    HPI HPI: Pt is a 41 you w/ PMH sinusitis, rotator cuff tear, tobacco, marijuana, and ETOH use, remote hx of crack cocaine use who is being managed for intraventricular hemorrhage w/ large acute SAH now s/p ventriculostomy.  Pt was briefly intubated on 5/26 for procedure.      SLP Plan  Continue with current plan of care       Recommendations  Diet recommendations: Dysphagia 1 (puree);Thin liquid Liquids provided via: Cup;Straw Medication Administration: Crushed with puree Supervision: Full supervision/cueing for compensatory strategies;Staff to assist with self feeding Compensations: Minimize environmental distractions;Slow rate;Small sips/bites Postural Changes and/or Swallow Maneuvers: Seated upright 90 degrees                Oral Care Recommendations: Oral care QID;Staff/trained caregiver to provide oral care Follow up Recommendations: Skilled Nursing facility SLP Visit Diagnosis: Dysphagia, unspecified (R13.10) Plan: Continue with current plan of care       GO                [name] [name] 05/31/2021, 4:24 PM [name] [name] [name] [name].Ed Nurse, children's [phone] Office [phone]

## 2021-05-31 NOTE — Evaluation (Addendum)
Occupational Therapy Evaluation Patient Details Name: [name] [name] MRN: [id number] DOB: 25-Jan-1961 Today's Date: 05/31/2021    History of Present Illness Pt is a 60 y.o. M who was admitted to ICU 5/25 with IVH with large acute SAH now s/p ventriculostomy. 5/26 coil embolization. 5/29 worsening neurological exam consistent with vasospasm. Significant PMH: rotator cuff tear, tobacco, marijuana and ETOH use, remote hx crack cocaine use.   Clinical Impression   Pt PTA: Pt not able to obtain data. Pt currently, limited by decreased cognition/command follow, L inattention, decreased ability to perform ADL and mobility safely. Pt alert, easily startled. Pt following 25-50% of commands today. Pt ModA +2 for supine to sit;  maxA+2 for sit to stand, clearing bed, but not able to acheive fully upright posture x2 times squat pivot to HOB. Pt modA for feeding self today. Possible visual deficits on L side or more inattention. Pt would benefit from continued OT skilled services. OT following acutely. Vitals:O2 >100% on RA; HR <100 BPM and BP reading unable to get accurate reading due to pt moving arm constantly 157/128 at EOB.    Follow Up Recommendations  CIR    Equipment Recommendations  3 in 1 bedside commode    Recommendations for Other Services Rehab consult     Precautions / Restrictions Precautions Precautions: Fall;Other (comment) Precaution Comments: EVD, BP 160-180, wrist restraints, mittens Restrictions Weight Bearing Restrictions: No      Mobility Bed Mobility Overal bed mobility: Needs Assistance Bed Mobility: Supine to Sit;Sit to Supine     Supine to sit: Mod assist;+2 for physical assistance;+2 for safety/equipment Sit to supine: Max assist;+2 for physical assistance   General bed mobility comments: Pt with decreased initiation and requires mulitmodal cues to accomplish task.    Transfers Overall transfer level: Needs assistance Equipment used: None Transfers: Sit  to/from Stand Sit to Stand: Max assist;+2 physical assistance         General transfer comment: maxA +2 similar to squat pivot to achieve position closer to [facility] x2 times; unable lto achieve full standing today; R knee flexed and pt not able to keep RLE on floor for balance despite knee block    Balance Overall balance assessment: Needs assistance Sitting-balance support: Feet supported Sitting balance-Leahy Scale: Poor Sitting balance - Comments: minA overall with cues Postural control: Posterior lean Standing balance support: Bilateral upper extremity supported Standing balance-Leahy Scale: Poor Standing balance comment: reliant on external support; RLE having difficulty maintaining contact with floor today.                           ADL either performed or assessed with clinical judgement   ADL Overall ADL's : Needs assistance/impaired Eating/Feeding: Moderate assistance;Sitting Eating/Feeding Details (indicate cue type and reason): spoon filled with bite size puree and pt could lif to close to mouth, but requires assist to put spoon in mouth (cortrak slightly in the way) Grooming: Maximal assistance;Sitting   Upper Body Bathing: Maximal assistance;Sitting   Lower Body Bathing: Total assistance;Sit to/from stand;Sitting/lateral leans   Upper Body Dressing : Maximal assistance;Sitting   Lower Body Dressing: Total assistance;Sitting/lateral leans;Sit to/from stand;Cueing for safety;Cueing for sequencing   Toilet Transfer: Maximal assistance;+2 for physical assistance;+2 for safety/equipment;Squat-pivot Toilet Transfer Details (indicate cue type and reason): scooting towards [facility] Toileting- Clothing Manipulation and Hygiene: Total assistance Toileting - Clothing Manipulation Details (indicate cue type and reason): catheter and flexiseal     Functional mobility during ADLs: Maximal assistance;+2  for physical assistance;+2 for safety/equipment (ModA +2 for supine to  sit;  maxA+2 for sit to stand, clearing bed, but not able to acheive fully upright posture. x2 times squat pivot to [facility].) General ADL Comments: Pt limited by decreased cognition/command follow, L inattention, decreased ability to perform ADL and mobility safely.     Vision Baseline Vision/History: No visual deficits Patient Visual Report: Other (comment) (Appears to have L inattention; able to reach midline with both eyes, but requires head turning with maximal cues to look L.) Vision Assessment?: Yes Eye Alignment: Within Functional Limits Ocular Range of Motion: Restricted on the left;Impaired-to be further tested in functional context Alignment/Gaze Preference: Other (comment) (gaze right or midline; assist to turn head to L) Tracking/Visual Pursuits: Left eye does not track laterally;Impaired - to be further tested in functional context Saccades: Undershoots;Impaired - to be further tested in functional context     Perception     Praxis      Pertinent Vitals/Pain Pain Assessment: Faces Faces Pain Scale: No hurt Pain Intervention(s): Monitored during session     Hand Dominance Right   Extremity/Trunk Assessment Upper Extremity Assessment Upper Extremity Assessment: Generalized weakness;LUE deficits/detail;RUE deficits/detail;Difficult to assess due to impaired cognition RUE Deficits / Details: AROM, WFL; volitional movement to feed self; strength 4/5 RUE Coordination: decreased fine motor;decreased gross motor LUE Deficits / Details: AAROM, WFLs; strength 2/5 grossly; appears to have less attentive to L side LUE Coordination: decreased fine motor;decreased gross motor   Lower Extremity Assessment Lower Extremity Assessment: Generalized weakness   Cervical / Trunk Assessment Cervical / Trunk Assessment: Normal   Communication Communication Communication: Expressive difficulties   Cognition Arousal/Alertness: Awake/alert Behavior During Therapy: Flat  affect;Restless Overall Cognitive Status: Impaired/Different from baseline Area of Impairment: Following commands;Awareness;Safety/judgement;Problem solving                       Following Commands: Follows one step commands inconsistently Safety/Judgement: Decreased awareness of safety;Decreased awareness of deficits Awareness: Intellectual Problem Solving: Decreased initiation;Requires verbal cues General Comments: Pt alert, easily startled. Pt following 25-50% of commands today. Pt looking at hand when asked to "show me your thumb," "give me a thumbs up" and wiggling thumb. Pt discovered in bed whole body to L side of bed and head turned to L.   General Comments  O2 >100% on RA; HR <100 BPM and BP reading unable to get accurate reading due to pt moving arm constantly 157/128 at EOB.    Exercises     Shoulder Instructions      Home Living Family/patient expects to be discharged to:: Unsure                                 Additional Comments: No family/caregiver present to determine home set up      Prior Functioning/Environment Level of Independence: Independent                 OT Problem List: Decreased strength;Decreased activity tolerance;Impaired balance (sitting and/or standing);Decreased coordination;Impaired vision/perception;Decreased cognition;Decreased safety awareness;Decreased knowledge of use of DME or AE;Pain;Increased edema;Impaired UE functional use      OT Treatment/Interventions: Self-care/ADL training;Therapeutic exercise;Neuromuscular education;Energy conservation;DME and/or AE instruction;Therapeutic activities;Cognitive remediation/compensation;Patient/family education;Balance training;Visual/perceptual remediation/compensation    OT Goals(Current goals can be found in the care plan section) Acute Rehab OT Goals Patient Stated Goal: unable OT Goal Formulation: Patient unable to participate in goal setting Time For Goal  Achievement: 06/14/21 Potential to Achieve Goals: Good ADL Goals Pt Will Perform Grooming: with min assist;sitting Pt Will Perform Lower Body Dressing: with mod assist;sitting/lateral leans;sit to/from stand Pt Will Perform Toileting - Clothing Manipulation and hygiene: with mod assist;sit to/from stand Pt/caregiver will Perform Home Exercise Program: Increased ROM;Increased strength;Both right and left upper extremity;With minimal assist;With written HEP provided Additional ADL Goal #1: Pt will follow 1-2 step commands with minimal cues to attend to task in 3/5 trials. Additional ADL Goal #2: Pt will utilize strategies for L inattention in order to participate with tasks on L side with minimal cues in 3/5 trials.  OT Frequency: Min 2X/week   Barriers to D/C:            Co-evaluation PT/OT/SLP Co-Evaluation/Treatment: Yes Reason for Co-Treatment: Complexity of the patient's impairments (multi-system involvement);To address functional/ADL transfers;Necessary to address cognition/behavior during functional activity   OT goals addressed during session: ADL's and self-care      AM-PAC OT "6 Clicks" Daily Activity     Outcome Measure Help from another person eating meals?: A Lot Help from another person taking care of personal grooming?: A Lot Help from another person toileting, which includes using toliet, bedpan, or urinal?: Total Help from another person bathing (including washing, rinsing, drying)?: A Lot Help from another person to put on and taking off regular upper body clothing?: A Lot Help from another person to put on and taking off regular lower body clothing?: Total 6 Click Score: 10   End of Session Nurse Communication: Mobility status  Activity Tolerance: Patient tolerated treatment well Patient left: in bed;with call bell/phone within reach;with bed alarm set;with restraints reapplied;Other (comment) (bil mitts)  OT Visit Diagnosis: Unsteadiness on feet (R26.81);Muscle  weakness (generalized) (M62.81);Other symptoms and signs involving cognitive function                Time: 5573-2202 OT Time Calculation (min): 45 min Charges:  OT General Charges $OT Visit: 1 Visit OT Evaluation $OT Eval Moderate Complexity: 1 Mod OT Treatments $Neuromuscular Re-education: 8-22 mins  Flora Lipps, OTR/L Acute Rehabilitation Services Pager: [phone] Office: [phone]   Farris Geiman C 05/31/2021, 10:12 AM

## 2021-05-31 NOTE — Progress Notes (Signed)
  NEUROSURGERY PROGRESS NOTE   Pt seen and examined. No issues overnight.   EXAM: Temp:  [97.8 F (36.6 C)-98.7 F (37.1 C)] 97.8 F (36.6 C) (06/06 0800) Pulse Rate:  [51-92] 64 (06/06 0900) Resp:  [11-26] 17 (06/06 0900) BP: (116-199)/(72-111) 165/105 (06/06 0900) SpO2:  [98 %-100 %] 100 % (06/06 0900) Weight:  [65.9 kg] 65.9 kg (06/06 0500) Intake/Output      06/05 0701 06/06 0700 06/06 0701 06/07 0700   I.V. (mL/kg) 3602.4 (54.7) 311.2 (4.7)   Other     NG/GT 1560 130   IV Piggyback 182.8    Total Intake(mL/kg) 5345.2 (81.1) 441.2 (6.7)   Urine (mL/kg/hr) 3250 (2.1) 600 (2.4)   Drains 199 23   Stool  0   Total Output 3449 623   Net +1896.2 -181.8        Stool Occurrence  1 x    Awake, alert,  verbalizes some responses for therapists, nods to simple questions Intermittently FC more RUE/RLE, moves left side purposefully Mild left neglect EVD in place, open @ 5, bloody CSF  LABS: Lab Results  Component Value Date   CREATININE 0.40 (L) 05/31/2021   BUN 6 05/31/2021   NA 132 (L) 05/31/2021   K 3.8 05/31/2021   CL 97 (L) 05/31/2021   CO2 26 05/31/2021   Lab Results  Component Value Date   WBC 12.9 (H) 05/31/2021   HGB 12.4 (L) 05/31/2021   HCT 35.9 (L) 05/31/2021   MCV 96.2 05/31/2021   PLT 496 (H) 05/31/2021    IMPRESSION: - 60 y.o. male SAH d#13 (could be later given visits to ED for HA prior to admission) with symptomatic spasm but improved - Likely CSW, Na improving  PLAN: - cont hyperdynamic treatment with SBP goal > and IVF hydration for now. Will repeat TCD today. Can likely begin to back off BP augmentation tomorrow if exam remains stable. - Monitor Na - on fluorinef - Cont Nimotop on Q2 dosing - EVD increased to today, cont to monitor exam   Lisbeth Renshaw, MD [facility]

## 2021-05-31 NOTE — Progress Notes (Signed)
CSW received request to call patient's mother. CSW left her a voicemail.   Joaquin Courts, MSW, [facility]

## 2021-05-31 NOTE — Progress Notes (Signed)
Date POD PCO2 HCT BP  MCA ACA PCA OPHT SIPH VERT Basilar  5/27 MS     Right  Left   154  158   -54  -37   21  25   28  21    *  *   -21  -28     -65    5/30 MS     Right  Left   135  142   -38  -148   46  20   17  25   21   47   *  *   *      6/1 JP     Right  Left   128  156   -144  -55   29  30   22  20    *  *   -52  -53   -58       6/3 rs     Right  Left   155  166   *  *   30  54   *  *   *  *   *  *     *     6/6 MS     Right  Left   92  93   -26  *   18  *   20  24   *  *   *  *   *           Right  Left                                            Right  Left                                        MCA = Middle Cerebral Artery OPHT = Opthalmic Artery BASILAR = Basilar Artery  ACA = Anterior Cerebral Artery SIPH = Carotid Siphon PCA = Posterior Cerebral Artery VERT = Verterbral Artery   Normal MCA = 62+\-12 ACA = 50+\-12 PCA = 42+\-23   (*) not insonated due to patient constant movement.  05/31/21- Right Lindegaard ratio=2.56, left Lindegaard ratio= 3.32  05/31/2021 1:06 PM ., MHA, RVT, RDCS, RDMS

## 2021-05-31 NOTE — Progress Notes (Signed)
Assisted tele visit to patient with daughter. ° °Renna Kilmer P, RN  °

## 2021-05-31 NOTE — Progress Notes (Signed)
NAME:  [name] [name], MRN:  [id number], DOB:  [birth year], LOS: 12 ADMISSION DATE:  05/19/2021, CONSULTATION DATE:  05/20/21 REFERRING MD:  Lisbeth Renshaw CHIEF COMPLAINT:  SAH  History of Present Illness:  Pt is a 45 you w/ PMH sinusitis, rotator cuff tear, tobacco, marijuana, and ETOH use, remote hx of crack cocaine use who is being managed for intraventricular hemorrhage w/ large acute SAH now s/p ventriculostomy.  Pt notable seen in ED for frontal headache and had negative heat CT at that time. Pt spoke with family on Monday. Wednesday did not come to work prompting call to EMS; EMS found minimally responsive at home. PT's mother denies knowledge of any recent trauma.   CT head on admission SAH and CTA showed 3 mm aneurysm from anterior communicating artery thought to have ruptured and caused hemorrhage. Ventriculostomy done at bedside in ED.    Significant Hospital Events: Including procedures, antibiotic start and stop dates in addition to other pertinent events   . 5/25 ventriculostomy . 5/26 coil embolization . Ancef 5/25 -> . 5/29 worsening neurological exam consistent with vasospasm, hemodynamic augmentation initiated. . 6/1 worsening hyponatremia: Na of 120.  Patient started on salt tablets 1 g twice daily and fludrocortisone 0.1 mg. . 6/2 BP goal reduced 160 -180.  Salt Tablet increased to 1 g tid. Fludrocortisone increased 0.2 mg.   Interim history/subjective:  No overnight issues Patient remained on vasopressor to augment blood pressure to prevent symptomatic vasospasm Afebrile, serum sodium is improving Objective   Blood pressure (!) 146/81, pulse 64, temperature 97.8 F (36.6 C), temperature source Axillary, resp. rate 16, height 5\' 9"  (1.753 m), weight 65.9 kg, SpO2 100 %.        Intake/Output Summary (Last 24 hours) at 05/31/2021 07/31/2021 Last data filed at 05/31/2021 0800 Gross per 24 hour  Intake 5295.53 ml  Output 3449 ml  Net 1846.53 ml   Filed Weights    05/29/21 0500 05/30/21 0400 05/31/21 0500  Weight: 66.4 kg 65.9 kg 65.9 kg    Examination: General: Middle-aged African-American male, lying in the bed  HENT: EVD with some bloody drainage, moist mucous membranes, no JVD Lungs: Clear to auscultation bilaterally cardiovascular: Regular rate and rhythm; no R/M/G. No JVD Abdomen: Soft, NABS  extremities: Warm and well perfused in all 4 extremities. No edema Neuro: Awake, alert, intermittently following few commands, antigravity in all 4 extremities, slower to respond  Labs/imaging that I have personally reviewed  (right click and "Reselect all SmartList Selections" daily)  Sodium 130->132 K 3.1  Wbc 10.7->12.9  Resolved Hospital Problem list     Assessment & Plan:  Aneurysmal subarachnoid hemorrhage due to ruptured anterior communicating artery aneurysm status post coil embolization on 5/26 Complicated with symptomatic vasospasm Obstructive hydrocephalus s/p EVD Acute encephalopathy due to subarachnoid hemorrhage    Continue to augment blood pressure with goal systolic blood pressure 160-180, Titrate phenylephrine accordingly. EVD will be raised to 15 per neurosurgery today Continue Nimotop but with altered dosing Keppra was stopped after completing 7 days therapy TCD's today Continue neuro watch  Hyponatremia - improving  Hypokalemia  Continue salt tablets 2 g 3 times daily Stop IV fluid Continue fludrocortisone  Diarrhea  Continue loperamide 1 mg/7.5 ml suspension 2 mg   Nutrisource fiber   Best practice (right click and "Reselect all SmartList Selections" daily)  Diet:  per nutrition, cortrak placed 5/27 will maintain but advance diet per speech recs Pain/Anxiety/Delirium protocol (if indicated): No VAP protocol (if indicated): Not  indicated DVT prophylaxis: SCD GI prophylaxis: PPI Glucose control:  SSI No Central venous access:  yes via picc Arterial line:  N/A Foley:  N/A Mobility:  As tolerated PT  consulted:  Yes Last date of multidisciplinary goals of care discussion  per primary Code Status:  full code Disposition: ICU  Labs   CBC: Recent Labs  Lab 05/27/21 0601 05/28/21 0534 05/29/21 0500 05/30/21 0411 05/31/21 0509  WBC 11.4* 10.7* 12.8* 14.5* 12.9*  NEUTROABS 8.7* 8.1* 9.8* 11.4* 9.6*  HGB 11.4* 11.3* 12.2* 11.7* 12.4*  HCT 32.7* 32.2* 34.6* 33.4* 35.9*  MCV 95.1 93.6 94.0 94.1 96.2  PLT 260 302 373 411* 496*    Basic Metabolic Panel: Recent Labs  Lab 05/26/21 2229 05/27/21 0601 05/28/21 0849 05/30/21 0411 05/30/21 1449 05/31/21 0509  NA 128* 129* 130* 131*  --  132*  K 3.7 3.2* 3.7 3.1*  --  3.8  CL 94* 101 100 97*  --  97*  CO2 23 22 24 24   --  26  GLUCOSE 122* 124* 113* 157*  --  147*  BUN 7 7 8 6   --  6  CREATININE 0.50* 0.41* 0.35* 0.45*  --  0.40*  CALCIUM 7.9* 7.3* 8.0* 8.8*  --  9.0  MG  --   --  2.2  --  1.9  --   PHOS  --   --  2.9  --   --   --    GFR: Estimated Creatinine Clearance: 92.7 mL/min (A) (by C-G formula based on SCr of 0.4 mg/dL (L)). Recent Labs  Lab 05/28/21 0534 05/29/21 0500 05/30/21 0411 05/31/21 0509  WBC 10.7* 12.8* 14.5* 12.9*    Liver Function Tests: Recent Labs  Lab 05/30/21 0411 05/31/21 0509  AST 107* 67*  ALT 159* 135*  ALKPHOS 63 69  BILITOT 0.4 0.5  PROT 5.8* 6.2*  ALBUMIN 2.9* 3.0*   No results for input(s): LIPASE, AMYLASE in the last 168 hours. No results for input(s): AMMONIA in the last 168 hours.  ABG    Component Value Date/Time   HCO3 25.1 (H) 01/24/2008 2351   TCO2 26 01/24/2008 2351   ACIDBASEDEF 1.0 01/24/2008 2351     Coagulation Profile: No results for input(s): INR, PROTIME in the last 168 hours.  Cardiac Enzymes: No results for input(s): CKTOTAL, CKMB, CKMBINDEX, TROPONINI in the last 168 hours.  HbA1C: Hemoglobin A1C  Date/Time Value Ref Range Status  12/14/2013 05:30 AM 5.6 4.2 - 6.3 % Final    Comment:    The American Diabetes Association recommends that a primary  goal of therapy should be <7% and that physicians should reevaluate the treatment regimen in patients with HbA1c values consistently >8%.     CBG: Recent Labs  Lab 05/30/21 1546 05/30/21 1932 05/30/21 2333 05/31/21 0326 05/31/21 0800  GLUCAP 117* 121* 112* 140* 142*    Total critical care time: 44 minutes  Performed by: 07/31/21   Critical care time was exclusive of separately billable procedures and treating other patients.   Critical care was necessary to treat or prevent imminent or life-threatening deterioration.   Critical care was time spent personally by me on the following activities: development of treatment plan with patient and/or surrogate as well as nursing, discussions with consultants, evaluation of patient's response to treatment, examination of patient, obtaining history from patient or surrogate, ordering and performing treatments and interventions, ordering and review of laboratory studies, ordering and review of radiographic studies, pulse oximetry and re-evaluation  of patient's condition.   Jacky Kindle MD [facility] See Amion for pager If no response to pager, please call [phone] until 7pm After 7pm, Please call E-link [phone]

## 2021-06-01 LAB — COMPREHENSIVE METABOLIC PANEL
ALT: 108 U/L — ABNORMAL HIGH (ref 0–44)
AST: 47 U/L — ABNORMAL HIGH (ref 15–41)
Albumin: 3 g/dL — ABNORMAL LOW (ref 3.5–5.0)
Alkaline Phosphatase: 64 U/L (ref 38–126)
Anion gap: 10 (ref 5–15)
BUN: 10 mg/dL (ref 6–20)
CO2: 26 mmol/L (ref 22–32)
Calcium: 9.1 mg/dL (ref 8.9–10.3)
Chloride: 96 mmol/L — ABNORMAL LOW (ref 98–111)
Creatinine, Ser: 0.42 mg/dL — ABNORMAL LOW (ref 0.61–1.24)
GFR, Estimated: >60 mL/min (ref >60)
Glucose, Bld: 147 mg/dL — ABNORMAL HIGH (ref 70–99)
Potassium: 3.6 mmol/L (ref 3.5–5.1)
Sodium: 132 mmol/L — ABNORMAL LOW (ref 135–145)
Total Bilirubin: 0.3 mg/dL (ref 0.3–1.2)
Total Protein: 6 g/dL — ABNORMAL LOW (ref 6.5–8.1)

## 2021-06-01 LAB — CBC WITH DIFFERENTIAL/PLATELET
Abs Immature Granulocytes: 0.11 10*3/uL — ABNORMAL HIGH (ref 0.00–0.07)
Basophils Absolute: 0.1 10*3/uL (ref 0.0–0.1)
Basophils Relative: 1 %
Eosinophils Absolute: 0.3 10*3/uL (ref 0.0–0.5)
Eosinophils Relative: 2 %
HCT: 34.9 % — ABNORMAL LOW (ref 39.0–52.0)
Hemoglobin: 12.3 g/dL — ABNORMAL LOW (ref 13.0–17.0)
Immature Granulocytes: 1 %
Lymphocytes Relative: 8 %
Lymphs Abs: 1 10*3/uL (ref 0.7–4.0)
MCH: 33.8 pg (ref 26.0–34.0)
MCHC: 35.2 g/dL (ref 30.0–36.0)
MCV: 95.9 fL (ref 80.0–100.0)
Monocytes Absolute: 1.1 10*3/uL — ABNORMAL HIGH (ref 0.1–1.0)
Monocytes Relative: 10 %
Neutro Abs: 9.3 10*3/uL — ABNORMAL HIGH (ref 1.7–7.7)
Neutrophils Relative %: 78 %
Platelets: 558 10*3/uL — ABNORMAL HIGH (ref 150–400)
RBC: 3.64 MIL/uL — ABNORMAL LOW (ref 4.22–5.81)
RDW: 12.2 % (ref 11.5–15.5)
WBC: 11.9 10*3/uL — ABNORMAL HIGH (ref 4.0–10.5)
nRBC: 0 % (ref 0.0–0.2)

## 2021-06-01 LAB — GLUCOSE, CAPILLARY
Glucose-Capillary: 138 mg/dL — ABNORMAL HIGH (ref 70–99)
Glucose-Capillary: 152 mg/dL — ABNORMAL HIGH (ref 70–99)
Glucose-Capillary: 142 mg/dL — ABNORMAL HIGH (ref 70–99)
Glucose-Capillary: 131 mg/dL — ABNORMAL HIGH (ref 70–99)
Glucose-Capillary: 134 mg/dL — ABNORMAL HIGH (ref 70–99)
Glucose-Capillary: 132 mg/dL — ABNORMAL HIGH (ref 70–99)

## 2021-06-01 MED ORDER — PHENYLEPHRINE HCL-NACL 10-0.9 MG/250ML-% IV SOLN: 0.0000 ug/min | INTRAVENOUS | Status: DC

## 2021-06-01 MED ORDER — PROSOURCE TF PO LIQD
45.0000 mL | Freq: Two times a day (BID) | ORAL | Status: DC
  Administered 2021-06-01 – 2021-07-05 (×68): 45 mL
  Filled 2021-06-01 (×68): qty 45

## 2021-06-01 MED ORDER — LOPERAMIDE HCL 1 MG/7.5ML PO SUSP
2.0000 mg | ORAL | Status: DC
  Administered 2021-06-01 – 2021-06-03 (×11): 2 mg
  Filled 2021-06-01 (×13): qty 15

## 2021-06-01 NOTE — Progress Notes (Addendum)
Physical Therapy Treatment Patient Details Name: [name] [name] MRN: [id number] DOB: 28-Jan-1961 Today's Date: 06/01/2021    History of Present Illness Pt is a 60 y.o. M who was admitted to ICU 5/25 with IVH with large acute SAH now s/p ventriculostomy. 5/26 coil embolization. 5/29 worsening neurological exam consistent with vasospasm. Significant PMH: rotator cuff tear, tobacco, marijuana and ETOH use, remote hx crack cocaine use.    PT Comments    Pt with some regression towards his physical therapy goals this session, with decreased command following and safety awareness. Received with rectal pouch leaking; RN assisted with bed mobility to remove, provide peri care and linen change. Subsequently attempted to sit edge of bed, but pt pushing so heavily posteriorly and even with PT blocking both knees, pt sliding forward and therefore had to assist back into supine position for safety. Will continue to follow acutely.    Follow Up Recommendations  CIR     Equipment Recommendations  Wheelchair (measurements PT);3in1 (PT);Wheelchair cushion (measurements PT)    Recommendations for Other Services       Precautions / Restrictions Precautions Precautions: Fall;Other (comment) Precaution Comments: EVD, BP > 100, wrist restraints, mittens Restrictions Weight Bearing Restrictions: No    Mobility  Bed Mobility Overal bed mobility: Needs Assistance Bed Mobility: Supine to Sit;Sit to Supine;Rolling Rolling: Mod assist;Max assist   Supine to sit: +2 for physical assistance;Max assist Sit to supine: +2 for physical assistance;Total assist   General bed mobility comments: Pt rolling to right and left multiple times for peri care and linen change. Requiring maxA + 2 to sit up on side of bed, decreased initiation. TotalA + 2 to assist back into supine position with trunk and BLE negotiation    Transfers                 General transfer comment: deferred due to safety  today  Ambulation/Gait                 Stairs             Wheelchair Mobility    Modified Rankin (Stroke Patients Only) Modified Rankin (Stroke Patients Only) Pre-Morbid Rankin Score: No symptoms Modified Rankin: Severe disability     Balance Overall balance assessment: Needs assistance Sitting-balance support: Feet supported Sitting balance-Leahy Scale: Zero Sitting balance - Comments: Pt requiring maxA due to heavy posterior lean and sliding forward with hips with both knees blocked. Postural control: Posterior lean                                  Cognition Arousal/Alertness: Awake/alert Behavior During Therapy: Flat affect;Restless Overall Cognitive Status: Impaired/Different from baseline Area of Impairment: Following commands;Awareness;Safety/judgement;Problem solving                       Following Commands: Follows one step commands inconsistently Safety/Judgement: Decreased awareness of safety;Decreased awareness of deficits Awareness: Intellectual Problem Solving: Decreased initiation;Requires verbal cues General Comments: Pt alert, very restless, with decreased command following today. No verbalizations      Exercises      General Comments        Pertinent Vitals/Pain Pain Assessment: Faces Faces Pain Scale: Hurts little more Pain Location: rectal discomfort with removal of pouch Pain Descriptors / Indicators: Grimacing Pain Intervention(s): Monitored during session    Home Living  Prior Function            PT Goals (current goals can now be found in the care plan section) Acute Rehab PT Goals Patient Stated Goal: unable Potential to Achieve Goals: Fair Progress towards PT goals: Not progressing toward goals - comment    Frequency    Min 4X/week      PT Plan Current plan remains appropriate    Co-evaluation              AM-PAC PT "6 Clicks" Mobility   Outcome  Measure  Help needed turning from your back to your side while in a flat bed without using bedrails?: A Lot Help needed moving from lying on your back to sitting on the side of a flat bed without using bedrails?: Total Help needed moving to and from a bed to a chair (including a wheelchair)?: Total Help needed standing up from a chair using your arms (e.g., wheelchair or bedside chair)?: Total Help needed to walk in hospital room?: Total Help needed climbing 3-5 steps with a railing? : Total 6 Click Score: 7    End of Session Equipment Utilized During Treatment: Gait belt Activity Tolerance: Patient tolerated treatment well Patient left: in bed;with bed alarm set;with call bell/phone within reach;with restraints reapplied Nurse Communication: Mobility status PT Visit Diagnosis: Unsteadiness on feet (R26.81);Muscle weakness (generalized) (M62.81);Difficulty in walking, not elsewhere classified (R26.2)     Time: 6834-1962 PT Time Calculation (min) (ACUTE ONLY): 14 min  Charges:  $Therapeutic Activity: 8-22 mins                     Lillia Pauls, PT, DPT Acute Rehabilitation Services Pager [phone] Office [phone]    Norval Morton 06/01/2021, 1:26 PM

## 2021-06-01 NOTE — Progress Notes (Signed)
Nutrition Follow-up  DOCUMENTATION CODES:   Not applicable  INTERVENTION:   Tube feeding via Cortrak tube: Jevity 1.2 at 65 ml/h (1560 ml per day) Add 45 ml ProSource TF BID  Provides 1907 kcal, 109 gm protein, 1259 ml free water daily  Encourage PO intake at meals when awake and alert   NUTRITION DIAGNOSIS:   Inadequate oral intake related to inability to eat as evidenced by NPO status. Ongoing.   GOAL:   Patient will meet greater than or equal to 90% of their needs Met with TF.   MONITOR:   Diet advancement,Labs,Weight trends,TF tolerance,Skin,I & O's  REASON FOR ASSESSMENT:   Other (Comment)    ASSESSMENT:   [name] [name] is a 75 you w/ PMH sinusitis, rotator cuff tear, tobacco, marijuana, and ETOH use, remote hx of crack cocaine use who is being managed for intraventricular hemorrhage w/ large acute SAH now s/p ventriculostomy.  [name] [name] discussed during ICU rounds and with RN.  Per RN [name] [name] fed patient who only ate a few bites at lunch and dinner yesterday. [name] [name] too sleepy for breakfast this am therefore not ready to decrease or transition TF to nocturnal. [name] [name] must be fed. Per RN does not follow commands consistently.   5/27 cortrak placed; tip gastric 6/4 diet advanced; [name] [name] must be fed  Medications reviewed and include: nutrisource fiber BID, imodium (2 mg every 4 hours), nimotop, protonix, NaCl 2 g tabs TID NS @ 125 ml/hr Phenylephrine @ 161 mcg (systolic goal now 096) Labs reviewed: Na 132 CBG's: 138-171  EVD: 72 ml I&O: + 17 L  Current TF:  Jevity 1.2 at 65 ml/h  Provides 1872 kcal, 87 gm protein, 1259 ml free water daily  Diet Order:   Diet Order            DIET - DYS 1 Room service appropriate? No; Fluid consistency: Thin  Diet effective now                 EDUCATION NEEDS:   Education needs have been addressed  Skin:  Skin Assessment: Skin Integrity Issues: Skin Integrity Issues:: Incisions Incisions: closed rt groin  Last BM:  unmeasured x 2  6/7  Height:   Ht Readings from Last 1 Encounters:  05/20/21 $RemoveB'5\' 9"'aFTeOtpf$  (1.753 m)    Weight:   Wt Readings from Last 1 Encounters:  06/01/21 61.3 kg    Ideal Body Weight:  72.7 kg  BMI:  Body mass index is 19.96 kg/m.  Estimated Nutritional Needs:   Kcal:  1700-1900  Protein:  85-100 grams  Fluid:  > 1.7 L  Jobe Mutch P., RD, LDN, CNSC See AMiON for contact information

## 2021-06-01 NOTE — Progress Notes (Signed)
NAME:  [name] [name], MRN:  [id number], DOB:  [birth year], LOS: 13 ADMISSION DATE:  05/19/2021, CONSULTATION DATE:  05/20/21 REFERRING MD:  Lisbeth Renshaw CHIEF COMPLAINT:  SAH  History of Present Illness:  Pt is a 31 you w/ PMH sinusitis, rotator cuff tear, tobacco, marijuana, and ETOH use, remote hx of crack cocaine use who is being managed for intraventricular hemorrhage w/ large acute SAH now s/p ventriculostomy.  Pt notable seen in ED for frontal headache and had negative heat CT at that time. Pt spoke with family on Monday. Wednesday did not come to work prompting call to EMS; EMS found minimally responsive at home. PT's mother denies knowledge of any recent trauma.   CT head on admission SAH and CTA showed 3 mm aneurysm from anterior communicating artery thought to have ruptured and caused hemorrhage. Ventriculostomy done at bedside in ED.    Significant Hospital Events: Including procedures, antibiotic start and stop dates in addition to other pertinent events   . 5/25 ventriculostomy . 5/26 coil embolization . Ancef 5/25 -> . 5/29 worsening neurological exam consistent with vasospasm, hemodynamic augmentation initiated. . 6/1 worsening hyponatremia: Na of 120.  Patient started on salt tablets 1 g twice daily and fludrocortisone 0.1 mg. . 6/2 BP goal reduced 160 -180.  Salt Tablet increased to 1 g tid. Fludrocortisone increased 0.2 mg.   Interim history/subjective:  No overnight issues Patient remained on vasopressor to augment blood pressure to prevent symptomatic vasospasm.  TCD's showed stable velocity Afebrile, serum sodium is improving Objective   Blood pressure (!) 183/92, pulse (!) 59, temperature 98 F (36.7 C), temperature source Axillary, resp. rate (!) 21, height 5\' 9"  (1.753 m), weight 61.3 kg, SpO2 100 %.        Intake/Output Summary (Last 24 hours) at 06/01/2021 1047 Last data filed at 06/01/2021 1000 Gross per 24 hour  Intake 2388.23 ml  Output 2055 ml  Net  333.23 ml   Filed Weights   05/30/21 0400 05/31/21 0500 06/01/21 0500  Weight: 65.9 kg 65.9 kg 61.3 kg    Examination: General: Acutely ill middle-aged African-American male, lying in the bed  HENT: EVD with some bloody drainage, moist mucous membranes, no JVD Lungs: Clear to auscultation bilaterally, no wheezes or rhonchi cardiovascular: Regular rate and rhythm; no R/M/G. No JVD Abdomen: Soft, NABS  extremities: Warm and well perfused in all 4 extremities. No edema Neuro: Awake, alert, intermittently following few commands, antigravity in all 4 extremities, slower to respond and agitated  Labs/imaging that I have personally reviewed  (right click and "Reselect all SmartList Selections" daily)  Sodium 130->132 K 3.6  Wbc 10.7->11.9  Resolved Hospital Problem list     Assessment & Plan:  Aneurysmal subarachnoid hemorrhage due to ruptured anterior communicating artery aneurysm status post coil embolization on 5/26 Complicated with symptomatic vasospasm Obstructive hydrocephalus s/p EVD Acute encephalopathy due to subarachnoid hemorrhage Patient remained agitated and restless in the bed TCD's remain stable Neurosurgery recommend easing of blood pressure goal systolic more than 100 Titrate down phenylephrine EVD was raised to 20 Continue Nimotop Keppra was stopped after completing 7 days therapy TCD's today Continue neuro watch  Hyponatremia - improving  Hypokalemia, resolved Continue salt tablets 2 g 3 times daily Continue fludrocortisone Monitor electrolytes  Diarrhea  Patient continued to have loose bowel movements Continue loperamide 2mg  q4h  Nutrisource fiber   Best practice (right click and "Reselect all SmartList Selections" daily)  Diet:  per nutrition, cortrak placed 5/27 will maintain  but advance diet per speech recs Pain/Anxiety/Delirium protocol (if indicated): No VAP protocol (if indicated): Not indicated DVT prophylaxis: SCD GI prophylaxis:  PPI Glucose control:  SSI No Central venous access:  yes via picc Arterial line:  N/A Foley:  N/A Mobility:  As tolerated PT consulted:  Yes Last date of multidisciplinary goals of care discussion  per primary Code Status:  full code Disposition: ICU  Labs   CBC: Recent Labs  Lab 05/28/21 0534 05/29/21 0500 05/30/21 0411 05/31/21 0509 06/01/21 0542  WBC 10.7* 12.8* 14.5* 12.9* 11.9*  NEUTROABS 8.1* 9.8* 11.4* 9.6* 9.3*  HGB 11.3* 12.2* 11.7* 12.4* 12.3*  HCT 32.2* 34.6* 33.4* 35.9* 34.9*  MCV 93.6 94.0 94.1 96.2 95.9  PLT 302 373 411* 496* 558*    Basic Metabolic Panel: Recent Labs  Lab 05/27/21 0601 05/28/21 0849 05/30/21 0411 05/30/21 1449 05/31/21 0509 06/01/21 0542  NA 129* 130* 131*  --  132* 132*  K 3.2* 3.7 3.1*  --  3.8 3.6  CL 101 100 97*  --  97* 96*  CO2 22 24 24   --  26 26  GLUCOSE 124* 113* 157*  --  147* 147*  BUN 7 8 6   --  6 10  CREATININE 0.41* 0.35* 0.45*  --  0.40* 0.42*  CALCIUM 7.3* 8.0* 8.8*  --  9.0 9.1  MG  --  2.2  --  1.9  --   --   PHOS  --  2.9  --   --   --   --    GFR: Estimated Creatinine Clearance: 86.2 mL/min (A) (by C-G formula based on SCr of 0.42 mg/dL (L)). Recent Labs  Lab 05/29/21 0500 05/30/21 0411 05/31/21 0509 06/01/21 0542  WBC 12.8* 14.5* 12.9* 11.9*    Liver Function Tests: Recent Labs  Lab 05/30/21 0411 05/31/21 0509 06/01/21 0542  AST 107* 67* 47*  ALT 159* 135* 108*  ALKPHOS 63 69 64  BILITOT 0.4 0.5 0.3  PROT 5.8* 6.2* 6.0*  ALBUMIN 2.9* 3.0* 3.0*   No results for input(s): LIPASE, AMYLASE in the last 168 hours. No results for input(s): AMMONIA in the last 168 hours.  ABG    Component Value Date/Time   HCO3 25.1 (H) 01/24/2008 2351   TCO2 26 01/24/2008 2351   ACIDBASEDEF 1.0 01/24/2008 2351     Coagulation Profile: No results for input(s): INR, PROTIME in the last 168 hours.  Cardiac Enzymes: No results for input(s): CKTOTAL, CKMB, CKMBINDEX, TROPONINI in the last 168  hours.  HbA1C: Hemoglobin A1C  Date/Time Value Ref Range Status  12/14/2013 05:30 AM 5.6 4.2 - 6.3 % Final    Comment:    The American Diabetes Association recommends that a primary goal of therapy should be <7% and that physicians should reevaluate the treatment regimen in patients with HbA1c values consistently >8%.     CBG: Recent Labs  Lab 05/31/21 1543 05/31/21 1944 05/31/21 2321 06/01/21 0335 06/01/21 0716  GLUCAP 97 120* 171* 138* 152*    Total critical care time: 39 minutes  Performed by: 08/01/21   Critical care time was exclusive of separately billable procedures and treating other patients.   Critical care was necessary to treat or prevent imminent or life-threatening deterioration.   Critical care was time spent personally by me on the following activities: development of treatment plan with patient and/or surrogate as well as nursing, discussions with consultants, evaluation of patient's response to treatment, examination of patient, obtaining history from patient  or surrogate, ordering and performing treatments and interventions, ordering and review of laboratory studies, ordering and review of radiographic studies, pulse oximetry and re-evaluation of patient's condition.   Cheri Fowler MD [facility] See Amion for pager If no response to pager, please call [phone] until 7pm After 7pm, Please call E-link [phone]

## 2021-06-01 NOTE — TOC Initial Note (Signed)
Transition of Care [facility]) - Initial/Assessment Note    Patient Details  Name: [name] [name] MRN: [id number] Date of Birth: [birth year]  [facility]) CM/SW Contact:    Mearl Latin, LCSW Phone Number: 06/01/2021, 3:37 PM  Clinical Narrative:                 CSW received return call from patient's mother. She asked CSW if patient's insurance would cover the hospital stay. CSW confirmed that patient has insurance listed on his facesheet and that it would depend on his benefits. She requested information on agencies that may help her pay patient's apartment rent as she is concerned to let it go in case he improves quickly. CSW provided resources. CSW also discussed therapy recommendations of CIR versus SNF. She is in agreement with either plan and is hopeful that insurance will pay for it. CSW will continue to follow.   Expected Discharge Plan: Skilled Nursing Facility Barriers to Discharge: Continued Medical Work up   Patient Goals and CMS Choice Patient states their goals for this hospitalization and ongoing recovery are:: Rehab CMS Medicare.gov Compare Post Acute Care list provided to:: Patient Represenative (must comment) Choice offered to / list presented to : Parent  Expected Discharge Plan and Services Expected Discharge Plan: Skilled Nursing Facility In-house Referral: Clinical Social Work   Post Acute Care Choice: IP Rehab,Skilled Nursing Facility Living arrangements for the past 2 months: Apartment                                      Prior Living Arrangements/Services Living arrangements for the past 2 months: Apartment Lives with:: Self Patient language and need for interpreter reviewed:: Yes Do you feel safe going back to the place where you live?: Yes      Need for Family Participation in Patient Care: Yes (Comment) Care giver support system in place?: Yes (comment)   Criminal Activity/Legal Involvement Pertinent to Current  Situation/Hospitalization: No - Comment as needed  Activities of Daily Living      Permission Sought/Granted Permission sought to share information with : Facility Contact Representative,Family Supports    Share Information with NAME: [name]  Permission granted to share info w AGENCY: SNFs  Permission granted to share info w Relationship: Mother  Permission granted to share info w Contact Information: [phone]  Emotional Assessment Appearance:: Appears stated age Attitude/Demeanor/Rapport: Unable to Assess Affect (typically observed): Unable to Assess Orientation: : Oriented to Self Alcohol / Substance Use: Not Applicable Psych Involvement: No (comment)  Admission diagnosis:  SAH (subarachnoid hemorrhage) (HCC) [I60.9] Altered mental status, unspecified altered mental status type [R41.82] Patient Active Problem List   Diagnosis Date Noted  . Cerebral salt-wasting 05/28/2021  . Aneurysmal subarachnoid hemorrhage (HCC) 05/28/2021  . Vasospasm of cerebral artery 05/28/2021  . Diarrheal stools 05/28/2021  . Substance induced mood disorder (HCC) 10/31/2016   PCP:  Oneita Hurt, No Pharmacy:   [id number] - [place], [place] - [address] AT [facility] OF [facility] [address] [name] [address] Phone: [phone] Fax: [phone]  [id number] - [place], [place] - [address] AT [facility] [address] [place] [address] Phone: [phone] Fax: [phone]  [facility] [phone] - , [place] - [address] AT [address]  [name] [address] Phone: [phone] Fax: [phone]  [facility] [address] [name] [address] Phone: [phone] Fax: [phone]  [id number] - [place], [name] - [address] AT [facility] [address] [name]  [address] Phone: [phone] Fax: [phone]     Social Determinants of Health (SDOH) Interventions    Readmission Risk Interventions No flowsheet data found.

## 2021-06-01 NOTE — Progress Notes (Signed)
Dr. Conchita Paris raised the EVD to 20 mmHg. He also changed SBP goal to > 100 in hopes of weaning on pressors. Will continue to monitor for neuro changes given these changes.

## 2021-06-01 NOTE — Progress Notes (Signed)
  NEUROSURGERY PROGRESS NOTE   Pt seen and examined. No issues overnight.   EXAM: Temp:  [98 F (36.7 C)-98.6 F (37 C)] 98 F (36.7 C) (06/07 0745) Pulse Rate:  [55-90] 59 (06/07 1000) Resp:  [12-24] 21 (06/07 1000) BP: (141-210)/(50-150) 183/92 (06/07 1000) SpO2:  [96 %-100 %] 100 % (06/07 1000) Weight:  [61.3 kg] 61.3 kg (06/07 0500) Intake/Output      06/06 0701 06/07 0700 06/07 0701 06/08 0700   I.V. (mL/kg) 999.4 (16.3) 75.1 (1.2)   NG/GT 1560 195   IV Piggyback     Total Intake(mL/kg) 2559.4 (41.8) 270.1 (4.4)   Urine (mL/kg/hr) 2600 (1.8)    Drains 72 6   Stool 0    Total Output 2672 6   Net -112.6 +264.1        Stool Occurrence 2 x     Awake, alert verbalizes some responses for RN, nods to simple questions Intermittently FC, moves both sides relatively symetrically EVD in place, open @ 15, bloody CSF  LABS: Lab Results  Component Value Date   CREATININE 0.42 (L) 06/01/2021   BUN 10 06/01/2021   NA 132 (L) 06/01/2021   K 3.6 06/01/2021   CL 96 (L) 06/01/2021   CO2 26 06/01/2021   Lab Results  Component Value Date   WBC 11.9 (H) 06/01/2021   HGB 12.3 (L) 06/01/2021   HCT 34.9 (L) 06/01/2021   MCV 95.9 06/01/2021   PLT 558 (H) 06/01/2021    IMPRESSION: - 60 y.o. male SAH d#14 (could be later given visits to ED for HA prior to admission) remains neurologically stable to improved. Likely resolving vasospasm, TCD improving. - Likely CSW, Na improving  PLAN: - Will wean off vasopressors today and monitor neurologic exam - Monitor Na - on fluorinef - Cont Nimotop on Q2 dosing - EVD increased to today, cont to monitor exam   Lisbeth Renshaw, MD [facility]

## 2021-06-02 ENCOUNTER — Inpatient Hospital Stay (HOSPITAL_COMMUNITY): Payer: 59

## 2021-06-02 DIAGNOSIS — I609 Nontraumatic subarachnoid hemorrhage, unspecified: Secondary | ICD-10-CM

## 2021-06-02 LAB — CBC WITH DIFFERENTIAL/PLATELET
Abs Immature Granulocytes: 0.06 10*3/uL (ref 0.00–0.07)
Basophils Absolute: 0.1 10*3/uL (ref 0.0–0.1)
Basophils Relative: 1 %
Eosinophils Absolute: 0.2 10*3/uL (ref 0.0–0.5)
Eosinophils Relative: 2 %
HCT: 34.5 % — ABNORMAL LOW (ref 39.0–52.0)
Hemoglobin: 12 g/dL — ABNORMAL LOW (ref 13.0–17.0)
Immature Granulocytes: 1 %
Lymphocytes Relative: 9 %
Lymphs Abs: 0.9 10*3/uL (ref 0.7–4.0)
MCH: 33.1 pg (ref 26.0–34.0)
MCHC: 34.8 g/dL (ref 30.0–36.0)
MCV: 95.3 fL (ref 80.0–100.0)
Monocytes Absolute: 1.2 10*3/uL — ABNORMAL HIGH (ref 0.1–1.0)
Monocytes Relative: 12 %
Neutro Abs: 7.5 10*3/uL (ref 1.7–7.7)
Neutrophils Relative %: 75 %
Platelets: 577 10*3/uL — ABNORMAL HIGH (ref 150–400)
RBC: 3.62 MIL/uL — ABNORMAL LOW (ref 4.22–5.81)
RDW: 12.4 % (ref 11.5–15.5)
WBC: 9.9 10*3/uL (ref 4.0–10.5)
nRBC: 0 % (ref 0.0–0.2)

## 2021-06-02 LAB — GLUCOSE, CAPILLARY
Glucose-Capillary: 120 mg/dL — ABNORMAL HIGH (ref 70–99)
Glucose-Capillary: 128 mg/dL — ABNORMAL HIGH (ref 70–99)
Glucose-Capillary: 134 mg/dL — ABNORMAL HIGH (ref 70–99)
Glucose-Capillary: 136 mg/dL — ABNORMAL HIGH (ref 70–99)
Glucose-Capillary: 139 mg/dL — ABNORMAL HIGH (ref 70–99)
Glucose-Capillary: 140 mg/dL — ABNORMAL HIGH (ref 70–99)

## 2021-06-02 LAB — COMPREHENSIVE METABOLIC PANEL
ALT: 90 U/L — ABNORMAL HIGH (ref 0–44)
AST: 40 U/L (ref 15–41)
Albumin: 3.2 g/dL — ABNORMAL LOW (ref 3.5–5.0)
Alkaline Phosphatase: 67 U/L (ref 38–126)
Anion gap: 9 (ref 5–15)
BUN: 16 mg/dL (ref 6–20)
CO2: 27 mmol/L (ref 22–32)
Calcium: 9.3 mg/dL (ref 8.9–10.3)
Chloride: 98 mmol/L (ref 98–111)
Creatinine, Ser: 0.54 mg/dL — ABNORMAL LOW (ref 0.61–1.24)
GFR, Estimated: >60 mL/min (ref >60)
Glucose, Bld: 127 mg/dL — ABNORMAL HIGH (ref 70–99)
Potassium: 4.1 mmol/L (ref 3.5–5.1)
Sodium: 134 mmol/L — ABNORMAL LOW (ref 135–145)
Total Bilirubin: 0.4 mg/dL (ref 0.3–1.2)
Total Protein: 6.4 g/dL — ABNORMAL LOW (ref 6.5–8.1)

## 2021-06-02 NOTE — Progress Notes (Signed)
  NEUROSURGERY PROGRESS NOTE   Pt seen and examined. No issues overnight.   EXAM: Temp:  [98 F (36.7 C)-99.3 F (37.4 C)] 98 F (36.7 C) (06/08 0800) Pulse Rate:  [62-141] 101 (06/08 1000) Resp:  [13-34] 27 (06/08 1000) BP: (104-163)/(70-110) 126/96 (06/08 1000) SpO2:  [88 %-100 %] 100 % (06/08 1000) Weight:  [61.3 kg] 61.3 kg (06/08 0500) Intake/Output      06/07 0701 06/08 0700 06/08 0701 06/09 0700   I.V. (mL/kg) 248.4 (4.1)    NG/GT 1560 130   Total Intake(mL/kg) 1808.4 (29.5) 130 (2.1)   Urine (mL/kg/hr) 525 (0.4)    Drains 59 10   Stool     Total Output 584 10   Net +1224.4 +120         Awake, alert Not verbalizing today Intermittently FC, moves both sides relatively symetrically EVD in place, open @ 20, bloody CSF - 66cc overnight  LABS: Lab Results  Component Value Date   CREATININE 0.54 (L) 06/02/2021   BUN 16 06/02/2021   NA 134 (L) 06/02/2021   K 4.1 06/02/2021   CL 98 06/02/2021   CO2 27 06/02/2021   Lab Results  Component Value Date   WBC 9.9 06/02/2021   HGB 12.0 (L) 06/02/2021   HCT 34.5 (L) 06/02/2021   MCV 95.3 06/02/2021   PLT 577 (H) 06/02/2021    IMPRESSION: - 60 y.o. male SAH d#15 (could be later given visits to ED for HA prior to admission) remains largely neurologically stable. Resolving vasospasm. - High likelihood of communicating HCP - Likely CSW, Na improving  PLAN: - Monitor Na - on fluorinef - Cont Nimotop on Q2 dosing - Will keep drain at and repeat CTH today prior to clamping.   Lisbeth Renshaw, MD [facility]

## 2021-06-02 NOTE — Progress Notes (Signed)
Physical Therapy Treatment Patient Details Name: [name] [name] MRN: [id number] DOB: 05-16-1961 Today's Date: 06/02/2021    History of Present Illness Pt is a 60 y.o. M who was admitted to ICU 5/25 with IVH with large acute SAH now s/p ventriculostomy. 5/26 coil embolization. 5/29 worsening neurological exam consistent with vasospasm. Significant PMH: rotator cuff tear, tobacco, marijuana and ETOH use, remote hx crack cocaine use.    PT Comments    Pt with no verbalizations this session and poor command following. Will spontaneously reach up and wipe face, requires hand over hand guidance to initiate sipping drink. Pt requiring two person total assist for bed mobility; demonstrates posterior and right lateral lean on edge of bed. Will continue to follow and progress as tolerated.    Follow Up Recommendations  CIR     Equipment Recommendations  Wheelchair (measurements PT);3in1 (PT);Wheelchair cushion (measurements PT)    Recommendations for Other Services       Precautions / Restrictions Precautions Precautions: Fall Precaution Comments: EVD, BP > 100, wrist restraints, mittens bed posey Restrictions Weight Bearing Restrictions: No    Mobility  Bed Mobility Overal bed mobility: Needs Assistance Bed Mobility: Supine to Sit;Sit to Supine Rolling: +2 for physical assistance;Total assist   Supine to sit: +2 for physical assistance;Total assist Sit to supine: +2 for physical assistance;Total assist   General bed mobility comments: does not initiate the task and dependent on staff to change positions. pt keeping bil UE in a elbow flexed adducted position. pt with R lateral lean in bed    Transfers                 General transfer comment: not tested this session due to poor eob sitting balance  Ambulation/Gait                 Stairs             Wheelchair Mobility    Modified Rankin (Stroke Patients Only) Modified Rankin (Stroke Patients  Only) Pre-Morbid Rankin Score: No symptoms Modified Rankin: Severe disability     Balance Overall balance assessment: Needs assistance Sitting-balance support: No upper extremity supported;Feet supported Sitting balance-Leahy Scale: Zero Sitting balance - Comments: pt with R rib collapsed L ribs flared neck lateral flexion toward R, R LE off the ground and holding in an extension position Postural control: Posterior lean                                  Cognition Arousal/Alertness: Awake/alert Behavior During Therapy: Flat affect;Restless Overall Cognitive Status: Impaired/Different from baseline                                 General Comments: alert not following commands pt automatic with wiping of face with hand but not with wash cloth. pt holding fluid in his mouth and even swooshing it at times. no attempts to verbalize.      Exercises Other Exercises Other Exercises: cervical rotation toward L -- reaching midline only    General Comments General comments (skin integrity, edema, etc.): VSS      Pertinent Vitals/Pain Pain Assessment: Faces Faces Pain Scale: Hurts a little bit Pain Location: sensitive with palpation at feet Pain Descriptors / Indicators: Tender Pain Intervention(s): Monitored during session    Home Living  Prior Function            PT Goals (current goals can now be found in the care plan section) Acute Rehab PT Goals Patient Stated Goal: unable Potential to Achieve Goals: Fair    Frequency    Min 4X/week      PT Plan Current plan remains appropriate    Co-evaluation PT/OT/SLP Co-Evaluation/Treatment: Yes Reason for Co-Treatment: Complexity of the patient's impairments (multi-system involvement);To address functional/ADL transfers;Necessary to address cognition/behavior during functional activity;For patient/therapist safety PT goals addressed during session: Mobility/safety  with mobility OT goals addressed during session: ADL's and self-care;Proper use of Adaptive equipment and DME;Strengthening/ROM      AM-PAC PT "6 Clicks" Mobility   Outcome Measure  Help needed turning from your back to your side while in a flat bed without using bedrails?: A Lot Help needed moving from lying on your back to sitting on the side of a flat bed without using bedrails?: Total Help needed moving to and from a bed to a chair (including a wheelchair)?: Total Help needed standing up from a chair using your arms (e.g., wheelchair or bedside chair)?: Total Help needed to walk in hospital room?: Total Help needed climbing 3-5 steps with a railing? : Total 6 Click Score: 7    End of Session   Activity Tolerance: Patient tolerated treatment well Patient left: in bed;with bed alarm set;with call bell/phone within reach;with restraints reapplied Nurse Communication: Mobility status PT Visit Diagnosis: Unsteadiness on feet (R26.81);Muscle weakness (generalized) (M62.81);Difficulty in walking, not elsewhere classified (R26.2)     Time: 1610-9604 PT Time Calculation (min) (ACUTE ONLY): 29 min  Charges:  $Therapeutic Activity: 8-22 mins                     Lillia Pauls, PT, DPT Acute Rehabilitation Services Pager [phone] Office [phone]    Norval Morton 06/02/2021, 11:19 AM

## 2021-06-02 NOTE — Progress Notes (Signed)
Inpatient Rehab Admissions Coordinator:   CIR consult received. Pt.  Is currently tolerating only bed level therapies at total A+2 and I do not think he would tolerate intensity of CIR at this time. I will follow for a few days to monitor progress and participation with therapies, but he if he does not progress, he may need SNF.  Megan Salon, MS, CCC-SLP Rehab Admissions Coordinator  [phone] (celll) [phone] (office)

## 2021-06-02 NOTE — Progress Notes (Signed)
Occupational Therapy Treatment Patient Details Name: [name] [name] MRN: [id number] DOB: 09/18/1961 Today's Date: 06/02/2021    History of present illness Pt is a 60 y.o. M who was admitted to ICU 5/25 with IVH with large acute SAH now s/p ventriculostomy. 5/26 coil embolization. 5/29 worsening neurological exam consistent with vasospasm. Significant PMH: rotator cuff tear, tobacco, marijuana and ETOH use, remote hx crack cocaine use.   OT comments  Pt with no verbalization this session with max cues to attempt to initiate. Pt with poor attempts at grooming task and following 1 step commands. Pt with poor static sitting posture with increased (A) total (A). Recommendation for CIR at this time.    Follow Up Recommendations  CIR    Equipment Recommendations  3 in 1 bedside commode    Recommendations for Other Services Rehab consult    Precautions / Restrictions Precautions Precautions: Fall Precaution Comments: EVD, BP > 100, wrist restraints, mittens bed posey Restrictions Weight Bearing Restrictions: No       Mobility Bed Mobility Overal bed mobility: Needs Assistance Bed Mobility: Supine to Sit;Sit to Supine Rolling: +2 for physical assistance;Total assist   Supine to sit: +2 for physical assistance;Total assist Sit to supine: +2 for physical assistance;Total assist   General bed mobility comments: does not initiate the task and dependent on staff to change positiones. pt keeping bil UE in a elbow flexed adducted position. pt with R lateral lean in bed    Transfers                 General transfer comment: not tested this session due to poor eob sitting balance    Balance Overall balance assessment: Needs assistance Sitting-balance support: No upper extremity supported;Feet supported Sitting balance-Leahy Scale: Zero Sitting balance - Comments: pt with R rib collapsed L ribs flared neck lateral flexion toward R, R LE off the ground and holding in an extension  position Postural control: Posterior lean                                 ADL either performed or assessed with clinical judgement   ADL Overall ADL's : Needs assistance/impaired Eating/Feeding: Maximal assistance Eating/Feeding Details (indicate cue type and reason): SLP present see note. required hand over hand to initiate intake trials Grooming: Wash/dry face;Maximal assistance;Sitting Grooming Details (indicate cue type and reason): hand over hand                               General ADL Comments: pt sitting eob with poor posture so sitting task with cervical rotation attempted     Vision       Perception     Praxis      Cognition Arousal/Alertness: Awake/alert Behavior During Therapy: Flat affect;Restless Overall Cognitive Status: Impaired/Different from baseline                                 General Comments: alert not following commands pt automatic with wiping of face with hand but not with wash cloth. pt holding fluid in his mouth and even swooshing it at times        Exercises Exercises: Other exercises Other Exercises Other Exercises: cervical rotation toward L -- reaching midline only   Shoulder Instructions       General Comments VSS  Pertinent Vitals/ Pain       Pain Assessment: No/denies pain  Home Living                                          Prior Functioning/Environment              Frequency  Min 2X/week        Progress Toward Goals  OT Goals(current goals can now be found in the care plan section)  Progress towards OT goals: Progressing toward goals  Acute Rehab OT Goals Patient Stated Goal: unable OT Goal Formulation: Patient unable to participate in goal setting Time For Goal Achievement: 06/14/21 Potential to Achieve Goals: Good ADL Goals Pt Will Perform Grooming: with min assist;sitting Pt Will Perform Lower Body Dressing: with mod assist;sitting/lateral  leans;sit to/from stand Pt Will Perform Toileting - Clothing Manipulation and hygiene: with mod assist;sit to/from stand Pt/caregiver will Perform Home Exercise Program: Increased ROM;Increased strength;Both right and left upper extremity;With minimal assist;With written HEP provided Additional ADL Goal #1: Pt will follow 1-2 step commands with minimal cues to attend to task in 3/5 trials. Additional ADL Goal #2: Pt will utilize strategies for L inattention in order to participate with tasks on L side with minimal cues in 3/5 trials.  Plan Discharge plan remains appropriate    Co-evaluation    PT/OT/SLP Co-Evaluation/Treatment: Yes Reason for Co-Treatment: Complexity of the patient's impairments (multi-system involvement);Necessary to address cognition/behavior during functional activity;For patient/therapist safety;To address functional/ADL transfers   OT goals addressed during session: ADL's and self-care;Proper use of Adaptive equipment and DME;Strengthening/ROM      AM-PAC OT "6 Clicks" Daily Activity     Outcome Measure   Help from another person eating meals?: A Lot Help from another person taking care of personal grooming?: A Lot Help from another person toileting, which includes using toliet, bedpan, or urinal?: Total Help from another person bathing (including washing, rinsing, drying)?: A Lot Help from another person to put on and taking off regular upper body clothing?: A Lot Help from another person to put on and taking off regular lower body clothing?: Total 6 Click Score: 10    End of Session    OT Visit Diagnosis: Unsteadiness on feet (R26.81);Muscle weakness (generalized) (M62.81);Other symptoms and signs involving cognitive function   Activity Tolerance Patient tolerated treatment well   Patient Left in bed;with call bell/phone within reach;with bed alarm set;with restraints reapplied   Nurse Communication Mobility status;Precautions        Time:  4010-2725 OT Time Calculation (min): 28 min  Charges: OT General Charges $OT Visit: 1 Visit OT Evaluation $OT Eval Moderate Complexity: 1 Mod   Brynn, OTR/L  Acute Rehabilitation Services Pager: [phone] Office: [phone] .    Mateo Flow 06/02/2021, 9:44 AM

## 2021-06-02 NOTE — Progress Notes (Signed)
NAME:  [name] [name], MRN:  [id number], DOB:  January 06, 1961, LOS: 64 ADMISSION DATE:  05/19/2021, CONSULTATION DATE:  05/20/21 REFERRING MD:  Consuella Lose CHIEF COMPLAINT:  SAH  History of Present Illness:  60 year old man with PMHx sinusitis, rotator cuff tear, tobacco/marijuana/EtOH use, remote hx of crack cocaine use who is being managed for intraventricular hemorrhage w/ large acute SAH, now s/p ventriculostomy.  Initially presented to ED for frontal headache and had negative head CT at that time. Per chart review, patient spoke with family on Monday; Wednesday patient did not come to work prompting call to EMS; EMS found minimally responsive at home. Patient's mother denies knowledge of any recent trauma.   CT Head on admission SAH and CTA showed 84mm aneurysm from anterior communicating artery, thought to have ruptured and caused hemorrhage. Ventriculostomy done at bedside in ED.   Significant Hospital Events: Including procedures, antibiotic start and stop dates in addition to other pertinent events   . 5/25 Ventriculostomy, Ancef started . 5/26 Coil Embolization . 5/29 worsening neurological exam consistent with vasospasm, hemodynamic augmentation initiated. . 6/1 worsening hyponatremia: Na of 120.  Patient started on salt tablets 1 g twice daily and fludrocortisone 0.1 mg. . 6/2 BP goal reduced 160 -180.  Salt Tablet increased to 1 g tid. Fludrocortisone increased 0.2 mg. . 6/8 Ongoing agitation, intermittently following commands. CT Head with worsening hydrocephalus despite EVD, open to 10 per NSGY.  Interim History/Subjective:  No significant events overnight Off of pressors for BP support (higher MAP goal for vasospasm) Worked with OT this AM, poor effort  Objective   Blood pressure (!) 126/96, pulse (!) 101, temperature 98 F (36.7 C), temperature source Axillary, resp. rate (!) 27, height $RemoveBe'5\' 9"'KGgeXuZol$  (1.753 m), weight 61.3 kg, SpO2 100 %.        Intake/Output Summary (Last  24 hours) at 06/02/2021 1100 Last data filed at 06/02/2021 1000 Gross per 24 hour  Intake 1580.23 ml  Output 585 ml  Net 995.23 ml   Filed Weights   05/31/21 0500 06/01/21 0500 06/02/21 0500  Weight: 65.9 kg 61.3 kg 61.3 kg   Physical Examination: General: Acutely ill-appearing middle-aged man in NAD. HEENT: [place]/AT, anicteric sclera, PERRL, moist mucous membranes. EVD in place. Neuro: Lethargic. Responds to verbal stimuli. Following commands intermittently. Moves all 4 extremities spontaneously. CV: RRR, no m/g/r. PULM: Breathing even and unlabored on RA. Lung fields CTAB. GI: Soft, nontender, nondistended. Normoactive bowel sounds. Extremities: No LE edema noted. Skin: Warm/dry, no rashes.  Labs/imaging that I have personally reviewed: (right click and "Reselect all SmartList Selections" daily)  WBC 9.9, H&H 12.0/34.5, Plt 577  Na 134, K 4.1, CO2 27, BUN 16, Cr 0.54 Ca 9.3  AST 40, ALT 90, Alk Phos 67, Tbili 0.4  CT Head 6/8 pending  Resolved Hospital Problem list     Assessment & Plan:  Aneurysmal subarachnoid hemorrhage due to ruptured anterior communicating artery aneurysm status post coil embolization on 3/81 Complicated by symptomatic vasospasm Obstructive hydrocephalus s/p EVD Acute encephalopathy due to subarachnoid hemorrhage; patient remains intermittently agitated and restless in the bed vs. Somnolent. - NSGY managing, appreciate assistance - EVD management per NSGY - Goal SBP > 100, off pressors - Continue Nimotop - S/p Keppra x 7-day course - TCDs stable - Repeat CT Head today, 6/8; prelim worsening hydrocephalus, may need more long-term intervention - Neuroprotective measures: HOB > 30 degrees, normoglycemia, normothermia, electrolytes WNL  Hyponatremia - improving  Hypokalemia, resolved - Continue salt tablets 2g TID -  Continue Florinef - Monitor electrolytes, replete as clinically indicated  Diarrhea  - Loperamide $RemoveBefo'2mg'VwRHbEjEWNV$  Q4H - Nutrisource Fiber    Physical deconditioning in the setting of SAH - Appreciate PT/OT/SLP evaluation and management - Current recommendation: CIR once medically ready  Best practice (right click and "Reselect all SmartList Selections" daily)  Diet:  Per nutrition, cortrak placed 5/27 will maintain but advance diet per speech recs Pain/Anxiety/Delirium protocol (if indicated): No VAP protocol (if indicated): Not indicated DVT prophylaxis: SCD GI prophylaxis: PPI Glucose control:  SSI No Central venous access:  yes via picc Arterial line:  N/A Foley:  N/A Mobility:  As tolerated PT consulted:  Yes Last date of multidisciplinary goals of care discussion  per primary Code Status:  full code Disposition: ICU  Critical care time: 38 minutes   Lestine Mount, PA-C [facility] 06/02/21 11:22 AM  Please see [email or web address] for pager details.  From 7A-7P if no response, please call [phone] After hours, please call ELink [phone]

## 2021-06-02 NOTE — Progress Notes (Signed)
Verbal order from Dr. Merrily Pew and Dr. Conchita Paris to change drain to 10 mmhg.

## 2021-06-02 NOTE — Progress Notes (Signed)
Spoke to patient's mom over the phone and gave her updates on patient's status.

## 2021-06-02 NOTE — Progress Notes (Signed)
  Speech Language Pathology Treatment: Dysphagia  Patient Details Name: [name] [name] MRN: [id number] DOB: May 28, 1961 Today's Date: 06/02/2021 Time: 3086-5784 SLP Time Calculation (min) (ACUTE ONLY): 14 min  Assessment / Plan / Recommendation Clinical Impression  Pt demonstrates decreased attention to PO today, with max cueing (hand over hand, giving tastes to initiate sipping, positional changes) pt demonstrates piecemeal swallowing and oral holding with repetitive swishing behavior. RN also reports feeling unsure that pt would be capable of oral meds given his mentation today. Pt appeared physically tense on edge of bed, also shaking at times. WIll possibly attempt further trials later today while pt is at rest to see if total assisted feeding is more successful.   HPI HPI: Pt is a 10 you w/ PMH sinusitis, rotator cuff tear, tobacco, marijuana, and ETOH use, remote hx of crack cocaine use who is being managed for intraventricular hemorrhage w/ large acute SAH now s/p ventriculostomy.  Pt was briefly intubated on 5/26 for procedure.      SLP Plan  Continue with current plan of care       Recommendations  Diet recommendations: Dysphagia 1 (puree);Thin liquid Liquids provided via: Cup;Straw Medication Administration: Via alternative means                Oral Care Recommendations: Oral care QID;Staff/trained caregiver to provide oral care Follow up Recommendations: Skilled Nursing facility Plan: Continue with current plan of care       GO               [name] Ditty, MA CCC-SLP  Acute Rehabilitation Services Pager [phone] Office [phone]  [name] [name] 06/02/2021, 9:38 AM

## 2021-06-02 NOTE — Progress Notes (Signed)
Transcranial Doppler  Date POD PCO2 HCT BP  MCA ACA PCA OPTH SIPH VERT Basilar  5/27 MS     Right  Left   154  158   -54  -37   21  25   28  21    *  *   -21  -28     -65    5/30 MS     Right  Left   135  142   -38  -148   46  20   17  25   21   47   *  *   *      6/1 JP     Right  Left   128  156   -144  -55   29  30   22  20    *  *   -52  -53   -58       6/3 RS     Right  Left   155  166   *  *   30  54   *  *   *  *   *  *     *     6/6 MS     Right  Left   92  93   -26  *   18  *   20  24   *  *   *  *   *      6/8 RH     Right  Left   119  130   -31  -26   38  25   29  27   29   34   -20  -22   *           Right  Left                                        MCA = Middle Cerebral Artery OPHT = Opthalmic Artery BASILAR = Basilar Artery  ACA = Anterior Cerebral Artery SIPH = Carotid Siphon PCA = Posterior Cerebral Artery VERT = Verterbral Artery   Normal MCA = 62+\-12 ACA = 50+\-12 PCA = 42+\-23   (*) = Unable to insonate  06/02/21- Right Lindegaard ratio= 5.17, left Lindegaard ratio= 6.50  , RDMS, RVT

## 2021-06-03 LAB — COMPREHENSIVE METABOLIC PANEL
ALT: 77 U/L — ABNORMAL HIGH (ref 0–44)
AST: 32 U/L (ref 15–41)
Albumin: 3.1 g/dL — ABNORMAL LOW (ref 3.5–5.0)
Alkaline Phosphatase: 62 U/L (ref 38–126)
Anion gap: 7 (ref 5–15)
BUN: 14 mg/dL (ref 6–20)
CO2: 29 mmol/L (ref 22–32)
Calcium: 9.4 mg/dL (ref 8.9–10.3)
Chloride: 100 mmol/L (ref 98–111)
Creatinine, Ser: 0.54 mg/dL — ABNORMAL LOW (ref 0.61–1.24)
GFR, Estimated: >60 mL/min (ref >60)
Glucose, Bld: 120 mg/dL — ABNORMAL HIGH (ref 70–99)
Potassium: 4.2 mmol/L (ref 3.5–5.1)
Sodium: 136 mmol/L (ref 135–145)
Total Bilirubin: 0.6 mg/dL (ref 0.3–1.2)
Total Protein: 6 g/dL — ABNORMAL LOW (ref 6.5–8.1)

## 2021-06-03 LAB — CBC WITH DIFFERENTIAL/PLATELET
Abs Immature Granulocytes: 0.07 10*3/uL (ref 0.00–0.07)
Basophils Absolute: 0.1 10*3/uL (ref 0.0–0.1)
Basophils Relative: 1 %
Eosinophils Absolute: 0.3 10*3/uL (ref 0.0–0.5)
Eosinophils Relative: 3 %
HCT: 33.4 % — ABNORMAL LOW (ref 39.0–52.0)
Hemoglobin: 11.1 g/dL — ABNORMAL LOW (ref 13.0–17.0)
Immature Granulocytes: 1 %
Lymphocytes Relative: 16 %
Lymphs Abs: 1.3 10*3/uL (ref 0.7–4.0)
MCH: 32.6 pg (ref 26.0–34.0)
MCHC: 33.2 g/dL (ref 30.0–36.0)
MCV: 98.2 fL (ref 80.0–100.0)
Monocytes Absolute: 1 10*3/uL (ref 0.1–1.0)
Monocytes Relative: 12 %
Neutro Abs: 5.5 10*3/uL (ref 1.7–7.7)
Neutrophils Relative %: 67 %
Platelets: 588 10*3/uL — ABNORMAL HIGH (ref 150–400)
RBC: 3.4 MIL/uL — ABNORMAL LOW (ref 4.22–5.81)
RDW: 12.7 % (ref 11.5–15.5)
WBC: 8.2 10*3/uL (ref 4.0–10.5)
nRBC: 0 % (ref 0.0–0.2)

## 2021-06-03 LAB — GLUCOSE, CAPILLARY
Glucose-Capillary: 122 mg/dL — ABNORMAL HIGH (ref 70–99)
Glucose-Capillary: 131 mg/dL — ABNORMAL HIGH (ref 70–99)
Glucose-Capillary: 140 mg/dL — ABNORMAL HIGH (ref 70–99)
Glucose-Capillary: 122 mg/dL — ABNORMAL HIGH (ref 70–99)
Glucose-Capillary: 116 mg/dL — ABNORMAL HIGH (ref 70–99)
Glucose-Capillary: 120 mg/dL — ABNORMAL HIGH (ref 70–99)

## 2021-06-03 MED ORDER — LOPERAMIDE HCL 1 MG/7.5ML PO SUSP
2.0000 mg | ORAL | Status: DC | PRN
  Administered 2021-06-03 – 2021-06-09 (×8): 2 mg
  Filled 2021-06-03 (×11): qty 15

## 2021-06-03 NOTE — Progress Notes (Signed)
  Speech Language Pathology Treatment: Dysphagia;Cognitive-Linquistic  Patient Details Name: [name] [name] MRN: [id number] DOB: 08-28-1961 Today's Date: 06/03/2021 Time: 1601-0932 SLP Time Calculation (min) (ACUTE ONLY): 17 min  Assessment / Plan / Recommendation Clinical Impression  Noted attempts at self feeding yesterday with SLP and today this therapist positioned spoon in right hand and guided hand to mouth spasticity present. He accepted bite magic cup and held in mouth then swished. After cueing and appropriate length of time, therapist removed with suction. Delayed swallow with tsp tea.  Pt looks at therapist on command but does not seem to focus visually. He did not follow commands or vocalize despite cues and additional time. Next session ST will plan to co-treat with PT/OT to maximize opportunities participation and response.    HPI HPI: Pt is a 3 you w/ PMH sinusitis, rotator cuff tear, tobacco, marijuana, and ETOH use, remote hx of crack cocaine use who is being managed for intraventricular hemorrhage w/ large acute SAH now s/p ventriculostomy.  Pt was briefly intubated on 5/26 for procedure.      SLP Plan  Continue with current plan of care       Recommendations  Diet recommendations: Dysphagia 1 (puree);Thin liquid Liquids provided via: Cup;Straw Medication Administration: Via alternative means Supervision: Full supervision/cueing for compensatory strategies;Staff to assist with self feeding Compensations: Minimize environmental distractions;Slow rate;Small sips/bites Postural Changes and/or Swallow Maneuvers: Seated upright 90 degrees                Oral Care Recommendations: Oral care QID Follow up Recommendations: Skilled Nursing facility SLP Visit Diagnosis: Dysphagia, unspecified (R13.10);Cognitive communication deficit (T55.732) Plan: Continue with current plan of care       GO                Royce Macadamia 06/03/2021, 2:34 PM Breck Coons  Lonell Face.Ed Nurse, children's [phone] Office [phone]

## 2021-06-03 NOTE — Progress Notes (Signed)
PT Cancellation Note  Patient Details Name: [name] [name] MRN: [id number] DOB: 1961-06-28   Cancelled Treatment:    Reason Eval/Treat Not Completed: Other (comment) Pt remains restless and agitated this afternoon, will re-attempt as safe and as schedule allows.  Deland Pretty, DPT   Acute Rehabilitation Department Pager #: [phone]  Gaetana Michaelis 06/03/2021, 5:37 PM

## 2021-06-03 NOTE — Progress Notes (Signed)
NAME:  [name] [name], MRN:  [id number], DOB:  Sep 16, 1961, LOS: 15 ADMISSION DATE:  05/19/2021, CONSULTATION DATE:  05/20/21 REFERRING MD:  Lisbeth Renshaw CHIEF COMPLAINT:  SAH  Brief Narrative:  60 year old man presented to ED for frontal headache  CT Head on admission with SAH and CTA showed 42mm aneurysm from anterior communicating artery, thought to have ruptured and caused hemorrhage. Ventriculostomy done at bedside in ED.  Significant Hospital Events:  5/25 Admitted with [facility] secondary to ruptured aneurysm, ventriculostomy preformed in ED, Ancef started 5/26 Coil Embolization 5/29 worsening neurological exam consistent with vasospasm, hemodynamic augmentation initiated. 6/1 worsening hyponatremia: Na of 120.  Patient started on salt tablets 1 g twice daily and fludrocortisone 0.1 mg. 6/2 BP goal reduced 160 -180.  Salt Tablet increased to 1 g tid. Fludrocortisone increased 0.2 mg. 6/8 Ongoing agitation, intermittently following commands. CT Head with worsening hydrocephalus despite EVD, open to 10 per NSGY. 6/9 EVD remains in place, repeat head CT as below   Interim History/Subjective:  No acute event overnight Remains encephalopathic in restraints  Objective   Blood pressure 135/84, pulse 92, temperature 98 F (36.7 C), temperature source Axillary, resp. rate (!) 24, height 5\' 9"  (1.753 m), weight 61 kg, SpO2 100 %.        Intake/Output Summary (Last 24 hours) at 06/03/2021 1007 Last data filed at 06/03/2021 0900 Gross per 24 hour  Intake 1560 ml  Output 1130 ml  Net 430 ml    Filed Weights   06/01/21 0500 06/02/21 0500 06/03/21 0500  Weight: 61.3 kg 61.3 kg 61 kg   Physical Examination: General: Chronically ill appearing thin middle aged male lying in bed, in NAD HEENT: [place]/AT,Cortrak in place MM pink/moist, PERRL,  Neuro: Alert, eyes spontaneously open, seen moving extremities but unable to follow commands  CV: s1s2 regular rate and rhythm, no murmur, rubs, or  gallops,  PULM:  Clear to ascultation, no added breath sounds, no increased work of breathing  GI: soft, bowel sounds active in all 4 quadrants, non-tender, non-distended, tolerating TF Extremities: warm/dry, no edema  Skin: no rashes or lesions  Labs/imaging that I have personally reviewed:  Head CT 6/8 > Significantly increased ventriculomegaly,compatible with worsening hydrocephalus. Small (7 mm) focus of hyperdense hemorrhage in the left pericallosal frontal lobe is concerning for a new/interval site of acute hemorrhage given no definite hyperdense hemorrhage at this site on the prior, although a direct comparison is limited given evolving blood products. Recommend attention on close interval  Resolved Hospital Problem list     Assessment & Plan:  Aneurysmal subarachnoid hemorrhage  -due to ruptured anterior communicating artery aneurysm status post coil embolization on 5/26 Symptomatic vasospasm post SAH Obstructive hydrocephalus s/p EVD Acute encephalopathy due to subarachnoid hemorrhage -patient remains intermittently agitated and restless in the bed vs. somnolent. P: Management per neurology/NSGY Maintain neuro protective measures; goal for eurothermia, euglycemia, eunatermia, normoxia, and PCO2 goal of 35-40 Nutrition and bowel regiment  Seizure precautions  AEDs per neurology  Aspirations precautions  EVD per NSGY SBP goal > 100 Continue Nimotop  Hyponatremia - improving  Hypokalemia, resolved P: Continue salt tabs Continue Florinef Trend Bmet   Diarrhea P: Change Loperamide to PRN Continue Fiber supplementation   Physical deconditioning in the setting of SAH P: PT/OT efforts as able   At risk maturation P: Continue TF   Advanced to puree diet, encourage oral intake once adequate for caloric need can stop TF  Best practice   Diet:  Per  nutrition, cortrak placed 5/27 will maintain but advance diet per speech recs Pain/Anxiety/Delirium protocol (if  indicated): No VAP protocol (if indicated): Not indicated DVT prophylaxis: SCD GI prophylaxis: PPI Glucose control:  SSI No Central venous access:  yes via picc Arterial line:  N/A Foley:  N/A Mobility:  As tolerated PT consulted:  Yes Last date of multidisciplinary goals of care discussion  per primary Code Status:  full code Disposition: ICU  Critical care time:    Delfin Gant, NP-C [facility] Personal contact information can be found on Amion  06/03/2021, 10:21 AM

## 2021-06-03 NOTE — Progress Notes (Signed)
  NEUROSURGERY PROGRESS NOTE   Pt seen and examined. No issues overnight. EVD opened yesterday after pt noted to be more somnolent and CT demonstrated increased hydrocephalus  EXAM: Temp:  [97.1 F (36.2 C)-98.4 F (36.9 C)] 98.4 F (36.9 C) (06/09 2000) Pulse Rate:  [79-98] 95 (06/09 2000) Resp:  [16-31] 23 (06/09 2000) BP: (98-142)/(55-118) 129/93 (06/09 2000) SpO2:  [100 %] 100 % (06/09 2000) Weight:  [61 kg] 61 kg (06/09 0500) Intake/Output      06/09 0701 06/10 0700   NG/GT 845   Total Intake(mL/kg) 845 (13.9)   Urine (mL/kg/hr) 900 (0.9)   Drains 112   Stool 0   Total Output 1012   Net -167       Stool Occurrence 1 x    Awake, alert Says single words "yes," "ok" Intermittently FC, moves both sides relatively symetrically EVD in place, open @ 10, xanthochromic CSF   LABS: Lab Results  Component Value Date   CREATININE 0.54 (L) 06/03/2021   BUN 14 06/03/2021   NA 136 06/03/2021   K 4.2 06/03/2021   CL 100 06/03/2021   CO2 29 06/03/2021   Lab Results  Component Value Date   WBC 8.2 06/03/2021   HGB 11.1 (L) 06/03/2021   HCT 33.4 (L) 06/03/2021   MCV 98.2 06/03/2021   PLT 588 (H) 06/03/2021    IMPRESSION: - 60 y.o. male SAH d#16 (could be later given visits to ED for HA prior to admission) remains neurologically stable.  - Shunt dependent HCP, will need VP shunt - Likely CSW, Na improved  PLAN: - Monitor Na - on fluorinef - Cont Nimotop on Q2 dosing to finish 21d - Will keep drain at , plan on VPS likely early next week.   Lisbeth Renshaw, MD [facility]

## 2021-06-04 ENCOUNTER — Other Ambulatory Visit (HOSPITAL_COMMUNITY): Payer: 59

## 2021-06-04 LAB — BASIC METABOLIC PANEL
Anion gap: 8 (ref 5–15)
BUN: 11 mg/dL (ref 6–20)
CO2: 27 mmol/L (ref 22–32)
Calcium: 9.4 mg/dL (ref 8.9–10.3)
Chloride: 100 mmol/L (ref 98–111)
Creatinine, Ser: 0.47 mg/dL — ABNORMAL LOW (ref 0.61–1.24)
GFR, Estimated: >60 mL/min (ref >60)
Glucose, Bld: 126 mg/dL — ABNORMAL HIGH (ref 70–99)
Potassium: 4.1 mmol/L (ref 3.5–5.1)
Sodium: 135 mmol/L (ref 135–145)

## 2021-06-04 LAB — CBC WITH DIFFERENTIAL/PLATELET
Abs Immature Granulocytes: 0.03 10*3/uL (ref 0.00–0.07)
Basophils Absolute: 0.1 10*3/uL (ref 0.0–0.1)
Basophils Relative: 1 %
Eosinophils Absolute: 0.2 10*3/uL (ref 0.0–0.5)
Eosinophils Relative: 4 %
HCT: 34.3 % — ABNORMAL LOW (ref 39.0–52.0)
Hemoglobin: 11.6 g/dL — ABNORMAL LOW (ref 13.0–17.0)
Immature Granulocytes: 0 %
Lymphocytes Relative: 17 %
Lymphs Abs: 1.2 10*3/uL (ref 0.7–4.0)
MCH: 33.4 pg (ref 26.0–34.0)
MCHC: 33.8 g/dL (ref 30.0–36.0)
MCV: 98.8 fL (ref 80.0–100.0)
Monocytes Absolute: 0.7 10*3/uL (ref 0.1–1.0)
Monocytes Relative: 10 %
Neutro Abs: 4.7 10*3/uL (ref 1.7–7.7)
Neutrophils Relative %: 68 %
Platelets: 604 10*3/uL — ABNORMAL HIGH (ref 150–400)
RBC: 3.47 MIL/uL — ABNORMAL LOW (ref 4.22–5.81)
RDW: 12.7 % (ref 11.5–15.5)
WBC: 6.9 10*3/uL (ref 4.0–10.5)
nRBC: 0 % (ref 0.0–0.2)

## 2021-06-04 LAB — GLUCOSE, CAPILLARY
Glucose-Capillary: 123 mg/dL — ABNORMAL HIGH (ref 70–99)
Glucose-Capillary: 124 mg/dL — ABNORMAL HIGH (ref 70–99)
Glucose-Capillary: 128 mg/dL — ABNORMAL HIGH (ref 70–99)
Glucose-Capillary: 134 mg/dL — ABNORMAL HIGH (ref 70–99)
Glucose-Capillary: 137 mg/dL — ABNORMAL HIGH (ref 70–99)
Glucose-Capillary: 143 mg/dL — ABNORMAL HIGH (ref 70–99)

## 2021-06-04 MED ORDER — FUROSEMIDE 10 MG/ML IJ SOLN
40.0000 mg | Freq: Four times a day (QID) | INTRAMUSCULAR | Status: AC
  Administered 2021-06-04 (×2): 40 mg via INTRAVENOUS
  Filled 2021-06-04 (×2): qty 4

## 2021-06-04 NOTE — Progress Notes (Signed)
eLink Physician-Brief Progress Note Patient Name: [name] [name] DOB: 03/24/1961 MRN: [id number]   Date of Service  06/04/2021  HPI/Events of Note  Patient needs his soft restraints order renewed for safety.  eICU Interventions  Restraints renewed.        [name] [name] 06/04/2021, 11:30 PM

## 2021-06-04 NOTE — Progress Notes (Signed)
  NEUROSURGERY PROGRESS NOTE   Pt seen and examined. No issues overnight.   EXAM: Temp:  [98 F (36.7 C)-99.1 F (37.3 C)] 99.1 F (37.3 C) (06/10 0800) Pulse Rate:  [80-99] 99 (06/10 1100) Resp:  [14-27] 24 (06/10 1100) BP: (113-151)/(61-126) 140/124 (06/10 1100) SpO2:  [98 %-100 %] 99 % (06/10 1100) Weight:  [62.6 kg] 62.6 kg (06/10 0400) Intake/Output      06/09 0701 06/10 0700 06/10 0701 06/11 0700   NG/GT 1560    Total Intake(mL/kg) 1560 (24.9)    Urine (mL/kg/hr) 1350 (0.9)    Drains 153 10   Stool 0    Total Output 1503 10   Net +57 -10        Urine Occurrence 2 x    Stool Occurrence 2 x     Awake, alert Not speaking today occasionally FC, moves both sides purposefully EVD in place, open @ 10, xanthochromic CSF   LABS: Lab Results  Component Value Date   CREATININE 0.47 (L) 06/04/2021   BUN 11 06/04/2021   NA 135 06/04/2021   K 4.1 06/04/2021   CL 100 06/04/2021   CO2 27 06/04/2021   Lab Results  Component Value Date   WBC 6.9 06/04/2021   HGB 11.6 (L) 06/04/2021   HCT 34.3 (L) 06/04/2021   MCV 98.8 06/04/2021   PLT 604 (H) 06/04/2021    IMPRESSION: - 60 y.o. male SAH d#17 (could be later given visits to ED for HA prior to admission) remains neurologically stable.  - Shunt dependent HCP, will need VP shunt - Likely CSW, Na improved  PLAN: - Monitor Na - on fluorinef - Cont Nimotop on Q2 dosing to finish 21d - Will keep drain at , plan on VPS likely early next week.   Lisbeth Renshaw, MD [facility]

## 2021-06-04 NOTE — Progress Notes (Signed)
Physical Therapy Treatment Patient Details Name: [name] [name] MRN: [id number] DOB: Feb 21, 1961 Today's Date: 06/04/2021    History of Present Illness Pt is a 60 y.o. M who was admitted to ICU 5/25 with IVH with large acute SAH now s/p ventriculostomy. 5/26 coil embolization. 5/29 worsening neurological exam consistent with vasospasm. Significant PMH: rotator cuff tear, tobacco, marijuana and ETOH use, remote hx crack cocaine use.    PT Comments    Pt followed a few cues this date, but pt was inconsistent and unsure whether it was purposeful. Pt did assist minimally with rolling in bed, needing maxAx2 to roll either direction. However, he continues to require Tax2 for supine <> sit transitions and continues to not verbalize. In addition, pt continues to display R inattention, decreased safety awareness, and decreased spatial orientation as he has a strong posterior bias sitting EOB. As a result, he needs maxA to block him from sliding himself off the EOB. He did initiate an attempt to scoot himself posteriorly though, demonstrating progress. Will continue to follow acutely. Current recommendations remain appropriate.    Follow Up Recommendations  CIR     Equipment Recommendations  Wheelchair (measurements PT);3in1 (PT);Wheelchair cushion (measurements PT)    Recommendations for Other Services       Precautions / Restrictions Precautions Precautions: Fall Precaution Comments: EVD, BP > 100, wrist restraints, mittens, bed posey Restrictions Weight Bearing Restrictions: No    Mobility  Bed Mobility Overal bed mobility: Needs Assistance Bed Mobility: Supine to Sit;Sit to Supine;Rolling Rolling: +2 for physical assistance;Max assist   Supine to sit: +2 for physical assistance;Total assist;HOB elevated Sit to supine: +2 for physical assistance;Total assist   General bed mobility comments: Pt not initiating with supine <> sit transitions, needing TAx2 to complete. Pt did flex L knee  when cued 1x, but needed physical assistance to place the foot on the bed for rolling, maxAx2 to roll either direction several times with cues for pt to pull with UEs on therapist's hands. pt keeping bil UE in a elbow flexed adducted position.    Transfers                 General transfer comment: not tested this session due to poor eob sitting balance  Ambulation/Gait                 Stairs             Wheelchair Mobility    Modified Rankin (Stroke Patients Only) Modified Rankin (Stroke Patients Only) Pre-Morbid Rankin Score: No symptoms Modified Rankin: Severe disability     Balance Overall balance assessment: Needs assistance Sitting-balance support: No upper extremity supported;Feet supported;Bilateral upper extremity supported Sitting balance-Leahy Scale: Zero Sitting balance - Comments: pt with R rib collapsed L ribs flared neck lateral flexion toward R, R LE off the ground and holding in an extension position. Max cues provided to place hands in lap to avoid pushing into extension on bed and to flex his hips, but pt resistive needing maxA to block his knees and maintain his balance to avoid sliding off EOB. Postural control: Posterior lean     Standing balance comment: not tested this session due to poor eob sitting balance                            Cognition Arousal/Alertness: Awake/alert Behavior During Therapy: Flat affect;Restless;Impulsive Overall Cognitive Status: Impaired/Different from baseline  General Comments: Pt awake and alert, following up to ~3 simple commands during the session (possibly incidental and not purposeful). Pt not attempting to verbalize this session. He is impulsive and restless grabbing  at his penis, nose, or rubbing his head throughout and needing blocking to maintain his safety. R inattention.      Exercises      General Comments        Pertinent  Vitals/Pain Pain Assessment: Faces Faces Pain Scale: Hurts little more Pain Location: generalized with mobility, appears to be uncomfortable around penis with noted skin breakdown (RN aware) Pain Descriptors / Indicators: Tender;Discomfort;Guarding Pain Intervention(s): Limited activity within patient's tolerance;Monitored during session;Repositioned    Home Living                      Prior Function            PT Goals (current goals can now be found in the care plan section) Acute Rehab PT Goals Patient Stated Goal: unable PT Goal Formulation: Patient unable to participate in goal setting Time For Goal Achievement: 06/12/21 Potential to Achieve Goals: Fair Progress towards PT goals: Progressing toward goals (slowly)    Frequency    Min 4X/week      PT Plan Current plan remains appropriate    Co-evaluation              AM-PAC PT "6 Clicks" Mobility   Outcome Measure  Help needed turning from your back to your side while in a flat bed without using bedrails?: A Lot Help needed moving from lying on your back to sitting on the side of a flat bed without using bedrails?: Total Help needed moving to and from a bed to a chair (including a wheelchair)?: Total Help needed standing up from a chair using your arms (e.g., wheelchair or bedside chair)?: Total Help needed to walk in hospital room?: Total Help needed climbing 3-5 steps with a railing? : Total 6 Click Score: 7    End of Session   Activity Tolerance: Patient tolerated treatment well Patient left: in bed;with bed alarm set;with call bell/phone within reach;with restraints reapplied Nurse Communication: Mobility status PT Visit Diagnosis: Unsteadiness on feet (R26.81);Muscle weakness (generalized) (M62.81);Difficulty in walking, not elsewhere classified (R26.2)     Time: 9562-1308 PT Time Calculation (min) (ACUTE ONLY): 28 min  Charges:  $Therapeutic Activity: 8-22 mins $Neuromuscular  Re-education: 8-22 mins                     Raymond Gurney, PT, DPT Acute Rehabilitation Services  Pager: [phone] Office: [phone]    Jewel Baize 06/04/2021, 3:24 PM

## 2021-06-04 NOTE — Progress Notes (Signed)
Inpatient Rehab Admissions Coordinator:    Pt. Not yet able to participate with therapies and not appropriate for CIR at this time. [facility] team will continue to follow and monitor for progress and participation with therapies.   Megan Salon, MS, CCC-SLP Rehab Admissions Coordinator  [phone] (celll) [phone] (office)

## 2021-06-04 NOTE — Progress Notes (Signed)
NAME:  [name] [name], MRN:  [id number], DOB:  1961/02/27, LOS: 16 ADMISSION DATE:  05/19/2021, CONSULTATION DATE:  05/20/21 REFERRING MD:  Lisbeth Renshaw CHIEF COMPLAINT:  SAH  Brief Narrative:  60 year old man presented to ED for frontal headache  CT Head on admission with SAH and CTA showed 11mm aneurysm from anterior communicating artery, thought to have ruptured and caused hemorrhage. Ventriculostomy done at bedside in ED.  Significant Hospital Events:  5/25 Admitted with [facility] secondary to ruptured aneurysm, ventriculostomy preformed in ED, Ancef started 5/26 Coil Embolization 5/29 worsening neurological exam consistent with vasospasm, hemodynamic augmentation initiated. 6/1 worsening hyponatremia: Na of 120.  Patient started on salt tablets 1 g twice daily and fludrocortisone 0.1 mg. 6/2 BP goal reduced 160 -180.  Salt Tablet increased to 1 g tid. Fludrocortisone increased 0.2 mg. 6/8 Ongoing agitation, intermittently following commands. CT Head with worsening hydrocephalus despite EVD, open to 10 per NSGY. 6/9 EVD remains in place, repeat head CT as below   Interim History/Subjective:  No acute events overnight  Significantly volume positive   Objective   Blood pressure (!) 126/115, pulse 93, temperature 98.2 F (36.8 C), temperature source Oral, resp. rate 19, height 5\' 9"  (1.753 m), weight 62.6 kg, SpO2 100 %.        Intake/Output Summary (Last 24 hours) at 06/04/2021 0710 Last data filed at 06/04/2021 0600 Gross per 24 hour  Intake 1495 ml  Output 1503 ml  Net -8 ml    Filed Weights   06/02/21 0500 06/03/21 0500 06/04/21 0400  Weight: 61.3 kg 61 kg 62.6 kg   Physical Examination: General: Acute on chronic ill appearing middle aged male lying in bed  on, in NAD HEENT: [place]/AT MM pink/moist, PERRL,  Neuro: Wakes to verbal stimuli, unable to follow commands, seen spontaneously moving all extremities  CV: s1s2 regular rate and rhythm, no murmur, rubs, or gallops,   PULM:  Clear to ascultation bilaterally, no added breath sounds, no increased work of breathing GI: soft, bowel sounds active in all 4 quadrants, non-tender, non-distended, tolerating TF Extremities: warm/dry, no edema  Skin: no rashes or lesions  Labs/imaging that I have personally reviewed:  Head CT 6/8 > Significantly increased ventriculomegaly,compatible with worsening hydrocephalus. Small (7 mm) focus of hyperdense hemorrhage in the left pericallosal frontal lobe is concerning for a new/interval site of acute hemorrhage given no definite hyperdense hemorrhage at this site on the prior, although a direct comparison is limited given evolving blood products. Recommend attention on close interval  Resolved Hospital Problem list     Assessment & Plan:  Aneurysmal subarachnoid hemorrhage  -due to ruptured anterior communicating artery aneurysm status post coil embolization on 5/26 Symptomatic vasospasm post SAH Obstructive hydrocephalus s/p EVD Acute encephalopathy due to subarachnoid hemorrhage -patient remains intermittently agitated and restless in the bed vs. somnolent. P: Management per neurology  Maintain neuro protective measures; goal for eurothermia, euglycemia, eunatermia, normoxia, and PCO2 goal of 35-40 Nutrition and bowel regiment  Seizure precautions  AEDs per neurology  Aspirations precautions  EVD per NSGY SBP goal > 120 Continue Nimotop  Hyponatremia - improving  Hypokalemia, resolved P: Continue salt tabs Continue Florinef  Monitor Bmet    Diarrhea - improved P: Loperamide PRN Continue Fiber supplementation   Physical deconditioning in the setting of SAH P: PT/OT efforts as able   At risk maturation P: Continue TF Encourage PO intake as able   Best practice   Diet:  Per nutrition, cortrak placed 5/27 will  maintain but advance diet per speech recs Pain/Anxiety/Delirium protocol (if indicated): No VAP protocol (if indicated): Not indicated DVT  prophylaxis: SCD GI prophylaxis: PPI Glucose control:  SSI No Central venous access:  yes via picc Arterial line:  N/A Foley:  N/A Mobility:  As tolerated PT consulted:  Yes Last date of multidisciplinary goals of care discussion  per primary Code Status:  full code Disposition: ICU  Critical care time: NA   Delfin Gant, NP-C [facility] Personal contact information can be found on Amion  06/04/2021, 7:15 AM

## 2021-06-05 LAB — CBC WITH DIFFERENTIAL/PLATELET
Abs Immature Granulocytes: 0.03 10*3/uL (ref 0.00–0.07)
Basophils Absolute: 0.1 10*3/uL (ref 0.0–0.1)
Basophils Relative: 1 %
Eosinophils Absolute: 0.2 10*3/uL (ref 0.0–0.5)
Eosinophils Relative: 2 %
HCT: 38.6 % — ABNORMAL LOW (ref 39.0–52.0)
Hemoglobin: 12.8 g/dL — ABNORMAL LOW (ref 13.0–17.0)
Immature Granulocytes: 0 %
Lymphocytes Relative: 17 %
Lymphs Abs: 1.2 10*3/uL (ref 0.7–4.0)
MCH: 32.4 pg (ref 26.0–34.0)
MCHC: 33.2 g/dL (ref 30.0–36.0)
MCV: 97.7 fL (ref 80.0–100.0)
Monocytes Absolute: 0.8 10*3/uL (ref 0.1–1.0)
Monocytes Relative: 11 %
Neutro Abs: 5.2 10*3/uL (ref 1.7–7.7)
Neutrophils Relative %: 69 %
Platelets: 648 10*3/uL — ABNORMAL HIGH (ref 150–400)
RBC: 3.95 MIL/uL — ABNORMAL LOW (ref 4.22–5.81)
RDW: 12.6 % (ref 11.5–15.5)
WBC: 7.5 10*3/uL (ref 4.0–10.5)
nRBC: 0 % (ref 0.0–0.2)

## 2021-06-05 LAB — BASIC METABOLIC PANEL
Anion gap: 7 (ref 5–15)
BUN: 16 mg/dL (ref 6–20)
CO2: 30 mmol/L (ref 22–32)
Calcium: 9.5 mg/dL (ref 8.9–10.3)
Chloride: 100 mmol/L (ref 98–111)
Creatinine, Ser: 0.56 mg/dL — ABNORMAL LOW (ref 0.61–1.24)
GFR, Estimated: >60 mL/min (ref >60)
Glucose, Bld: 146 mg/dL — ABNORMAL HIGH (ref 70–99)
Potassium: 4.2 mmol/L (ref 3.5–5.1)
Sodium: 137 mmol/L (ref 135–145)

## 2021-06-05 LAB — GLUCOSE, CAPILLARY
Glucose-Capillary: 136 mg/dL — ABNORMAL HIGH (ref 70–99)
Glucose-Capillary: 144 mg/dL — ABNORMAL HIGH (ref 70–99)
Glucose-Capillary: 148 mg/dL — ABNORMAL HIGH (ref 70–99)
Glucose-Capillary: 124 mg/dL — ABNORMAL HIGH (ref 70–99)
Glucose-Capillary: 130 mg/dL — ABNORMAL HIGH (ref 70–99)
Glucose-Capillary: 119 mg/dL — ABNORMAL HIGH (ref 70–99)

## 2021-06-05 MED ORDER — NIMODIPINE 30 MG PO CAPS
30.0000 mg | ORAL_CAPSULE | ORAL | Status: DC
Start: 2021-06-05 — End: 2021-06-08

## 2021-06-05 MED ORDER — NIMODIPINE 6 MG/ML PO SOLN
30.0000 mg | ORAL | Status: DC
  Administered 2021-06-05 – 2021-06-08 (×36): 30 mg
  Filled 2021-06-05 (×19): qty 10

## 2021-06-05 NOTE — Progress Notes (Signed)
NAME:  [name] [name], MRN:  [id number], DOB:  09-Apr-1961, LOS: 17 ADMISSION DATE:  05/19/2021, CONSULTATION DATE:  05/20/21 REFERRING MD:  Lisbeth Renshaw CHIEF COMPLAINT:  SAH  Brief Narrative:  60 year old man presented to ED for frontal headache  CT Head on admission with SAH and CTA showed 59mm aneurysm from anterior communicating artery, thought to have ruptured and caused hemorrhage. Ventriculostomy done at bedside in ED.  Significant Hospital Events:  5/25 Admitted with [facility] secondary to ruptured aneurysm, ventriculostomy preformed in ED, Ancef started 5/26 Coil Embolization 5/29 worsening neurological exam consistent with vasospasm, hemodynamic augmentation initiated. 6/1 worsening hyponatremia: Na of 120.  Patient started on salt tablets 1 g twice daily and fludrocortisone 0.1 mg. 6/2 BP goal reduced 160 -180.  Salt Tablet increased to 1 g tid. Fludrocortisone increased 0.2 mg. 6/8 Ongoing agitation, intermittently following commands. CT Head with worsening hydrocephalus despite EVD, open to 10 per NSGY. 6/9 EVD remains in place, repeat head CT as below   Interim History/Subjective:  No overnight events, resting comfortably  Objective   Blood pressure 133/77, pulse 92, temperature 97.8 F (36.6 C), temperature source Axillary, resp. rate 20, height 5\' 9"  (1.753 m), weight 59.8 kg, SpO2 100 %.        Intake/Output Summary (Last 24 hours) at 06/05/2021 1010 Last data filed at 06/05/2021 0800 Gross per 24 hour  Intake 1625 ml  Output 3151 ml  Net -1526 ml   Filed Weights   06/03/21 0500 06/04/21 0400 06/05/21 0500  Weight: 61 kg 62.6 kg 59.8 kg   Physical Examination: General: acutely and chronically ill appearing, no distress HEENT:mmm Neuro: responds to verbal stimuli, left sided hemiparesis, moves the right side, does not follow commands CV: tachycardic, regular, no mrg  PULM:  ctab, no wheezes or crackles, no increased wob, no respiratory distress GI: soft,  nontender Extremities: warm/dry, no edema   Labs/imaging that I have personally reviewed:  Head CT 6/8 > Significantly increased ventriculomegaly,compatible with worsening hydrocephalus. Small (7 mm) focus of hyperdense hemorrhage in the left pericallosal frontal lobe is concerning for a new/interval site of acute hemorrhage given no definite hyperdense hemorrhage at this site on the prior, although a direct comparison is limited given evolving blood products. Recommend attention on close interval  Resolved Hospital Problem list     Assessment & Plan:  Aneurysmal subarachnoid hemorrhage  -due to ruptured anterior communicating artery aneurysm status post coil embolization on 5/26 Symptomatic vasospasm post SAH Obstructive hydrocephalus s/p EVD Acute encephalopathy due to subarachnoid hemorrhage -patient remains intermittently agitated and restless in the bed vs. somnolent. P: Management per neurology  Maintain neuro protective measures; goal for eurothermia, euglycemia, eunatermia, normoxia, and PCO2 goal of 35-40 Nutrition and bowel regiment  Seizure precautions  AEDs per neurology  Aspirations precautions  EVD per NSGY - plans for V/P shunt next week.  SBP goal > 120 Continue Nimotop  Hyponatremia HypokalemiaP: Continue salt tabs Continue Florinef  Monitor Bmet    Diarrhea - improved P: Loperamide PRN Continue Fiber supplementation   Physical deconditioning in the setting of SAH P: PT/OT efforts as able   At risk maturation P: Continue TF Encourage PO intake as able   Best practice   Diet:  tube feeds, advance diet per speech eval.  Pain/Anxiety/Delirium protocol (if indicated): No VAP protocol (if indicated): Not indicated DVT prophylaxis: SCD GI prophylaxis: PPI Glucose control:  SSI No Central venous access:  yes via picc Arterial line:  N/A Foley:  N/A  Mobility:  As tolerated PT consulted:  Yes Last date of multidisciplinary goals of care discussion   per primary Code Status:  full code Disposition: ICU  Critical care time: NA   Durel Salts, MD Pulmonary and Critical Care Medicine [facility]

## 2021-06-05 NOTE — Progress Notes (Signed)
Overall stable.  Patient will awaken.  Minimally verbal.  Follows commands bilaterally.  Strength appears equal.  Ventriculostomy working.  He is afebrile.  Vital signs are stable.  Draining about 5 cc/h.  Status post subarachnoid hemorrhage with hydrocephalus requiring CSF diversion.  Planning on VP shunting per Dr. Conchita Paris next week.

## 2021-06-05 NOTE — Progress Notes (Signed)
Discussed with MD Julio Sicks and Pharmacist Candise Bowens about previous note stating nimotop course should be 21d. Noted that nimotop order was not present. Verbal order obtained by MD Julio Sicks to reorder nimotop through to 6/14, when 21 days is complete.

## 2021-06-05 NOTE — Plan of Care (Signed)
  Problem: Activity: Goal: Risk for activity intolerance will decrease Outcome: Progressing   Problem: Nutrition: Goal: Adequate nutrition will be maintained Outcome: Progressing   Problem: Elimination: Goal: Will not experience complications related to urinary retention Outcome: Progressing   Problem: Safety: Goal: Non-violent Restraint(s) Outcome: Progressing

## 2021-06-06 DIAGNOSIS — G9341 Metabolic encephalopathy: Secondary | ICD-10-CM

## 2021-06-06 DIAGNOSIS — R5381 Other malaise: Secondary | ICD-10-CM

## 2021-06-06 LAB — CBC WITH DIFFERENTIAL/PLATELET
Abs Immature Granulocytes: 0.04 10*3/uL (ref 0.00–0.07)
Basophils Absolute: 0.1 10*3/uL (ref 0.0–0.1)
Basophils Relative: 1 %
Eosinophils Absolute: 0.2 10*3/uL (ref 0.0–0.5)
Eosinophils Relative: 3 %
HCT: 38.2 % — ABNORMAL LOW (ref 39.0–52.0)
Hemoglobin: 12.7 g/dL — ABNORMAL LOW (ref 13.0–17.0)
Immature Granulocytes: 1 %
Lymphocytes Relative: 16 %
Lymphs Abs: 1.3 10*3/uL (ref 0.7–4.0)
MCH: 33.4 pg (ref 26.0–34.0)
MCHC: 33.2 g/dL (ref 30.0–36.0)
MCV: 100.5 fL — ABNORMAL HIGH (ref 80.0–100.0)
Monocytes Absolute: 0.7 10*3/uL (ref 0.1–1.0)
Monocytes Relative: 9 %
Neutro Abs: 5.7 10*3/uL (ref 1.7–7.7)
Neutrophils Relative %: 70 %
Platelets: 625 10*3/uL — ABNORMAL HIGH (ref 150–400)
RBC: 3.8 MIL/uL — ABNORMAL LOW (ref 4.22–5.81)
RDW: 12.8 % (ref 11.5–15.5)
WBC: 8 10*3/uL (ref 4.0–10.5)
nRBC: 0 % (ref 0.0–0.2)

## 2021-06-06 LAB — GLUCOSE, CAPILLARY
Glucose-Capillary: 128 mg/dL — ABNORMAL HIGH (ref 70–99)
Glucose-Capillary: 140 mg/dL — ABNORMAL HIGH (ref 70–99)
Glucose-Capillary: 128 mg/dL — ABNORMAL HIGH (ref 70–99)
Glucose-Capillary: 118 mg/dL — ABNORMAL HIGH (ref 70–99)
Glucose-Capillary: 120 mg/dL — ABNORMAL HIGH (ref 70–99)
Glucose-Capillary: 109 mg/dL — ABNORMAL HIGH (ref 70–99)

## 2021-06-06 NOTE — Progress Notes (Signed)
Overall stable.  Continues to be restless in bed.  Ventriculostomy functioning well.  Afebrile.  Vitals are stable.  CSF appears clear.  Overall stable.  Continue external ventricular drain.  Plan shunt this week per Dr. Conchita Paris.

## 2021-06-06 NOTE — Progress Notes (Signed)
NAME:  [name] [name], MRN:  [id number], DOB:  [birth year], LOS: 18 ADMISSION DATE:  05/19/2021, CONSULTATION DATE:  05/20/21 REFERRING MD:  Lisbeth Renshaw CHIEF COMPLAINT:  SAH  Brief Narrative:  60 year old man presented to ED for frontal headache  CT Head on admission with SAH and CTA showed 48mm aneurysm from anterior communicating artery, thought to have ruptured and caused hemorrhage. Ventriculostomy done at bedside in ED.  Significant Hospital Events:  5/25 Admitted with [facility] secondary to ruptured aneurysm, ventriculostomy preformed in ED, Ancef started 5/26 Coil Embolization 5/29 worsening neurological exam consistent with vasospasm, hemodynamic augmentation initiated. 6/1 worsening hyponatremia: Na of 120.  Patient started on salt tablets 1 g twice daily and fludrocortisone 0.1 mg. 6/2 BP goal reduced 160 -180.  Salt Tablet increased to 1 g tid. Fludrocortisone increased 0.2 mg. 6/8 Ongoing agitation, intermittently following commands. CT Head with worsening hydrocephalus despite EVD, open to 10 per NSGY. 6/9 EVD remains in place, repeat head CT as below   Interim History/Subjective:  No acute overnight events  Objective   Blood pressure (!) 131/108, pulse 95, temperature 98.3 F (36.8 C), temperature source Oral, resp. rate (!) 23, height 5\' 9"  (1.753 m), weight 57.3 kg, SpO2 100 %.        Intake/Output Summary (Last 24 hours) at 06/06/2021 1030 Last data filed at 06/06/2021 0800 Gross per 24 hour  Intake 1243.67 ml  Output 1353 ml  Net -109.33 ml   Filed Weights   06/05/21 0500 06/05/21 1730 06/06/21 0500  Weight: 59.8 kg 57.4 kg 57.3 kg   Physical Examination: General: acutely and chronically ill appearing, no distress HEENT:mmm Neuro: intermittently following commands, left sided hemiparesis CV: tachycardic, regular, no mrg  PULM:  ctab, no wheezes or crackles, no increased wob, no respiratory distress GI: soft, nontender Extremities: warm/dry, no edema    Labs/imaging that I have personally reviewed:  Head CT 6/8 > Significantly increased ventriculomegaly,compatible with worsening hydrocephalus. Small (7 mm) focus of hyperdense hemorrhage in the left pericallosal frontal lobe is concerning for a new/interval site of acute hemorrhage given no definite hyperdense hemorrhage at this site on the prior, although a direct comparison is limited given evolving blood products. Recommend attention on close interval  Resolved Hospital Problem list     Assessment & Plan:  Aneurysmal subarachnoid hemorrhage  -due to ruptured anterior communicating artery aneurysm status post coil embolization on 5/26 Symptomatic vasospasm post SAH Obstructive hydrocephalus s/p EVD Acute encephalopathy due to subarachnoid hemorrhage -patient remains intermittently agitated and restless in the bed vs. somnolent. P: Management per neurology  Maintain neuro protective measures; goal for eurothermia, euglycemia, eunatermia, normoxia, and PCO2 goal of 35-40 Nutrition and bowel regiment  Seizure precautions  AEDs per neurology  Aspirations precautions  EVD per NSGY - plans for V/P shunt next week.  SBP goal > 120 Continue Nimotop  Hyponatremia HypokalemiaP: Continue salt tabs Continue Florinef  Monitor Bmet    Diarrhea - improved P: Loperamide PRN Continue Fiber supplementation   Physical deconditioning in the setting of SAH P: PT/OT efforts as able   At risk maturation P: Continue TF Encourage PO intake as able   Best practice   Diet:  tube feeds, advance diet per speech eval.  Pain/Anxiety/Delirium protocol (if indicated): No VAP protocol (if indicated): Not indicated DVT prophylaxis: SCD GI prophylaxis: PPI Glucose control:  SSI No Central venous access:  yes via picc Arterial line:  N/A Foley:  N/A Mobility:  As tolerated PT consulted:  Yes  Last date of multidisciplinary goals of care discussion  per primary Code Status:  full  code Disposition: ICU  Critical care time: NA   Durel Salts, MD Pulmonary and Critical Care Medicine [facility]

## 2021-06-07 ENCOUNTER — Other Ambulatory Visit: Payer: Self-pay | Admitting: Neurosurgery

## 2021-06-07 DIAGNOSIS — R4182 Altered mental status, unspecified: Secondary | ICD-10-CM | POA: Diagnosis not present

## 2021-06-07 LAB — CBC WITH DIFFERENTIAL/PLATELET
Abs Immature Granulocytes: 0.03 10*3/uL (ref 0.00–0.07)
Basophils Absolute: 0.1 10*3/uL (ref 0.0–0.1)
Basophils Relative: 1 %
Eosinophils Absolute: 0.2 10*3/uL (ref 0.0–0.5)
Eosinophils Relative: 3 %
HCT: 38.1 % — ABNORMAL LOW (ref 39.0–52.0)
Hemoglobin: 12.4 g/dL — ABNORMAL LOW (ref 13.0–17.0)
Immature Granulocytes: 0 %
Lymphocytes Relative: 17 %
Lymphs Abs: 1.4 10*3/uL (ref 0.7–4.0)
MCH: 33.1 pg (ref 26.0–34.0)
MCHC: 32.5 g/dL (ref 30.0–36.0)
MCV: 101.6 fL — ABNORMAL HIGH (ref 80.0–100.0)
Monocytes Absolute: 0.7 10*3/uL (ref 0.1–1.0)
Monocytes Relative: 8 %
Neutro Abs: 6.1 10*3/uL (ref 1.7–7.7)
Neutrophils Relative %: 71 %
Platelets: 634 10*3/uL — ABNORMAL HIGH (ref 150–400)
RBC: 3.75 MIL/uL — ABNORMAL LOW (ref 4.22–5.81)
RDW: 12.7 % (ref 11.5–15.5)
WBC: 8.5 10*3/uL (ref 4.0–10.5)
nRBC: 0 % (ref 0.0–0.2)

## 2021-06-07 LAB — GLUCOSE, CAPILLARY
Glucose-Capillary: 103 mg/dL — ABNORMAL HIGH (ref 70–99)
Glucose-Capillary: 105 mg/dL — ABNORMAL HIGH (ref 70–99)
Glucose-Capillary: 115 mg/dL — ABNORMAL HIGH (ref 70–99)
Glucose-Capillary: 120 mg/dL — ABNORMAL HIGH (ref 70–99)
Glucose-Capillary: 121 mg/dL — ABNORMAL HIGH (ref 70–99)
Glucose-Capillary: 138 mg/dL — ABNORMAL HIGH (ref 70–99)

## 2021-06-07 NOTE — Progress Notes (Signed)
  NEUROSURGERY PROGRESS NOTE   Pt seen and examined. No issues overnight.   EXAM: Temp:  [97.6 F (36.4 C)-99 F (37.2 C)] 97.6 F (36.4 C) (06/13 1200) Pulse Rate:  [83-100] 83 (06/13 1300) Resp:  [14-26] 18 (06/13 1300) BP: (106-159)/(58-125) 125/96 (06/13 1300) SpO2:  [96 %-100 %] 98 % (06/13 1300) Weight:  [60.7 kg] 60.7 kg (06/13 0500) Intake/Output      06/12 0701 06/13 0700 06/13 0701 06/14 0700   P.O.  0   NG/GT 1430 260   Total Intake(mL/kg) 1430 (23.6) 260 (4.3)   Urine (mL/kg/hr) 600 (0.4) 200 (0.5)   Drains 59 18   Stool 0 0   Total Output 659 218   Net +771 +42        Urine Occurrence 1 x    Stool Occurrence 2 x 1 x    Awake, alert Answers some questions with single word answers. occasionally FC, moves both sides purposefully EVD in place, open @ 10, xanthochromic CSF   LABS: Lab Results  Component Value Date   CREATININE 0.56 (L) 06/05/2021   BUN 16 06/05/2021   NA 137 06/05/2021   K 4.2 06/05/2021   CL 100 06/05/2021   CO2 30 06/05/2021   Lab Results  Component Value Date   WBC 8.5 06/07/2021   HGB 12.4 (L) 06/07/2021   HCT 38.1 (L) 06/07/2021   MCV 101.6 (H) 06/07/2021   PLT 634 (H) 06/07/2021    IMPRESSION: - 60 y.o. male SAH d#20 (could be later given visits to ED for HA prior to admission) remains neurologically stable.  - Shunt dependent HCP, will need VP shunt.  - Likely CSW, Na improved  PLAN: - Monitor Na - on fluorinef - Cont Nimotop on Q2 dosing to finish 21d - d/c tomorrow - Plan on lap assisted VP shunt tomorrow. Clamp drain tonight.   Lisbeth Renshaw, MD [facility]

## 2021-06-07 NOTE — Progress Notes (Signed)
Physical Therapy Treatment Patient Details Name: [name] [name] MRN: [id number] DOB: January 16, 1961 Today's Date: 06/07/2021    History of Present Illness Pt is a 60 y.o. M who was admitted to ICU 5/25 with IVH with large acute SAH now s/p ventriculostomy. 5/26 coil embolization. 5/29 worsening neurological exam consistent with vasospasm. Significant PMH: rotator cuff tear, tobacco, marijuana and ETOH use, remote hx crack cocaine use.    PT Comments    Pt not making significant progress towards his physical therapy goals this session. No attempts at verbalizations and following ~2 simple commands. Requiring two person max-total assist for bed mobility. Did not initiate with attempts to stand. Worked on facilitation of eating/drinking at edge of bed. Pt swishing tea in his mouth, ultimately requiring suctioning to remove. Able to swallow small bites of pureed food and demonstrates less pushing behaviors when distracted. Will reassess post VP shunt placement.     Follow Up Recommendations  CIR     Equipment Recommendations  Wheelchair (measurements PT);3in1 (PT);Wheelchair cushion (measurements PT)    Recommendations for Other Services       Precautions / Restrictions Precautions Precautions: Fall Precaution Comments: EVD, BP > 100, wrist restraints, mittens, bed posey Restrictions Weight Bearing Restrictions: No    Mobility  Bed Mobility Overal bed mobility: Needs Assistance Bed Mobility: Supine to Sit;Sit to Supine;Rolling Rolling: Max assist   Supine to sit: +2 for physical assistance;Max assist Sit to supine: +2 for physical assistance;Total assist   General bed mobility comments: Pt with slight initiation of supine > sit, requiring maxA + 2. No initiation noted with return to supine    Transfers                 General transfer comment: Attempted x 2 with no initiation noted by pt  Ambulation/Gait                 Stairs             Wheelchair  Mobility    Modified Rankin (Stroke Patients Only) Modified Rankin (Stroke Patients Only) Pre-Morbid Rankin Score: No symptoms Modified Rankin: Severe disability     Balance Overall balance assessment: Needs assistance Sitting-balance support: No upper extremity supported;Feet supported;Bilateral upper extremity supported Sitting balance-Leahy Scale: Poor Sitting balance - Comments: Pt with posterior and right lateral lean. When distracted demonstrates less pushing behaviors Postural control: Posterior lean;Right lateral lean                                  Cognition Arousal/Alertness: Awake/alert Behavior During Therapy: Flat affect;Restless;Impulsive Overall Cognitive Status: Impaired/Different from baseline                                 General Comments: Pt awake and alert, following up to ~2 simple commands during the session. Pt not attempting to verbalize this session. He is impulsive and restless      Exercises      General Comments        Pertinent Vitals/Pain Pain Assessment: Faces Faces Pain Scale: Hurts little more Pain Location: generalized with mobility Pain Descriptors / Indicators: Tender;Discomfort;Guarding Pain Intervention(s): Monitored during session    Home Living                      Prior Function  PT Goals (current goals can now be found in the care plan section) Acute Rehab PT Goals Patient Stated Goal: unable PT Goal Formulation: Patient unable to participate in goal setting Time For Goal Achievement: 06/12/21 Potential to Achieve Goals: Fair    Frequency    Min 4X/week      PT Plan Current plan remains appropriate    Co-evaluation              AM-PAC PT "6 Clicks" Mobility   Outcome Measure  Help needed turning from your back to your side while in a flat bed without using bedrails?: A Lot Help needed moving from lying on your back to sitting on the side of a flat bed  without using bedrails?: Total Help needed moving to and from a bed to a chair (including a wheelchair)?: Total Help needed standing up from a chair using your arms (e.g., wheelchair or bedside chair)?: Total Help needed to walk in hospital room?: Total Help needed climbing 3-5 steps with a railing? : Total 6 Click Score: 7    End of Session   Activity Tolerance: Patient tolerated treatment well Patient left: in bed;with bed alarm set;with call bell/phone within reach;with restraints reapplied Nurse Communication: Mobility status PT Visit Diagnosis: Unsteadiness on feet (R26.81);Muscle weakness (generalized) (M62.81);Difficulty in walking, not elsewhere classified (R26.2)     Time: 1333-1410 PT Time Calculation (min) (ACUTE ONLY): 37 min  Charges:  $Therapeutic Activity: 23-37 mins                     Lillia Pauls, PT, DPT Acute Rehabilitation Services Pager [phone] Office [phone]    Norval Morton 06/07/2021, 2:50 PM

## 2021-06-07 NOTE — Progress Notes (Signed)
Inpatient Rehab Admissions Coordinator:   I do not have a bed for this Pt. On CIR today. Note, pt. Last worked with PT on 6/10 and was noted to tolerate only bed level therapy at total+2 assist. Will follow for progress and participation with therapy but Pt. Currently appears unable to tolerate intensity of CIR.   Megan Salon, MS, CCC-SLP Rehab Admissions Coordinator  [phone] (celll) [phone] (office)

## 2021-06-07 NOTE — Progress Notes (Signed)
NAME:  [name] [name], MRN:  [id number], DOB:  [birth year], LOS: 19 ADMISSION DATE:  05/19/2021, CONSULTATION DATE:  05/20/21 REFERRING MD:  Lisbeth Renshaw CHIEF COMPLAINT:  SAH  Brief Narrative:  Goes by [name] [name]"  60 year old man presented to ED for frontal headache  CT Head on admission with SAH and CTA showed 54mm aneurysm from anterior communicating artery, thought to have ruptured and caused hemorrhage. Ventriculostomy done at bedside in ED.  S/p coiling on 5/26.   Significant Hospital Events:  5/25 Admitted with [facility] secondary to ruptured aneurysm, ventriculostomy preformed in ED, Ancef started 5/26 Coil Embolization 5/29 worsening neurological exam consistent with vasospasm, hemodynamic augmentation initiated. 6/1 worsening hyponatremia: Na of 120.  Patient started on salt tablets 1 g twice daily and fludrocortisone 0.1 mg. 6/2 BP goal reduced 160 -180.  Salt Tablet increased to 1 g tid. Fludrocortisone increased 0.2 mg. 6/8 Ongoing agitation, intermittently following commands. CT Head with worsening hydrocephalus despite EVD, open to 10 per NSGY. 6/9 EVD remains in place, repeat head CT as below  6/10-12, no significant neuro changes, intermittently agitated.  Ongoing EVD, plans for VP shunt next week  Interim History/Subjective:   No neuro changes RN reports patient will occasionally become tachycardic, almost up to 200 but then void, HR normalizes VP shunt planned for 6/14 by NSGY Afebrile Unmeasured urinary occurences  Objective   Blood pressure (!) 118/93, pulse 91, temperature 97.7 F (36.5 C), temperature source Axillary, resp. rate 14, height 5\' 9"  (1.753 m), weight 60.7 kg, SpO2 100 %.        Intake/Output Summary (Last 24 hours) at 06/07/2021 0945 Last data filed at 06/07/2021 06/09/2021 Gross per 24 hour  Intake 1430 ml  Output 858 ml  Net 572 ml   Filed Weights   06/06/21 0500 06/06/21 1030 06/07/21 0500  Weight: 57.3 kg 56.4 kg 60.7 kg   Physical  Examination: General:  Elderly thin male lying in bed in NAD HEENT: MM pink/moist, pupils 3/reactive, R frontal EVD at 14, clear Neuro: non verbal, does not follow commands, occasionally tracks, MAE, resting at the moment, posey belt remains in place CV: NSR, no murmur PULM:  non labored, CTA, normal saturations on room air GI: soft, NT, +BS, urinary pouch in place  Extremities: warm/dry, no edema  Skin: no rashes    Labs/imaging that I have personally reviewed:  6/13- CBC stable, no BMET   Head CT 6/8 > Significantly increased ventriculomegaly,compatible with worsening hydrocephalus. Small (7 mm) focus of hyperdense hemorrhage in the left pericallosal frontal lobe is concerning for a new/interval site of acute hemorrhage given no definite hyperdense hemorrhage at this site on the prior, although a direct comparison is limited given evolving blood products. Recommend attention on close interval  Resolved Hospital Problem list     Assessment & Plan:  Aneurysmal subarachnoid hemorrhage  -due to ruptured anterior communicating artery aneurysm status post coil embolization on 5/26 Symptomatic vasospasm post SAH Obstructive hydrocephalus s/p EVD Acute encephalopathy due to subarachnoid hemorrhage -patient remains intermittently agitated and restless in the bed vs. somnolent. P: - Per NSGY, plans for VP shunt 6/14 - ongoing EVD - SBP goals > 120 - continue nimodipine  - Maintain neuro protective measures; goal for eurothermia, euglycemia, eunatermia, normoxia, and PCO2 goal of 35-40 - Nutrition and bowel regiment  - Seizure precautions  - Aspirations precautions  - neuro checks q4 - hopefully can transfer out to PCU tomorrow after shunt placement  Hyponatremia Hypokalemia P: - BMET in  am - strict I/Os - continue salt tabs and florinef  Diarrhea - resolved P: - prn loperamide - continue nutrisource fiber  Physical deconditioning in the setting of SAH P: - PT/OT efforts  as able  - likely CIR candidate   At risk maturation P: - Continue TF - ongoing SLP, currently dysphagia 1 thin liquids only   Best practice   Diet:  tube feeds, advance diet per speech eval.  Pain/Anxiety/Delirium protocol (if indicated): No VAP protocol (if indicated): Not indicated DVT prophylaxis: SCD, heparin SQ GI prophylaxis: PPI Glucose control:  SSI No Central venous access:  yes via picc Arterial line:  N/A Foley:  N/A Mobility:  As tolerated, PT/ OT PT consulted:  Yes Last date of multidisciplinary goals of care discussion  per primary, no family at bedside 6/13 Code Status:  full code Disposition: ICU  Critical care time: NA     Posey Boyer, ACNP  Pulmonary & Critical Care 06/07/2021, 11:18 AM

## 2021-06-07 NOTE — Consult Note (Signed)
[facility] Surgery Consult Note  [name] [name] [birth year]  [id number].    Requesting MD: Conchita Paris, MD Chief Complaint/Reason for Consult: VP shunt placement   HPI:  [name] is a 60 y/o M admitted 05/19/21 with [facility] secondary to a ruptured aneurysm of the anterior communicating artery. He underwent ventriculostomy in the ED followed by IR coil embolization. Diagnostic cerebral angiogram 5/26 showed successful coil embolization of right A1-A2 junction aneurysm as well as vasospasm involving the bilateral supraclinoid ICA, A1, and M1. Following this procedure he continued to have a worsening neurologic exam with hyponatremia. Repeat CT head 6/8 showed worsening hydrocephalus despite EVD. CCS has been asked to see the patient for intra-operative assistance of ventriculoperitoneal shunt placement.   ROS: Review of Systems  Unable to perform ROS: Acuity of condition   History reviewed. No pertinent family history.  Past Medical History:  Diagnosis Date   Alcohol abuse    Drug abuse (HCC)    Right rotator cuff tear    Seasonal allergies     Past Surgical History:  Procedure Laterality Date   AMPUTATION Left 05/30/2019   Procedure: REVISION AMPUTATION OF L INDEX FINGER;  Surgeon: Betha Loa, MD;  Location: [facility] OR;  Service: Orthopedics;  Laterality: Left;   ANEURYSM COILING  05/19/2021   Anterior communicating aneurysm   IR ANGIO INTRA EXTRACRAN SEL INTERNAL CAROTID UNI R MOD SED  05/20/2021   IR ANGIOGRAM FOLLOW UP STUDY  05/20/2021   IR TRANSCATH/EMBOLIZ  05/20/2021   RADIOLOGY WITH ANESTHESIA N/A 05/20/2021   Procedure: IR WITH ANESTHESIA;  Surgeon: Lisbeth Renshaw, MD;  Location: [facility] OR;  Service: Radiology;  Laterality: N/A;   SHOULDER ARTHROSCOPY WITH OPEN ROTATOR CUFF REPAIR AND DISTAL CLAVICLE ACROMINECTOMY Right 07/03/2020   Procedure: SHOULDER ARTHROSCOPY WITH OPEN ROTATOR CUFF REPAIR AND DISTAL CLAVICLE ARTHROSCOPIC DEBRIDEMENT;  Surgeon: Frederico Hamman, MD;   Location: [facility];  Service: Orthopedics;  Laterality: Right;   SHOULDER CLOSED REDUCTION Right 10/28/2020   Procedure: CLOSED MANIPULATION SHOULDER;  Surgeon: Frederico Hamman, MD;  Location: [facility];  Service: Orthopedics;  Laterality: Right;   toe amputaion Left yrs ago    Social History:  reports that he has been smoking cigarettes. He has a 7.50 pack-year smoking history. He has never used smokeless tobacco. He reports current alcohol use of about 3.0 standard drinks of alcohol per week. He reports current drug use. Drugs: Marijuana and "Crack" cocaine.  Allergies: No Known Allergies  Medications Prior to Admission  Medication Sig Dispense Refill   etodolac (LODINE) 500 MG tablet Take 500 mg by mouth 2 (two) times daily.     [employer] ALLERGY RELIEF-D 5-120 MG tablet Take 1 tablet by mouth 2 (two) times daily.     doxycycline (VIBRAMYCIN) 100 MG capsule Take 1 capsule (100 mg total) by mouth 2 (two) times daily. One po bid x 7 days 14 capsule 0   predniSONE (DELTASONE) 20 MG tablet Take 20 mg by mouth daily.     triamcinolone cream (KENALOG) 0.5 % Apply 1 application topically in the morning and at bedtime.      Blood pressure 116/82, pulse 85, temperature 97.7 F (36.5 C), temperature source Axillary, resp. rate 15, height 5\' 9"  (1.753 m), weight 60.7 kg, SpO2 100 %. Physical Exam: Constitutional: NAD; chronically ill appearing male in restraints Eyes: Moist conjunctiva; no lid lag; anicteric; PERRL Neck: Trachea midline; no thyromegaly Lungs: Normal respiratory effort; no tactile fremitus CV: RRR; no palpable thrills; no  pitting edema GI: Abd with posey belt in place; no palpable hepatosplenomegaly; no surgical scars on abdomen.  MSK: symmetrical; no clubbing/cyanosis neurologic: restless in bed, opens eyes to voice, FC intermittently for me Lymphatic: No palpable cervical or axillary lymphadenopathy  Results for orders placed or performed during  the hospital encounter of 05/19/21 (from the past 48 hour(s))  Glucose, capillary     Status: Abnormal   Collection Time: 06/05/21 11:37 AM  Result Value Ref Range   Glucose-Capillary 148 (H) 70 - 99 mg/dL    Comment: Glucose reference range applies only to samples taken after fasting for at least 8 hours.  Glucose, capillary     Status: Abnormal   Collection Time: 06/05/21  3:17 PM  Result Value Ref Range   Glucose-Capillary 124 (H) 70 - 99 mg/dL    Comment: Glucose reference range applies only to samples taken after fasting for at least 8 hours.  Glucose, capillary     Status: Abnormal   Collection Time: 06/05/21  7:29 PM  Result Value Ref Range   Glucose-Capillary 130 (H) 70 - 99 mg/dL    Comment: Glucose reference range applies only to samples taken after fasting for at least 8 hours.  Glucose, capillary     Status: Abnormal   Collection Time: 06/05/21 11:19 PM  Result Value Ref Range   Glucose-Capillary 119 (H) 70 - 99 mg/dL    Comment: Glucose reference range applies only to samples taken after fasting for at least 8 hours.  Glucose, capillary     Status: Abnormal   Collection Time: 06/06/21  3:28 AM  Result Value Ref Range   Glucose-Capillary 128 (H) 70 - 99 mg/dL    Comment: Glucose reference range applies only to samples taken after fasting for at least 8 hours.  CBC with Differential/Platelet     Status: Abnormal   Collection Time: 06/06/21  5:59 AM  Result Value Ref Range   WBC 8.0 4.0 - 10.5 K/uL   RBC 3.80 (L) 4.22 - 5.81 MIL/uL   Hemoglobin 12.7 (L) 13.0 - 17.0 g/dL   HCT 98.3 (L) 38.2 - 50.5 %   MCV 100.5 (H) 80.0 - 100.0 fL   MCH 33.4 26.0 - 34.0 pg   MCHC 33.2 30.0 - 36.0 g/dL   RDW 39.7 67.3 - 41.9 %   Platelets 625 (H) 150 - 400 K/uL   nRBC 0.0 0.0 - 0.2 %   Neutrophils Relative % 70 %   Neutro Abs 5.7 1.7 - 7.7 K/uL   Lymphocytes Relative 16 %   Lymphs Abs 1.3 0.7 - 4.0 K/uL   Monocytes Relative 9 %   Monocytes Absolute 0.7 0.1 - 1.0 K/uL   Eosinophils  Relative 3 %   Eosinophils Absolute 0.2 0.0 - 0.5 K/uL   Basophils Relative 1 %   Basophils Absolute 0.1 0.0 - 0.1 K/uL   Immature Granulocytes 1 %   Abs Immature Granulocytes 0.04 0.00 - 0.07 K/uL    Comment: Performed at [facility], [address] [place], [name] [address]  Glucose, capillary     Status: Abnormal   Collection Time: 06/06/21  8:21 AM  Result Value Ref Range   Glucose-Capillary 140 (H) 70 - 99 mg/dL    Comment: Glucose reference range applies only to samples taken after fasting for at least 8 hours.  Glucose, capillary     Status: Abnormal   Collection Time: 06/06/21 12:51 PM  Result Value Ref Range   Glucose-Capillary  128 (H) 70 - 99 mg/dL    Comment: Glucose reference range applies only to samples taken after fasting for at least 8 hours.  Glucose, capillary     Status: Abnormal   Collection Time: 06/06/21  4:25 PM  Result Value Ref Range   Glucose-Capillary 118 (H) 70 - 99 mg/dL    Comment: Glucose reference range applies only to samples taken after fasting for at least 8 hours.  Glucose, capillary     Status: Abnormal   Collection Time: 06/06/21  7:27 PM  Result Value Ref Range   Glucose-Capillary 120 (H) 70 - 99 mg/dL    Comment: Glucose reference range applies only to samples taken after fasting for at least 8 hours.  Glucose, capillary     Status: Abnormal   Collection Time: 06/06/21 11:13 PM  Result Value Ref Range   Glucose-Capillary 109 (H) 70 - 99 mg/dL    Comment: Glucose reference range applies only to samples taken after fasting for at least 8 hours.  Glucose, capillary     Status: Abnormal   Collection Time: 06/07/21  3:36 AM  Result Value Ref Range   Glucose-Capillary 120 (H) 70 - 99 mg/dL    Comment: Glucose reference range applies only to samples taken after fasting for at least 8 hours.  CBC with Differential/Platelet     Status: Abnormal   Collection Time: 06/07/21  5:17 AM  Result Value Ref Range   WBC 8.5 4.0 - 10.5 K/uL   RBC  3.75 (L) 4.22 - 5.81 MIL/uL   Hemoglobin 12.4 (L) 13.0 - 17.0 g/dL   HCT 25.0 (L) 53.9 - 76.7 %   MCV 101.6 (H) 80.0 - 100.0 fL   MCH 33.1 26.0 - 34.0 pg   MCHC 32.5 30.0 - 36.0 g/dL   RDW 34.1 93.7 - 90.2 %   Platelets 634 (H) 150 - 400 K/uL   nRBC 0.0 0.0 - 0.2 %   Neutrophils Relative % 71 %   Neutro Abs 6.1 1.7 - 7.7 K/uL   Lymphocytes Relative 17 %   Lymphs Abs 1.4 0.7 - 4.0 K/uL   Monocytes Relative 8 %   Monocytes Absolute 0.7 0.1 - 1.0 K/uL   Eosinophils Relative 3 %   Eosinophils Absolute 0.2 0.0 - 0.5 K/uL   Basophils Relative 1 %   Basophils Absolute 0.1 0.0 - 0.1 K/uL   Immature Granulocytes 0 %   Abs Immature Granulocytes 0.03 0.00 - 0.07 K/uL    Comment: Performed at [facility], [address] [place], [name] [address]  Glucose, capillary     Status: Abnormal   Collection Time: 06/07/21  7:47 AM  Result Value Ref Range   Glucose-Capillary 138 (H) 70 - 99 mg/dL    Comment: Glucose reference range applies only to samples taken after fasting for at least 8 hours.   No results found.  Assessment/Plan Obstructive hydrocephalus s/p EVD 05/19/21 -  no documented history of abdominal surgery in his chart, no surgical scars on exam, normal bowel gas pattern on KUB 5/27 - will discuss anticipated timing of VP shunt with neurosurgery and plan to be available to assist in OR.    FEN: DYS1, jevity TF ID: none VTE: SCD's, SQH  Foley: external cath  Dispo: ICU  SAH due to ruptured anterior communicating artery aneurysm  Post-SAH vasospasm Acute encephalopathy  Hyponatremia    Adam Phenix, [facility] Surgery Please see Amion for pager number during day hours  7:00am-4:30pm 06/07/2021, 10:59 AM

## 2021-06-08 ENCOUNTER — Inpatient Hospital Stay (HOSPITAL_COMMUNITY): Payer: 59 | Admitting: Anesthesiology

## 2021-06-08 ENCOUNTER — Encounter (HOSPITAL_COMMUNITY): Payer: Self-pay | Admitting: Neurosurgery

## 2021-06-08 ENCOUNTER — Encounter (HOSPITAL_COMMUNITY)
Admission: EM | Disposition: A | Discharge status: Skilled Nursing Facility | Payer: Self-pay | Source: Home / Self Care | Attending: Neurosurgery

## 2021-06-08 DIAGNOSIS — R4182 Altered mental status, unspecified: Secondary | ICD-10-CM | POA: Diagnosis not present

## 2021-06-08 HISTORY — PX: VENTRICULOPERITONEAL SHUNT: SHX204

## 2021-06-08 HISTORY — PX: LAPAROSCOPIC REVISION VENTRICULAR-PERITONEAL (V-P) SHUNT: SHX5924

## 2021-06-08 LAB — GLUCOSE, CAPILLARY
Glucose-Capillary: 108 mg/dL — ABNORMAL HIGH (ref 70–99)
Glucose-Capillary: 118 mg/dL — ABNORMAL HIGH (ref 70–99)
Glucose-Capillary: 118 mg/dL — ABNORMAL HIGH (ref 70–99)
Glucose-Capillary: 120 mg/dL — ABNORMAL HIGH (ref 70–99)
Glucose-Capillary: 148 mg/dL — ABNORMAL HIGH (ref 70–99)
Glucose-Capillary: 92 mg/dL (ref 70–99)

## 2021-06-08 LAB — BASIC METABOLIC PANEL
Anion gap: 6 (ref 5–15)
BUN: 15 mg/dL (ref 6–20)
CO2: 28 mmol/L (ref 22–32)
Calcium: 9.9 mg/dL (ref 8.9–10.3)
Chloride: 110 mmol/L (ref 98–111)
Creatinine, Ser: 0.57 mg/dL — ABNORMAL LOW (ref 0.61–1.24)
GFR, Estimated: >60 mL/min (ref >60)
Glucose, Bld: 117 mg/dL — ABNORMAL HIGH (ref 70–99)
Potassium: 3.9 mmol/L (ref 3.5–5.1)
Sodium: 144 mmol/L (ref 135–145)

## 2021-06-08 LAB — CBC
HCT: 39.3 % (ref 39.0–52.0)
Hemoglobin: 12.9 g/dL — ABNORMAL LOW (ref 13.0–17.0)
MCH: 33 pg (ref 26.0–34.0)
MCHC: 32.8 g/dL (ref 30.0–36.0)
MCV: 100.5 fL — ABNORMAL HIGH (ref 80.0–100.0)
Platelets: 473 10*3/uL — ABNORMAL HIGH (ref 150–400)
RBC: 3.91 MIL/uL — ABNORMAL LOW (ref 4.22–5.81)
RDW: 12.8 % (ref 11.5–15.5)
WBC: 11.1 10*3/uL — ABNORMAL HIGH (ref 4.0–10.5)
nRBC: 0 % (ref 0.0–0.2)

## 2021-06-08 LAB — SURGICAL PCR SCREEN
MRSA, PCR: NEGATIVE
Staphylococcus aureus: NEGATIVE

## 2021-06-08 SURGERY — SHUNT INSERTION VENTRICULAR-PERITONEAL
Anesthesia: General | Site: Head | Laterality: Right

## 2021-06-08 MED ORDER — BUPIVACAINE-EPINEPHRINE (PF) 0.25% -1:200000 IJ SOLN
INTRAMUSCULAR | Status: AC
  Filled 2021-06-08: qty 30

## 2021-06-08 MED ORDER — 0.9 % SODIUM CHLORIDE (POUR BTL) OPTIME
TOPICAL | Status: DC | PRN
  Administered 2021-06-08: 1000 mL

## 2021-06-08 MED ORDER — LIDOCAINE 2% (20 MG/ML) 5 ML SYRINGE
INTRAMUSCULAR | Status: DC | PRN
  Administered 2021-06-08: 80 mg via INTRAVENOUS

## 2021-06-08 MED ORDER — BACITRACIN ZINC 500 UNIT/GM EX OINT
TOPICAL_OINTMENT | CUTANEOUS | Status: DC | PRN
  Administered 2021-06-08: 1 via TOPICAL

## 2021-06-08 MED ORDER — BUPIVACAINE HCL (PF) 0.5 % IJ SOLN
INTRAMUSCULAR | Status: DC | PRN
  Administered 2021-06-08: 5 mL

## 2021-06-08 MED ORDER — ONDANSETRON HCL 4 MG/2ML IJ SOLN: 4.0000 mg | Freq: Once | INTRAMUSCULAR | Status: DC | PRN

## 2021-06-08 MED ORDER — LIDOCAINE-EPINEPHRINE 1 %-1:100000 IJ SOLN
INTRAMUSCULAR | Status: AC
  Filled 2021-06-08: qty 1

## 2021-06-08 MED ORDER — HYDROMORPHONE HCL 1 MG/ML IJ SOLN: 0.2500 mg | INTRAMUSCULAR | Status: DC | PRN

## 2021-06-08 MED ORDER — ONDANSETRON HCL 4 MG/2ML IJ SOLN
INTRAMUSCULAR | Status: DC | PRN
  Administered 2021-06-08: 4 mg via INTRAVENOUS

## 2021-06-08 MED ORDER — THROMBIN 5000 UNITS EX SOLR: OROMUCOSAL | Status: DC | PRN

## 2021-06-08 MED ORDER — FENTANYL CITRATE (PF) 250 MCG/5ML IJ SOLN
INTRAMUSCULAR | Status: DC | PRN
  Administered 2021-06-08 (×2): 50 ug via INTRAVENOUS

## 2021-06-08 MED ORDER — BUPIVACAINE-EPINEPHRINE 0.25% -1:200000 IJ SOLN
INTRAMUSCULAR | Status: DC | PRN
  Administered 2021-06-08: 8 mL

## 2021-06-08 MED ORDER — DEXAMETHASONE SODIUM PHOSPHATE 10 MG/ML IJ SOLN
INTRAMUSCULAR | Status: AC
  Filled 2021-06-08: qty 1

## 2021-06-08 MED ORDER — ONDANSETRON HCL 4 MG/2ML IJ SOLN
INTRAMUSCULAR | Status: AC
  Filled 2021-06-08: qty 2

## 2021-06-08 MED ORDER — PROPOFOL 10 MG/ML IV BOLUS
INTRAVENOUS | Status: AC
  Filled 2021-06-08: qty 20

## 2021-06-08 MED ORDER — LACTATED RINGERS IV SOLN: INTRAVENOUS | Status: DC | PRN

## 2021-06-08 MED ORDER — CEFAZOLIN SODIUM-DEXTROSE 2-3 GM-%(50ML) IV SOLR
INTRAVENOUS | Status: DC | PRN
  Administered 2021-06-08: 2 g via INTRAVENOUS

## 2021-06-08 MED ORDER — DEXAMETHASONE SODIUM PHOSPHATE 10 MG/ML IJ SOLN
INTRAMUSCULAR | Status: DC | PRN
  Administered 2021-06-08: 5 mg via INTRAVENOUS

## 2021-06-08 MED ORDER — FENTANYL CITRATE (PF) 100 MCG/2ML IJ SOLN
INTRAMUSCULAR | Status: AC
  Filled 2021-06-08: qty 2

## 2021-06-08 MED ORDER — SODIUM CHLORIDE 1 G PO TABS
1.0000 g | ORAL_TABLET | Freq: Two times a day (BID) | ORAL | Status: DC
  Administered 2021-06-08 – 2021-07-05 (×54): 1 g
  Filled 2021-06-08 (×57): qty 1

## 2021-06-08 MED ORDER — FENTANYL CITRATE (PF) 250 MCG/5ML IJ SOLN
INTRAMUSCULAR | Status: AC
  Filled 2021-06-08: qty 5

## 2021-06-08 MED ORDER — SUGAMMADEX SODIUM 200 MG/2ML IV SOLN
INTRAVENOUS | Status: DC | PRN
  Administered 2021-06-08: 250 mg via INTRAVENOUS

## 2021-06-08 MED ORDER — BUPIVACAINE HCL (PF) 0.5 % IJ SOLN
INTRAMUSCULAR | Status: AC
  Filled 2021-06-08: qty 30

## 2021-06-08 MED ORDER — PROPOFOL 10 MG/ML IV BOLUS
INTRAVENOUS | Status: DC | PRN
  Administered 2021-06-08: 100 mg via INTRAVENOUS

## 2021-06-08 MED ORDER — LIDOCAINE-EPINEPHRINE 1 %-1:100000 IJ SOLN
INTRAMUSCULAR | Status: DC | PRN
  Administered 2021-06-08: 5 mL

## 2021-06-08 MED ORDER — PHENYLEPHRINE 40 MCG/ML (10ML) SYRINGE FOR IV PUSH (FOR BLOOD PRESSURE SUPPORT)
PREFILLED_SYRINGE | INTRAVENOUS | Status: DC | PRN
  Administered 2021-06-08: 120 ug via INTRAVENOUS
  Administered 2021-06-08: 80 ug via INTRAVENOUS

## 2021-06-08 MED ORDER — ROCURONIUM BROMIDE 10 MG/ML (PF) SYRINGE
PREFILLED_SYRINGE | INTRAVENOUS | Status: DC | PRN
  Administered 2021-06-08: 50 mg via INTRAVENOUS

## 2021-06-08 MED ORDER — BACITRACIN ZINC 500 UNIT/GM EX OINT
TOPICAL_OINTMENT | CUTANEOUS | Status: AC
  Filled 2021-06-08: qty 28.35

## 2021-06-08 MED ORDER — CEFAZOLIN SODIUM-DEXTROSE 2-4 GM/100ML-% IV SOLN
INTRAVENOUS | Status: AC
  Filled 2021-06-08: qty 100

## 2021-06-08 MED ORDER — ACETAMINOPHEN 500 MG PO TABS: 1000.0000 mg | ORAL_TABLET | Freq: Once | ORAL | Status: DC

## 2021-06-08 MED ORDER — FENTANYL CITRATE (PF) 100 MCG/2ML IJ SOLN
25.0000 ug | INTRAMUSCULAR | Status: DC | PRN
  Administered 2021-06-08: 25 ug via INTRAVENOUS

## 2021-06-08 MED ORDER — MUPIROCIN 2 % EX OINT: 1.0000 "application " | TOPICAL_OINTMENT | Freq: Two times a day (BID) | CUTANEOUS | Status: DC

## 2021-06-08 MED ORDER — THROMBIN 5000 UNITS EX SOLR
CUTANEOUS | Status: AC
  Filled 2021-06-08: qty 5000

## 2021-06-08 SURGICAL SUPPLY — 79 items
ADH SKN CLS APL DERMABOND .7 (GAUZE/BANDAGES/DRESSINGS) ×2
BLADE CLIPPER SURG (BLADE) ×8 IMPLANT
BLADE SURG 11 STRL SS (BLADE) ×8 IMPLANT
BOOT SUTURE AID YELLOW STND (SUTURE) ×2 IMPLANT
BUR PRECISION FLUTE 5.0 (BURR) ×4 IMPLANT
CANISTER SUCT 3000ML PPV (MISCELLANEOUS) ×4 IMPLANT
CARTRIDGE OIL MAESTRO DRILL (MISCELLANEOUS) ×2 IMPLANT
CATH SNAP PUDENZ 6CM (Shunt) ×2 IMPLANT
CLIP RANEY DISP (INSTRUMENTS) IMPLANT
CLOSURE WOUND 1/2 X4 (GAUZE/BANDAGES/DRESSINGS)
COVER WAND RF STERILE (DRAPES) ×4 IMPLANT
DECANTER SPIKE VIAL GLASS SM (MISCELLANEOUS) ×10 IMPLANT
DERMABOND ADVANCED (GAUZE/BANDAGES/DRESSINGS) ×2
DERMABOND ADVANCED .7 DNX12 (GAUZE/BANDAGES/DRESSINGS) ×2 IMPLANT
DIFFUSER DRILL AIR PNEUMATIC (MISCELLANEOUS) ×4 IMPLANT
DRAPE HALF SHEET 40X57 (DRAPES) ×4 IMPLANT
DRAPE INCISE IOBAN 66X45 STRL (DRAPES) ×4 IMPLANT
DRAPE ORTHO SPLIT 77X108 STRL (DRAPES) ×8
DRAPE SURG ORHT 6 SPLT 77X108 (DRAPES) ×4 IMPLANT
DRSG OPSITE POSTOP 3X4 (GAUZE/BANDAGES/DRESSINGS) ×4 IMPLANT
DURAPREP 26ML APPLICATOR (WOUND CARE) ×8 IMPLANT
ELECT REM PT RETURN 9FT ADLT (ELECTROSURGICAL) ×4
ELECTRODE REM PT RTRN 9FT ADLT (ELECTROSURGICAL) ×2 IMPLANT
GAUZE 4X4 16PLY RFD (DISPOSABLE) IMPLANT
GLOVE BIO SURGEON STRL SZ7.5 (GLOVE) IMPLANT
GLOVE BIO SURGEON STRL SZ8 (GLOVE) ×4 IMPLANT
GLOVE BIOGEL PI IND STRL 7.5 (GLOVE) ×4 IMPLANT
GLOVE BIOGEL PI INDICATOR 7.5 (GLOVE) ×4
GLOVE ECLIPSE 7.0 STRL STRAW (GLOVE) ×8 IMPLANT
GLOVE EXAM NITRILE XL STR (GLOVE) IMPLANT
GLOVE SRG 8 PF TXTR STRL LF DI (GLOVE) ×2 IMPLANT
GLOVE SURG UNDER POLY LF SZ8 (GLOVE) ×4
GOWN STRL REUS W/ TWL LRG LVL3 (GOWN DISPOSABLE) ×6 IMPLANT
GOWN STRL REUS W/ TWL XL LVL3 (GOWN DISPOSABLE) ×2 IMPLANT
GOWN STRL REUS W/TWL 2XL LVL3 (GOWN DISPOSABLE) IMPLANT
GOWN STRL REUS W/TWL LRG LVL3 (GOWN DISPOSABLE) ×12
GOWN STRL REUS W/TWL XL LVL3 (GOWN DISPOSABLE) ×8
HEMOSTAT POWDER KIT SURGIFOAM (HEMOSTASIS) ×2 IMPLANT
HEMOSTAT SURGICEL 2X14 (HEMOSTASIS) IMPLANT
KIT BASIN OR (CUSTOM PROCEDURE TRAY) ×4 IMPLANT
KIT TURNOVER KIT B (KITS) ×4 IMPLANT
MARKER SKIN DUAL TIP RULER LAB (MISCELLANEOUS) ×4 IMPLANT
NDL HYPO 25X1 1.5 SAFETY (NEEDLE) ×2 IMPLANT
NEEDLE HYPO 22GX1.5 SAFETY (NEEDLE) ×2 IMPLANT
NEEDLE HYPO 25X1 1.5 SAFETY (NEEDLE) ×8 IMPLANT
NS IRRIG 1000ML POUR BTL (IV SOLUTION) ×4 IMPLANT
OIL CARTRIDGE MAESTRO DRILL (MISCELLANEOUS) ×4
PACK LAMINECTOMY NEURO (CUSTOM PROCEDURE TRAY) ×4 IMPLANT
PACK LAPAROSCOPY BASIN (CUSTOM PROCEDURE TRAY) IMPLANT
PAD ARMBOARD 7.5X6 YLW CONV (MISCELLANEOUS) ×12 IMPLANT
PASSER CATH 65CM DISP (NEUROSURGERY SUPPLIES) ×2 IMPLANT
SHEATH COOK PEEL AWAY SET 9F (SHEATH) ×4 IMPLANT
SHEATH PERITONEAL INTRO 61 (SHEATH) ×4 IMPLANT
SHUNT STRATA 11 SNAP REG (Shunt) ×2 IMPLANT
SLEEVE ENDOPATH XCEL 5M (ENDOMECHANICALS) ×4 IMPLANT
SPONGE LAP 4X18 RFD (DISPOSABLE) IMPLANT
SPONGE SURGIFOAM ABS GEL SZ50 (HEMOSTASIS) ×2 IMPLANT
STAPLER SKIN PROX WIDE 3.9 (STAPLE) ×2 IMPLANT
STRIP CLOSURE SKIN 1/2X4 (GAUZE/BANDAGES/DRESSINGS) IMPLANT
SUT ETHILON 3 0 PS 1 (SUTURE) IMPLANT
SUT NURALON 4 0 TR CR/8 (SUTURE) IMPLANT
SUT SILK 0 TIES 10X30 (SUTURE) ×4 IMPLANT
SUT SILK 3 0 SH 30 (SUTURE) IMPLANT
SUT VIC AB 3-0 SH 27 (SUTURE)
SUT VIC AB 3-0 SH 27X BRD (SUTURE) ×2 IMPLANT
SUT VIC AB 3-0 SH 8-18 (SUTURE) ×8 IMPLANT
SUT VIC AB 4-0 PS2 27 (SUTURE) ×4 IMPLANT
SUT VICRYL 0 UR6 27IN ABS (SUTURE) ×4 IMPLANT
SYR CONTROL 10ML LL (SYRINGE) ×4 IMPLANT
TAPE CLOTH SURG 4X10 WHT LF (GAUZE/BANDAGES/DRESSINGS) ×2 IMPLANT
TOWEL GREEN STERILE (TOWEL DISPOSABLE) ×6 IMPLANT
TOWEL GREEN STERILE FF (TOWEL DISPOSABLE) ×4 IMPLANT
TRAY LAPAROSCOPIC (CUSTOM PROCEDURE TRAY) ×2 IMPLANT
TROCAR XCEL BLUNT TIP 100MML (ENDOMECHANICALS) IMPLANT
TROCAR XCEL NON-BLD 5MMX100MML (ENDOMECHANICALS) ×6 IMPLANT
TUBE CONNECTING 12'X1/4 (SUCTIONS)
TUBE CONNECTING 12X1/4 (SUCTIONS) ×2 IMPLANT
UNDERPAD 30X36 HEAVY ABSORB (UNDERPADS AND DIAPERS) ×2 IMPLANT
WATER STERILE IRR 1000ML POUR (IV SOLUTION) ×6 IMPLANT

## 2021-06-08 NOTE — Op Note (Signed)
  05/19/2021 - 06/08/2021  1:51 PM  PATIENT:  [name] [name]  60 y.o. male  PRE-OPERATIVE DIAGNOSIS:  HYDROCEPHALUS  POST-OPERATIVE DIAGNOSIS:  HYDROCEPHALUS  PROCEDURE:  Procedure(s): SHUNT INSERTION VENTRICULAR-PERITONEAL LAPAROSCOPIC ASSISTED VENTRICULAR-PERITONEAL (V-P) SHUNT  SURGEON:  Surgeon(s): Lisbeth Renshaw, MD Violeta Gelinas, MD Co-surgeons ASSISTANTS: none   ANESTHESIA:   local and general  EBL:  Total I/O In: 90 [NG/GT:90] Out: 0   BLOOD ADMINISTERED:none  DRAINS: none   SPECIMEN:  No Specimen  DISPOSITION OF SPECIMEN:  N/A  COUNTS:  YES  DICTATION: .Dragon Dictation Procedure in detail: Informed consent was obtained.  The patient was brought to the operating room and general endotracheal anesthesia was administered by the anesthesia staff.  He was positioned for Dr. Conchita Paris, prepped, and draped.  We did a timeout procedure.  I am dictating my portion of the procedure.  I injected local superior to his umbilicus.  Subcutaneous tissues were dissected down revealing the anterior fascia.  This was divided along the midline.  The peritoneal cavity was entered under direct vision.  A pursestring 0 Vicryl was placed around the fascial opening and the [facility] trocar was inserted.  The abdomen was insufflated with carbon dioxide in standard fashion.  Under direct vision, I placed a 5 mm left upper quadrant port.  I then located a good point in the right upper quadrant to pass the catheter in and made a small incision there..  The catheter was tunneled by Dr. Conchita Paris.  Once it was flushed appropriately I used the peel-away sheath and placed this into the abdomen under direct vision.  I threaded the catheter down the sheath and peeled the sheath away.  The catheter was observed to have CSF flowing.  The end was positioned into the pelvis.  Four-quadrant inspection revealed no complications.  Ports were removed under direct vision.  Pneumoperitoneum was released.   Pursestring was tied to close the fascia above the umbilicus.  All 3 wounds were closed with subcuticular 4-0 Vicryl followed by Dermabond.  All counts were correct.  He tolerated the procedure without apparent complication and was taken recovery in stable condition. PATIENT DISPOSITION:  PACU - hemodynamically stable.   Delay start of Pharmacological VTE agent (>24hrs) due to surgical blood loss or risk of bleeding:  no  Violeta Gelinas, MD, MPH, FACS Pager: [phone]  6/14/20221:51 PM

## 2021-06-08 NOTE — Progress Notes (Addendum)
NAME:  [name] [name], MRN:  [id number], DOB:  [birth year], LOS: 20 ADMISSION DATE:  05/19/2021, CONSULTATION DATE:  05/20/21 REFERRING MD:  Lisbeth Renshaw CHIEF COMPLAINT:  SAH  Brief Narrative:  Goes by [name] [name]"  60 year old man presented to ED for frontal headache  CT Head on admission with SAH and CTA showed 53mm aneurysm from anterior communicating artery, thought to have ruptured and caused hemorrhage. Ventriculostomy done at bedside in ED.  S/p coiling on 5/26.   Significant Hospital Events:  5/25 Admitted with [facility] secondary to ruptured aneurysm, ventriculostomy preformed in ED, Ancef started 5/26 Coil Embolization 5/29 worsening neurological exam consistent with vasospasm, hemodynamic augmentation initiated. 6/1 worsening hyponatremia: Na of 120.  Patient started on salt tablets 1 g twice daily and fludrocortisone 0.1 mg. 6/2 BP goal reduced 160 -180.  Salt Tablet increased to 1 g tid. Fludrocortisone increased 0.2 mg. 6/8 Ongoing agitation, intermittently following commands. CT Head with worsening hydrocephalus despite EVD, open to 10 per NSGY. 6/9 EVD remains in place, repeat head CT as below  6/10-12, no significant neuro changes, intermittently agitated.  Ongoing EVD, plans for VP shunt next week 6/13 no neuro changes, remains intermittently restless  Interim History/Subjective:  Plan for VP shunt today w/Dr. Conchita Paris around noon EVD clamped since midnight No changes- tracking this morning but will not f/c, MAE Afebrile   Objective   Blood pressure 121/84, pulse 86, temperature 98 F (36.7 C), temperature source Axillary, resp. rate 14, height 5\' 9"  (1.753 m), weight 60.5 kg, SpO2 99 %.        Intake/Output Summary (Last 24 hours) at 06/08/2021 0807 Last data filed at 06/08/2021 0700 Gross per 24 hour  Intake 1495 ml  Output 1094 ml  Net 401 ml   Filed Weights   06/06/21 1030 06/07/21 0500 06/08/21 0500  Weight: 56.4 kg 60.7 kg 60.5 kg   Physical  Examination: General:  chronically ill thin elderly male lying in bed, restless, keeps leg drawn up, remains in posey belt, soft wrist and mittens  HEENT: MM pink/moist, pupils 4/sluggish, clamped R EVD, left nare cortrak Neuro: tracks, but will not f/c, MAE good strenght CV: NSR, no movement PULM:  non labored, on room air, clear, diminished in bases GI: soft, +BS, NT, urinary pouch in place Extremities: warm/dry, no LE edema  Skin: no rashes    Labs/imaging that I have personally reviewed:  6/14 CBC - WBC 8.5-> 11.5, BMET   Resolved Hospital Problem list     Assessment & Plan:  Aneurysmal subarachnoid hemorrhage  -due to ruptured anterior communicating artery aneurysm status post coil embolization on 5/26 Symptomatic vasospasm post SAH Obstructive hydrocephalus s/p EVD Acute encephalopathy due to subarachnoid hemorrhage -patient remains intermittently agitated and restless in the bed vs. somnolent. P: - plans for VP shunt placement today around noon with NSGY.  EVD clamped since midnight - ongoing nimodipine per cortrak - currently NPO since midnight  - q 4 neuro checks  - Maintain neuro protective measures; goal for eurothermia, euglycemia, eunatermia, normoxia, and PCO2 goal of 35-40 - Nutrition and bowel regiment  - Seizure precautions  - Aspirations precautions  - ?may be able to transfer to PCU post-op, will defer to NSGY, if so will defer to [facility] for primary medical needs.  Will discuss with NSGY, PCCM will continue to follow while in ICU.  Hyponatremia- suspected cerebral salt wasting Hypokalemia P: - trend renal indices - trend I/Os - Na 135-> 137-> today 144 - decrease salt tabs  from 2-> 1gm TID; florinef stopped 6/13  Diarrhea - resolved P: - prn loperamide - continue nutrisource fiber  Physical deconditioning in the setting of SAH P: - PT/OT efforts as able  - likely CIR candidate   At risk maturation P: - holding TF via cortrak while NPO, defer to  NSGY - ongoing SLP efforts  Leukocytosis - afebrile, clinically monitor for now - trend CBC  Best practice   Diet:  per SLP Pain/Anxiety/Delirium protocol (if indicated): No VAP protocol (if indicated): Not indicated DVT prophylaxis: SCD, heparin SQ GI prophylaxis: PPI Glucose control:  SSI No Central venous access:  yes, R PICC still needed Arterial line:  N/A Foley:  N/A Mobility:  As tolerated, PT/ OT PT consulted:  Yes Last date of multidisciplinary goals of care discussion  per primary, no family at bedside 6/14 Code Status:  full code Disposition: ICU  Critical care time: NA     Posey Boyer, ACNP [facility] 06/08/2021, 8:07 AM

## 2021-06-08 NOTE — Progress Notes (Signed)
OT Cancellation Note  Patient Details Name: [name] [name] MRN: [id number] DOB: January 03, 1961   Cancelled Treatment:    Reason Eval/Treat Not Completed: Other (comment) (VP shunt today -- will reeval pending orders after procedure)  Wynona Neat, OTR/L  Acute Rehabilitation Services Pager: [phone] Office: [phone] .  06/08/2021, 7:37 AM

## 2021-06-08 NOTE — Progress Notes (Signed)
Nutrition Follow-up  DOCUMENTATION CODES:   Not applicable  INTERVENTION:   Recommend PEG placement  Tube Feeding via Cortrak:  Jevity 1.2 at 65 ml/hr (1560 ml per day) 45 ml ProSource TF BID  Provides 1907 kcal, 109 gm protein, 1259 ml free water daily  NUTRITION DIAGNOSIS:   Inadequate oral intake related to inability to eat as evidenced by NPO status.  Being addressed via TF   GOAL:   Patient will meet greater than or equal to 90% of their needs  Progressing   MONITOR:   Diet advancement, Labs, Weight trends, TF tolerance, Skin, I & O's  REASON FOR ASSESSMENT:   Other (Comment)    ASSESSMENT:   Pt is a 60 you w/ PMH sinusitis, rotator cuff tear, tobacco, marijuana, and ETOH use, remote hx of crack cocaine use who is being managed for intraventricular hemorrhage w/ large acute SAH now s/p ventriculostomy.  6/14 VP shunt placed today  NPO Tolerating Jevity 1.5 at 65 ml/hr via Cortrak.   Current wt 60.5 kg; weight relatively stable since last visit  No new wounds noted  Labs: reviewed Meds: nutrisouce fiber, NaCL tab   Diet Order:   Diet Order             Diet NPO time specified  Diet effective midnight                   EDUCATION NEEDS:   Education needs have been addressed  Skin:  Skin Assessment: Skin Integrity Issues: Skin Integrity Issues:: Other (Comment) Incisions: closed rt groin Other: MASD scrotum  Last BM:  6/13 rectal tube out  Height:   Ht Readings from Last 1 Encounters:  05/20/21 5\' 9"  (1.753 m)    Weight:   Wt Readings from Last 1 Encounters:  06/08/21 60.5 kg    Ideal Body Weight:  72.7 kg  BMI:  Body mass index is 19.7 kg/m.  Estimated Nutritional Needs:   Kcal:  1800-2100 kcals  Protein:  90-110 g  Fluid:  >/= 1.8 L  06/10/21 MS, RDN, LDN, CNSC Registered Dietitian III Clinical Nutrition RD Pager and On-Call Pager Number Located in [place]

## 2021-06-08 NOTE — Progress Notes (Signed)
Patient ID: AZARIUS LAMBSON, male   DOB: March 25, 1961, 59 y.o.   MRN: [id number] 19 Days Post-Op   Subjective: Does not respond ROS negative except as listed above. Objective: Vital signs in last 24 hours: Temp:  [97.1 F (36.2 C)-98.1 F (36.7 C)] 97.9 F (36.6 C) (06/14 0800) Pulse Rate:  [83-99] 92 (06/14 0800) Resp:  [14-28] 28 (06/14 0800) BP: (109-153)/(79-106) 132/97 (06/14 0800) SpO2:  [98 %-100 %] 100 % (06/14 0800) Weight:  [60.5 kg] 60.5 kg (06/14 0500) Last BM Date: 06/07/21  Intake/Output from previous day: 06/13 0701 - 06/14 0700 In: 1625 [NG/GT:1625] Out: 1097 [Urine:1050; Drains:47] Intake/Output this shift: Total I/O In: 90 [NG/GT:90] Out: 0   General appearance: no distress Head: ventriculostomy GI: soft, NT  Lab Results: CBC  Recent Labs    06/07/21 0517 06/08/21 0620  WBC 8.5 11.1*  HGB 12.4* 12.9*  HCT 38.1* 39.3  PLT 634* 473*   BMET Recent Labs    06/08/21 0620  NA 144  K 3.9  CL 110  CO2 28  GLUCOSE 117*  BUN 15  CREATININE 0.57*  CALCIUM 9.9   PT/INR No results for input(s): LABPROT, INR in the last 72 hours. ABG No results for input(s): PHART, HCO3 in the last 72 hours.  Invalid input(s): PCO2, PO2  Studies/Results: No results found.  Anti-infectives: Anti-infectives (From admission, onward)    Start     Dose/Rate Route Frequency Ordered Stop   05/20/21 0645  ceFAZolin (ANCEF) IVPB 1 g/50 mL premix  Status:  Discontinued        1 g 100 mL/hr over 30 Minutes Intravenous Every 8 hours 05/20/21 0634 05/27/21 1447   05/19/21 2045  ceFAZolin (ANCEF) IVPB 2g/100 mL premix        2 g 200 mL/hr over 30 Minutes Intravenous To Surgery 05/19/21 2037 05/19/21 2245       Assessment/Plan: Hydrocephalus - I will use a laparoscopic approach to perform the abdominal portion of his VP shunt along with Dr. Conchita Paris. Procedure discussed including risks and benefits with Jama Flavors who has given consent.    LOS: 20 days    Violeta Gelinas, MD, MPH, FACS Trauma & General Surgery Use [email or web address] to contact on call provider  06/08/2021

## 2021-06-08 NOTE — Transfer of Care (Signed)
Immediate Anesthesia Transfer of Care Note  Patient: [name] [name]  Procedure(s) Performed: SHUNT INSERTION VENTRICULAR-PERITONEAL (Head) LAPAROSCOPIC ASSISTED VENTRICULAR-PERITONEAL (V-P) SHUNT (Right: Abdomen)  Patient Location: PACU  Anesthesia Type:General  Level of Consciousness: awake and confused  Airway & Oxygen Therapy: Patient Spontanous Breathing and Patient connected to nasal cannula oxygen  Post-op Assessment: Report given to RN, Post -op Vital signs reviewed and stable and Patient moving all extremities X 4  Post vital signs: Reviewed and stable  Last Vitals:  Vitals Value Taken Time  BP 155/117 06/08/21 1406  Temp    Pulse    Resp 18 06/08/21 1408  SpO2    Vitals shown include unvalidated device data.  Last Pain:  Vitals:   06/08/21 0800  TempSrc: Axillary  PainSc:          Complications: No notable events documented.

## 2021-06-08 NOTE — Progress Notes (Signed)
Inpatient Rehab Admissions Coordinator:   Pt. Continues To tolerate only bed level therapy, and is not appropriate for CIR. Plan for VP shunt 6/14, will follow up afterwards.  Megan Salon, MS, CCC-SLP Rehab Admissions Coordinator  [phone] (celll) [phone] (office)

## 2021-06-08 NOTE — Anesthesia Preprocedure Evaluation (Addendum)
Anesthesia Evaluation  Patient identified by MRN, date of birth, ID band Patient confused  General Assessment Comment:60 y/o M admitted 05/19/21 with SAH secondary to a ruptured aneurysm of the anterior communicating artery. He underwent ventriculostomy in the ED followed by IR coil embolization.  Reviewed: Allergy & Precautions, NPO status , Patient's Chart, lab work & pertinent test results  Airway Mallampati: II  TM Distance: >3 FB Neck ROM: Full    Dental  (+) Teeth Intact, Dental Advisory Given   Pulmonary Current Smoker and Patient abstained from smoking.,    Pulmonary exam normal breath sounds clear to auscultation       Cardiovascular negative cardio ROS Normal cardiovascular exam Rhythm:Regular Rate:Normal     Neuro/Psych HYDROCEPHALUS s/p SAH  Neuromuscular disease    GI/Hepatic negative GI ROS, (+)     substance abuse  alcohol use, cocaine use and marijuana use,   Endo/Other  negative endocrine ROS  Renal/GU negative Renal ROS     Musculoskeletal negative musculoskeletal ROS (+)   Abdominal   Peds  Hematology  (+) Blood dyscrasia, anemia ,   Anesthesia Other Findings   Reproductive/Obstetrics                            Anesthesia Physical Anesthesia Plan  ASA: 3  Anesthesia Plan: General   Post-op Pain Management:    Induction: Intravenous  PONV Risk Score and Plan: 1 and Dexamethasone and Ondansetron  Airway Management Planned: Oral ETT  Additional Equipment:   Intra-op Plan:   Post-operative Plan: Extubation in OR  Informed Consent: I have reviewed the patients History and Physical, chart, labs and discussed the procedure including the risks, benefits and alternatives for the proposed anesthesia with the patient or authorized representative who has indicated his/her understanding and acceptance.     Dental advisory given  Plan Discussed with:  CRNA  Anesthesia Plan Comments:        Anesthesia Quick Evaluation

## 2021-06-08 NOTE — Progress Notes (Signed)
  NEUROSURGERY PROGRESS NOTE   Pt seen and examined. No issues overnight.   EXAM: Temp:  [97.1 F (36.2 C)-98.1 F (36.7 C)] 97.9 F (36.6 C) (06/14 0800) Pulse Rate:  [85-99] 91 (06/14 1000) Resp:  [14-28] 20 (06/14 1000) BP: (109-153)/(79-106) 146/94 (06/14 1000) SpO2:  [98 %-100 %] 100 % (06/14 1000) Weight:  [60.5 kg] 60.5 kg (06/14 0500) Intake/Output      06/13 0701 06/14 0700 06/14 0701 06/15 0700   P.O. 0    NG/GT 1105 90   Total Intake(mL/kg) 1105 (18.3) 90 (1.5)   Urine (mL/kg/hr) 1050 (0.7)    Drains 47 0   Stool 0    Blood  75   Total Output 1097 75   Net +8 +15        Urine Occurrence 1 x    Stool Occurrence 2 x     Drowsy but arouses Non-verbal today occasionally FC, moves both sides purposefully EVD in place, open @ 10, xanthochromic CSF   LABS: Lab Results  Component Value Date   CREATININE 0.57 (L) 06/08/2021   BUN 15 06/08/2021   NA 144 06/08/2021   K 3.9 06/08/2021   CL 110 06/08/2021   CO2 28 06/08/2021   Lab Results  Component Value Date   WBC 11.1 (H) 06/08/2021   HGB 12.9 (L) 06/08/2021   HCT 39.3 06/08/2021   MCV 100.5 (H) 06/08/2021   PLT 473 (H) 06/08/2021    IMPRESSION: - 60 y.o. male SAH d#21 (could be later given visits to ED for HA prior to admission) remains neurologically stable.  - Shunt dependent HCP, will need VP shunt.  - Likely CSW, Na improved  PLAN: - Monitor Na - on fluorinef - Cont Nimotop on Q2 dosing to finish 21d - d/c today - Lap assisted VP shunt today   Lisbeth Renshaw, MD [facility]

## 2021-06-08 NOTE — Anesthesia Procedure Notes (Signed)
Procedure Name: Intubation Date/Time: 06/08/2021 12:55 PM Performed by: Lanell Matar, CRNA Pre-anesthesia Checklist: Patient identified, Emergency Drugs available, Suction available and Patient being monitored Patient Re-evaluated:Patient Re-evaluated prior to induction Oxygen Delivery Method: Circle System Utilized Preoxygenation: Pre-oxygenation with 100% oxygen Induction Type: IV induction Ventilation: Mask ventilation without difficulty and Oral airway inserted - appropriate to patient size Laryngoscope Size: Hyacinth Meeker and 2 Grade View: Grade I Tube type: Oral Number of attempts: 1 Airway Equipment and Method: Stylet and Oral airway Placement Confirmation: ETT inserted through vocal cords under direct vision, positive ETCO2 and breath sounds checked- equal and bilateral Secured at: 23 cm Tube secured with: Tape Dental Injury: Teeth and Oropharynx as per pre-operative assessment

## 2021-06-09 ENCOUNTER — Encounter (HOSPITAL_COMMUNITY): Payer: Self-pay | Admitting: Neurosurgery

## 2021-06-09 DIAGNOSIS — R4182 Altered mental status, unspecified: Secondary | ICD-10-CM | POA: Diagnosis not present

## 2021-06-09 LAB — GLUCOSE, CAPILLARY
Glucose-Capillary: 128 mg/dL — ABNORMAL HIGH (ref 70–99)
Glucose-Capillary: 150 mg/dL — ABNORMAL HIGH (ref 70–99)
Glucose-Capillary: 129 mg/dL — ABNORMAL HIGH (ref 70–99)
Glucose-Capillary: 120 mg/dL — ABNORMAL HIGH (ref 70–99)
Glucose-Capillary: 138 mg/dL — ABNORMAL HIGH (ref 70–99)
Glucose-Capillary: 112 mg/dL — ABNORMAL HIGH (ref 70–99)

## 2021-06-09 NOTE — Progress Notes (Signed)
Occupational Therapy Treatment Patient Details Name: [name] [name] MRN: [id number] DOB: Jan 08, 1961 Today's Date: 06/09/2021    History of present illness Pt is a 60 y.o. M who was admitted to ICU 5/25 with IVH with large acute SAH now s/p ventriculostomy. 5/26 coil embolization. 5/29 worsening neurological exam consistent with vasospasm. VP shunt placement 6/14. Significant PMH: rotator cuff tear, tobacco, marijuana and ETOH use, remote hx crack cocaine use.   OT comments  Pt progressing to EOB and ultimately chair position in bed. Pt standing x2 times with bila knees blocked x10 secs each with minima initiation. Pt following 25% of commands with increased alertness with positional change to EOB. Pt nodding head appropriately to questions and verbalizing "Am I going to get better?" To MD in room. Pt squeezing bilat hands and attempting to give thumbs up. Pt activating core muscles with righting reactions at EOB with grooming and reaching tasks. Pt continues to have posterior lean and R lateral lean with sitting EOB. Pt would greatly benefit from continued OT skilled services. OT following acutely.     Follow Up Recommendations  CIR    Equipment Recommendations  3 in 1 bedside commode    Recommendations for Other Services      Precautions / Restrictions Precautions Precautions: Fall Precaution Comments: BP > 100, wrist restraints, mittens, bed posey Restrictions Weight Bearing Restrictions: No       Mobility Bed Mobility Overal bed mobility: Needs Assistance Bed Mobility: Sidelying to Sit;Supine to Sit     Supine to sit: +2 for physical assistance;Max assist Sit to supine: +2 for physical assistance;Max assist   General bed mobility comments: initiation on trunk to come to sitting and for sit to supine; totalA for BLEs to come onto bed    Transfers Overall transfer level: Needs assistance Equipment used: 2 person hand held assist Transfers: Sit to/from Stand Sit to  Stand: Max assist;+2 physical assistance         General transfer comment: Minimal intiation noted; bilat knees blocked    Balance Overall balance assessment: Needs assistance Sitting-balance support: No upper extremity supported;Feet supported;Bilateral upper extremity supported Sitting balance-Leahy Scale: Poor Sitting balance - Comments: Pt with posterior and right lateral lean. PT performing reaching, grooming and balance challenges at EOB                                   ADL either performed or assessed with clinical judgement   ADL Overall ADL's : Needs assistance/impaired     Grooming: Maximal assistance;Sitting;Wash/dry face Grooming Details (indicate cue type and reason): attempt at EOB: holding wash cloth, but not washing face.                 Toilet Transfer: Maximal assistance;+2 for physical assistance;+2 for safety/equipment;Squat-pivot Toilet Transfer Details (indicate cue type and reason): sit to stand x2 times from bed; bilat knees blocked         Functional mobility during ADLs: Maximal assistance;+2 for physical assistance;+2 for safety/equipment General ADL Comments: Pt sitting EOB for dynamic sitting balance tasks; pt righting reactions and mostly with R lean and posterior bias at times.     Vision   Vision Assessment?: Vision impaired- to be further tested in functional context Additional Comments: continue to assess; pt scanning, but easily distracted; continues to look L, but difficulty looking to R past midline, inconsistent.   Perception     Praxis  Cognition Arousal/Alertness: Awake/alert Behavior During Therapy: Flat affect;Restless;Impulsive Overall Cognitive Status: Impaired/Different from baseline Area of Impairment: Following commands;Attention;Awareness;Safety/judgement;Problem solving                   Current Attention Level: Sustained   Following Commands: Follows one step commands  inconsistently;Follows one step commands with increased time Safety/Judgement: Decreased awareness of safety;Decreased awareness of deficits Awareness: Intellectual Problem Solving: Decreased initiation;Difficulty sequencing;Requires verbal cues General Comments: Pt increasing alertness in upright posture following ~25% of commands.  Pt asking MD in room "will I get better?" Pt nodding yes to "are you thirsty?" Pt asked to blink if you had fun today and pt shut both eyes for 1-2 secs appeared to be deliberate. Pt squeezing with bilat hands and attempting to wash face, but unable to coordinate hands with holding washcloth. Pt attempting to give OT a thumbs up with R hand.        Exercises Exercises: Other exercises Other Exercises Other Exercises: reaching tasks in sitting   Shoulder Instructions       General Comments VSS on RA.    Pertinent Vitals/ Pain       Pain Assessment: Faces Faces Pain Scale: Hurts little more Pain Location: BLE feet Pain Descriptors / Indicators: Tender;Discomfort;Guarding Pain Intervention(s): Monitored during session;Repositioned  Home Living                                          Prior Functioning/Environment              Frequency  Min 2X/week        Progress Toward Goals  OT Goals(current goals can now be found in the care plan section)  Progress towards OT goals: Progressing toward goals  Acute Rehab OT Goals Patient Stated Goal: unable OT Goal Formulation: Patient unable to participate in goal setting Time For Goal Achievement: 06/14/21 Potential to Achieve Goals: Good ADL Goals Pt Will Perform Grooming: with min assist;sitting Pt Will Perform Lower Body Dressing: with mod assist;sitting/lateral leans;sit to/from stand Pt Will Perform Toileting - Clothing Manipulation and hygiene: with mod assist;sit to/from stand Pt/caregiver will Perform Home Exercise Program: Increased ROM;Increased strength;Both right and  left upper extremity;With minimal assist;With written HEP provided Additional ADL Goal #1: Pt will follow 1-2 step commands with minimal cues to attend to task in 3/5 trials. Additional ADL Goal #2: Pt will utilize strategies for L inattention in order to participate with tasks on L side with minimal cues in 3/5 trials.  Plan Discharge plan remains appropriate    Co-evaluation    PT/OT/SLP Co-Evaluation/Treatment: Yes Reason for Co-Treatment: To address functional/ADL transfers;For patient/therapist safety;Necessary to address cognition/behavior during functional activity   OT goals addressed during session: ADL's and self-care      AM-PAC OT "6 Clicks" Daily Activity     Outcome Measure   Help from another person eating meals?: A Lot Help from another person taking care of personal grooming?: A Lot Help from another person toileting, which includes using toliet, bedpan, or urinal?: Total Help from another person bathing (including washing, rinsing, drying)?: A Lot Help from another person to put on and taking off regular upper body clothing?: A Lot Help from another person to put on and taking off regular lower body clothing?: Total 6 Click Score: 10    End of Session Equipment Utilized During Treatment: Gait belt  OT Visit  Diagnosis: Unsteadiness on feet (R26.81);Muscle weakness (generalized) (M62.81);Other symptoms and signs involving cognitive function   Activity Tolerance Patient tolerated treatment well   Patient Left in bed;with call bell/phone within reach;with bed alarm set;with restraints reapplied;Other (comment) (in chair position)   Nurse Communication Mobility status        Time: 4332-9518 OT Time Calculation (min): 32 min  Charges: OT General Charges $OT Visit: 1 Visit OT Treatments $Neuromuscular Re-education: 8-22 mins  Flora Lipps, OTR/L Acute Rehabilitation Services Pager: [phone] Office: [phone]    Bradon Fester C 06/09/2021, 9:40 AM

## 2021-06-09 NOTE — Progress Notes (Signed)
  NEUROSURGERY PROGRESS NOTE   No issues overnight.   EXAM:  BP 135/85 (BP Location: Left Arm)   Pulse 96   Temp 97.8 F (36.6 C) (Axillary)   Resp (!) 23   Ht 5\' 9"  (1.753 m)   Wt 60 kg   SpO2 100%   BMI 19.53 kg/m   Awake, alert Occasionally responds verbally Brisk startle response Intermittently FC MAE well Wounds c/d/i  IMPRESSION:  60 y.o. male SAH d#22, coiling Acom aneurysm, POD#1 VP shunt. Neurologically stable  PLAN: - Can transfer to stepdown - stable for transfer to CIR when insurance approves/bed available vs. SNF   46, MD [facility]

## 2021-06-09 NOTE — Progress Notes (Addendum)
ICU Transitions of Care Pharmacist Intervention Transitions of care were discussed with N/A and the following interventions were made.   Antipsychotic / Anticonvulsant was initiated during ICU admission and not intended to be continued at discharge.  No intervention was made.  Opiate(s) were initiated during ICU admission and not intended to be continued at discharge.  No intervention was made.  Benzodiazepine(s) were initiated during ICU admission and not intended to be continued at discharge.  No intervention was made.  Stress Ulcer Prophylaxis was initiated during ICU admission and not intended to be continued at discharge.d/c PPI per CCM  Antibiotic(s) were initiated during ICU admission and not intended to be continued at discharge.  No intervention was made.  Steroids were initiated during ICU admission and not intended to be continued at discharge.  No intervention was made.  [name] Stocking S. Merilynn Finland, PharmD, BCPS Clinical Staff Pharmacist [email or web address]

## 2021-06-09 NOTE — Progress Notes (Signed)
Physical Therapy Treatment Patient Details Name: [name] [name] MRN: [id number] DOB: 03-Aug-1961 Today's Date: 06/09/2021    History of Present Illness Pt is a 60 y.o. M who was admitted to ICU 5/25 with IVH with large acute SAH now s/p ventriculostomy. 5/26 coil embolization. 5/29 worsening neurological exam consistent with vasospasm. VP shunt placement 6/14. Significant PMH: rotator cuff tear, tobacco, marijuana and ETOH use, remote hx crack cocaine use.    PT Comments    Pt reassessed post VP shunt placement. Pt with improvement noted in command following, mobility, sitting balance and righting reactions. Pt following 25% of one step commands, nodding head appropriately to questions and verbalizing, "am I going to get better?" To MD. Pt requiring two person maximal assist for bed mobility and transfers to standing x 2 from edge of bed. Demonstrates increased right knee flexion with foot held in plantarflexed position (appears to have some sensitivity to both feet; asked mom to bring tennis shoes). Will continue to progress mobility as tolerated.     Follow Up Recommendations  CIR     Equipment Recommendations  Wheelchair (measurements PT);3in1 (PT);Wheelchair cushion (measurements PT);Hospital bed (hoyer lift)    Recommendations for Other Services       Precautions / Restrictions Precautions Precautions: Fall Precaution Comments: wrist restraints, mittens, bed posey Restrictions Weight Bearing Restrictions: No    Mobility  Bed Mobility Overal bed mobility: Needs Assistance Bed Mobility: Sidelying to Sit;Supine to Sit     Supine to sit: +2 for physical assistance;Max assist Sit to supine: +2 for physical assistance;Max assist   General bed mobility comments: initiation on trunk to come to sitting and for sit to supine; totalA for BLEs to come onto bed    Transfers Overall transfer level: Needs assistance Equipment used: 2 person hand held assist Transfers: Sit to/from  Stand Sit to Stand: Max assist;+2 physical assistance         General transfer comment: Initation noted, right leg kept in flexed position with right foot plantarflexed (appears to be sensitive to bilateral feet palpation, ROM WFL)  Ambulation/Gait                 Stairs             Wheelchair Mobility    Modified Rankin (Stroke Patients Only)       Balance Overall balance assessment: Needs assistance Sitting-balance support: No upper extremity supported;Feet supported;Bilateral upper extremity supported Sitting balance-Leahy Scale: Poor Sitting balance - Comments: Pt with posterior and right lateral lean, requiring minA. PT performing reaching, grooming and balance challenges at EOB                                    Cognition Arousal/Alertness: Awake/alert Behavior During Therapy: Flat affect;Restless;Impulsive Overall Cognitive Status: Impaired/Different from baseline Area of Impairment: Following commands;Attention;Awareness;Safety/judgement;Problem solving                   Current Attention Level: Sustained   Following Commands: Follows one step commands inconsistently;Follows one step commands with increased time Safety/Judgement: Decreased awareness of safety;Decreased awareness of deficits Awareness: Intellectual Problem Solving: Decreased initiation;Difficulty sequencing;Requires verbal cues General Comments: Pt increasing alertness in upright posture following ~25% of commands.  Pt asking MD in room "will I get better?" Pt nodding yes to "are you thirsty?" Pt asked to blink if you had fun today and pt shut both eyes for 1-2 secs appeared  to be deliberate. Pt squeezing with bilat hands and attempting to wash face, but unable to coordinate hands with holding washcloth. Pt attempting to give OT a thumbs up with R hand.      Exercises Other Exercises Other Exercises: reaching tasks in sitting    General Comments General  comments (skin integrity, edema, etc.): VSS on RA.      Pertinent Vitals/Pain Pain Assessment: Faces Faces Pain Scale: Hurts little more Pain Location: BLE feet Pain Descriptors / Indicators: Tender;Discomfort;Guarding Pain Intervention(s): Monitored during session;Repositioned    Home Living                      Prior Function            PT Goals (current goals can now be found in the care plan section) Acute Rehab PT Goals Patient Stated Goal: unable PT Goal Formulation: Patient unable to participate in goal setting Progress towards PT goals: Progressing toward goals    Frequency    Min 4X/week      PT Plan Current plan remains appropriate    Co-evaluation PT/OT/SLP Co-Evaluation/Treatment: Yes Reason for Co-Treatment: Complexity of the patient's impairments (multi-system involvement);Necessary to address cognition/behavior during functional activity;To address functional/ADL transfers;For patient/therapist safety PT goals addressed during session: Mobility/safety with mobility;Balance OT goals addressed during session: ADL's and self-care      AM-PAC PT "6 Clicks" Mobility   Outcome Measure  Help needed turning from your back to your side while in a flat bed without using bedrails?: A Lot Help needed moving from lying on your back to sitting on the side of a flat bed without using bedrails?: Total Help needed moving to and from a bed to a chair (including a wheelchair)?: Total Help needed standing up from a chair using your arms (e.g., wheelchair or bedside chair)?: Total Help needed to walk in hospital room?: Total Help needed climbing 3-5 steps with a railing? : Total 6 Click Score: 7    End of Session   Activity Tolerance: Patient tolerated treatment well Patient left: in bed;with bed alarm set;with call bell/phone within reach;with restraints reapplied Nurse Communication: Mobility status PT Visit Diagnosis: Unsteadiness on feet (R26.81);Muscle  weakness (generalized) (M62.81);Difficulty in walking, not elsewhere classified (R26.2)     Time: 8295-6213 PT Time Calculation (min) (ACUTE ONLY): 32 min  Charges:  $Therapeutic Activity: 8-22 mins                     Lillia Pauls, PT, DPT Acute Rehabilitation Services Pager [phone] Office [phone]    Norval Morton 06/09/2021, 11:56 AM

## 2021-06-09 NOTE — Final Consult Note (Signed)
Consultant Final Sign-Off Note    Assessment/Final recommendations  [name] [name] is a 60 y.o. male followed by me after laparoscopic assisted VP shunt insertion by Dr. Janee Morn - 06/08/21   Wound care (if applicable): Patient can shower - let soapy water run over the incisions and pat dry. Do not submerge in water for 2 weeks   Diet at discharge: per primary team   Activity at discharge:  Patient should not lift > 15 lbs for 2 weeks  and should not lift greater than 40lbs for 4 additional weeks.   Follow-up appointment:  PRN   Pending results:  Unresulted Labs (From admission, onward)     Start     Ordered   06/08/21 0733  Basic metabolic panel  Every Tue,Fri,   R     Question:  Specimen collection method  Answer:  Unit=Unit collect   06/07/21 0733   06/08/21 0733  CBC  Every Tue,Fri,   R     Question:  Specimen collection method  Answer:  Unit=Unit collect   06/07/21 1308             Medication recommendations: N/A   Other recommendations: N/A    Thank you for allowing [place] to participate in the care of your patient!  Please consult [place] again if you have further needs for your patient.  Elmer Sow [facility] 06/09/2021 10:47 AM    Subjective   No acute events noted overnight. When asked about abdominal pain he reports "no abdominal pain". Tolerating TF"s without emesis.   Objective  Vital signs in last 24 hours: Temp:  [96.4 F (35.8 C)-98.2 F (36.8 C)] 97.8 F (36.6 C) (06/15 0800) Pulse Rate:  [83-102] 96 (06/15 1000) Resp:  [13-25] 22 (06/15 1000) BP: (110-163)/(68-143) 121/90 (06/15 1000) SpO2:  [97 %-100 %] 100 % (06/15 1000) Weight:  [60 kg] 60 kg (06/15 0359)  General: Awake and alert, NAD Abd: Soft, ND, NT, Incisions with glue intact appears well and are without drainage, bleeding, or signs of infection   Pertinent labs and Studies: Recent Labs    06/07/21 0517 06/08/21 0620  WBC 8.5 11.1*  HGB 12.4* 12.9*  HCT 38.1* 39.3   BMET Recent  Labs    06/08/21 0620  NA 144  K 3.9  CL 110  CO2 28  GLUCOSE 117*  BUN 15  CREATININE 0.57*  CALCIUM 9.9   No results for input(s): LABURIN in the last 72 hours. Results for orders placed or performed during the hospital encounter of 05/19/21  Urine Culture     Status: Abnormal   Collection Time: 05/19/21  6:00 PM   Specimen: Urine, Random  Result Value Ref Range Status   Specimen Description URINE, RANDOM  Final   Special Requests   Final    NONE Performed at [facility], [address], [place], [name] [address]    Culture 40,000 COLONIES/mL STAPHYLOCOCCUS EPIDERMIDIS (A)  Final   Report Status 05/22/2021 FINAL  Final   Organism ID, Bacteria STAPHYLOCOCCUS EPIDERMIDIS (A)  Final      Susceptibility   Staphylococcus epidermidis - MIC*    CIPROFLOXACIN <=0.5 SENSITIVE Sensitive     GENTAMICIN <=0.5 SENSITIVE Sensitive     NITROFURANTOIN <=16 SENSITIVE Sensitive     OXACILLIN <=0.25 SENSITIVE Sensitive     TETRACYCLINE >=16 RESISTANT Resistant     VANCOMYCIN 2 SENSITIVE Sensitive     TRIMETH/SULFA <=10 SENSITIVE Sensitive     CLINDAMYCIN RESISTANT Resistant  RIFAMPIN <=0.5 SENSITIVE Sensitive     Inducible Clindamycin POSITIVE Resistant     * 40,000 COLONIES/mL STAPHYLOCOCCUS EPIDERMIDIS  Resp Panel by RT-PCR (Flu A&B, Covid)     Status: None   Collection Time: 05/19/21  6:18 PM   Specimen: Nasopharyngeal(NP) swabs in vial transport medium  Result Value Ref Range Status   SARS Coronavirus 2 by RT PCR NEGATIVE NEGATIVE Final    Comment: (NOTE) SARS-CoV-2 target nucleic acids are NOT DETECTED.  The SARS-CoV-2 RNA is generally detectable in upper respiratory specimens during the acute phase of infection. The lowest concentration of SARS-CoV-2 viral copies this assay can detect is 138 copies/mL. A negative result does not preclude SARS-Cov-2 infection and should not be used as the sole basis for treatment or other patient management decisions. A negative  result may occur with  improper specimen collection/handling, submission of specimen other than nasopharyngeal swab, presence of viral mutation(s) within the areas targeted by this assay, and inadequate number of viral copies(<138 copies/mL). A negative result must be combined with clinical observations, patient history, and epidemiological information. The expected result is Negative.  Fact Sheet for Patients:  BloggerCourse.com  Fact Sheet for Healthcare Providers:  SeriousBroker.it  This test is no t yet approved or cleared by the [place] FDA and  has been authorized for detection and/or diagnosis of SARS-CoV-2 by FDA under an Emergency Use Authorization (EUA). This EUA will remain  in effect (meaning this test can be used) for the duration of the COVID-19 declaration under Section 564(b)(1) of the Act, 21 U.S.C.section 360bbb-3(b)(1), unless the authorization is terminated  or revoked sooner.       Influenza A by PCR NEGATIVE NEGATIVE Final   Influenza B by PCR NEGATIVE NEGATIVE Final    Comment: (NOTE) The Xpert Xpress SARS-CoV-2/FLU/RSV plus assay is intended as an aid in the diagnosis of influenza from Nasopharyngeal swab specimens and should not be used as a sole basis for treatment. Nasal washings and aspirates are unacceptable for Xpert Xpress SARS-CoV-2/FLU/RSV testing.  Fact Sheet for Patients: BloggerCourse.com  Fact Sheet for Healthcare Providers: SeriousBroker.it  This test is not yet approved or cleared by the [place] FDA and has been authorized for detection and/or diagnosis of SARS-CoV-2 by FDA under an Emergency Use Authorization (EUA). This EUA will remain in effect (meaning this test can be used) for the duration of the COVID-19 declaration under Section 564(b)(1) of the Act, 21 U.S.C. section 360bbb-3(b)(1), unless the authorization is  terminated or revoked.  Performed at [facility], [address], [place], [name] [address]   MRSA PCR Screening     Status: None   Collection Time: 05/19/21  9:49 PM   Specimen: Nasal Mucosa; Nasopharyngeal  Result Value Ref Range Status   MRSA by PCR NEGATIVE NEGATIVE Final    Comment:        The GeneXpert MRSA Assay (FDA approved for NASAL specimens only), is one component of a comprehensive MRSA colonization surveillance program. It is not intended to diagnose MRSA infection nor to guide or monitor treatment for MRSA infections. Performed at [facility], [address] [place], [name] [address]   Surgical PCR screen     Status: None   Collection Time: 06/08/21 11:33 AM   Specimen: Nasal Mucosa; Nasal Swab  Result Value Ref Range Status   MRSA, PCR NEGATIVE NEGATIVE Final   Staphylococcus aureus NEGATIVE NEGATIVE Final    Comment: (NOTE) The Xpert SA Assay (FDA approved for  NASAL specimens in patients 50 years of age and older), is one component of a comprehensive surveillance program. It is not intended to diagnose infection nor to guide or monitor treatment. Performed at [facility], [address], [place], [name] [address]     Imaging: No results found.

## 2021-06-09 NOTE — Progress Notes (Signed)
  Speech Language Pathology Treatment: Cognitive-Linquistic  Patient Details Name: [name] [name] MRN: [id number] DOB: 1961/03/12 Today's Date: 06/09/2021 Time: 1324-4010 SLP Time Calculation (min) (ACUTE ONLY): 17 min  Assessment / Plan / Recommendation Clinical Impression  Pt who received shunt yesterday, was seen during a portion of PT/OT's session and again at a later time for communication and cognition. He sat on edge of bed focusing attention on his balance with difficulty dividing attention to therapists questions. Focused eye gaze when name called. He did not head appropriately to questions re: comfort/thirst approximately 60% of the time. MD updating pt who asked him in low intensity voice "Am I going to get better?" SLP returned 45 min later as he was in bed. Nodded during prior session that he was thirsty and therapist showed 4 pictures of drinks -did not initiate pointing to make a request when visual field decreased to 2 pictures.    HPI HPI: Pt is a 51 you w/ PMH sinusitis, rotator cuff tear, tobacco, marijuana, and ETOH use, remote hx of crack cocaine use who is being managed for intraventricular hemorrhage w/ large acute SAH now s/p ventriculostomy.  Pt was briefly intubated on 5/26 for procedure.      SLP Plan  Continue with current plan of care       Recommendations                   Oral Care Recommendations: Oral care QID Follow up Recommendations: Skilled Nursing facility SLP Visit Diagnosis: Cognitive communication deficit (U72.536) Plan: Continue with current plan of care       GO                Royce Macadamia 06/09/2021, 12:01 PM

## 2021-06-09 NOTE — Progress Notes (Signed)
Inpatient Rehab Admissions Coordinator:   Pt. Now s/p VP shunt, I will follow and see if he makes progress with therapies and pursue for CIR admit if he appears to be an appropriate candidate.   Megan Salon, MS, CCC-SLP Rehab Admissions Coordinator  [phone] (celll) [phone] (office)

## 2021-06-09 NOTE — Progress Notes (Signed)
NAME:  [name] [name], MRN:  [id number], DOB:  [birth year], LOS: 21 ADMISSION DATE:  05/19/2021, CONSULTATION DATE:  05/20/21 REFERRING MD:  [name] [name] CHIEF COMPLAINT:  SAH  Brief Narrative:  Goes by [name] [name]"  60 year old man presented to ED for frontal headache  CT Head on admission with SAH and CTA showed 86mm aneurysm from anterior communicating artery, thought to have ruptured and caused hemorrhage. Ventriculostomy done at bedside in ED.  S/p coiling on 5/26.   Significant Hospital Events:  5/25 Admitted with [facility] secondary to ruptured aneurysm, ventriculostomy preformed in ED, Ancef started 5/26 Coil Embolization 5/29 worsening neurological exam consistent with vasospasm, hemodynamic augmentation initiated. 6/1 worsening hyponatremia: Na of 120.  Patient started on salt tablets 1 g twice daily and fludrocortisone 0.1 mg. 6/2 BP goal reduced 160 -180.  Salt Tablet increased to 1 g tid. Fludrocortisone increased 0.2 mg. 6/8 Ongoing agitation, intermittently following commands. CT Head with worsening hydrocephalus despite EVD, open to 10 per NSGY. 6/9 EVD remains in place, repeat head CT as below  6/10-12, no significant neuro changes, intermittently agitated.  Ongoing EVD, plans for VP shunt next week 6/13 no neuro changes, remains intermittently restless 6/14 went for VP shunt, no neuro changes, afebrile  Interim History/Subjective:  S/p VP shunt placement yesterday No events overnight Afebrile Some loose stools s/p imodium overnight Keeps pulling either condom cath/ bag off- still gets occasionally agitated/ tachycardic when he needs to void Following intermittent simple commands this morning on exam  Objective   Blood pressure 126/86, pulse 94, temperature 98 F (36.7 C), temperature source Axillary, resp. rate 18, height 5\' 9"  (1.753 m), weight 60 kg, SpO2 100 %.        Intake/Output Summary (Last 24 hours) at 06/09/2021 0738 Last data filed at 06/09/2021 0700 Gross  per 24 hour  Intake 1430 ml  Output 75 ml  Net 1355 ml   Filed Weights   06/07/21 0500 06/08/21 0500 06/09/21 0359  Weight: 60.7 kg 60.5 kg 60 kg   Physical Examination: General:  chronically ill and thin older appearing male lying in bed in NAD HEENT: MM pink/moist, pupils 4/reactive, dressing to right frontal area dry Neuro: Awake, tracks, remains non-verbal, no attempts to vocalize, was able to intermittent f/c - wiggled right toes, moved LLE on command, and grips- bilaterally weak but moves all extremities to gravity CV: NSR, no murmur PULM:  non labored, on room air, clear and diminished  GI: soft, NT, +bs, condom cath, small incisions- wnl/ glued Extremities: warm/dry, no LE edema, previous partial amputation of left great toe, RUE TL PICC wnl Skin: no rashes   Labs/imaging that I have personally reviewed:  No labs today Glucose trend < 180  Resolved Hospital Problem list     Assessment & Plan:  Aneurysmal subarachnoid hemorrhage  -due to ruptured anterior communicating artery aneurysm status post coil embolization on 5/26 Symptomatic vasospasm post SAH Obstructive hydrocephalus s/p EVD (out 6/14) s/p VP shunt 6/14 Acute encephalopathy due to subarachnoid hemorrhage Dysphagia  P: Per NSGY S/p VP shunt 6/14 Neuro checks q 4, seems to be slowly improving but remains aphasic  Maintain neuro protective measures; goal for eurothermia, euglycemia, eunatermia, normoxia, and PCO2 goal of 35-40 Nutrition and bowel regiment - per cortrak, SLP following, remains on dysphagia 1 diet  Seizure precautions  Aspirations precautions   Discussed with Dr. 7/14, will tx to PCU today. Will ask TRH to assume medical needs, PCCM will be available as needed.  Hyponatremia- suspected cerebral salt wasting Hypokalemia P: - trend renal indices- tues and fridays - trend I/Os, inaccurate thus far- pt keeps pulling off urinary bag/ condom cath - continue salt tabs, decreased from 2->  1gm TID 6/14; florinef stopped 6/13; recheck Na Friday, if still within normal, stop salt tabs   Diarrhea - intermittent P: Continue prn loperamide  Continue fiber   Physical deconditioning in the setting of SAH P: Continue PT/OT Likely CIR   At risk malnutrition  P: TF per cortrak SLP following for advancing diet as able  Leukocytosis 6/14 P:  Remains afebrile Monitor clinically   Best practice   Diet:  per SLP, TF per cortrak/ dysphagia 1 Pain/Anxiety/Delirium protocol (if indicated): No VAP protocol (if indicated): Not indicated DVT prophylaxis: SCD, heparin SQ GI prophylaxis: PPI Glucose control:  SSI No Central venous access:  R PICC- will d/c today  Arterial line:  N/A Foley:  N/A Mobility:  As tolerated, PT/ OT PT consulted:  Yes Last date of multidisciplinary goals of care discussion  per primary, no family at bedside 6/14 or 6/15 Code Status:  full code Disposition: ICU - per NSGY,Transfer to PCU today   Critical care time: NA     [name] [name], ACNP [facility] 06/09/2021, 7:38 AM

## 2021-06-09 NOTE — Op Note (Signed)
   NEUROSURGERY OPERATIVE NOTE   PREOP DIAGNOSIS: Non-obstructive hydrocephalus  POSTOP DIAGNOSIS: Same  PROCEDURE: 1. Laparoscopic Assisted ventriculoperitoneal shunt placement  SURGEON: Dr. Lisbeth Renshaw, MD  CO-SURGEON: Dr. Violeta Gelinas  ANESTHESIA: General Endotracheal  EBL: Minimal  SPECIMENS: None  DRAINS: None  COMPLICATIONS: None immediate  CONDITION: Stable to ICU  SHUNT PLACED: Medtronic Strata II, set to 1.5  HISTORY: YUVRAJ PFEIFER is a 60 y.o. male admitted to the hospital with subarachnoid hemorrhage.  Patient initially required external ventricular drainage for communicating hydrocephalus.  Attempt was made to wean his ventriculostomy drain unsuccessfully.  CT scan demonstrated progressive ventriculomegaly with clipping of his ventricular drain.  He was therefore deemed to be shunt dependent.  He presents today for laparoscopic assisted ventriculoperitoneal shunt placement.  The risks, benefits, alternatives, and indications for surgery were all reviewed in detail with the patient's mother.  After all questions were answered informed consent was obtained and witnessed.  PROCEDURE IN DETAIL: The patient was brought to the operating room and transferred to the operative table. After induction of general anesthesia, the patient was positioned on the operative table in the supine position with all pressure points meticulously padded. The skin of the scalp,  neck, chest, and abdomen were prepped in the usual sterile fashion.  The previously made right frontal scalp incision was opened sharply and extended in a semilunar fashion, and the external ventricular drain was identified.  A tunnel was then created using a hemostat subcutaneously to the retroauricular region.  Counter incision was made behind the ear, and the distal shunt apparatus was then passed from a distal to proximal fashion, and the valve was seated subcutaneously.  The shunt passer was then used to  tunnel from the retroauricular incision into the right upper quadrant.   The previously placed external ventricular drain was then clamped and removed, and using standard anatomic landmarks a 6 cm ventricular catheter was then placed in the previously placed bur hole.  Good clear CSF flow was obtained. The catheter was then connected to the valve apparatus.  The valve was then pumped, and good distal flow was observed.  The distal portion of the shunt was then placed in the intraperitoneal space under laparoscopic guidance by Dr. Janee Morn.  The details of this procedure are dictated in a separate report. We were able to directly visualize good flow of CSF through the distal end of the catheter within the peritoneal space.  At this point the scalp wounds were irrigated with copious amounts of bacitracin irrigation, and closed using 3-0 Vicryl stitches.  The skin was then closed using standard surgical skin staples.  Sterile dressings were then applied.  At the end of the case all sponge needle and instrument counts were correct.  The distal portion of the external ventricular drain was then removed and the tunnelled exit site was closed using skin staples.  The patient tolerated the procedure well and was extubated in the room and taken to the postanesthesia care unit in stable condition.  Lisbeth Renshaw, MD [facility]

## 2021-06-10 DIAGNOSIS — Z4659 Encounter for fitting and adjustment of other gastrointestinal appliance and device: Secondary | ICD-10-CM

## 2021-06-10 DIAGNOSIS — I609 Nontraumatic subarachnoid hemorrhage, unspecified: Secondary | ICD-10-CM | POA: Diagnosis not present

## 2021-06-10 DIAGNOSIS — I608 Other nontraumatic subarachnoid hemorrhage: Secondary | ICD-10-CM | POA: Diagnosis not present

## 2021-06-10 DIAGNOSIS — I67848 Other cerebrovascular vasospasm and vasoconstriction: Secondary | ICD-10-CM | POA: Diagnosis not present

## 2021-06-10 LAB — GLUCOSE, CAPILLARY
Glucose-Capillary: 129 mg/dL — ABNORMAL HIGH (ref 70–99)
Glucose-Capillary: 119 mg/dL — ABNORMAL HIGH (ref 70–99)
Glucose-Capillary: 161 mg/dL — ABNORMAL HIGH (ref 70–99)
Glucose-Capillary: 125 mg/dL — ABNORMAL HIGH (ref 70–99)
Glucose-Capillary: 112 mg/dL — ABNORMAL HIGH (ref 70–99)
Glucose-Capillary: 105 mg/dL — ABNORMAL HIGH (ref 70–99)

## 2021-06-10 MED ORDER — JEVITY 1.5 CAL/FIBER PO LIQD
980.0000 mL | ORAL | Status: DC
  Administered 2021-06-11 – 2021-06-12 (×2): 980 mL
  Filled 2021-06-10 (×4): qty 1000

## 2021-06-10 MED ORDER — ENSURE ENLIVE PO LIQD
237.0000 mL | Freq: Two times a day (BID) | ORAL | Status: DC
  Administered 2021-06-17 (×2): 237 mL via ORAL

## 2021-06-10 NOTE — Progress Notes (Signed)
[facility] PROGRESS NOTE    Progress Note  [name] [name]  NGE:[id number] DOB: 1961/04/26 DOA: 05/19/2021 PCP: Pcp, No     Brief Narrative:   [name] [name] is an 60 y.o. male past medical history past medical history of alcohol abuse and drug abuse who suffered a subarachnoid hemorrhage status post coiling of the anterior communicating aneurysm, underwent VP shunting on 06/09/2019  Significant Events: 5/25 Admitted with [facility] secondary to ruptured aneurysm, ventriculostomy preformed in ED, Ancef started 5/26 Coil Embolization 5/29 worsening neurological exam consistent with vasospasm, hemodynamic augmentation initiated. 6/1 worsening hyponatremia: Na of 120.  Patient started on salt tablets 1 g twice daily and fludrocortisone 0.1 mg. 6/2 BP goal reduced 160 -180.  Salt Tablet increased to 1 g tid. Fludrocortisone increased 0.2 mg. 6/8 Ongoing agitation, intermittently following commands. CT Head with worsening hydrocephalus despite EVD, open to 10 per NSGY. 6/9 EVD remains in place, repeat head CT as below 6/10-12, no significant neuro changes, intermittently agitated.  Ongoing EVD, plans for VP shunt next week 6/13 no neuro changes, remains intermittently restless 6/14 went for VP shunt, no neuro changes, afebrile  Assessment/Plan:   Acute metabolic encephalopathy subarachnoid hemorrhage secondary to aneurysm rupture: With obstructive hydrocephalus status post EVD on 06/08/2021.Marland Kitchen Core track in place continue nutrition per NG tube remains in the dysphagia 1 diet. Continue seizure precautions and aspiration precautions. He is only responsive to painful stimuli and verbal but not able to answer yes or no questions.  Hyponatremia suspect cerebral salt wasting syndrome: On sodium tablets and Florinef now 144 check again tomorrow if normal can DC sodium tablets.  Diarrhea: Continue loperamide.  Physical deconditioning in the setting of subarachnoid hemorrhage: PT and OT was  consulted CIR. Awaiting inpatient rehab evaluation.  Nutrition: Continue nutrition per core track.  Leukocytosis: Likely stress margination has remained afebrile, continue to hold antibiotics.   Cerebral salt-wasting   Aneurysmal subarachnoid hemorrhage (HCC)   Vasospasm of cerebral artery   Diarrheal stools   DVT prophylaxis: scd Family Communication:none Status is: Inpatient  Remains inpatient appropriate because:Hemodynamically unstable  Dispo: The patient is from: Home              Anticipated d/c is to: CIR              Patient currently is not medically stable to d/c.   Difficult to place patient No    Code Status:     Code Status Orders  (From admission, onward)           Start     Ordered   05/19/21 2022  Full code  Continuous        05/19/21 2022           Code Status History     Date Active Date Inactive Code Status Order ID Comments User Context   10/31/2016 2152 11/01/2016 1945 Full Code [id number]  [name] [name] Inpatient   10/31/2016 1308 10/31/2016 2047 Full Code [id number]  [name] [name] ED   06/13/2013 1417 06/13/2013 2022 Full Code [id number]  [name] Beck, PA-C ED         IV Access:   Peripheral IV   Procedures and diagnostic studies:   No results found.   Medical Consultants:   None.   Subjective:    [name] [name] nonverbal  Objective:    Vitals:   06/10/21 0700 06/10/21 0800 06/10/21 0900 06/10/21 1000  BP: 127/85 118/85 113/80 115/82  Pulse: 91 88  88 88  Resp: 20 15 15 15   Temp:  97.8 F (36.6 C)    TempSrc:  Oral    SpO2: 99% 97% 98% 98%  Weight:      Height:       SpO2: 98 % O2 Flow Rate (L/min): 2 L/min   Intake/Output Summary (Last 24 hours) at 06/10/2021 1041 Last data filed at 06/10/2021 1000 Gross per 24 hour  Intake 1760 ml  Output 1425 ml  Net 335 ml   Filed Weights   06/07/21 0500 06/08/21 0500 06/09/21 0359  Weight: 60.7 kg 60.5 kg 60 kg    Exam: General  exam: In no acute distress. Respiratory system: Good air movement and clear to auscultation. Cardiovascular system: S1 & S2 heard, RRR. No JVD. Gastrointestinal system: Abdomen is nondistended, soft and nontender.  Extremities: No pedal edema. Skin: No rashes, lesions or ulcers    Data Reviewed:    Labs: Basic Metabolic Panel: Recent Labs  Lab 06/04/21 0823 06/05/21 0731 06/08/21 0620  NA 135 137 144  K 4.1 4.2 3.9  CL 100 100 110  CO2 27 30 28   GLUCOSE 126* 146* 117*  BUN 11 16 15   CREATININE 0.47* 0.56* 0.57*  CALCIUM 9.4 9.5 9.9   GFR Estimated Creatinine Clearance: 84.4 mL/min (A) (by C-G formula based on SCr of 0.57 mg/dL (L)). Liver Function Tests: No results for input(s): AST, ALT, ALKPHOS, BILITOT, PROT, ALBUMIN in the last 168 hours. No results for input(s): LIPASE, AMYLASE in the last 168 hours. No results for input(s): AMMONIA in the last 168 hours. Coagulation profile No results for input(s): INR, PROTIME in the last 168 hours. COVID-19 Labs  No results for input(s): DDIMER, FERRITIN, LDH, CRP in the last 72 hours.  Lab Results  Component Value Date   SARSCOV2NAA NEGATIVE 05/19/2021   SARSCOV2NAA NEGATIVE 05/10/2021   SARSCOV2NAA NEGATIVE 05/05/2021   SARSCOV2NAA NEGATIVE 10/24/2020    CBC: Recent Labs  Lab 06/04/21 0400 06/05/21 0731 06/06/21 0559 06/07/21 0517 06/08/21 0620  WBC 6.9 7.5 8.0 8.5 11.1*  NEUTROABS 4.7 5.2 5.7 6.1  --   HGB 11.6* 12.8* 12.7* 12.4* 12.9*  HCT 34.3* 38.6* 38.2* 38.1* 39.3  MCV 98.8 97.7 100.5* 101.6* 100.5*  PLT 604* 648* 625* 634* 473*   Cardiac Enzymes: No results for input(s): CKTOTAL, CKMB, CKMBINDEX, TROPONINI in the last 168 hours. BNP (last 3 results) No results for input(s): PROBNP in the last 8760 hours. CBG: Recent Labs  Lab 06/09/21 1514 06/09/21 1947 06/09/21 2322 06/10/21 0328 06/10/21 0757  GLUCAP 120* 138* 112* 129* 119*   D-Dimer: No results for input(s): DDIMER in the last 72  hours. Hgb A1c: No results for input(s): HGBA1C in the last 72 hours. Lipid Profile: No results for input(s): CHOL, HDL, LDLCALC, TRIG, CHOLHDL, LDLDIRECT in the last 72 hours. Thyroid function studies: No results for input(s): TSH, T4TOTAL, T3FREE, THYROIDAB in the last 72 hours.  Invalid input(s): FREET3 Anemia work up: No results for input(s): VITAMINB12, FOLATE, FERRITIN, TIBC, IRON, RETICCTPCT in the last 72 hours. Sepsis Labs: Recent Labs  Lab 06/05/21 0731 06/06/21 0559 06/07/21 0517 06/08/21 0620  WBC 7.5 8.0 8.5 11.1*   Microbiology Recent Results (from the past 240 hour(s))  Surgical PCR screen     Status: None   Collection Time: 06/08/21 11:33 AM   Specimen: Nasal Mucosa; Nasal Swab  Result Value Ref Range Status   MRSA, PCR NEGATIVE NEGATIVE Final   Staphylococcus aureus NEGATIVE NEGATIVE Final  Comment: (NOTE) The Xpert SA Assay (FDA approved for NASAL specimens in patients 39 years of age and older), is one component of a comprehensive surveillance program. It is not intended to diagnose infection nor to guide or monitor treatment. Performed at [facility], [address] [place], [name] [address]      Medications:    chlorhexidine  15 mL Mouth Rinse BID   Chlorhexidine Gluconate Cloth  6 each Topical Daily   feeding supplement (PROSource TF)  45 mL Per Tube BID   fiber  1 packet Per Tube BID   heparin injection (subcutaneous)  5,000 Units Subcutaneous Q8H   mouth rinse  15 mL Mouth Rinse q12n4p   sodium chloride  1 g Per Tube BID WC   Continuous Infusions:  feeding supplement (JEVITY 1.2 CAL) 1,000 mL (06/10/21 0924)      LOS: 22 days   Marinda Elk  [facility]  06/10/2021, 10:41 AM

## 2021-06-10 NOTE — Progress Notes (Signed)
Nutrition Follow-up  DOCUMENTATION CODES:   Not applicable  INTERVENTION:   If po intake inadequate, recommend resuming continuous feedings to meet 100% patients estimated needs  Ensure Enlive po BID, each supplement provides 350 kcal and 20 grams of protein  Magic cup TID with meals, each supplement provides 290 kcal and 9 grams of protein  Feeding assistance and encouragement at all meal times  Tube Feeding: Change to 14 hour Nocturnal TF  Jevity 1.5 at 70 ml/hr x 14 hours Continue Pro-Source TF 45 mL BID Provides 1550 kcals, 92 g of protein, 745 mL of free water   NUTRITION DIAGNOSIS:   Inadequate oral intake related to inability to eat as evidenced by NPO status.  Being addressed via TF, diet, supplements  GOAL:   Patient will meet greater than or equal to 90% of their needs  Being addressed  MONITOR:   Diet advancement, Labs, Weight trends, TF tolerance, Skin, I & O's  REASON FOR ASSESSMENT:   Other (Comment)    ASSESSMENT:   Pt is a 60 you w/ PMH sinusitis, rotator cuff tear, tobacco, marijuana, and ETOH use, remote hx of crack cocaine use who is being managed for intraventricular hemorrhage w/ large acute SAH now s/p ventriculostomy.  Diet advanced back to Dysphagia 1, Thins today. Only 1 meal documented previously, pt ate 0% at this meal.   On visit, pt opens eyes but does nothing else. Pt appears sleepy.  Pt has safety mittens and is in restraints. Wife at bedside.   Tolerating Jevity 1.5 at 65 ml/hr via Cortrak  Current wt 60 kg  Discussed in ICU rounds and request made for pt to trial pt on nocturnal TF to see if this improves appetite. RD will to try trial but unsure if this will help appetite given pt's mental status.   Labs: reviewed Meds: sodium chloride tabs, nutrisource fiber   Diet Order:   Diet Order             DIET - DYS 1 Room service appropriate? Yes; Fluid consistency: Thin  Diet effective now                    EDUCATION NEEDS:   Education needs have been addressed  Skin:  Skin Assessment: Skin Integrity Issues: Skin Integrity Issues:: Other (Comment) Incisions: closed rt groin Other: MASD scrotum  Last BM:  6/13 rectal tube out  Height:   Ht Readings from Last 1 Encounters:  05/20/21 5\' 9"  (1.753 m)    Weight:   Wt Readings from Last 1 Encounters:  06/09/21 60 kg    Ideal Body Weight:  72.7 kg  BMI:  Body mass index is 19.53 kg/m.  Estimated Nutritional Needs:   Kcal:  1800-2100 kcals  Protein:  90-110 g  Fluid:  >/= 1.8 L   06/11/21 MS, RDN, LDN, CNSC Registered Dietitian III Clinical Nutrition RD Pager and On-Call Pager Number Located in [place]

## 2021-06-10 NOTE — Progress Notes (Signed)
Physical Therapy Treatment Patient Details Name: [name] [name] MRN: [id number] DOB: 07-03-1961 Today's Date: 06/10/2021    History of Present Illness Pt is a 60 y.o. M who was admitted to ICU 5/25 with IVH with large acute SAH now s/p ventriculostomy. 5/26 coil embolization. 5/29 worsening neurological exam consistent with vasospasm. VP shunt placement 6/14. Significant PMH: rotator cuff tear, tobacco, marijuana and ETOH use, remote hx crack cocaine use.    PT Comments    Pt making significant progress with mobility this date. Pt was able to perform all bed mobility with mod-maxAx1 and sit <> stand transfers with modAx2. Pt was also able to begin gait training this date, taking several steps towards the [facility] with maxAx2 and bil UE support. Pt displays deficits in obtaining and maintaining bil knee extension, indicating quads weakness. In addition, he has poor midline orientation as he has a tendency to lean towards the R and avoid weight shifting to the L, limiting his ability to lift and advance his R leg. Noted decreased feet sensitivity and resulting plantarflexion when trying to stand when his tennis shoes are donned. Will continue to follow acutely. Pt would greatly benefit from intensive therapy in the CIR setting as he is demonstrating steady progress and motivation to participate and improve.      Follow Up Recommendations  CIR     Equipment Recommendations  Wheelchair (measurements PT);3in1 (PT);Wheelchair cushion (measurements PT);Hospital bed (hoyer lift)    Recommendations for Other Services       Precautions / Restrictions Precautions Precautions: Fall Precaution Comments: wrist restraints, mittens, bed posey, tennis shoes to decrease sensitivity with standing Restrictions Weight Bearing Restrictions: No    Mobility  Bed Mobility Overal bed mobility: Needs Assistance Bed Mobility: Supine to Sit;Sit to Supine     Supine to sit: Max assist;Mod assist;+2 for  safety/equipment Sit to supine: Max assist   General bed mobility comments: When cued to "come here" pt initiated movement of legs towards R EOB, needing modA to complete. Once trunk began to ascend he began to push posteriorly, needing maxA to prevent and complete trunk ascension. MaxA to manage legs and control trunk with return to supine, with pt following cues to lean laterally onto R elbow to control.    Transfers Overall transfer level: Needs assistance Equipment used: 2 person hand held assist Transfers: Sit to/from Stand;Lateral/Scoot Transfers Sit to Stand: +2 physical assistance;Mod assist;+2 safety/equipment        Lateral/Scoot Transfers: Max assist;+2 safety/equipment;+2 physical assistance General transfer comment: Pt with improved initiation in legs with transfer to stand with bil HHA. ModAx2 to steady pt and facilitate bil hip and knee extension while maintaining feet on floor as pt tends to lift his R foot off the ground when he is sitting, x2 reps sit <> stand. Lateral scoot to R on EOB maxAx2. Shoes donned during standing to decrease feet sensativity and resulting plantarflexion.  Ambulation/Gait Ambulation/Gait assistance: Max assist;+2 physical assistance;+2 safety/equipment Gait Distance (Feet): 2 Feet Assistive device: 2 person hand held assist Gait Pattern/deviations: Decreased step length - right;Decreased step length - left;Decreased stride length;Decreased weight shift to left;Decreased dorsiflexion - right;Shuffle;Trunk flexed Gait velocity: reduced Gait velocity interpretation: <1.31 ft/sec, indicative of household ambulator General Gait Details: Initiated gait training with lateral weight shifting, providing verbal and tactile cues to facilitate, with L knee block and intermittent R quads manual assistance. Progressed to marching in place and then to several side steps towards the R at EOB, all during same standing  bout. Pt needing bil UE support and maxAx2 to  steady and facilitate weight shifting, particularly to the L leg, and facilitate leg advancement, particularly the R leg as pt initiates movement of the L. Shoes donned during standing to decrease feet sensativity and resulting plantarflexion.   Stairs             Wheelchair Mobility    Modified Rankin (Stroke Patients Only) Modified Rankin (Stroke Patients Only) Pre-Morbid Rankin Score: No symptoms Modified Rankin: Moderately severe disability     Balance Overall balance assessment: Needs assistance Sitting-balance support: No upper extremity supported;Feet supported;Bilateral upper extremity supported Sitting balance-Leahy Scale: Poor Sitting balance - Comments: Pt with posterior and right lateral lean, requiring min guard for brief periods but minA-modA majority of time to maintain balance, cuing pt to place bil elbows towards knees to facilitate anterior and midline alignment. Difficulty with pt maintaining L elbow on thigh. Postural control: Posterior lean;Right lateral lean Standing balance support: Bilateral upper extremity supported Standing balance-Leahy Scale: Poor Standing balance comment: Reliant on bil UE support and external assistance to maintain balance.                            Cognition Arousal/Alertness: Awake/alert Behavior During Therapy: Flat affect;Impulsive Overall Cognitive Status: Impaired/Different from baseline Area of Impairment: Following commands;Attention;Awareness;Safety/judgement;Problem solving;Memory                   Current Attention Level: Selective Memory: Decreased short-term memory Following Commands: Follows one step commands inconsistently;Follows one step commands with increased time Safety/Judgement: Decreased awareness of safety;Decreased awareness of deficits Awareness: Intellectual Problem Solving: Decreased initiation;Difficulty sequencing;Requires verbal cues;Slow processing;Requires tactile  cues General Comments: Pt stating "hi" upon PT arrival and saying several sentences, inclusing "I have already been trying" in regards to therapy and standing. Pt quickly losing attention, needing his name called to regain it often. Follows up to ~35% of simple single-step multi-modal cues with increased time. Pt initiates tasks once PT found a phrase that caused an automatic response, like instead of "sit here" pointing to EOB pt began to initiate when cued to "come here".      Exercises      General Comments General comments (skin integrity, edema, etc.): Shoes donned during all standing.      Pertinent Vitals/Pain Pain Assessment: Faces Faces Pain Scale: Hurts little more Pain Location: bil feet Pain Descriptors / Indicators: Tender;Discomfort;Guarding Pain Intervention(s): Limited activity within patient's tolerance;Monitored during session;Repositioned;Other (comment) (donned tennis shoes)    Home Living                      Prior Function            PT Goals (current goals can now be found in the care plan section) Acute Rehab PT Goals Patient Stated Goal: agreeable to session, mother reports desire to ambulate PT Goal Formulation: With patient/family Time For Goal Achievement: 06/12/21 Potential to Achieve Goals: Good Progress towards PT goals: Progressing toward goals    Frequency    Min 4X/week      PT Plan Current plan remains appropriate    Co-evaluation              AM-PAC PT "6 Clicks" Mobility   Outcome Measure  Help needed turning from your back to your side while in a flat bed without using bedrails?: A Lot Help needed moving from lying on your back to sitting  on the side of a flat bed without using bedrails?: A Lot Help needed moving to and from a bed to a chair (including a wheelchair)?: Total Help needed standing up from a chair using your arms (e.g., wheelchair or bedside chair)?: A Lot Help needed to walk in hospital room?:  Total Help needed climbing 3-5 steps with a railing? : Total 6 Click Score: 9    End of Session Equipment Utilized During Treatment: Gait belt Activity Tolerance: Patient tolerated treatment well Patient left: in bed;with bed alarm set;with call bell/phone within reach;with restraints reapplied Nurse Communication: Mobility status PT Visit Diagnosis: Unsteadiness on feet (R26.81);Muscle weakness (generalized) (M62.81);Difficulty in walking, not elsewhere classified (R26.2);Other abnormalities of gait and mobility (R26.89)     Time: 8341-9622 PT Time Calculation (min) (ACUTE ONLY): 30 min  Charges:  $Gait Training: 8-22 mins $Therapeutic Activity: 8-22 mins                     Raymond Gurney, PT, DPT Acute Rehabilitation Services  Pager: [phone] Office: [phone]    Jewel Baize 06/10/2021, 3:46 PM

## 2021-06-10 NOTE — Progress Notes (Signed)
  NEUROSURGERY PROGRESS NOTE   No issues overnight.   EXAM:  BP 118/85 (BP Location: Left Arm)   Pulse 88   Temp 97.8 F (36.6 C) (Oral)   Resp 15   Ht 5\' 9"  (1.753 m)   Wt 60 kg   SpO2 97%   BMI 19.53 kg/m   Awake, alert Occasionally says name CN grossly intact  Intermittently FC MAE well Wounds c/d/i  IMPRESSION:  60 y.o. male SAH d#23 s/p Acom coiling, POD#2 s/p right VPS. Remains stable  PLAN: - Transfer to stepdown. - Await placement, CIR v SNF   46, MD [facility]

## 2021-06-10 NOTE — Anesthesia Postprocedure Evaluation (Signed)
Anesthesia Post Note  Patient: [name] [name]  Procedure(s) Performed: SHUNT INSERTION VENTRICULAR-PERITONEAL (Head) LAPAROSCOPIC ASSISTED VENTRICULAR-PERITONEAL (V-P) SHUNT (Right: Abdomen)     Patient location during evaluation: PACU Anesthesia Type: General Level of consciousness: awake and alert Pain management: pain level controlled Vital Signs Assessment: post-procedure vital signs reviewed and stable Respiratory status: spontaneous breathing, nonlabored ventilation and respiratory function stable Cardiovascular status: blood pressure returned to baseline and stable Postop Assessment: no apparent nausea or vomiting Anesthetic complications: no   No notable events documented.  Last Vitals:  Vitals:   06/10/21 2100 06/10/21 2200  BP: (!) 121/93 111/82  Pulse: 97 98  Resp: (!) 21 20  Temp:    SpO2: 98% 97%    Last Pain:  Vitals:   06/10/21 2000  TempSrc: Oral  PainSc:                  Cecile Hearing

## 2021-06-11 DIAGNOSIS — R4182 Altered mental status, unspecified: Secondary | ICD-10-CM | POA: Diagnosis not present

## 2021-06-11 DIAGNOSIS — I609 Nontraumatic subarachnoid hemorrhage, unspecified: Secondary | ICD-10-CM | POA: Diagnosis not present

## 2021-06-11 LAB — CBC
HCT: 39.1 % (ref 39.0–52.0)
Hemoglobin: 12.5 g/dL — ABNORMAL LOW (ref 13.0–17.0)
MCH: 32.4 pg (ref 26.0–34.0)
MCHC: 32 g/dL (ref 30.0–36.0)
MCV: 101.3 fL — ABNORMAL HIGH (ref 80.0–100.0)
Platelets: 411 10*3/uL — ABNORMAL HIGH (ref 150–400)
RBC: 3.86 MIL/uL — ABNORMAL LOW (ref 4.22–5.81)
RDW: 12.7 % (ref 11.5–15.5)
WBC: 6.3 10*3/uL (ref 4.0–10.5)
nRBC: 0 % (ref 0.0–0.2)

## 2021-06-11 LAB — GLUCOSE, CAPILLARY
Glucose-Capillary: 109 mg/dL — ABNORMAL HIGH (ref 70–99)
Glucose-Capillary: 119 mg/dL — ABNORMAL HIGH (ref 70–99)
Glucose-Capillary: 124 mg/dL — ABNORMAL HIGH (ref 70–99)
Glucose-Capillary: 116 mg/dL — ABNORMAL HIGH (ref 70–99)
Glucose-Capillary: 105 mg/dL — ABNORMAL HIGH (ref 70–99)
Glucose-Capillary: 148 mg/dL — ABNORMAL HIGH (ref 70–99)
Glucose-Capillary: 119 mg/dL — ABNORMAL HIGH (ref 70–99)

## 2021-06-11 LAB — BASIC METABOLIC PANEL
Anion gap: 9 (ref 5–15)
BUN: 15 mg/dL (ref 6–20)
CO2: 28 mmol/L (ref 22–32)
Calcium: 9.8 mg/dL (ref 8.9–10.3)
Chloride: 108 mmol/L (ref 98–111)
Creatinine, Ser: 0.64 mg/dL (ref 0.61–1.24)
GFR, Estimated: >60 mL/min (ref >60)
Glucose, Bld: 116 mg/dL — ABNORMAL HIGH (ref 70–99)
Potassium: 3.9 mmol/L (ref 3.5–5.1)
Sodium: 145 mmol/L (ref 135–145)

## 2021-06-11 NOTE — Plan of Care (Signed)
  Problem: Education: Goal: Knowledge of General Education information will improve Description: Including pain rating scale, medication(s)/side effects and non-pharmacologic comfort measures 06/11/2021 1806 by Merrilee Seashore, RN Outcome: Not Progressing 06/11/2021 1805 by Merrilee Seashore, RN Outcome: Not Progressing   Problem: Health Behavior/Discharge Planning: Goal: Ability to manage health-related needs will improve 06/11/2021 1806 by Merrilee Seashore, RN Outcome: Not Progressing 06/11/2021 1805 by Merrilee Seashore, RN Outcome: Not Progressing   Problem: Clinical Measurements: Goal: Ability to maintain clinical measurements within normal limits will improve 06/11/2021 1806 by Merrilee Seashore, RN Outcome: Progressing 06/11/2021 1805 by Merrilee Seashore, RN Outcome: Not Progressing Goal: Will remain free from infection 06/11/2021 1806 by Merrilee Seashore, RN Outcome: Progressing 06/11/2021 1805 by Merrilee Seashore, RN Outcome: Not Progressing Goal: Diagnostic test results will improve 06/11/2021 1806 by Merrilee Seashore, RN Outcome: Not Progressing 06/11/2021 1805 by Merrilee Seashore, RN Outcome: Not Progressing Goal: Respiratory complications will improve 06/11/2021 1806 by Merrilee Seashore, RN Outcome: Progressing 06/11/2021 1805 by Merrilee Seashore, RN Outcome: Not Progressing Goal: Cardiovascular complication will be avoided 06/11/2021 1806 by Merrilee Seashore, RN Outcome: Progressing 06/11/2021 1805 by Merrilee Seashore, RN Outcome: Not Progressing   Problem: Activity: Goal: Risk for activity intolerance will decrease Outcome: Not Progressing   Problem: Nutrition: Goal: Adequate nutrition will be maintained Outcome: Not Progressing   Problem: Coping: Goal: Level of anxiety will decrease Outcome: Not Progressing   Problem: Elimination: Goal: Will not experience complications related to bowel motility 06/11/2021 1806 by Merrilee Seashore, RN Outcome:  Progressing 06/11/2021 1805 by Merrilee Seashore, RN Outcome: Not Progressing Goal: Will not experience complications related to urinary retention 06/11/2021 1806 by Merrilee Seashore, RN Outcome: Progressing 06/11/2021 1805 by Merrilee Seashore, RN Outcome: Not Progressing   Problem: Pain Managment: Goal: General experience of comfort will improve 06/11/2021 1806 by Merrilee Seashore, RN Outcome: Progressing 06/11/2021 1805 by Merrilee Seashore, RN Outcome: Not Progressing   Problem: Safety: Goal: Ability to remain free from injury will improve 06/11/2021 1806 by Merrilee Seashore, RN Outcome: Progressing 06/11/2021 1805 by Merrilee Seashore, RN Outcome: Not Progressing   Problem: Skin Integrity: Goal: Risk for impaired skin integrity will decrease 06/11/2021 1806 by Merrilee Seashore, RN Outcome: Progressing 06/11/2021 1805 by Merrilee Seashore, RN Outcome: Not Progressing   Problem: Education: Goal: Knowledge of disease or condition will improve 06/11/2021 1806 by Merrilee Seashore, RN Outcome: Not Progressing 06/11/2021 1805 by Merrilee Seashore, RN Outcome: Not Progressing Goal: Knowledge of secondary prevention will improve 06/11/2021 1806 by Merrilee Seashore, RN Outcome: Not Progressing 06/11/2021 1805 by Merrilee Seashore, RN Outcome: Not Progressing Goal: Knowledge of patient specific risk factors addressed and post discharge goals established will improve 06/11/2021 1806 by Merrilee Seashore, RN Outcome: Not Progressing 06/11/2021 1805 by Merrilee Seashore, RN Outcome: Not Progressing Goal: Individualized Educational Video(s) 06/11/2021 1806 by Merrilee Seashore, RN Outcome: Not Progressing 06/11/2021 1805 by Merrilee Seashore, RN Outcome: Not Progressing   Problem: Safety: Goal: Non-violent Restraint(s) Outcome: Not Progressing

## 2021-06-11 NOTE — Progress Notes (Addendum)
Occupational Therapy Treatment Patient Details Name: [name] [name] MRN: [id number] DOB: 1961-01-06 Today's Date: 06/11/2021    History of present illness 60 y.o. M who was admitted to ICU 05/19/21 with IVH with large acute SAH now s/p ventriculostomy. 5/26 coil embolization. 5/29 worsening neurological exam consistent with vasospasm. VP shunt placement 6/14. Significant PMH: rotator cuff tear, tobacco, marijuana and ETOH use, remote hx crack cocaine use.   OT comments  Pt progressing towards established OT goals and demonstrating increased arousal and engagement. Pt performing self feeding and grooming tasks at EOB with Min-Max A for sitting balance and Mod A for bilateral coordination. Max cues and significant time to sequence task. Pt performing sit<>stand with Mod A +2. Continue to recommend dc to post-acute rehab and will continue to follow acutely as admitted.    Follow Up Recommendations  SNF    Equipment Recommendations  3 in 1 bedside commode    Recommendations for Other Services Rehab consult    Precautions / Restrictions Precautions Precautions: Fall Precaution Comments: mittens, bed posey, tennis shoes to decrease sensitivity with standing       Mobility Bed Mobility Overal bed mobility: Needs Assistance Bed Mobility: Supine to Sit;Sit to Supine   Sidelying to sit: Total assist;+2 for physical assistance Supine to sit: Total assist;+2 for physical assistance     General bed mobility comments: Total assist +2 for progression to sitting and back to supine.  Pt unable to initiate movement to EOB even with multimodal cues.    Transfers Overall transfer level: Needs assistance Equipment used: 2 person hand held assist Transfers: Sit to/from Stand Sit to Stand: +2 physical assistance;Mod assist         General transfer comment: Two person mod assist to stand EOB with bil LEs blocked, posterior preference cues for extending or straightening knees. Feet did not  seem sensitive or painful in standing, but did with donning of socks in bed prior to sitting up.    Balance Overall balance assessment: Needs assistance Sitting-balance support: Feet supported;No upper extremity supported Sitting balance-Leahy Scale: Zero Sitting balance - Comments: total up to min assist EOB.  Pt with trunk flexion at times and when attempting to scoot forward will pitch posteriorly and to the right. Postural control: Posterior lean;Right lateral lean Standing balance support: Bilateral upper extremity supported Standing balance-Leahy Scale: Poor Standing balance comment: bil UE supported posterior lean and mod assist to maintain standing, difficuly transitioning to sit down as well as pt kept hips extended, manual asssist to flex hips down to sit.                           ADL either performed or assessed with clinical judgement   ADL Overall ADL's : Needs assistance/impaired Eating/Feeding: Moderate assistance;Sitting Eating/Feeding Details (indicate cue type and reason): Max support while sitting at EOB. Pt performing self feeding task (with apple sauce, yogart, and water with straw). Pt abel to grasp spoon and then scoop item. Then bringing to mouth. Max cues throughou for each step and pt very very internally distracted Grooming: Wash/dry face;Maximal assistance;Sitting                               Functional mobility during ADLs: Moderate assistance;+2 for physical assistance;+2 for safety/equipment General ADL Comments: Pt performing self feeding and grooming tasks while sitting at EOB. Performing sit<>Stand from EOB.  Vision       Perception     Praxis      Cognition Arousal/Alertness: Awake/alert Behavior During Therapy: Flat affect Overall Cognitive Status: Impaired/Different from baseline Area of Impairment: Orientation;Attention;Memory;Following commands;Safety/judgement;Awareness;Problem solving                  Orientation Level: Place;Time;Situation Current Attention Level: Focused Memory: Decreased short-term memory Following Commands: Follows one step commands inconsistently;Follows one step commands with increased time Safety/Judgement: Decreased awareness of safety;Decreased awareness of deficits Awareness: Intellectual Problem Solving: Decreased initiation;Difficulty sequencing;Requires verbal cues;Requires tactile cues;Slow processing General Comments: Pt alert, speaking in some sentances that are making more sense to context.  Very very short attention making it difficult to complete a task without multimodal cues.  Slow processing time as well which is difficult because by the time he processes what you say, he loses his attention.        Exercises     Shoulder Instructions       General Comments      Pertinent Vitals/ Pain       Pain Assessment: Faces Faces Pain Scale: Hurts little more Pain Location: generalized Pain Descriptors / Indicators: Grimacing;Guarding Pain Intervention(s): Monitored during session;Limited activity within patient's tolerance;Repositioned  Home Living                                          Prior Functioning/Environment              Frequency  Min 2X/week        Progress Toward Goals  OT Goals(current goals can now be found in the care plan section)  Progress towards OT goals: Progressing toward goals  Acute Rehab OT Goals Patient Stated Goal: agreeable to session, mother reports desire to ambulate OT Goal Formulation: Patient unable to participate in goal setting Time For Goal Achievement: 06/14/21 Potential to Achieve Goals: Good ADL Goals Pt Will Perform Grooming: with min assist;sitting Pt Will Perform Lower Body Dressing: with mod assist;sitting/lateral leans;sit to/from stand Pt Will Perform Toileting - Clothing Manipulation and hygiene: with mod assist;sit to/from stand Pt/caregiver will Perform Home  Exercise Program: Increased ROM;Increased strength;Both right and left upper extremity;With minimal assist;With written HEP provided Additional ADL Goal #1: Pt will follow 1-2 step commands with minimal cues to attend to task in 3/5 trials. Additional ADL Goal #2: Pt will utilize strategies for L inattention in order to participate with tasks on L side with minimal cues in 3/5 trials.  Plan Discharge plan remains appropriate    Co-evaluation    PT/OT/SLP Co-Evaluation/Treatment: Yes Reason for Co-Treatment: For patient/therapist safety;To address functional/ADL transfers PT goals addressed during session: Mobility/safety with mobility;Balance OT goals addressed during session: ADL's and self-care      AM-PAC OT "6 Clicks" Daily Activity     Outcome Measure   Help from another person eating meals?: A Lot Help from another person taking care of personal grooming?: A Lot Help from another person toileting, which includes using toliet, bedpan, or urinal?: Total Help from another person bathing (including washing, rinsing, drying)?: A Lot Help from another person to put on and taking off regular upper body clothing?: A Lot Help from another person to put on and taking off regular lower body clothing?: Total 6 Click Score: 10    End of Session Equipment Utilized During Treatment: Gait belt  OT Visit Diagnosis: Unsteadiness on  feet (R26.81);Muscle weakness (generalized) (M62.81);Other symptoms and signs involving cognitive function   Activity Tolerance Patient tolerated treatment well   Patient Left in bed;with call bell/phone within reach;with bed alarm set;with restraints reapplied   Nurse Communication Mobility status        Time: 1324-4010 OT Time Calculation (min): 25 min  Charges: OT General Charges $OT Visit: 1 Visit OT Treatments $Self Care/Home Management : 8-22 mins  Deryl Ports MSOT, OTR/L Acute Rehab Pager: [phone] Office: [phone]   Theodoro Grist  Yocelin Vanlue 06/11/2021, 3:33 PM

## 2021-06-11 NOTE — Progress Notes (Signed)
[facility] PROGRESS NOTE    Progress Note  [name] [name]  FUX:[id number] DOB: 1961-03-12 DOA: 05/19/2021 PCP: Pcp, No     Brief Narrative:   [name] [name] is an 60 y.o. male past medical history past medical history of alcohol abuse and drug abuse who suffered a subarachnoid hemorrhage status post coiling of the anterior communicating aneurysm, underwent VP shunting on 06/09/2019  Significant Events: 5/25 Admitted with [facility] secondary to ruptured aneurysm, ventriculostomy preformed in ED, Ancef started 5/26 Coil Embolization 5/29 worsening neurological exam consistent with vasospasm, hemodynamic augmentation initiated. 6/1 worsening hyponatremia: Na of 120.  Patient started on salt tablets 1 g twice daily and fludrocortisone 0.1 mg. 6/2 BP goal reduced 160 -180.  Salt Tablet increased to 1 g tid. Fludrocortisone increased 0.2 mg. 6/8 Ongoing agitation, intermittently following commands. CT Head with worsening hydrocephalus despite EVD, open to 10 per NSGY. 6/9 EVD remains in place, repeat head CT as below 6/10-12, no significant neuro changes, intermittently agitated.  Ongoing EVD, plans for VP shunt next week 6/13 no neuro changes, remains intermittently restless 6/14 went for VP shunt, no neuro changes, afebrile  Assessment/Plan:   Acute metabolic encephalopathy subarachnoid hemorrhage secondary to aneurysm rupture: With obstructive hydrocephalus status post EVD on 06/08/2021. Core track in place continue nutrition per NG tube remains in the dysphagia 1 diet. Speech to see him again to see if his diet can be advanced and we could discontinue core track. Continue seizure precautions and aspiration precautions. He is only responsive to painful stimuli and verbal but not able to answer yes or no questions. Awaiting IR evaluation as physical therapy recommended inpatient rehab  Hyponatremia suspect cerebral salt wasting syndrome: Resolved discontinue salt  tablets.  Diarrhea: Continue loperamide.  Resolved  Physical deconditioning in the setting of subarachnoid hemorrhage: PT and OT was consulted CIR. Awaiting inpatient rehab evaluation.  Nutrition: Continue nutrition per core track.  Leukocytosis: Likely stress margination has remained afebrile, continue to hold antibiotics.    DVT prophylaxis: scd Family Communication:none Status is: Inpatient  Remains inpatient appropriate because:Hemodynamically unstable  Dispo: The patient is from: Home              Anticipated d/c is to: CIR              Patient currently is not medically stable to d/c.   Difficult to place patient No    Code Status:     Code Status Orders  (From admission, onward)           Start     Ordered   05/19/21 2022  Full code  Continuous        05/19/21 2022           Code Status History     Date Active Date Inactive Code Status Order ID Comments User Context   10/31/2016 2152 11/01/2016 1945 Full Code [id number]  Clifton Custard Inpatient   10/31/2016 1308 10/31/2016 2047 Full Code [id number]  Dionne Ano ED   06/13/2013 1417 06/13/2013 2022 Full Code [id number]  Emilia Beck, PA-C ED         IV Access:   Peripheral IV   Procedures and diagnostic studies:   No results found.   Medical Consultants:   None.   Subjective:    [name] [name] nonverbal, lethargic  Objective:    Vitals:   06/11/21 0100 06/11/21 0200 06/11/21 0304 06/11/21 0757  BP: 118/90 (!) 125/96 123/89 129/77  Pulse: 94  91 87 86  Resp: (!) 23 18 19 18   Temp:   97.9 F (36.6 C) 97.6 F (36.4 C)  TempSrc:   Oral Oral  SpO2: 98% 98% 99% 100%  Weight:      Height:       SpO2: 100 % O2 Flow Rate (L/min): 2 L/min   Intake/Output Summary (Last 24 hours) at 06/11/2021 0959 Last data filed at 06/11/2021 0200 Gross per 24 hour  Intake 390 ml  Output 900 ml  Net -510 ml    Filed Weights   06/07/21 0500 06/08/21 0500  06/09/21 0359  Weight: 60.7 kg 60.5 kg 60 kg    Exam: General exam: In no acute distress. Respiratory system: Good air movement and clear to auscultation. Cardiovascular system: S1 & S2 heard, RRR. No JVD. Gastrointestinal system: Abdomen is nondistended, soft and nontender.  Central nervous system: Lethargic Extremities: No pedal edema. Skin: No rashes, lesions or ulcers  Data Reviewed:    Labs: Basic Metabolic Panel: Recent Labs  Lab 06/05/21 0731 06/08/21 0620 06/11/21 0821  NA 137 144 145  K 4.2 3.9 3.9  CL 100 110 108  CO2 30 28 28   GLUCOSE 146* 117* 116*  BUN 16 15 15   CREATININE 0.56* 0.57* 0.64  CALCIUM 9.5 9.9 9.8    GFR Estimated Creatinine Clearance: 84.4 mL/min (by C-G formula based on SCr of 0.64 mg/dL). Liver Function Tests: No results for input(s): AST, ALT, ALKPHOS, BILITOT, PROT, ALBUMIN in the last 168 hours. No results for input(s): LIPASE, AMYLASE in the last 168 hours. No results for input(s): AMMONIA in the last 168 hours. Coagulation profile No results for input(s): INR, PROTIME in the last 168 hours. COVID-19 Labs  No results for input(s): DDIMER, FERRITIN, LDH, CRP in the last 72 hours.  Lab Results  Component Value Date   SARSCOV2NAA NEGATIVE 05/19/2021   SARSCOV2NAA NEGATIVE 05/10/2021   SARSCOV2NAA NEGATIVE 05/05/2021   SARSCOV2NAA NEGATIVE 10/24/2020    CBC: Recent Labs  Lab 06/05/21 0731 06/06/21 0559 06/07/21 0517 06/08/21 0620 06/11/21 0821  WBC 7.5 8.0 8.5 11.1* 6.3  NEUTROABS 5.2 5.7 6.1  --   --   HGB 12.8* 12.7* 12.4* 12.9* 12.5*  HCT 38.6* 38.2* 38.1* 39.3 39.1  MCV 97.7 100.5* 101.6* 100.5* 101.3*  PLT 648* 625* 634* 473* 411*    Cardiac Enzymes: No results for input(s): CKTOTAL, CKMB, CKMBINDEX, TROPONINI in the last 168 hours. BNP (last 3 results) No results for input(s): PROBNP in the last 8760 hours. CBG: Recent Labs  Lab 06/10/21 1540 06/10/21 1927 06/10/21 2315 06/11/21 0758 06/11/21 0808   GLUCAP 125* 112* 105* 124* 119*    D-Dimer: No results for input(s): DDIMER in the last 72 hours. Hgb A1c: No results for input(s): HGBA1C in the last 72 hours. Lipid Profile: No results for input(s): CHOL, HDL, LDLCALC, TRIG, CHOLHDL, LDLDIRECT in the last 72 hours. Thyroid function studies: No results for input(s): TSH, T4TOTAL, T3FREE, THYROIDAB in the last 72 hours.  Invalid input(s): FREET3 Anemia work up: No results for input(s): VITAMINB12, FOLATE, FERRITIN, TIBC, IRON, RETICCTPCT in the last 72 hours. Sepsis Labs: Recent Labs  Lab 06/06/21 0559 06/07/21 0517 06/08/21 0620 06/11/21 0821  WBC 8.0 8.5 11.1* 6.3    Microbiology Recent Results (from the past 240 hour(s))  Surgical PCR screen     Status: None   Collection Time: 06/08/21 11:33 AM   Specimen: Nasal Mucosa; Nasal Swab  Result Value Ref Range Status   MRSA, PCR  NEGATIVE NEGATIVE Final   Staphylococcus aureus NEGATIVE NEGATIVE Final    Comment: (NOTE) The Xpert SA Assay (FDA approved for NASAL specimens in patients 54 years of age and older), is one component of a comprehensive surveillance program. It is not intended to diagnose infection nor to guide or monitor treatment. Performed at [facility], [address], [place], [name] [address]      Medications:    chlorhexidine  15 mL Mouth Rinse BID   Chlorhexidine Gluconate Cloth  6 each Topical Daily   feeding supplement  237 mL Oral BID BM   feeding supplement (JEVITY 1.5 CAL/FIBER)  980 mL Per Tube Q24H   feeding supplement (PROSource TF)  45 mL Per Tube BID   fiber  1 packet Per Tube BID   heparin injection (subcutaneous)  5,000 Units Subcutaneous Q8H   mouth rinse  15 mL Mouth Rinse q12n4p   sodium chloride  1 g Per Tube BID WC   Continuous Infusions:      LOS: 23 days   Marinda Elk  [facility]  06/11/2021, 9:59 AM

## 2021-06-11 NOTE — Progress Notes (Addendum)
Physical Therapy Treatment Patient Details Name: [name] [name] MRN: [id number] DOB: 14-Apr-1961 Today's Date: 06/11/2021    History of Present Illness Pt is a 60 y.o. M who was admitted to ICU 05/19/21 with IVH with large acute SAH now s/p ventriculostomy. 5/26 coil embolization. 5/29 worsening neurological exam consistent with vasospasm. VP shunt placement 6/14. Significant PMH: rotator cuff tear, tobacco, marijuana and ETOH use, remote hx crack cocaine use.    PT Comments    Co-session with PT, OT, and SLP working on arousal, following commands, attempting to sustain attention for seated self feeding task.  Pt speaking in sentences, seemingly doing much better than the last time the SLP saw him cognitively.  He was able to stand with two person hand held assist with posterior preference and difficulty transitioning back to sitting EOB. PT will continue to follow acutely for safe mobility progression.  Per CIR admission coordinator's conversation with pt's mother, plan is now SNF.   Follow Up Recommendations  SNF     Equipment Recommendations  Wheelchair (measurements PT);Wheelchair cushion (measurements PT);Hospital bed;3in1 (PT);Other (comment) (hoyer lift)    Recommendations for Other Services       Precautions / Restrictions Precautions Precautions: Fall Precaution Comments: wrist restraints, mittens, bed posey, tennis shoes to decrease sensitivity with standing    Mobility  Bed Mobility Overal bed mobility: Needs Assistance Bed Mobility: Supine to Sit;Sit to Supine   Sidelying to sit: Total assist;+2 for physical assistance Supine to sit: Total assist;+2 for physical assistance     General bed mobility comments: Total assist +2 for progression to sitting and back to supine.  Pt unable to initiate movement to EOB even with multimodal cues.    Transfers Overall transfer level: Needs assistance Equipment used: 2 person hand held assist Transfers: Sit to/from Stand Sit  to Stand: +2 physical assistance;Mod assist         General transfer comment: Two person mod assist to stand EOB with bil LEs blocked, posterior preference cues for extending or straightening knees. Feet did not seem sensitive or painful in standing, but did with donning of socks in bed prior to sitting up.  Ambulation/Gait                 Stairs             Wheelchair Mobility    Modified Rankin (Stroke Patients Only) Modified Rankin (Stroke Patients Only) Pre-Morbid Rankin Score: No symptoms Modified Rankin: Moderately severe disability     Balance Overall balance assessment: Needs assistance Sitting-balance support: Feet supported;No upper extremity supported Sitting balance-Leahy Scale: Zero Sitting balance - Comments: total up to min assist EOB.  Pt with trunk flexion at times and when attempting to scoot forward will pitch posteriorly and to the right. Postural control: Posterior lean;Right lateral lean Standing balance support: Bilateral upper extremity supported Standing balance-Leahy Scale: Poor Standing balance comment: bil UE supported posterior lean and mod assist to maintain standing, difficuly transitioning to sit down as well as pt kept hips extended, manual asssist to flex hips down to sit.                            Cognition Arousal/Alertness: Awake/alert Behavior During Therapy: Flat affect Overall Cognitive Status: Impaired/Different from baseline Area of Impairment: Orientation;Attention;Memory;Following commands;Safety/judgement;Awareness;Problem solving                 Orientation Level: Place;Time;Situation Current Attention Level: Focused Memory: Decreased short-term  memory Following Commands: Follows one step commands inconsistently;Follows one step commands with increased time Safety/Judgement: Decreased awareness of safety;Decreased awareness of deficits Awareness: Intellectual Problem Solving: Decreased  initiation;Difficulty sequencing;Requires verbal cues;Requires tactile cues;Slow processing General Comments: Pt alert, speaking in some sentances that are making more sense to context.  Very very short attention making it difficult to complete a task without multimodal cues.  Slow processing time as well which is difficult because by the time he processes what you say, he loses his attention.      Exercises      General Comments        Pertinent Vitals/Pain Pain Assessment: Faces Faces Pain Scale: Hurts little more Pain Location: generalized Pain Descriptors / Indicators: Grimacing;Guarding Pain Intervention(s): Limited activity within patient's tolerance;Monitored during session;Repositioned    Home Living                      Prior Function            PT Goals (current goals can now be found in the care plan section) Progress towards PT goals: Progressing toward goals    Frequency    Min 3X/week      PT Plan Discharge plan needs to be updated    Co-evaluation PT/OT/SLP Co-Evaluation/Treatment: Yes Reason for Co-Treatment: Necessary to address cognition/behavior during functional activity;Complexity of the patient's impairments (multi-system involvement);For patient/therapist safety;To address functional/ADL transfers PT goals addressed during session: Mobility/safety with mobility;Balance        AM-PAC PT "6 Clicks" Mobility   Outcome Measure  Help needed turning from your back to your side while in a flat bed without using bedrails?: Total Help needed moving from lying on your back to sitting on the side of a flat bed without using bedrails?: Total Help needed moving to and from a bed to a chair (including a wheelchair)?: A Lot Help needed standing up from a chair using your arms (e.g., wheelchair or bedside chair)?: A Lot Help needed to walk in hospital room?: Total Help needed climbing 3-5 steps with a railing? : Total 6 Click Score: 8    End of  Session Equipment Utilized During Treatment: Gait belt Activity Tolerance: Patient limited by fatigue Patient left: in bed;with call bell/phone within reach;with bed alarm set;with restraints reapplied (lap restraint only as RN tech was coming into bathe pt, left mittens off and asked her to re-apply.)   PT Visit Diagnosis: Unsteadiness on feet (R26.81);Muscle weakness (generalized) (M62.81);Difficulty in walking, not elsewhere classified (R26.2);Other abnormalities of gait and mobility (R26.89)     Time: 7124-5809 PT Time Calculation (min) (ACUTE ONLY): 31 min  Charges:  $Therapeutic Activity: 8-22 mins                     {Janaysia Mcleroy Merilyn Baba, DPT  Acute Rehabilitation Ortho Tech Supervisor [phone] pager [phone]) [phone] office

## 2021-06-11 NOTE — Progress Notes (Signed)
  Speech Language Pathology Treatment: Dysphagia;Cognitive-Linquistic  Patient Details Name: [name] [name] MRN: [id number] DOB: 12-01-1961 Today's Date: 06/11/2021 Time: 0254-2706 SLP Time Calculation (min) (ACUTE ONLY): 31 min  Assessment / Plan / Recommendation Clinical Impression  [name] [name] demonstrated increased verbalizations today, responding to questions in single words and sentence level more than half the time. Pt seen with PT/OT to facilitate use of mobility and function to increase activity. His distractibility remains however improved post shunt needing moderate-max assist for initiation of various tasks. He needs increased processing time, choices and smaller chunks of information.   Accepted, with manual cueing, several bites puree, straw sips water and solid texture cracker - no concerns of aspiration. Presently, intake appears more dependent on his cognitive ability. Uncertain of his intake- suspect it is not sufficient and this may continue with cognitive impairments. He will likely need longer term source of nutrition.    HPI HPI: Pt is a 79 you w/ PMH sinusitis, rotator cuff tear, tobacco, marijuana, and ETOH use, remote hx of crack cocaine use who is being managed for parenchymal hemorrhage within the anterior  left frontal lobe. Mild surrounding edema within the anterior left  frontal lobe, now s/p ventriculostomy. Ventric removed and shunt placed 6/14.      SLP Plan  Continue with current plan of care       Recommendations  Diet recommendations: Thin liquid;Dysphagia 1 (puree) Liquids provided via: Cup;Straw Medication Administration: Via alternative means Supervision: Full supervision/cueing for compensatory strategies;Staff to assist with self feeding Compensations: Minimize environmental distractions;Slow rate;Small sips/bites Postural Changes and/or Swallow Maneuvers: Seated upright 90 degrees                Oral Care Recommendations: Oral care QID Follow up  Recommendations: Skilled Nursing facility SLP Visit Diagnosis: Cognitive communication deficit (R41.841);Dysphagia, unspecified (R13.10) Plan: Continue with current plan of care       GO                Royce Macadamia 06/11/2021, 3:00 PM

## 2021-06-11 NOTE — Progress Notes (Signed)
  NEUROSURGERY PROGRESS NOTE   No issues overnight.   EXAM:  BP 120/84 (BP Location: Left Arm)   Pulse 89   Temp 97.7 F (36.5 C) (Oral)   Resp 18   Ht 5\' 9"  (1.753 m)   Wt 60 kg   SpO2 100%   BMI 19.53 kg/m   Awake, alert Occasionally says name CN grossly intact  Intermittently FC MAE well Wounds c/d/i  IMPRESSION:  60 y.o. male SAH d#24 s/p Acom coiling, POD#3 s/p right VPS. Remains stable  PLAN: - Will need SNF placement - Cont PT/OT while here   47, MD [facility]

## 2021-06-11 NOTE — Progress Notes (Signed)
[facility] IP rehab admissions - I spoke with patient's 60 yo mom today.  Mom now feels that she will not be able to manage patient at home and that patient will likely need SNF placement upon DC.  Patient  has a sister who works and no other caregiver support.  Recommend pursuit of SNF at DC.  Call for questions.  [phone]

## 2021-06-12 DIAGNOSIS — I609 Nontraumatic subarachnoid hemorrhage, unspecified: Secondary | ICD-10-CM | POA: Diagnosis not present

## 2021-06-12 DIAGNOSIS — I608 Other nontraumatic subarachnoid hemorrhage: Secondary | ICD-10-CM | POA: Diagnosis not present

## 2021-06-12 DIAGNOSIS — I67848 Other cerebrovascular vasospasm and vasoconstriction: Secondary | ICD-10-CM | POA: Diagnosis not present

## 2021-06-12 DIAGNOSIS — Z4659 Encounter for fitting and adjustment of other gastrointestinal appliance and device: Secondary | ICD-10-CM | POA: Diagnosis not present

## 2021-06-12 LAB — GLUCOSE, CAPILLARY
Glucose-Capillary: 126 mg/dL — ABNORMAL HIGH (ref 70–99)
Glucose-Capillary: 141 mg/dL — ABNORMAL HIGH (ref 70–99)
Glucose-Capillary: 113 mg/dL — ABNORMAL HIGH (ref 70–99)
Glucose-Capillary: 108 mg/dL — ABNORMAL HIGH (ref 70–99)
Glucose-Capillary: 132 mg/dL — ABNORMAL HIGH (ref 70–99)

## 2021-06-12 NOTE — Progress Notes (Signed)
Neurosurgery Service Progress Note  Subjective: No acute events overnight, no new complaints this morning   Objective: Vitals:   06/11/21 1959 06/11/21 2320 06/12/21 0421 06/12/21 0837  BP: 138/82 133/86 (!) 131/95 114/79  Pulse: 99 96 95 92  Resp: 18 17 18 18   Temp: 98.3 F (36.8 C) 98.6 F (37 C) 98.2 F (36.8 C) 98 F (36.7 C)  TempSrc: Oral Oral Oral   SpO2: 98% 99% 100% 99%  Weight:      Height:        Physical Exam: Awake/alert, nonverbal, follows axial commands, PERRL, gaze conjugate, MAEx4 Incisions c/d/i  Assessment & Plan: 60 y.o. ruptured Acomm s/p coiling w/ HCP s/p VPS.  -no change in neurosurgical plan of care, SNF placement pending  47  06/12/21 9:04 AM

## 2021-06-12 NOTE — Progress Notes (Signed)
[facility] PROGRESS NOTE    Progress Note  [name] [name]  YKZ:[id number] DOB: 10-May-1961 DOA: 05/19/2021 PCP: Pcp, No     Brief Narrative:   [name] [name] is an 60 y.o. male past medical history past medical history of alcohol abuse and drug abuse who suffered a subarachnoid hemorrhage status post coiling of the anterior communicating aneurysm, underwent VP shunting on 06/09/2019  Significant Events: 5/25 Admitted with [facility] secondary to ruptured aneurysm, ventriculostomy preformed in ED, Ancef started 5/26 Coil Embolization 5/29 worsening neurological exam consistent with vasospasm, hemodynamic augmentation initiated. 6/1 worsening hyponatremia: Na of 120.  Patient started on salt tablets 1 g twice daily and fludrocortisone 0.1 mg. 6/2 BP goal reduced 160 -180.  Salt Tablet increased to 1 g tid. Fludrocortisone increased 0.2 mg. 6/8 Ongoing agitation, intermittently following commands. CT Head with worsening hydrocephalus despite EVD, open to 10 per NSGY. 6/9 EVD remains in place, repeat head CT as below 6/10-12, no significant neuro changes, intermittently agitated.  Ongoing EVD, plans for VP shunt next week 6/13 no neuro changes, remains intermittently restless 6/14 went for VP shunt, no neuro changes, afebrile  Awaiting skilled nursing facility placement.  Assessment/Plan:   Acute metabolic encephalopathy subarachnoid hemorrhage secondary to aneurysm rupture: With obstructive hydrocephalus status post EVD on 06/08/2021. Core track in place continue nutrition per NG tube remains in the dysphagia 1 diet. Speech to see him again to see if his diet can be advanced and we could discontinue core track. Continue seizure precautions and aspiration precautions. He is only responsive to painful stimuli and verbal but not able to answer yes or no questions. Physical therapy evaluated the patient and recommended inpatient rehab the patient cannot back home as his sister works and  he has no caregivers at home. Will pursue skilled nursing facility placement.  Hyponatremia suspect cerebral salt wasting syndrome: Resolved discontinue salt tablets.  Diarrhea: Continue loperamide.  Resolved  Physical deconditioning in the setting of subarachnoid hemorrhage: PT and OT was consulted CIR. Awaiting inpatient rehab evaluation.  Nutrition: Continue nutrition per core track.  We will go ahead and stop tube feedings to encourage him to eat.  Leukocytosis: Likely stress margination has remained afebrile, continue to hold antibiotics.    DVT prophylaxis: scd Family Communication:none Status is: Inpatient  Remains inpatient appropriate because:Hemodynamically unstable  Dispo: The patient is from: Home              Anticipated d/c is to: CIR              Patient currently is not medically stable to d/c.   Difficult to place patient No    Code Status:     Code Status Orders  (From admission, onward)           Start     Ordered   05/19/21 2022  Full code  Continuous        05/19/21 2022           Code Status History     Date Active Date Inactive Code Status Order ID Comments User Context   10/31/2016 2152 11/01/2016 1945 Full Code [id number]  Clifton Custard Inpatient   10/31/2016 1308 10/31/2016 2047 Full Code [id number]  Dionne Ano ED   06/13/2013 1417 06/13/2013 2022 Full Code [id number]  Emilia Beck, PA-C ED         IV Access:   Peripheral IV   Procedures and diagnostic studies:   No results found.  Medical Consultants:   None.   Subjective:    [name] [name] continues to be nonverbal lethargic  Objective:    Vitals:   06/11/21 1530 06/11/21 1959 06/11/21 2320 06/12/21 0421  BP: (!) 120/92 138/82 133/86 (!) 131/95  Pulse: 92 99 96 95  Resp: 16 18 17 18   Temp: 97.7 F (36.5 C) 98.3 F (36.8 C) 98.6 F (37 C) 98.2 F (36.8 C)  TempSrc: Oral Oral Oral Oral  SpO2: 100% 98% 99% 100%   Weight:      Height:       SpO2: 100 % O2 Flow Rate (L/min): 2 L/min   Intake/Output Summary (Last 24 hours) at 06/12/2021 0820 Last data filed at 06/11/2021 1800 Gross per 24 hour  Intake --  Output 550 ml  Net -550 ml    Filed Weights   06/07/21 0500 06/08/21 0500 06/09/21 0359  Weight: 60.7 kg 60.5 kg 60 kg    Exam: General exam: In no acute distress. Respiratory system: Good air movement and clear to auscultation. Cardiovascular system: S1 & S2 heard, RRR. No JVD. Gastrointestinal system: Abdomen is nondistended, soft and nontender.  Extremities: No pedal edema. Skin: No rashes, lesions or ulcers  Data Reviewed:    Labs: Basic Metabolic Panel: Recent Labs  Lab 06/08/21 0620 06/11/21 0821  NA 144 145  K 3.9 3.9  CL 110 108  CO2 28 28  GLUCOSE 117* 116*  BUN 15 15  CREATININE 0.57* 0.64  CALCIUM 9.9 9.8    GFR Estimated Creatinine Clearance: 84.4 mL/min (by C-G formula based on SCr of 0.64 mg/dL). Liver Function Tests: No results for input(s): AST, ALT, ALKPHOS, BILITOT, PROT, ALBUMIN in the last 168 hours. No results for input(s): LIPASE, AMYLASE in the last 168 hours. No results for input(s): AMMONIA in the last 168 hours. Coagulation profile No results for input(s): INR, PROTIME in the last 168 hours. COVID-19 Labs  No results for input(s): DDIMER, FERRITIN, LDH, CRP in the last 72 hours.  Lab Results  Component Value Date   SARSCOV2NAA NEGATIVE 05/19/2021   SARSCOV2NAA NEGATIVE 05/10/2021   SARSCOV2NAA NEGATIVE 05/05/2021   SARSCOV2NAA NEGATIVE 10/24/2020    CBC: Recent Labs  Lab 06/06/21 0559 06/07/21 0517 06/08/21 0620 06/11/21 0821  WBC 8.0 8.5 11.1* 6.3  NEUTROABS 5.7 6.1  --   --   HGB 12.7* 12.4* 12.9* 12.5*  HCT 38.2* 38.1* 39.3 39.1  MCV 100.5* 101.6* 100.5* 101.3*  PLT 625* 634* 473* 411*    Cardiac Enzymes: No results for input(s): CKTOTAL, CKMB, CKMBINDEX, TROPONINI in the last 168 hours. BNP (last 3 results) No  results for input(s): PROBNP in the last 8760 hours. CBG: Recent Labs  Lab 06/11/21 1136 06/11/21 1527 06/11/21 1957 06/11/21 2318 06/12/21 0422  GLUCAP 116* 105* 148* 119* 126*    D-Dimer: No results for input(s): DDIMER in the last 72 hours. Hgb A1c: No results for input(s): HGBA1C in the last 72 hours. Lipid Profile: No results for input(s): CHOL, HDL, LDLCALC, TRIG, CHOLHDL, LDLDIRECT in the last 72 hours. Thyroid function studies: No results for input(s): TSH, T4TOTAL, T3FREE, THYROIDAB in the last 72 hours.  Invalid input(s): FREET3 Anemia work up: No results for input(s): VITAMINB12, FOLATE, FERRITIN, TIBC, IRON, RETICCTPCT in the last 72 hours. Sepsis Labs: Recent Labs  Lab 06/06/21 0559 06/07/21 0517 06/08/21 0620 06/11/21 0821  WBC 8.0 8.5 11.1* 6.3    Microbiology Recent Results (from the past 240 hour(s))  Surgical PCR screen  Status: None   Collection Time: 06/08/21 11:33 AM   Specimen: Nasal Mucosa; Nasal Swab  Result Value Ref Range Status   MRSA, PCR NEGATIVE NEGATIVE Final   Staphylococcus aureus NEGATIVE NEGATIVE Final    Comment: (NOTE) The Xpert SA Assay (FDA approved for NASAL specimens in patients 43 years of age and older), is one component of a comprehensive surveillance program. It is not intended to diagnose infection nor to guide or monitor treatment. Performed at [facility], [address] [place], [name] [address]      Medications:    chlorhexidine  15 mL Mouth Rinse BID   Chlorhexidine Gluconate Cloth  6 each Topical Daily   feeding supplement  237 mL Oral BID BM   feeding supplement (JEVITY 1.5 CAL/FIBER)  980 mL Per Tube Q24H   feeding supplement (PROSource TF)  45 mL Per Tube BID   fiber  1 packet Per Tube BID   heparin injection (subcutaneous)  5,000 Units Subcutaneous Q8H   mouth rinse  15 mL Mouth Rinse q12n4p   sodium chloride  1 g Per Tube BID WC   Continuous Infusions:      LOS: 24 days    Marinda Elk  [facility]  06/12/2021, 8:20 AM

## 2021-06-12 NOTE — Plan of Care (Signed)
  Problem: Education: Goal: Knowledge of General Education information will improve Description: Including pain rating scale, medication(s)/side effects and non-pharmacologic comfort measures Outcome: Not Progressing   Problem: Health Behavior/Discharge Planning: Goal: Ability to manage health-related needs will improve Outcome: Not Progressing   Problem: Clinical Measurements: Goal: Ability to maintain clinical measurements within normal limits will improve Outcome: Not Progressing Goal: Will remain free from infection Outcome: Not Progressing Goal: Diagnostic test results will improve Outcome: Not Progressing Goal: Respiratory complications will improve Outcome: Not Progressing Goal: Cardiovascular complication will be avoided Outcome: Not Progressing   Problem: Activity: Goal: Risk for activity intolerance will decrease Outcome: Not Progressing   Problem: Nutrition: Goal: Adequate nutrition will be maintained Outcome: Not Progressing   Problem: Coping: Goal: Level of anxiety will decrease Outcome: Not Progressing   Problem: Elimination: Goal: Will not experience complications related to bowel motility Outcome: Not Progressing Goal: Will not experience complications related to urinary retention Outcome: Not Progressing   Problem: Pain Managment: Goal: General experience of comfort will improve Outcome: Not Progressing   Problem: Safety: Goal: Ability to remain free from injury will improve Outcome: Not Progressing   Problem: Skin Integrity: Goal: Risk for impaired skin integrity will decrease Outcome: Not Progressing   Problem: Education: Goal: Knowledge of disease or condition will improve Outcome: Not Progressing Goal: Knowledge of secondary prevention will improve Outcome: Not Progressing Goal: Knowledge of patient specific risk factors addressed and post discharge goals established will improve Outcome: Not Progressing Goal: Individualized Educational  Video(s) Outcome: Not Progressing   Problem: Safety: Goal: Non-violent Restraint(s) Outcome: Not Progressing

## 2021-06-12 NOTE — Plan of Care (Signed)
  Problem: Education: Goal: Knowledge of General Education information will improve Description: Including pain rating scale, medication(s)/side effects and non-pharmacologic comfort measures Outcome: Progressing   Problem: Health Behavior/Discharge Planning: Goal: Ability to manage health-related needs will improve Outcome: Progressing   Problem: Clinical Measurements: Goal: Ability to maintain clinical measurements within normal limits will improve Outcome: Progressing Goal: Will remain free from infection Outcome: Progressing Goal: Diagnostic test results will improve Outcome: Progressing Goal: Respiratory complications will improve Outcome: Progressing Goal: Cardiovascular complication will be avoided Outcome: Progressing   Problem: Activity: Goal: Risk for activity intolerance will decrease Outcome: Progressing   Problem: Nutrition: Goal: Adequate nutrition will be maintained Outcome: Progressing   Problem: Coping: Goal: Level of anxiety will decrease Outcome: Progressing   Problem: Elimination: Goal: Will not experience complications related to bowel motility Outcome: Progressing Goal: Will not experience complications related to urinary retention Outcome: Progressing   Problem: Pain Managment: Goal: General experience of comfort will improve Outcome: Progressing   Problem: Safety: Goal: Ability to remain free from injury will improve Outcome: Progressing   Problem: Skin Integrity: Goal: Risk for impaired skin integrity will decrease Outcome: Progressing   Problem: Education: Goal: Knowledge of disease or condition will improve Outcome: Progressing Goal: Knowledge of secondary prevention will improve Outcome: Progressing Goal: Knowledge of patient specific risk factors addressed and post discharge goals established will improve Outcome: Progressing Goal: Individualized Educational Video(s) Outcome: Progressing   Problem: Safety: Goal: Non-violent  Restraint(s) Outcome: Progressing   

## 2021-06-13 ENCOUNTER — Inpatient Hospital Stay (HOSPITAL_COMMUNITY): Payer: 59

## 2021-06-13 DIAGNOSIS — I608 Other nontraumatic subarachnoid hemorrhage: Secondary | ICD-10-CM | POA: Diagnosis not present

## 2021-06-13 DIAGNOSIS — I67848 Other cerebrovascular vasospasm and vasoconstriction: Secondary | ICD-10-CM | POA: Diagnosis not present

## 2021-06-13 DIAGNOSIS — I609 Nontraumatic subarachnoid hemorrhage, unspecified: Secondary | ICD-10-CM | POA: Diagnosis not present

## 2021-06-13 DIAGNOSIS — Z4659 Encounter for fitting and adjustment of other gastrointestinal appliance and device: Secondary | ICD-10-CM | POA: Diagnosis not present

## 2021-06-13 LAB — CBC WITH DIFFERENTIAL/PLATELET
Abs Immature Granulocytes: 0.01 10*3/uL (ref 0.00–0.07)
Basophils Absolute: 0 10*3/uL (ref 0.0–0.1)
Basophils Relative: 1 %
Eosinophils Absolute: 0.3 10*3/uL (ref 0.0–0.5)
Eosinophils Relative: 4 %
HCT: 37.7 % — ABNORMAL LOW (ref 39.0–52.0)
Hemoglobin: 12.6 g/dL — ABNORMAL LOW (ref 13.0–17.0)
Immature Granulocytes: 0 %
Lymphocytes Relative: 21 %
Lymphs Abs: 1.3 10*3/uL (ref 0.7–4.0)
MCH: 33.3 pg (ref 26.0–34.0)
MCHC: 33.4 g/dL (ref 30.0–36.0)
MCV: 99.7 fL (ref 80.0–100.0)
Monocytes Absolute: 0.5 10*3/uL (ref 0.1–1.0)
Monocytes Relative: 7 %
Neutro Abs: 4.1 10*3/uL (ref 1.7–7.7)
Neutrophils Relative %: 67 %
Platelets: 344 10*3/uL (ref 150–400)
RBC: 3.78 MIL/uL — ABNORMAL LOW (ref 4.22–5.81)
RDW: 12.4 % (ref 11.5–15.5)
WBC: 6.2 10*3/uL (ref 4.0–10.5)
nRBC: 0 % (ref 0.0–0.2)

## 2021-06-13 LAB — GLUCOSE, CAPILLARY
Glucose-Capillary: 100 mg/dL — ABNORMAL HIGH (ref 70–99)
Glucose-Capillary: 104 mg/dL — ABNORMAL HIGH (ref 70–99)
Glucose-Capillary: 112 mg/dL — ABNORMAL HIGH (ref 70–99)
Glucose-Capillary: 122 mg/dL — ABNORMAL HIGH (ref 70–99)
Glucose-Capillary: 166 mg/dL — ABNORMAL HIGH (ref 70–99)

## 2021-06-13 LAB — BASIC METABOLIC PANEL WITH GFR
Anion gap: 6 (ref 5–15)
BUN: 16 mg/dL (ref 6–20)
CO2: 29 mmol/L (ref 22–32)
Calcium: 9.3 mg/dL (ref 8.9–10.3)
Chloride: 108 mmol/L (ref 98–111)
Creatinine, Ser: 0.58 mg/dL — ABNORMAL LOW (ref 0.61–1.24)
GFR, Estimated: >60 mL/min (ref >60)
Glucose, Bld: 102 mg/dL — ABNORMAL HIGH (ref 70–99)
Potassium: 3.8 mmol/L (ref 3.5–5.1)
Sodium: 143 mmol/L (ref 135–145)

## 2021-06-13 MED ORDER — FREE WATER
100.0000 mL | Freq: Three times a day (TID) | Status: DC
  Administered 2021-06-13 – 2021-06-14 (×5): 100 mL

## 2021-06-13 MED ORDER — JEVITY 1.5 CAL/FIBER PO LIQD
1000.0000 mL | ORAL | Status: DC
  Administered 2021-06-13 – 2021-06-17 (×5): 1000 mL
  Filled 2021-06-13 (×6): qty 1000

## 2021-06-13 NOTE — Progress Notes (Addendum)
[facility] PROGRESS NOTE    Progress Note  WOJCIECH WILLETTS  FOY:[id number] DOB: 1961/10/28 DOA: 05/19/2021 PCP: Pcp, No     Brief Narrative:   JUNIE ENGRAM is an 60 y.o. male past medical history past medical history of alcohol abuse and drug abuse who suffered a subarachnoid hemorrhage status post coiling of the anterior communicating aneurysm, underwent VP shunting on 06/09/2019  Significant Events: 5/25 Admitted with [facility] secondary to ruptured aneurysm, ventriculostomy preformed in ED, Ancef started 5/26 Coil Embolization 5/29 worsening neurological exam consistent with vasospasm, hemodynamic augmentation initiated. 6/1 worsening hyponatremia: Na of 120.  Patient started on salt tablets 1 g twice daily and fludrocortisone 0.1 mg. 6/2 BP goal reduced 160 -180.  Salt Tablet increased to 1 g tid. Fludrocortisone increased 0.2 mg. 6/8 Ongoing agitation, intermittently following commands. CT Head with worsening hydrocephalus despite EVD, open to 10 per NSGY. 6/9 EVD remains in place, repeat head CT as below 6/10-12, no significant neuro changes, intermittently agitated.  Ongoing EVD, plans for VP shunt next week 6/13 no neuro changes, remains intermittently restless 6/14 went for VP shunt, no neuro changes, afebrile  Awaiting skilled nursing facility placement.  Assessment/Plan:   Acute metabolic encephalopathy subarachnoid hemorrhage secondary to aneurysm rupture: With obstructive hydrocephalus status post EVD on 06/08/2021. Core track in place continue nutrition per NG tube remains in the dysphagia 1 diet.  He does not want anything to eat when they try to feed him. Not tolerating his diet we will ask IR to place PEG tube. Continue seizure precautions and aspiration precautions. Physical therapy evaluated the patient and recommended inpatient rehab the patient cannot back home as his sister works and he has no caregivers at home. Will pursue skilled nursing facility  placement.  Hyponatremia suspect cerebral salt wasting syndrome: Resolved discontinue salt tablets.  Diarrhea: Continue loperamide.  Resolved  Physical deconditioning in the setting of subarachnoid hemorrhage: PT and OT was consulted CIR. Awaiting inpatient rehab evaluation.  Nutrition: Continue nutrition per core track.  Poor oral intake we will resume tube feedings, will consult IR for PEG tube placement.  Leukocytosis: Likely stress margination has remained afebrile, continue to hold antibiotics.    DVT prophylaxis: scd Family Communication:none Status is: Inpatient  Remains inpatient appropriate because:Hemodynamically unstable  Dispo: The patient is from: Home              Anticipated d/c is to: Skilled nursing facility              Patient currently is not medically stable to d/c.   Difficult to place patient No    Code Status:     Code Status Orders  (From admission, onward)           Start     Ordered   05/19/21 2022  Full code  Continuous        05/19/21 2022           Code Status History     Date Active Date Inactive Code Status Order ID Comments User Context   10/31/2016 2152 11/01/2016 1945 Full Code [id number]  Clifton Custard Inpatient   10/31/2016 1308 10/31/2016 2047 Full Code [id number]  Dionne Ano ED   06/13/2013 1417 06/13/2013 2022 Full Code [id number]  Emilia Beck, PA-C ED         IV Access:   Peripheral IV   Procedures and diagnostic studies:   No results found.   Medical Consultants:   None.  Subjective:    AJANI RINEER continues to be significant lethargic, not able to feed himself.  Objective:    Vitals:   06/12/21 2249 06/13/21 0403 06/13/21 0500 06/13/21 0810  BP: 117/85 127/89  115/79  Pulse: 84 81  86  Resp: 18 18  18   Temp: 97.9 F (36.6 C) (!) 97.5 F (36.4 C)  97.6 F (36.4 C)  TempSrc: Oral Oral  Axillary  SpO2: 100% 100%  100%  Weight:   60.1 kg   Height:        SpO2: 100 % O2 Flow Rate (L/min): 2 L/min   Intake/Output Summary (Last 24 hours) at 06/13/2021 0940 Last data filed at 06/13/2021 0810 Gross per 24 hour  Intake --  Output 1200 ml  Net -1200 ml    Filed Weights   06/08/21 0500 06/09/21 0359 06/13/21 0500  Weight: 60.5 kg 60 kg 60.1 kg    Exam: General exam: In no acute distress. Respiratory system: Good air movement and clear to auscultation. Cardiovascular system: S1 & S2 heard, RRR. No JVD. Gastrointestinal system: Abdomen is nondistended, soft and nontender.  Extremities: No pedal edema. Skin: No rashes, lesions or ulcers   Data Reviewed:    Labs: Basic Metabolic Panel: Recent Labs  Lab 06/08/21 0620 06/11/21 0821  NA 144 145  K 3.9 3.9  CL 110 108  CO2 28 28  GLUCOSE 117* 116*  BUN 15 15  CREATININE 0.57* 0.64  CALCIUM 9.9 9.8    GFR Estimated Creatinine Clearance: 84.5 mL/min (by C-G formula based on SCr of 0.64 mg/dL). Liver Function Tests: No results for input(s): AST, ALT, ALKPHOS, BILITOT, PROT, ALBUMIN in the last 168 hours. No results for input(s): LIPASE, AMYLASE in the last 168 hours. No results for input(s): AMMONIA in the last 168 hours. Coagulation profile No results for input(s): INR, PROTIME in the last 168 hours. COVID-19 Labs  No results for input(s): DDIMER, FERRITIN, LDH, CRP in the last 72 hours.  Lab Results  Component Value Date   SARSCOV2NAA NEGATIVE 05/19/2021   SARSCOV2NAA NEGATIVE 05/10/2021   SARSCOV2NAA NEGATIVE 05/05/2021   SARSCOV2NAA NEGATIVE 10/24/2020    CBC: Recent Labs  Lab 06/07/21 0517 06/08/21 0620 06/11/21 0821  WBC 8.5 11.1* 6.3  NEUTROABS 6.1  --   --   HGB 12.4* 12.9* 12.5*  HCT 38.1* 39.3 39.1  MCV 101.6* 100.5* 101.3*  PLT 634* 473* 411*    Cardiac Enzymes: No results for input(s): CKTOTAL, CKMB, CKMBINDEX, TROPONINI in the last 168 hours. BNP (last 3 results) No results for input(s): PROBNP in the last 8760 hours. CBG: Recent Labs   Lab 06/12/21 1234 06/12/21 1647 06/12/21 1931 06/13/21 0001 06/13/21 0818  GLUCAP 113* 108* 132* 122* 166*    D-Dimer: No results for input(s): DDIMER in the last 72 hours. Hgb A1c: No results for input(s): HGBA1C in the last 72 hours. Lipid Profile: No results for input(s): CHOL, HDL, LDLCALC, TRIG, CHOLHDL, LDLDIRECT in the last 72 hours. Thyroid function studies: No results for input(s): TSH, T4TOTAL, T3FREE, THYROIDAB in the last 72 hours.  Invalid input(s): FREET3 Anemia work up: No results for input(s): VITAMINB12, FOLATE, FERRITIN, TIBC, IRON, RETICCTPCT in the last 72 hours. Sepsis Labs: Recent Labs  Lab 06/07/21 0517 06/08/21 0620 06/11/21 0821  WBC 8.5 11.1* 6.3    Microbiology Recent Results (from the past 240 hour(s))  Surgical PCR screen     Status: None   Collection Time: 06/08/21 11:33 AM   Specimen: Nasal  Mucosa; Nasal Swab  Result Value Ref Range Status   MRSA, PCR NEGATIVE NEGATIVE Final   Staphylococcus aureus NEGATIVE NEGATIVE Final    Comment: (NOTE) The Xpert SA Assay (FDA approved for NASAL specimens in patients 24 years of age and older), is one component of a comprehensive surveillance program. It is not intended to diagnose infection nor to guide or monitor treatment. Performed at [facility], [address], [place], [name] [address]      Medications:    chlorhexidine  15 mL Mouth Rinse BID   Chlorhexidine Gluconate Cloth  6 each Topical Daily   feeding supplement  237 mL Oral BID BM   feeding supplement (JEVITY 1.5 CAL/FIBER)  980 mL Per Tube Q24H   feeding supplement (PROSource TF)  45 mL Per Tube BID   fiber  1 packet Per Tube BID   heparin injection (subcutaneous)  5,000 Units Subcutaneous Q8H   mouth rinse  15 mL Mouth Rinse q12n4p   sodium chloride  1 g Per Tube BID WC   Continuous Infusions:      LOS: 25 days   Marinda Elk  [facility]  06/13/2021, 9:40 AM

## 2021-06-13 NOTE — Progress Notes (Signed)
IR received request for G-tube placement. Chart reviewed, could not find any comprehensive abdominal imaging studies for review. Have therefore ordered non-contrasted CT abdomen to eval anatomy for percutaneous G-tube candidacy. IR will follow up after.  Brayton El PA-C Interventional Radiology 06/13/2021 12:52 PM

## 2021-06-13 NOTE — Progress Notes (Signed)
Neurosurgery Service Progress Note  Subjective: No acute events overnight, looks better today - sitting up watching golf on TV  Objective: Vitals:   06/12/21 2249 06/13/21 0403 06/13/21 0500 06/13/21 0810  BP: 117/85 127/89  115/79  Pulse: 84 81  86  Resp: 18 18  18   Temp: 97.9 F (36.6 C) (!) 97.5 F (36.4 C)  97.6 F (36.4 C)  TempSrc: Oral Oral  Axillary  SpO2: 100% 100%  100%  Weight:   60.1 kg   Height:        Physical Exam: Awake/alert, interactive with dysarthria, Fcx4  Incisions c/d/i  Assessment & Plan: 60 y.o. ruptured Acomm s/p coiling w/ HCP s/p VPS.  -no change in neurosurgical plan of care, SNF placement pending  46  06/13/21 10:49 AM

## 2021-06-13 NOTE — Plan of Care (Signed)
  Problem: Education: Goal: Knowledge of General Education information will improve Description: Including pain rating scale, medication(s)/side effects and non-pharmacologic comfort measures Outcome: Not Progressing   Problem: Health Behavior/Discharge Planning: Goal: Ability to manage health-related needs will improve Outcome: Not Progressing   Problem: Clinical Measurements: Goal: Ability to maintain clinical measurements within normal limits will improve Outcome: Not Progressing Goal: Will remain free from infection Outcome: Not Progressing Goal: Diagnostic test results will improve Outcome: Not Progressing Goal: Respiratory complications will improve Outcome: Not Progressing Goal: Cardiovascular complication will be avoided Outcome: Not Progressing   Problem: Activity: Goal: Risk for activity intolerance will decrease Outcome: Not Progressing   Problem: Nutrition: Goal: Adequate nutrition will be maintained Outcome: Not Progressing   Problem: Coping: Goal: Level of anxiety will decrease Outcome: Not Progressing   Problem: Elimination: Goal: Will not experience complications related to bowel motility Outcome: Not Progressing Goal: Will not experience complications related to urinary retention Outcome: Not Progressing   Problem: Pain Managment: Goal: General experience of comfort will improve Outcome: Not Progressing   Problem: Safety: Goal: Ability to remain free from injury will improve Outcome: Not Progressing   Problem: Skin Integrity: Goal: Risk for impaired skin integrity will decrease Outcome: Not Progressing   Problem: Education: Goal: Knowledge of disease or condition will improve Outcome: Not Progressing Goal: Knowledge of secondary prevention will improve Outcome: Not Progressing Goal: Knowledge of patient specific risk factors addressed and post discharge goals established will improve Outcome: Not Progressing Goal: Individualized Educational  Video(s) Outcome: Not Progressing   Problem: Safety: Goal: Non-violent Restraint(s) Outcome: Not Progressing   

## 2021-06-14 ENCOUNTER — Encounter (HOSPITAL_COMMUNITY): Payer: Self-pay | Admitting: Neurosurgery

## 2021-06-14 DIAGNOSIS — I67848 Other cerebrovascular vasospasm and vasoconstriction: Secondary | ICD-10-CM | POA: Diagnosis not present

## 2021-06-14 DIAGNOSIS — Z4659 Encounter for fitting and adjustment of other gastrointestinal appliance and device: Secondary | ICD-10-CM | POA: Diagnosis not present

## 2021-06-14 DIAGNOSIS — I609 Nontraumatic subarachnoid hemorrhage, unspecified: Secondary | ICD-10-CM | POA: Diagnosis not present

## 2021-06-14 DIAGNOSIS — I608 Other nontraumatic subarachnoid hemorrhage: Secondary | ICD-10-CM | POA: Diagnosis not present

## 2021-06-14 LAB — GLUCOSE, CAPILLARY
Glucose-Capillary: 124 mg/dL — ABNORMAL HIGH (ref 70–99)
Glucose-Capillary: 125 mg/dL — ABNORMAL HIGH (ref 70–99)
Glucose-Capillary: 126 mg/dL — ABNORMAL HIGH (ref 70–99)
Glucose-Capillary: 128 mg/dL — ABNORMAL HIGH (ref 70–99)
Glucose-Capillary: 132 mg/dL — ABNORMAL HIGH (ref 70–99)

## 2021-06-14 MED ORDER — CEFAZOLIN SODIUM-DEXTROSE 2-4 GM/100ML-% IV SOLN
2.0000 g | INTRAVENOUS | Status: DC
  Filled 2021-06-14: qty 100

## 2021-06-14 NOTE — Progress Notes (Signed)
SLP Cancellation Note  Patient Details Name: [name] [name] MRN: [id number] DOB: 21-Aug-1961   Cancelled treatment:       Reason Eval/Treat Not Completed: Fatigue/lethargy limiting ability to participate   Tressie Stalker, M.S., CCC-SLP 06/14/2021, 3:24 PM

## 2021-06-14 NOTE — NC FL2 (Signed)
[place] MEDICAID FL2 LEVEL OF CARE SCREENING TOOL     IDENTIFICATION  Patient Name: [name] [name] Birthdate: 1961/12/09 Sex: male Admission Date (Current Location): 05/19/2021  [facility] and [place] Number:  Producer, television/film/video and Address:  [facility] [place], [address], [place], [name] [address]      Provider Number: [id number]  Attending Physician Name and Address:  Marinda Elk, MD  Relative Name and Phone Number:       Current Level of Care: Hospital Recommended Level of Care: Skilled Nursing Facility Prior Approval Number:    Date Approved/Denied:   PASRR Number: [id number]  Discharge Plan: SNF    Current Diagnoses: Patient Active Problem List   Diagnosis Date Noted   Cerebral salt-wasting 05/28/2021   Aneurysmal subarachnoid hemorrhage (HCC) 05/28/2021   Vasospasm of cerebral artery 05/28/2021   Diarrheal stools 05/28/2021   Substance induced mood disorder (HCC) 10/31/2016    Orientation RESPIRATION BLADDER Height & Weight     Self  Normal Incontinent Weight: 131 lb 9.8 oz (59.7 kg) Height:  5\' 9"  (175.3 cm)  BEHAVIORAL SYMPTOMS/MOOD NEUROLOGICAL BOWEL NUTRITION STATUS      Incontinent Feeding tube (Jevity 1.5)  AMBULATORY STATUS COMMUNICATION OF NEEDS Skin   Extensive Assist Non-Verbally Surgical wounds (closed incisions, abdomen and head)                       Personal Care Assistance Level of Assistance  Bathing, Feeding, Dressing Bathing Assistance: Maximum assistance Feeding assistance: Maximum assistance Dressing Assistance: Maximum assistance     Functional Limitations Info  Speech     Speech Info: Impaired (incomprehensible)    SPECIAL CARE FACTORS FREQUENCY  PT (By licensed PT), OT (By licensed OT), Speech therapy     PT Frequency: 5x/wk OT Frequency: 5x/wk     Speech Therapy Frequency: 5x/wk      Contractures Contractures Info: Not present    Additional Factors Info  Code Status,  Allergies Code Status Info: Full Allergies Info: NKA           Current Medications (06/14/2021):  This is the current hospital active medication list Current Facility-Administered Medications  Medication Dose Route Frequency Provider Last Rate Last Admin   acetaminophen (TYLENOL) tablet 650 mg  650 mg Oral Q4H PRN 06/16/2021, MD   650 mg at 05/28/21 2123   Or   acetaminophen (TYLENOL) 160 MG/5ML solution 650 mg  650 mg Per Tube Q4H PRN 2124, MD   650 mg at 06/03/21 2233   Or   acetaminophen (TYLENOL) suppository 650 mg  650 mg Rectal Q4H PRN 2234, MD       [START ON 06/15/2021] ceFAZolin (ANCEF) IVPB 2g/100 mL premix  2 g Intravenous to XRAY Han, Aimee H, PA-C       chlorhexidine (PERIDEX) 0.12 % solution 15 mL  15 mL Mouth Rinse BID 06/17/2021, MD   15 mL at 06/14/21 0912   Chlorhexidine Gluconate Cloth 2 % PADS 6 each  6 each Topical Daily 06/16/21, MD   6 each at 06/14/21 0914   feeding supplement (ENSURE ENLIVE / ENSURE PLUS) liquid 237 mL  237 mL Oral BID BM 06/16/21, MD       feeding supplement (JEVITY 1.5 CAL/FIBER) liquid 1,000 mL  1,000 mL Per Tube Continuous Marinda Elk, MD 60 mL/hr at 06/14/21 0809 1,000 mL at 06/14/21 0809  feeding supplement (PROSource TF) liquid 45 mL  45 mL Per Tube BID Marinda Elk, MD   45 mL at 06/14/21 0914   fiber (NUTRISOURCE FIBER) 1 packet  1 packet Per Tube BID Marinda Elk, MD   1 packet at 06/14/21 0913   free water 100 mL  100 mL Per Tube Q8H Marinda Elk, MD   100 mL at 06/14/21 0529   heparin injection 5,000 Units  5,000 Units Subcutaneous Q8H Marinda Elk, MD   5,000 Units at 06/14/21 1448   HYDROmorphone (DILAUDID) injection 0.25 mg  0.25 mg Intravenous Q2H PRN Marinda Elk, MD       loperamide HCl (IMODIUM) 1 MG/7.5ML suspension 2 mg  2 mg Per Tube PRN Marinda Elk, MD   2 mg at 06/09/21 2147   MEDLINE mouth  rinse  15 mL Mouth Rinse q12n4p Marinda Elk, MD   15 mL at 06/13/21 1216   ondansetron (ZOFRAN-ODT) disintegrating tablet 4 mg  4 mg Oral Q6H PRN Marinda Elk, MD       Or   ondansetron [facility]) injection 4 mg  4 mg Intravenous Q6H PRN Marinda Elk, MD       sodium chloride flush (NS) 0.9 % injection 10-40 mL  10-40 mL Intracatheter PRN Marinda Elk, MD   10 mL at 05/31/21 2048   sodium chloride tablet 1 g  1 g Per Tube BID WC Marinda Elk, MD   1 g at 06/14/21 0913     Discharge Medications: Please see discharge summary for a list of discharge medications.  Relevant Imaging Results:  Relevant Lab Results:   Additional Information SS#: [id number]  Baldemar Lenis, LCSW

## 2021-06-14 NOTE — Progress Notes (Signed)
[facility] PROGRESS NOTE    Progress Note  [name] [name]  PJK:[id number] DOB: 01/17/1961 DOA: 05/19/2021 PCP: Pcp, No     Brief Narrative:   [name] [name] is an 60 y.o. male past medical history past medical history of alcohol abuse and drug abuse who suffered a subarachnoid hemorrhage status post coiling of the anterior communicating aneurysm, underwent VP shunting on 06/09/2019  Significant Events: 5/25 Admitted with [facility] secondary to ruptured aneurysm, ventriculostomy preformed in ED, Ancef started 5/26 Coil Embolization 5/29 worsening neurological exam consistent with vasospasm, hemodynamic augmentation initiated. 6/1 worsening hyponatremia: Na of 120.  Patient started on salt tablets 1 g twice daily and fludrocortisone 0.1 mg. 6/2 BP goal reduced 160 -180.  Salt Tablet increased to 1 g tid. Fludrocortisone increased 0.2 mg. 6/8 Ongoing agitation, intermittently following commands. CT Head with worsening hydrocephalus despite EVD, open to 10 per NSGY. 6/9 EVD remains in place, repeat head CT as below 6/10-12, no significant neuro changes, intermittently agitated.  Ongoing EVD, plans for VP shunt next week 6/13 no neuro changes, remains intermittently restless 6/14 went for VP shunt, no neuro changes, afebrile  Awaiting skilled nursing facility placement.  Assessment/Plan:   Acute metabolic encephalopathy subarachnoid hemorrhage secondary to aneurysm rupture: With obstructive hydrocephalus status post EVD on 06/08/2021. Core track in place continue nutrition per NG tube remains in the dysphagia 1 diet.  He does not want anything to eat when they try to feed him. Continue seizure precautions and aspiration precautions. Physical therapy evaluated the patient and recommended inpatient rehab the patient cannot back home as his sister works and he has no caregivers at home. Will pursue skilled nursing facility placement. For PEG tube placement by IR.  Hyponatremia suspect  cerebral salt wasting syndrome: Resolved discontinue salt tablets.  Diarrhea: Continue loperamide.  Resolved  Physical deconditioning in the setting of subarachnoid hemorrhage: PT and OT was consulted CIR. Will need skilled nursing facility placement.  Nutrition: Continue nutrition per core track.  PEG tube placement by IR  Leukocytosis: Likely stress margination has remained afebrile, continue to hold antibiotics.    DVT prophylaxis: scd Family Communication:none Status is: Inpatient  Remains inpatient appropriate because:Hemodynamically unstable  Dispo: The patient is from: Home              Anticipated d/c is to: Skilled nursing facility              Patient currently is not medically stable to d/c.   Difficult to place patient No    Code Status:     Code Status Orders  (From admission, onward)           Start     Ordered   05/19/21 2022  Full code  Continuous        05/19/21 2022           Code Status History     Date Active Date Inactive Code Status Order ID Comments User Context   10/31/2016 2152 11/01/2016 1945 Full Code [id number]  Clifton Custard Inpatient   10/31/2016 1308 10/31/2016 2047 Full Code [id number]  Dionne Ano ED   06/13/2013 1417 06/13/2013 2022 Full Code [id number]  Emilia Beck, PA-C ED         IV Access:   Peripheral IV   Procedures and diagnostic studies:   CT ABDOMEN WO CONTRAST  Result Date: 06/14/2021 CLINICAL DATA:  Evaluate anatomy for percutaneous gastrostomy tube placement. History of intracranial aneurysm and hemorrhage. EXAM: CT  ABDOMEN WITHOUT CONTRAST TECHNIQUE: Multidetector CT imaging of the abdomen was performed following the standard protocol without IV contrast. COMPARISON:  None. FINDINGS: Lower chest: Few dependent densities at the right lung base. Otherwise, the lung bases are clear. There appears to be a small hiatal hernia. Hepatobiliary: Normal appearance of the liver and  gallbladder. No significant biliary dilatation. Pancreas: Unremarkable. No pancreatic ductal dilatation or surrounding inflammatory changes. Spleen: Normal in size without focal abnormality. Adrenals/Urinary Tract: Normal appearance of the adrenal glands. Normal appearance of both kidneys without stones or hydronephrosis. Stomach/Bowel: Nasogastric tube extends in the stomach and terminates near the duodenal bulb. The stomach is cephalad to the transverse colon. No significant bowel dilatation. No focal bowel inflammation. Vascular/Lymphatic: Atherosclerotic calcifications in the abdominal aorta without aneurysm. No significant lymph node enlargement in the abdomen. Other: VP shunt enters through the anterior right upper abdomen. There is a small amount of gas around the shunt tubing as it enters the abdominal cavity. There is also free air within the anterior upper abdomen likely secondary to the shunt placement. The shunt tubing coils around the right side of the abdomen and terminates in the mid upper abdomen adjacent to the mid transverse colon. No significant free fluid in the abdomen. Areas of subcutaneous edema in the anterior abdomen could be related to injection sites. Musculoskeletal: Sclerosis involving the anterior and mid L2 vertebral body is likely related to degenerative changes. No suspicious bone findings. IMPRESSION: 1. Stomach is located in the anterior upper abdomen and anatomy should be amendable for percutaneous gastrostomy tube placement. 2. Pneumoperitoneum which is likely secondary to recent placement of VP shunt. 3. Probable small hiatal hernia. Electronically Signed   By: Richarda Overlie M.D.   On: 06/14/2021 08:09     Medical Consultants:   None.   Subjective:    [name] [name] lethargic nonverbal  Objective:    Vitals:   06/13/21 1614 06/13/21 1944 06/14/21 0030 06/14/21 0413  BP: 131/89 (!) 120/91 136/90 127/88  Pulse: 85 83 84 78  Resp: 17 16 16 16   Temp: 98.1 F  (36.7 C) 98.2 F (36.8 C) 98.3 F (36.8 C) 97.7 F (36.5 C)  TempSrc:  Oral Oral Oral  SpO2: 100% 100% 96% 97%  Weight:    59.7 kg  Height:       SpO2: 97 % O2 Flow Rate (L/min): 2 L/min   Intake/Output Summary (Last 24 hours) at 06/14/2021 1031 Last data filed at 06/13/2021 1720 Gross per 24 hour  Intake --  Output 400 ml  Net -400 ml    Filed Weights   06/09/21 0359 06/13/21 0500 06/14/21 0413  Weight: 60 kg 60.1 kg 59.7 kg    Exam: General exam: In no acute distress. Respiratory system: Good air movement and clear to auscultation. Cardiovascular system: S1 & S2 heard, RRR. No JVD. Gastrointestinal system: Abdomen is nondistended, soft and nontender.  Extremities: No pedal edema. Skin: No rashes, lesions or ulcers   Data Reviewed:    Labs: Basic Metabolic Panel: Recent Labs  Lab 06/08/21 0620 06/11/21 0821 06/13/21 1009  NA 144 145 143  K 3.9 3.9 3.8  CL 110 108 108  CO2 28 28 29   GLUCOSE 117* 116* 102*  BUN 15 15 16   CREATININE 0.57* 0.64 0.58*  CALCIUM 9.9 9.8 9.3    GFR Estimated Creatinine Clearance: 84 mL/min (A) (by C-G formula based on SCr of 0.58 mg/dL (L)). Liver Function Tests: No results for input(s): AST, ALT, ALKPHOS, BILITOT,  PROT, ALBUMIN in the last 168 hours. No results for input(s): LIPASE, AMYLASE in the last 168 hours. No results for input(s): AMMONIA in the last 168 hours. Coagulation profile No results for input(s): INR, PROTIME in the last 168 hours. COVID-19 Labs  No results for input(s): DDIMER, FERRITIN, LDH, CRP in the last 72 hours.  Lab Results  Component Value Date   SARSCOV2NAA NEGATIVE 05/19/2021   SARSCOV2NAA NEGATIVE 05/10/2021   SARSCOV2NAA NEGATIVE 05/05/2021   SARSCOV2NAA NEGATIVE 10/24/2020    CBC: Recent Labs  Lab 06/08/21 0620 06/11/21 0821 06/13/21 1009  WBC 11.1* 6.3 6.2  NEUTROABS  --   --  4.1  HGB 12.9* 12.5* 12.6*  HCT 39.3 39.1 37.7*  MCV 100.5* 101.3* 99.7  PLT 473* 411* 344     Cardiac Enzymes: No results for input(s): CKTOTAL, CKMB, CKMBINDEX, TROPONINI in the last 168 hours. BNP (last 3 results) No results for input(s): PROBNP in the last 8760 hours. CBG: Recent Labs  Lab 06/13/21 0818 06/13/21 1212 06/13/21 1617 06/13/21 2009 06/14/21 0406  GLUCAP 166* 104* 100* 112* 128*    D-Dimer: No results for input(s): DDIMER in the last 72 hours. Hgb A1c: No results for input(s): HGBA1C in the last 72 hours. Lipid Profile: No results for input(s): CHOL, HDL, LDLCALC, TRIG, CHOLHDL, LDLDIRECT in the last 72 hours. Thyroid function studies: No results for input(s): TSH, T4TOTAL, T3FREE, THYROIDAB in the last 72 hours.  Invalid input(s): FREET3 Anemia work up: No results for input(s): VITAMINB12, FOLATE, FERRITIN, TIBC, IRON, RETICCTPCT in the last 72 hours. Sepsis Labs: Recent Labs  Lab 06/08/21 0620 06/11/21 0821 06/13/21 1009  WBC 11.1* 6.3 6.2    Microbiology Recent Results (from the past 240 hour(s))  Surgical PCR screen     Status: None   Collection Time: 06/08/21 11:33 AM   Specimen: Nasal Mucosa; Nasal Swab  Result Value Ref Range Status   MRSA, PCR NEGATIVE NEGATIVE Final   Staphylococcus aureus NEGATIVE NEGATIVE Final    Comment: (NOTE) The Xpert SA Assay (FDA approved for NASAL specimens in patients 73 years of age and older), is one component of a comprehensive surveillance program. It is not intended to diagnose infection nor to guide or monitor treatment. Performed at [facility], [address] [place], [name] [address]      Medications:    chlorhexidine  15 mL Mouth Rinse BID   Chlorhexidine Gluconate Cloth  6 each Topical Daily   feeding supplement  237 mL Oral BID BM   feeding supplement (PROSource TF)  45 mL Per Tube BID   fiber  1 packet Per Tube BID   free water  100 mL Per Tube Q8H   heparin injection (subcutaneous)  5,000 Units Subcutaneous Q8H   mouth rinse  15 mL Mouth Rinse q12n4p   sodium  chloride  1 g Per Tube BID WC   Continuous Infusions:  [START ON 06/15/2021]  ceFAZolin (ANCEF) IV     feeding supplement (JEVITY 1.5 CAL/FIBER) 1,000 mL (06/14/21 0809)       LOS: 26 days   Marinda Elk  [facility]  06/14/2021, 10:31 AM

## 2021-06-14 NOTE — Progress Notes (Signed)
  NEUROSURGERY PROGRESS NOTE   No issues overnight.   EXAM:  BP 127/88   Pulse 78   Temp 97.7 F (36.5 C) (Oral)   Resp 16   Ht 5\' 9"  (1.753 m)   Wt 59.7 kg   SpO2 97%   BMI 19.44 kg/m   Awake, alert Occasionally speaking Intermittently FC MAE well Wounds c/d/i  IMPRESSION:  60 y.o. male SAH d#27 s/p Acom coiling, POD#6 s/p right VPS. Remains stable Poor PO intake  PLAN: - SNF placement - Cont PT/OT while here - Will likely need G-tube   46, MD [facility]

## 2021-06-14 NOTE — Progress Notes (Signed)
Physical Therapy Treatment Patient Details Name: [name] [name] MRN: [id number] DOB: Oct 25, 1961 Today's Date: 06/14/2021    History of Present Illness 60 y.o. M who was admitted to ICU 05/19/21 with IVH with large acute SAH now s/p ventriculostomy. 5/26 coil embolization. 5/29 worsening neurological exam consistent with vasospasm. VP shunt placement 6/14. Significant PMH: rotator cuff tear, tobacco, marijuana and ETOH use, remote hx crack cocaine use.    PT Comments    Pt received in bed, soiled with BM and urine. Assisted OOB for nursing to change bed linens. Pt required +2 max assist supine <> sit, and +2 max assist squat pivot transfer bed <> chair. Zero sitting balance EOB with pt demonstrating heavy posterior lean/push. Pt supine in bed at end of session.    Follow Up Recommendations  SNF     Equipment Recommendations  Wheelchair (measurements PT);Wheelchair cushion (measurements PT);Hospital bed;3in1 (PT);Other (comment)    Recommendations for Other Services       Precautions / Restrictions Precautions Precautions: Fall;Other (comment) Precaution Comments: safety mitts, cortrak Restrictions Weight Bearing Restrictions: No    Mobility  Bed Mobility Overal bed mobility: Needs Assistance Bed Mobility: Supine to Sit;Sit to Supine;Rolling Rolling: Mod assist   Supine to sit: Max assist;HOB elevated Sit to supine: +2 for physical assistance;Max assist   General bed mobility comments: no initiation noted. Assist with all aspects of mobility.    Transfers Overall transfer level: Needs assistance   Transfers: Squat Pivot Transfers     Squat pivot transfers: +2 physical assistance;Max assist     General transfer comment: pivot transfer bed <>chair. Knees blocked. Use of bed pad to lift hips.  Ambulation/Gait                 Stairs             Wheelchair Mobility    Modified Rankin (Stroke Patients Only) Modified Rankin (Stroke Patients  Only) Pre-Morbid Rankin Score: No symptoms Modified Rankin: Moderately severe disability     Balance Overall balance assessment: Needs assistance Sitting-balance support: Feet supported;No upper extremity supported Sitting balance-Leahy Scale: Zero Sitting balance - Comments: heavy push into trunk extension Postural control: Posterior lean Standing balance support: Bilateral upper extremity supported;During functional activity Standing balance-Leahy Scale: Zero                              Cognition Arousal/Alertness: Awake/alert Behavior During Therapy: Flat affect;Impulsive Overall Cognitive Status: Impaired/Different from baseline Area of Impairment: Orientation;Attention;Memory;Following commands;Safety/judgement;Awareness;Problem solving                 Orientation Level: Disoriented to;Place;Time;Situation Current Attention Level: Focused Memory: Decreased short-term memory Following Commands: Follows one step commands inconsistently;Follows one step commands with increased time Safety/Judgement: Decreased awareness of safety;Decreased awareness of deficits Awareness: Intellectual Problem Solving: Decreased initiation;Difficulty sequencing;Requires verbal cues;Requires tactile cues;Slow processing        Exercises      General Comments General comments (skin integrity, edema, etc.): Pt soiled on arrival. Assisted OOB for nursing to change bed linens.      Pertinent Vitals/Pain Pain Assessment: Faces Faces Pain Scale: Hurts a little bit Pain Location: generalized Pain Descriptors / Indicators: Grimacing Pain Intervention(s): Monitored during session;Repositioned    Home Living                      Prior Function  PT Goals (current goals can now be found in the care plan section) Acute Rehab PT Goals Patient Stated Goal: unable to state PT Goal Formulation: With patient/family Time For Goal Achievement:  06/28/21 Potential to Achieve Goals: Good Progress towards PT goals: Progressing toward goals    Frequency    Min 3X/week      PT Plan Current plan remains appropriate    Co-evaluation              AM-PAC PT "6 Clicks" Mobility   Outcome Measure  Help needed turning from your back to your side while in a flat bed without using bedrails?: A Lot Help needed moving from lying on your back to sitting on the side of a flat bed without using bedrails?: A Lot Help needed moving to and from a bed to a chair (including a wheelchair)?: Total Help needed standing up from a chair using your arms (e.g., wheelchair or bedside chair)?: A Lot Help needed to walk in hospital room?: Total Help needed climbing 3-5 steps with a railing? : Total 6 Click Score: 9    End of Session Equipment Utilized During Treatment: Gait belt Activity Tolerance: Patient tolerated treatment well Patient left: in bed;with call bell/phone within reach;with bed alarm set Nurse Communication: Mobility status PT Visit Diagnosis: Unsteadiness on feet (R26.81);Muscle weakness (generalized) (M62.81);Difficulty in walking, not elsewhere classified (R26.2);Other abnormalities of gait and mobility (R26.89)     Time: 5397-6734 PT Time Calculation (min) (ACUTE ONLY): 27 min  Charges:  $Therapeutic Activity: 23-37 mins                     Aida Raider, PT  Office # [phone] Pager [phone]    Ilda Foil 06/14/2021, 12:43 PM

## 2021-06-14 NOTE — Progress Notes (Signed)
Pt's 0000 CBG not collected d/t staffing issues and other interventions. No harm done to pt.

## 2021-06-14 NOTE — Plan of Care (Signed)
  Problem: Education: Goal: Knowledge of disease or condition will improve Outcome: Progressing Goal: Knowledge of secondary prevention will improve Outcome: Progressing Goal: Knowledge of patient specific risk factors addressed and post discharge goals established will improve Outcome: Progressing Goal: Individualized Educational Video(s) Outcome: Progressing   

## 2021-06-14 NOTE — H&P (Addendum)
Chief Complaint: Patient was seen in consultation today for image guided gastrostomy tube placement  Chief Complaint  Patient presents with   Altered Mental Status   at the request of Dr. David Stall, A.   Referring Physician(s): Dr. David Stall, A.   Supervising Physician: Gilmer Mor  Patient Status: [facility] - In-pt  History of Present Illness: [name] [name] is a 60 y.o. 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 Conchita Paris on 05/20/2021, complicated by hydrocephalus s/p VP shunt placement with Dr. Conchita Paris on 06/08/21. Patient has been admitted since 05/19/2021 and remains confused with no significant neurological improvement.  Patient has been receiving nutrition per NG tube and reminds in the dysphagia 1 diet.  Due to patient's poor p.o. intake, a feeding tube placement was recommended to the patient for his family members.  After thorough discussion and shared decision making, patient's family decided to proceed with the feeding tube placement.  I was requested for an image guided gastrostomy tube placement.  Patient laying in bed, not in acute distress appears confused.   Has soft mittens on. Alert and oriented to self only.  ROS was not discussed.  Past Medical History:  Diagnosis Date   Alcohol abuse    Drug abuse (HCC)    Right rotator cuff tear    Seasonal allergies     Past Surgical History:  Procedure Laterality Date   AMPUTATION Left 05/30/2019   Procedure: REVISION AMPUTATION OF L INDEX FINGER;  Surgeon: Betha Loa, MD;  Location: [facility] OR;  Service: Orthopedics;  Laterality: Left;   ANEURYSM COILING  05/19/2021   Anterior communicating aneurysm   IR ANGIO INTRA EXTRACRAN SEL INTERNAL CAROTID UNI R MOD SED  05/20/2021   IR ANGIOGRAM FOLLOW UP STUDY  05/20/2021   IR TRANSCATH/EMBOLIZ  05/20/2021   LAPAROSCOPIC REVISION VENTRICULAR-PERITONEAL (V-P) SHUNT  Right 06/08/2021   Procedure: LAPAROSCOPIC ASSISTED VENTRICULAR-PERITONEAL (V-P) SHUNT;  Surgeon: Violeta Gelinas, MD;  Location: [facility] OR;  Service: General;  Laterality: Right;   RADIOLOGY WITH ANESTHESIA N/A 05/20/2021   Procedure: IR WITH ANESTHESIA;  Surgeon: Lisbeth Renshaw, MD;  Location: [name] OR;  Service: Radiology;  Laterality: N/A;   SHOULDER ARTHROSCOPY WITH OPEN ROTATOR CUFF REPAIR AND DISTAL CLAVICLE ACROMINECTOMY Right 07/03/2020   Procedure: SHOULDER ARTHROSCOPY WITH OPEN ROTATOR CUFF REPAIR AND DISTAL CLAVICLE ARTHROSCOPIC DEBRIDEMENT;  Surgeon: Frederico Hamman, MD;  Location: [facility];  Service: Orthopedics;  Laterality: Right;   SHOULDER CLOSED REDUCTION Right 10/28/2020   Procedure: CLOSED MANIPULATION SHOULDER;  Surgeon: Frederico Hamman, MD;  Location: [facility];  Service: Orthopedics;  Laterality: Right;   toe amputaion Left yrs ago   VENTRICULOPERITONEAL SHUNT N/A 06/08/2021   Procedure: SHUNT INSERTION VENTRICULAR-PERITONEAL;  Surgeon: Lisbeth Renshaw, MD;  Location: [facility] OR;  Service: Neurosurgery;  Laterality: N/A;    Allergies: Patient has no known allergies.  Medications: Prior to Admission medications   Medication Sig Start Date End Date Taking? Authorizing Provider  etodolac (LODINE) 500 MG tablet Take 500 mg by mouth 2 (two) times daily.   Yes [provider]  [employer] ALLERGY RELIEF-D 5-120 MG tablet Take 1 tablet by mouth 2 (two) times daily. 05/04/21   [provider]  doxycycline (VIBRAMYCIN) 100 MG capsule Take 1 capsule (100 mg total) by mouth 2 (two) times daily. One po bid x 7 days 05/05/21   Zadie Rhine, MD  predniSONE (DELTASONE) 20  MG tablet Take 20 mg by mouth daily. 05/04/21   [provider]  triamcinolone cream (KENALOG) 0.5 % Apply 1 application topically in the morning and at bedtime. 02/27/21   [provider]     History reviewed. No pertinent family history.  Social History    Socioeconomic History   Marital status: Single    Spouse name: Not on file   Number of children: Not on file   Years of education: Not on file   Highest education level: Not on file  Occupational History   Not on file  Tobacco Use   Smoking status: Every Day    Packs/day: 0.25    Years: 30.00    Pack years: 7.50    Types: Cigarettes   Smokeless tobacco: Never  Vaping Use   Vaping Use: Never used  Substance and Sexual Activity   Alcohol use: Yes    Alcohol/week: 3.0 standard drinks    Types: 3 Cans of beer per week    Comment: daily 3-5 beers per day, sometimes none   Drug use: Yes    Types: Marijuana, "Crack" cocaine    Comment: former cocaine/crack last used yrs ago, marijuana last used 04/2021   Sexual activity: Not on file  Other Topics Concern   Not on file  Social History Narrative   Not on file   Social Determinants of Health   Financial Resource Strain: Not on file  Food Insecurity: Not on file  Transportation Needs: Not on file  Physical Activity: Not on file  Stress: Not on file  Social Connections: Not on file     Review of Systems: Patient confused, ROS not discussed.  Vital Signs: BP 112/82 (BP Location: Left Arm)   Pulse 85   Temp 98 F (36.7 C) (Oral)   Resp 16   Ht 5\' 9"  (1.753 m)   Wt 131 lb 9.8 oz (59.7 kg)   SpO2 99%   BMI 19.44 kg/m   Physical Exam Vitals reviewed.  Constitutional:      General: He is not in acute distress.    Appearance: He is ill-appearing.  HENT:     Head:     Comments: Surgical staples on right frontal     Nose:     Comments: NG tube in left nostril  Cardiovascular:     Rate and Rhythm: Normal rate and regular rhythm.     Pulses: Normal pulses.     Heart sounds: Normal heart sounds.  Pulmonary:     Effort: Pulmonary effort is normal.     Breath sounds: Normal breath sounds.  Abdominal:     General: Abdomen is flat. Bowel sounds are normal.     Palpations: Abdomen is soft.  Skin:    General: Skin is  warm and dry.     Coloration: Skin is not jaundiced or pale.  Neurological:     Mental Status: He is disoriented.    MD Evaluation Airway: WNL Heart: WNL Abdomen: WNL Chest/ Lungs: WNL ASA  Classification: 3 Mallampati/Airway Score: Two (Patient not able to follow command, Mallampati assessment limited.)  Imaging: CT ABDOMEN WO CONTRAST  Result Date: 06/14/2021 CLINICAL DATA:  Evaluate anatomy for percutaneous gastrostomy tube placement. History of intracranial aneurysm and hemorrhage. EXAM: CT ABDOMEN WITHOUT CONTRAST TECHNIQUE: Multidetector CT imaging of the abdomen was performed following the standard protocol without IV contrast. COMPARISON:  None. FINDINGS: Lower chest: Few dependent densities at the right lung base. Otherwise, the lung bases are clear. There  appears to be a small hiatal hernia. Hepatobiliary: Normal appearance of the liver and gallbladder. No significant biliary dilatation. Pancreas: Unremarkable. No pancreatic ductal dilatation or surrounding inflammatory changes. Spleen: Normal in size without focal abnormality. Adrenals/Urinary Tract: Normal appearance of the adrenal glands. Normal appearance of both kidneys without stones or hydronephrosis. Stomach/Bowel: Nasogastric tube extends in the stomach and terminates near the duodenal bulb. The stomach is cephalad to the transverse colon. No significant bowel dilatation. No focal bowel inflammation. Vascular/Lymphatic: Atherosclerotic calcifications in the abdominal aorta without aneurysm. No significant lymph node enlargement in the abdomen. Other: VP shunt enters through the anterior right upper abdomen. There is a small amount of gas around the shunt tubing as it enters the abdominal cavity. There is also free air within the anterior upper abdomen likely secondary to the shunt placement. The shunt tubing coils around the right side of the abdomen and terminates in the mid upper abdomen adjacent to the mid transverse colon. No  significant free fluid in the abdomen. Areas of subcutaneous edema in the anterior abdomen could be related to injection sites. Musculoskeletal: Sclerosis involving the anterior and mid L2 vertebral body is likely related to degenerative changes. No suspicious bone findings. IMPRESSION: 1. Stomach is located in the anterior upper abdomen and anatomy should be amendable for percutaneous gastrostomy tube placement. 2. Pneumoperitoneum which is likely secondary to recent placement of VP shunt. 3. Probable small hiatal hernia. Electronically Signed   By: Richarda OverlieAdam  Henn M.D.   On: 06/14/2021 08:09   CT ANGIO HEAD W OR WO CONTRAST  Result Date: 05/23/2021 CLINICAL DATA:  Neuro deficit, acute stroke suspected. Aneurysmal subarachnoid hemorrhage with suspected symptomatic vasospasm. EXAM: CT ANGIOGRAPHY HEAD TECHNIQUE: Multidetector CT imaging of the head was performed using the standard protocol during bolus administration of intravenous contrast. Multiplanar CT image reconstructions and MIPs were obtained to evaluate the vascular anatomy. CONTRAST:  51mL OMNIPAQUE IOHEXOL 350 MG/ML SOLN COMPARISON:  CT head May 20, 2021.  CTA May 19, 2021. FINDINGS: CT HEAD Brain: A right frontal ventriculostomy catheter with tip in similar position. Tube there is similar volume of intraventricular hemorrhage i with similar ventriculomegaly. Similar intraparenchymal hemorrhage in the left greater than right inferior frontal lobes, likely related to anterior communicating artery rupture. There is similar surrounding edema and subarachnoid hemorrhage. Additional areas of convexity subarachnoid hemorrhage have slightly decreased. No evidence of interval acute large vascular territory infarct. No progressive mass effect. . Vascular: See below. Skull: No acute fracture. Sinuses: Visualized sinuses are clear. Other: No mastoid effusions. CTA HEAD Anterior circulation: Bilateral ICAs are patent. Similar mild calcific atherosclerosis of the  paraclinoid ICAs. Status post anterior communicating artery aneurysm coiling. Streak artifact largely obscures this region, including adjacent distal A1 and proximal A2 ACAs and the paraclinoid ICAs. The visualized MCAs and ACAs are patent. There is new fairly diffuse multifocal moderate irregularity and narrowing of the MCAs and ACAs. Posterior circulation: The intradural vertebral arteries are patent with mild narrowing. Patent basilar artery and bilateral posterior cerebral arteries. Venous sinuses: As permitted by contrast timing, patent. Review of the MIP images confirms the above findings. IMPRESSION: CT head: 1. Similar intraparenchymal and intraventricular hemorrhage in this patient with suspected prior anterior communicating artery rupture. No progressive ventriculomegaly or mass effect. Slightly improved convexity subarachnoid hemorrhage. 2. No evidence of acute interval large vascular territory infarct. MRI could provide more sensitive evaluation for acute infarct if clinically indicated. CTA 1. Multifocal moderate irregularity and narrowing of patent MCAs and ACAs bilaterally  that is best appreciated on MIP imaging and compatible with vasospasm. Catheter arteriogram could further characterize if clinically indicated. 2. Status post anterior communicating artery aneurysm coiling with streak artifact obscuring this area. Electronically Signed   By: Feliberto Harts MD   On: 05/23/2021 18:13   CT Angio Head W or Wo Contrast  Result Date: 05/19/2021 CLINICAL DATA:  Patient found down.  Intracranial hemorrhage. EXAM: CT ANGIOGRAPHY HEAD AND NECK TECHNIQUE: Multidetector CT imaging of the head and neck was performed using the standard protocol during bolus administration of intravenous contrast. Multiplanar CT image reconstructions and MIPs were obtained to evaluate the vascular anatomy. Carotid stenosis measurements (when applicable) are obtained utilizing NASCET criteria, using the distal internal  carotid diameter as the denominator. CONTRAST:  50mL OMNIPAQUE IOHEXOL 350 MG/ML SOLN COMPARISON:  Noncontrast head CT performed earlier today 05/19/2021. FINDINGS: CTA NECK FINDINGS Aortic arch: Standard aortic branching. The visualized aortic arch is normal in caliber. No hemodynamically significant innominate or proximal subclavian artery stenosis. Right carotid system: CCA and ICA patent within the neck without stenosis. No significant atherosclerotic disease. Left carotid system: CCA and ICA patent within the neck. Minimal soft and calcified plaque within the proximal left ICA results in less than 50% stenosis. Vertebral arteries: Codominant and patent within the neck without stenosis. Skeleton: Cervic advanced cervical spondylosis. Please refer to same day noncontrast CT of the cervical spine for a complete description of cervical spine findings. Other neck: No neck mass or cervical lymphadenopathy. Thyroid unremarkable. Upper chest: No consolidation within the imaged lung apices. Review of the MIP images confirms the above findings CTA HEAD FINDINGS Evaluation is somewhat limited due to suboptimal arterial contrast enhancement. Anterior circulation: The intracranial internal carotid arteries are patent. Mild nonstenotic calcified plaque within the left cavernous and paraclinoid segments. The M1 middle cerebral arteries are patent. No M2 proximal branch occlusion or high-grade proximal M2 stenosis is identified. The anterior cerebral arteries are patent. The anterior cerebral arteries are patent. 3 mm aneurysm arising from the anterior communicating artery complex (series 7, image 118) (series 8, image 102). Posterior circulation: The intracranial vertebral arteries are patent. The basilar artery is patent. The posterior cerebral arteries are patent. Posterior communicating arteries are hypoplastic or absent bilaterally. Venous sinuses: Within the limitations of contrast timing, no convincing thrombus.  Anatomic variants: As described Review of the MIP images confirms the above findings These results were called by telephone at the time of interpretation on 05/19/2021 at 7:58 pm to provider Lorre Nick , who verbally acknowledged these results. IMPRESSION: CTA neck: The common carotid, internal carotid and vertebral arteries are patent within the neck without hemodynamically significant stenosis. Minimal soft and calcified plaque within the proximal left ICA. CTA head: 1. Somewhat limited examination due to suboptimal arterial enhancement. 2. 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 3. No intracranial large vessel occlusion or proximal high-grade arterial stenosis. Electronically Signed   By: Jackey Loge DO   On: 05/19/2021 19:59   CT HEAD WO CONTRAST  Addendum Date: 06/02/2021   ADDENDUM REPORT: 06/02/2021 11:46 ADDENDUM: Findings and recommendations discussed with Dr. Merrily Pew via telephone at 11:42 a.m. Electronically Signed   By: Feliberto Harts MD   On: 06/02/2021 11:46   Result Date: 06/02/2021 CLINICAL DATA:  Hydrocephalus. EXAM: CT HEAD WITHOUT CONTRAST TECHNIQUE: Contiguous axial images were obtained from the base of the skull through the vertex without intravenous contrast. COMPARISON:  CT May 23, 2020. FINDINGS: Brain: Significantly increased ventriculomegaly, compatible with worsening hydrocephalus. Right frontal approach ventriculostomy catheter with the tip at the right lateral ventricle near the foramen of Monro, similar to prior. Visualized portions of the shunt catheter appear intact. Small (7 mm) focus of hyperdense hemorrhage in the left pericallosal frontal lobe (series 3, image 19 and series 6, image 38) is concerning for a new/interval acute hemorrhage given no definite hyperdense hemorrhage at this site on the prior, although a direct comparison is limited given evolving blood products.  Otherwise, decreased size and density of intraparenchymal hemorrhage in the inferior left frontal lobe and corpus callosum. Hypodensity in these areas likely represents edema and developing encephalomalacia. Small volume of surrounding subarachnoid hemorrhage, decreased. No evidence of interval acute large vascular territory infarct. Decreased layering intraventricular hemorrhage. Significantly increased size of the lateral and third ventricles, compatible with worsening hydrocephalus. There is rounding of the temporal horns and outward convexity of the third ventricle. Vascular: No hyperdense vessel identified. Sequela of left anterior communicating artery aneurysm coiling. Surrounding streak artifact limits evaluation in this region. Skull: Right frontal burr hole.  No acute fracture. Sinuses/Orbits: Visualized sinuses are clear. Other: No mastoid effusions. IMPRESSION: 1. Significantly increased ventriculomegaly, compatible with worsening hydrocephalus. 2. Small (7 mm) focus of hyperdense hemorrhage in the left pericallosal frontal lobe is concerning for a new/interval site of acute hemorrhage given no definite hyperdense hemorrhage at this site on the prior, although a direct comparison is limited given evolving blood products. Recommend attention on close interval follow-up. 3. Otherwise, improving intraparenchymal, subarachnoid, and intraventricular hemorrhage with edema and suspected developing encephalomalacia in the inferior left frontal lobe and corpus callosum. Electronically Signed: By: Feliberto Harts MD On: 06/02/2021 11:33   CT HEAD WO CONTRAST  Result Date: 05/20/2021 CLINICAL DATA:  Subarachnoid hemorrhage, altered mental status, follow-up examination EXAM: CT HEAD WITHOUT CONTRAST TECHNIQUE: Contiguous axial images were obtained from the base of the skull through the vertex without intravenous contrast. COMPARISON:  CTA and head CT 05/19/2021 FINDINGS: Brain: Interval right frontal  ventriculostomy has been placed with its tip in the region of the foramina [place]. Intraparenchymal hematoma within the a periventricular left frontal lobe anteriorly with extensive hemorrhage layering upon the corpus callosum, and extending into the ventricles bilaterally is again seen and appears stable since prior examination mild subarachnoid hemorrhage within the para falcine sulci of the vertex, sylvian fissures and within the suprasellar cistern appears stable. Interval coil embolization of an expected anterior communicating artery aneurysm. Stable borderline ventriculomegaly. No acute infarct. No significant mass effect or midline shift. Cerebellum is unremarkable. Vascular: No asymmetric hyperdense vasculature. Skull: No acute fracture. Sinuses/Orbits: Visualized orbits are unremarkable. The paranasal sinuses are clear. Other: Mastoid air cells and middle ear cavities are clear. IMPRESSION: Stable extensive subarachnoid, intraparenchymal, and intraventricular hemorrhage with extensive hemorrhage laying along the corpus callosum. No significant associated mass effect or midline shift. Stable borderline ventriculomegaly. Right frontal ventriculostomy has been placed in the interval with its tip in expected position in the region of the foramina [place]. Interval coil embolization of expected anterior communicating artery aneurysm. Electronically Signed   By: Helyn Numbers MD   On: 05/20/2021 21:36   CT Head Wo Contrast  Result Date: 05/19/2021 CLINICAL DATA:  Cerebral hemorrhage suspected. Additional history provided: Altered mental status, patient found down. EXAM: CT HEAD WITHOUT CONTRAST TECHNIQUE: Contiguous axial images were obtained from the base of the skull through the vertex without intravenous contrast. COMPARISON:  Head CT  examinations 05/10/2021 at FINDINGS: Brain: Moderate volume acute hemorrhage is present within the ventricles, most notably within the right lateral and third ventricles.  Small volume dependent hemorrhage is also present within the occipital horn of the left lateral ventricle. Prominence of the lateral and third ventricles compatible with moderate hydrocephalus. Overall moderate to large volume acute subarachnoid hemorrhage tracking along the anterior and superior aspect of the corpus callosum, within the septum pellucidum, as well as within the anterior interhemispheric fissure, left greater than right MCA cisterns and suprasellar cistern. Additional trace scattered acute subarachnoid hemorrhage along the bilateral cerebral hemispheres. Additionally, there is acute parenchymal hemorrhage within the anterior left frontal lobe measuring 2.8 x 1.1 cm. Mild surrounding edema within the anterior left frontal lobe, and also likely within the left callosal body/genu. Trace acute hemorrhage is also present along the falx. No evidence of intracranial mass. No midline shift. Vascular: No hyperdense vessel.  Atherosclerotic calcifications. Skull: Normal. Negative for fracture or focal lesion. Sinuses/Orbits: Visualized orbits show no acute finding. Trace bilateral ethmoid sinus mucosal thickening. These results were called by telephone at the time of interpretation on 05/19/2021 at 7:05 pm to provider Lorre Nick , who verbally acknowledged these results. IMPRESSION: 1. Moderate volume acute intraventricular hemorrhage, as described. Associated prominence of the lateral and third ventricles compatible with moderate hydrocephalus. 2. Overall moderate to large acute subarachnoid hemorrhage, as described and in a distribution suspicious for aneurysm rupture. CTA head/neck recommended for further evaluation. 3. 2.8 x 1.1 cm acute parenchymal hemorrhage within the anterior left frontal lobe. Mild surrounding edema within the anterior left frontal lobe, and also likely within the left callosal body/genu. 4. Additional trace acute hemorrhage along the falx. Electronically Signed   By: Jackey Loge  DO   On: 05/19/2021 19:07   CT ANGIO NECK W OR WO CONTRAST  Result Date: 05/19/2021 CLINICAL DATA:  Patient found down.  Intracranial hemorrhage. EXAM: CT ANGIOGRAPHY HEAD AND NECK TECHNIQUE: Multidetector CT imaging of the head and neck was performed using the standard protocol during bolus administration of intravenous contrast. Multiplanar CT image reconstructions and MIPs were obtained to evaluate the vascular anatomy. Carotid stenosis measurements (when applicable) are obtained utilizing NASCET criteria, using the distal internal carotid diameter as the denominator. CONTRAST:  50mL OMNIPAQUE IOHEXOL 350 MG/ML SOLN COMPARISON:  Noncontrast head CT performed earlier today 05/19/2021. FINDINGS: CTA NECK FINDINGS Aortic arch: Standard aortic branching. The visualized aortic arch is normal in caliber. No hemodynamically significant innominate or proximal subclavian artery stenosis. Right carotid system: CCA and ICA patent within the neck without stenosis. No significant atherosclerotic disease. Left carotid system: CCA and ICA patent within the neck. Minimal soft and calcified plaque within the proximal left ICA results in less than 50% stenosis. Vertebral arteries: Codominant and patent within the neck without stenosis. Skeleton: Cervic advanced cervical spondylosis. Please refer to same day noncontrast CT of the cervical spine for a complete description of cervical spine findings. Other neck: No neck mass or cervical lymphadenopathy. Thyroid unremarkable. Upper chest: No consolidation within the imaged lung apices. Review of the MIP images confirms the above findings CTA HEAD FINDINGS Evaluation is somewhat limited due to suboptimal arterial contrast enhancement. Anterior circulation: The intracranial internal carotid arteries are patent. Mild nonstenotic calcified plaque within the left cavernous and paraclinoid segments. The M1 middle cerebral arteries are patent. No M2 proximal branch occlusion or  high-grade proximal M2 stenosis is identified. The anterior cerebral arteries are patent. The anterior cerebral arteries are patent. 3  mm aneurysm arising from the anterior communicating artery complex (series 7, image 118) (series 8, image 102). Posterior circulation: The intracranial vertebral arteries are patent. The basilar artery is patent. The posterior cerebral arteries are patent. Posterior communicating arteries are hypoplastic or absent bilaterally. Venous sinuses: Within the limitations of contrast timing, no convincing thrombus. Anatomic variants: As described Review of the MIP images confirms the above findings These results were called by telephone at the time of interpretation on 05/19/2021 at 7:58 pm to provider Lorre Nick , who verbally acknowledged these results. IMPRESSION: CTA neck: The common carotid, internal carotid and vertebral arteries are patent within the neck without hemodynamically significant stenosis. Minimal soft and calcified plaque within the proximal left ICA. CTA head: 1. Somewhat limited examination due to suboptimal arterial enhancement. 2. 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 3. No intracranial large vessel occlusion or proximal high-grade arterial stenosis. Electronically Signed   By: Jackey Loge DO   On: 05/19/2021 19:59   CT Cervical Spine Wo Contrast  Result Date: 05/19/2021 CLINICAL DATA:  Found down/unresponsive. EXAM: CT CERVICAL SPINE WITHOUT CONTRAST TECHNIQUE: Multidetector CT imaging of the cervical spine was performed without intravenous contrast. Multiplanar CT image reconstructions were also generated. COMPARISON:  MRI of the cervical spine of 06/19/2020. Today's head CT is dictated separately (by neuroradiology). FINDINGS: Alignment: Spinal visualization through the top of T3. Straightening of expected cervical lordosis. Maintenance of vertebral body  height. Mild anterolisthesis of C4 on C5 is similar to the prior MRI, given differences in technique. Skull base and vertebrae: Skull base intact. Coronal reformats demonstrate a normal C1-C2 articulation. Facets are well-aligned. Right-sided C3-4 facets are partially fused. Advanced spondylosis at C4 through C7 with endplate osteophytes, loss of intervertebral disc height. Soft tissues and spinal canal: Prevertebral soft tissues are within normal limits. Disc levels: Neural foraminal narrowing bilaterally at C3-4 and C4-5 secondary to uncovertebral joint hypertrophy. Right neural foraminal and central canal stenosis at C5-6 secondary to disc osteophyte complex and uncovertebral joint hypertrophy. Bilateral neural foraminal and central canal narrowing at C6-7 secondary to above factors. Upper chest: No apical pneumothorax. Other: None. IMPRESSION: Advanced cervical spondylosis, as detailed above. No acute superimposed process. Nonspecific straightening of expected cervical lordosis. Electronically Signed   By: Jeronimo Greaves M.D.   On: 05/19/2021 18:42   IR Transcath/Emboliz  Result Date: 05/20/2021 PROCEDURE: DIAGNOSTIC CEREBRAL ANGIOGRAM COIL EMBOLIZATION OF ANTERIOR COMMUNICATING ARTERY ANEURYSM HISTORY: The patient is a 60 year old man presenting to the hospital after acute mental status change. His initial workup included CT scan demonstrating significant intraventricular hemorrhage and left-sided subarachnoid hemorrhage. CT angiogram was done demonstrating likelihood of an anterior communicating artery aneurysm. Ventriculostomy was placed and the patient's neurologic status did improve overnight. He therefore presents now for diagnostic cerebral angiogram with possible aneurysm embolization. ACCESS: The technical aspects of the procedure as well as its potential risks and benefits were reviewed with the patient's mother. These risks included but were not limited bleeding, infection, allergic reaction, damage  to organs or vital structures, stroke, non-diagnostic procedure, and the catastrophic outcomes of heart attack, coma, and death. With an understanding of these risks, informed consent was obtained and witnessed. The patient was placed in the supine position on the angiography table and the skin of right groin prepped in the usual sterile fashion. Case was done under general endotracheal anesthesia monitored by the anesthesia service. MEDICATIONS: HEPARIN: 0 Units total. CONTRAST:  cc, Omnipaque 300 FLUOROSCOPY TIME:  FLUOROSCOPY TIME: See IR records TECHNIQUE: CATHETERS AND WIRES 5-French JB-1 catheter 180 cm 0.035" glidewire 6-French NeuronMax guide sheath 6-French Berenstein Select JB-1 catheter 0.058" CatV guidecatheter 150 cm XT 27 microcatheter Synchro 2 select soft microwire Excelsior XT-17 microcatheter COILS USED Target 360 XL 3 mm x 6 cm VESSELS CATHETERIZED Right internal carotid Left internal carotid Left vertebral Right common femoral Right anterior cerebral artery VESSELS STUDIED Right internal carotid, head Left internal carotid, head Left vertebral Right internal carotid artery, head (during embolization) Right internal carotid artery, head (immediate post-embolization) Right internal carotid artery, head (final control) Right common femoral PROCEDURAL NARRATIVE A 5-Fr JB-1 glide catheter was advanced over a 0.035 glidewire into the aortic arch. The above vessels were then sequentially catheterized and cervical / cerebral angiograms taken. After review of images, the catheter was removed without incident. The 5-Fr sheath was then exchanged over the glidewire for an 8-Fr sheath. Under real-time fluoroscopy, the guide sheath was advanced over the select catheter and glidewire into the descending aorta. The select catheter was then advanced into the cervical right internal carotid artery. The guide sheath was then advanced into the proximal cervical right internal carotid artery. The Select catheter was  removed and the 058 guide catheter was coaxially introduced over the microcatheter and microwire. The microcatheter was then advanced under roadmap guidance into the cavernous internal carotid artery. The guide catheter was then tracked into the cavernous segment of the internal carotid artery. The microwire was then used to select the right middle cerebral artery and the 27 microcatheter was advanced into the M1-M2 junction. This allowed tracking of the guide catheter into the supraclinoid internal carotid artery. Fluoro phase run was taken which demonstrated significant contrast stasis within the right carotid distribution suggesting significant catheter induced spasm. I therefore withdrew the guide catheter into the cavernous segment of the internal carotid artery. Another fluoro phase run demonstrated improved flow through the right carotid circulation. At this point the 27 catheter was removed and the 17 catheter was introduced over the same microwire. Initially, the microwire was advanced into the A2 segment which allowed advancement of the microcatheter into the right A1. Microwire was then withdrawn and the aneurysm lumen was catheterized. The above coil was then deployed. Run taken demonstrated occlusion of the dome of the aneurysm, with a small neck residual. There was significant for contrast stasis within the right ICA territory again suggesting significant likely catheter-related spasm. I therefore elected to complete the procedure at this point, with good dome protection of the aneurysm and minimal neck residual. The microcatheter was removed. Immediate post embolization angiogram was taken. The guide catheter and guide sheath were then withdrawn down into the proximal cervical internal carotid artery. Final control angiogram was taken. The guide catheter and guide sheath were then synchronously removed without incident. FINDINGS: Right internal carotid, head: Injection reveals the presence of a widely  patent ICA, M1, and A1 segments and their branches. There is an aneurysm projecting towards the left arising from the right A1 A2 junction. This aneurysm has a relatively narrow neck, with a dome measuring approximately 3.0 x 2.6 mm. There is significant narrowing of the supraclinoid internal carotid artery and M1 and A1 segments likely reflecting vasospasm. The parenchymal and venous phases are normal. The venous sinuses are widely patent. Left internal carotid, head: Injection reveals the presence of a widely patent ICA, A1, and M1 segments and their branches. There is significant narrowing consistent with vasospasm involving  the supraclinoid ICA as well as the first segment its of the anterior and middle cerebral arteries. No left-sided carotid aneurysms are seen. The parenchymal and venous phases are normal. The venous sinuses are widely patent. Left vertebral: Injection reveals the presence of a widely patent vertebral artery. This leads to a widely patent basilar artery that terminates in bilateral P1. The basilar apex is normal. No aneurysms, AVMs, or high-flow fistulas are seen. There is no significant vasospasm of the basilar artery or posterior circulation. The parenchymal and venous phases are normal. The venous sinuses are widely patent. Right internal carotid artery, head (during embolization): Injection reveals the presence of a widely patent ICA that leads to a patent ACA and MCA, although there is significant vasospasm involving the right A1 and M1. Coil mass within the aneurysm is stable, without coil prolapse or filling defect to suggest thrombus. Aneurysm appears to be occluded. Right internal carotid artery, head (immediate post-embolization): Injection reveals the presence of a widely patent ICA that leads to a patent ACA and MCA. Coil mass within the aneurysm is stable, without coil prolapse or filling defect to suggest thrombus. No aneurysm filling is seen. There remains significant vasospasm  involving the supraclinoid internal carotid artery as well as the A1 and M1 segments. Right internal carotid artery, head (final control): Injection reveals the presence of a widely patent ICA that leads to a patent ACA and MCA. No thrombus is visualized. Coil mass is seen within the aneurysm and is in stable position. There is continued spasm involving the supraclinoid internal carotid artery, A1, and M1 segments. Capillary phase however does not demonstrate any perfusion deficits. Venous sinuses are patent. Right femoral: Normal vessel. No significant atherosclerotic disease. Arterial sheath in adequate position. DISPOSITION: Upon completion of the study, the femoral sheath was removed and hemostasis obtained using a 7-Fr ExoSeal closure device. Good proximal and distal lower extremity pulses were documented upon achievement of hemostasis. The procedure was well tolerated and no early complications were observed. The patient was transferred to the postanesthesia care unit in stable hemodynamic condition. IMPRESSION: 1. 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 2. There is moderate vasospasm involving the bilateral supraclinoid internal carotid arteries, and bilateral A1 and M1 segments which does not appear to be flow limiting. The preliminary results of this procedure were shared with the patient's family. 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 His initial workup included CT scan demonstrating significant intraventricular hemorrhage and left-sided subarachnoid hemorrhage. CT angiogram was done demonstrating likelihood of an anterior communicating artery aneurysm. Ventriculostomy was placed  and the patient's neurologic status did improve overnight. He therefore presents now for diagnostic cerebral angiogram with possible aneurysm embolization. ACCESS: The technical aspects of the procedure as well as its potential risks and benefits were reviewed with the patient's mother. These risks included but were not limited bleeding, infection, allergic reaction, damage to organs or vital structures, stroke, non-diagnostic procedure, and the catastrophic outcomes of heart attack, coma, and death. With an understanding of these risks, informed consent was obtained and witnessed. The patient was placed in the supine position on the angiography table and the skin of right groin prepped in the usual sterile fashion. Case was done under general  endotracheal anesthesia monitored by the anesthesia service. MEDICATIONS: HEPARIN: 0 Units total. CONTRAST:  cc, Omnipaque 300 FLUOROSCOPY TIME:  FLUOROSCOPY TIME: See IR records TECHNIQUE: CATHETERS AND WIRES 5-French JB-1 catheter 180 cm 0.035" glidewire 6-French NeuronMax guide sheath 6-French Berenstein Select JB-1 catheter 0.058" CatV guidecatheter 150 cm XT 27 microcatheter Synchro 2 select soft microwire Excelsior XT-17 microcatheter COILS USED Target 360 XL 3 mm x 6 cm VESSELS CATHETERIZED Right internal carotid Left internal carotid Left vertebral Right common femoral Right anterior cerebral artery VESSELS STUDIED Right internal carotid, head Left internal carotid, head Left vertebral Right internal carotid artery, head (during embolization) Right internal carotid artery, head (immediate post-embolization) Right internal carotid artery, head (final control) Right common femoral PROCEDURAL NARRATIVE A 5-Fr JB-1 glide catheter was advanced over a 0.035 glidewire into the aortic arch. The above vessels were then sequentially catheterized and cervical / cerebral angiograms taken. After review of images, the catheter was removed without incident. The 5-Fr sheath was then  exchanged over the glidewire for an 8-Fr sheath. Under real-time fluoroscopy, the guide sheath was advanced over the select catheter and glidewire into the descending aorta. The select catheter was then advanced into the cervical right internal carotid artery. The guide sheath was then advanced into the proximal cervical right internal carotid artery. The Select catheter was removed and the 058 guide catheter was coaxially introduced over the microcatheter and microwire. The microcatheter was then advanced under roadmap guidance into the cavernous internal carotid artery. The guide catheter was then tracked into the cavernous segment of the internal carotid artery. The microwire was then used to select the right middle cerebral artery and the 27 microcatheter was advanced into the M1-M2 junction. This allowed tracking of the guide catheter into the supraclinoid internal carotid artery. Fluoro phase run was taken which demonstrated significant contrast stasis within the right carotid distribution suggesting significant catheter induced spasm. I therefore withdrew the guide catheter into the cavernous segment of the internal carotid artery. Another fluoro phase run demonstrated improved flow through the right carotid circulation. At this point the 27 catheter was removed and the 17 catheter was introduced over the same microwire. Initially, the microwire was advanced into the A2 segment which allowed advancement of the microcatheter into the right A1. Microwire was then withdrawn and the aneurysm lumen was catheterized. The above coil was then deployed. Run taken demonstrated occlusion of the dome of the aneurysm, with a small neck residual. There was significant for contrast stasis within the right ICA territory again suggesting significant likely catheter-related spasm. I therefore elected to complete the procedure at this point, with good dome protection of the aneurysm and minimal neck residual. The microcatheter  was removed. Immediate post embolization angiogram was taken. The guide catheter and guide sheath were then withdrawn down into the proximal cervical internal carotid artery. Final control angiogram was taken. The guide catheter and guide sheath were then synchronously removed without incident. FINDINGS: Right internal carotid, head: Injection reveals the presence of a widely patent ICA, M1, and A1 segments and their branches. There is an aneurysm projecting towards the left arising from the right A1 A2 junction. This aneurysm has a relatively narrow neck, with a dome measuring approximately 3.0 x 2.6 mm. There is significant narrowing of the supraclinoid internal carotid artery and M1 and A1 segments likely reflecting vasospasm. The parenchymal and venous phases are normal. The venous sinuses are widely patent. Left internal carotid, head: Injection reveals the presence of a widely patent ICA, A1,  and M1 segments and their branches. There is significant narrowing consistent with vasospasm involving the supraclinoid ICA as well as the first segment its of the anterior and middle cerebral arteries. No left-sided carotid aneurysms are seen. The parenchymal and venous phases are normal. The venous sinuses are widely patent. Left vertebral: Injection reveals the presence of a widely patent vertebral artery. This leads to a widely patent basilar artery that terminates in bilateral P1. The basilar apex is normal. No aneurysms, AVMs, or high-flow fistulas are seen. There is no significant vasospasm of the basilar artery or posterior circulation. The parenchymal and venous phases are normal. The venous sinuses are widely patent. Right internal carotid artery, head (during embolization): Injection reveals the presence of a widely patent ICA that leads to a patent ACA and MCA, although there is significant vasospasm involving the right A1 and M1. Coil mass within the aneurysm is stable, without coil prolapse or filling defect  to suggest thrombus. Aneurysm appears to be occluded. Right internal carotid artery, head (immediate post-embolization): Injection reveals the presence of a widely patent ICA that leads to a patent ACA and MCA. Coil mass within the aneurysm is stable, without coil prolapse or filling defect to suggest thrombus. No aneurysm filling is seen. There remains significant vasospasm involving the supraclinoid internal carotid artery as well as the A1 and M1 segments. Right internal carotid artery, head (final control): Injection reveals the presence of a widely patent ICA that leads to a patent ACA and MCA. No thrombus is visualized. Coil mass is seen within the aneurysm and is in stable position. There is continued spasm involving the supraclinoid internal carotid artery, A1, and M1 segments. Capillary phase however does not demonstrate any perfusion deficits. Venous sinuses are patent. Right femoral: Normal vessel. No significant atherosclerotic disease. Arterial sheath in adequate position. DISPOSITION: Upon completion of the study, the femoral sheath was removed and hemostasis obtained using a 7-Fr ExoSeal closure device. Good proximal and distal lower extremity pulses were documented upon achievement of hemostasis. The procedure was well tolerated and no early complications were observed. The patient was transferred to the postanesthesia care unit in stable hemodynamic condition. IMPRESSION: 1. 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 2. There is moderate vasospasm involving the bilateral supraclinoid internal carotid arteries, and bilateral A1 and M1 segments which does not appear to be flow limiting. The preliminary results of this procedure were shared with the patient's family. Electronically Signed   By: Lisbeth Renshaw   On: 05/20/2021 16:23   DG CHEST PORT 1 VIEW  Result Date: 05/23/2021 CLINICAL DATA:  Subarachnoid hemorrhage EXAM: PORTABLE CHEST 1 VIEW COMPARISON:  May 19, 2021 FINDINGS: The cardiomediastinal silhouette is unchanged in contour.Enteric tube tip projects over the proximal duodenum. No pleural effusion. No pneumothorax. LEFT basilar linear opacity. Visualized abdomen is unremarkable. Multilevel degenerative changes of the thoracic spine. IMPRESSION: LEFT basilar linear opacity with differential considerations including infection or atelectasis. Electronically Signed   By: Meda Klinefelter MD   On: 05/23/2021 11:38   DG Chest Port 1 View  Result Date: 05/19/2021 CLINICAL DATA:  60 year old male with shortness of breath. EXAM: PORTABLE CHEST 1 VIEW COMPARISON:  None. FINDINGS: No focal consolidation, pleural effusion, or pneumothorax. Mild cardiomegaly. No acute osseous pathology. IMPRESSION: No active disease. Electronically Signed   By: Elgie Collard M.D.   On: 05/19/2021 20:29   DG Abd Portable 1V  Result Date: 05/21/2021 CLINICAL DATA:  Feeding tube placement. EXAM: PORTABLE  ABDOMEN - 1 VIEW COMPARISON:  None. FINDINGS: Bowel gas pattern is normal. Small bore feeding tube terminates in the distal stomach. Lung bases are clear. IMPRESSION: Small bore feeding tube terminates in the distal stomach. Electronically Signed   By: Marin Roberts M.D.   On: 05/21/2021 12:22   ECHOCARDIOGRAM COMPLETE  Result Date: 05/26/2021    ECHOCARDIOGRAM REPORT   Patient Name:   [name] [name] Date of Exam: 05/26/2021 Medical Rec #:  [id number]        Height:       69.0 in Accession #:    [id number]       Weight:       136.5 lb Date of Birth:  November 05, 1961        BSA:          1.756 m Patient Age:    60 years         BP:           184/87 mmHg Patient Gender: M                HR:           91 bpm. Exam Location:  Inpatient Procedure: 2D Echo, 3D Echo, Color Doppler and Cardiac Doppler Indications:    I42.9 Cardiomyopathy (unspecified)  History:        Patient has prior history of Echocardiogram examinations, most                 recent 05/23/2021.  Sonographer:    Irving Burton  Senior RDCS Referring Phys: [id number] RAVI AGARWALA IMPRESSIONS  1. Left ventricular ejection fraction by 3D volume is 63 %. The left ventricle has normal function. The left ventricle has no regional wall motion abnormalities. Left ventricular diastolic parameters are consistent with Grade I diastolic dysfunction (impaired relaxation).  2. Right ventricular systolic function is normal. The right ventricular size is normal. Tricuspid regurgitation signal is inadequate for assessing PA pressure.  3. The mitral valve is normal in structure. Trivial mitral valve regurgitation. No evidence of mitral stenosis.  4. The aortic valve is tricuspid. Aortic valve regurgitation is not visualized. No aortic stenosis is present.  5. The inferior vena cava is normal in size with greater than 50% respiratory variability, suggesting right atrial pressure of 3 mmHg. FINDINGS  Left Ventricle: Left ventricular ejection fraction by 3D volume is 63 %. The left ventricle has normal function. The left ventricle has no regional wall motion abnormalities. The left ventricular internal cavity size was normal in size. There is no left  ventricular hypertrophy. Left ventricular diastolic parameters are consistent with Grade I diastolic dysfunction (impaired relaxation). Right Ventricle: The right ventricular size is normal. No increase in right ventricular wall thickness. Right ventricular systolic function is normal. Tricuspid regurgitation signal is inadequate for assessing PA pressure. Left Atrium: Left atrial size was normal in size. Right Atrium: Right atrial size was normal in size. Pericardium: There is no evidence of pericardial effusion. Mitral Valve: The mitral valve is normal in structure. Trivial mitral valve regurgitation. No evidence of mitral valve stenosis. Tricuspid Valve: The tricuspid valve is normal in structure. Tricuspid valve regurgitation is not demonstrated. No evidence of tricuspid stenosis. Aortic Valve: The aortic valve  is tricuspid. Aortic valve regurgitation is not visualized. No aortic stenosis is present. Pulmonic Valve: The pulmonic valve was normal in structure. Pulmonic valve regurgitation is trivial. No evidence of pulmonic stenosis. Aorta: The aortic root is normal in size and structure. Venous: The inferior vena  cava is normal in size with greater than 50% respiratory variability, suggesting right atrial pressure of 3 mmHg. IAS/Shunts: No atrial level shunt detected by color flow Doppler.  LEFT VENTRICLE PLAX 2D LVIDd:         4.30 cm         Diastology LVIDs:         2.90 cm         LV e' medial:    7.62 cm/s LV PW:         0.90 cm         LV E/e' medial:  9.3 LV IVS:        0.60 cm         LV e' lateral:   10.00 cm/s LVOT diam:     2.00 cm         LV E/e' lateral: 7.1 LV SV:         70 LV SV Index:   40 LVOT Area:     3.14 cm        3D Volume EF                                LV 3D EF:    Left                                             ventricular                                             ejection                                             fraction by                                             3D volume                                             is 63 %.                                 3D Volume EF:                                3D EF:        63 %                                LV EDV:       124 ml  LV ESV:       46 ml                                LV SV:        77 ml RIGHT VENTRICLE RV S prime:     7.72 cm/s TAPSE (M-mode): 1.8 cm LEFT ATRIUM             Index       RIGHT ATRIUM           Index LA diam:        3.90 cm 2.22 cm/m  RA Area:     11.20 cm LA Vol (A2C):   38.0 ml 21.64 ml/m RA Volume:   22.40 ml  12.75 ml/m LA Vol (A4C):   40.2 ml 22.89 ml/m LA Biplane Vol: 39.7 ml 22.61 ml/m  AORTIC VALVE LVOT Vmax:   140.00 cm/s LVOT Vmean:  95.000 cm/s LVOT VTI:    0.223 m  AORTA Ao Root diam: 3.30 cm Ao Asc diam:  3.10 cm MITRAL VALVE MV Area (PHT): 3.21 cm    SHUNTS MV Decel  Time: 236 msec    Systemic VTI:  0.22 m MV E velocity: 71.00 cm/s  Systemic Diam: 2.00 cm MV A velocity: 94.00 cm/s MV E/A ratio:  0.76 [name] Brass MD Electronically signed by [name] Brass MD Signature Date/Time: 05/26/2021/9:20:19 PM    Final    VAS [place] TRANSCRANIAL DOPPLER  Result Date: 06/04/2021  Transcranial Doppler Patient Name:  [name] [name]  Date of Exam:   06/02/2021 Medical Rec #: [id number]         Accession #:    [id number] Date of Birth: [birth year]         Patient Gender: M Patient Age:   60Y Exam Location:  [facility] Procedure:      VAS [place] TRANSCRANIAL DOPPLER Referring Phys: [id number] Donalee Citrin --------------------------------------------------------------------------------  Indications: Subarachnoid hemorrhage. Limitations: Patient unable to remain stationary for duration of examination. Comparison Study: Most recent prior study 05-31-2021 Performing Technologist: Jean Rosenthal RDMS,RVT  Examination Guidelines: A complete evaluation includes B-mode imaging, spectral Doppler, color Doppler, and power Doppler as needed of all accessible portions of each vessel. Bilateral testing is considered an integral part of a complete examination. Limited examinations for reoccurring indications may be performed as noted.  +----------+-------------+----------+-----------+-------+ RIGHT TCD Right VM (cm)Depth (cm)PulsatilityComment +----------+-------------+----------+-----------+-------+ MCA          119.00       5.50      0.72            +----------+-------------+----------+-----------+-------+ ACA          -31.00                 0.68            +----------+-------------+----------+-----------+-------+ Term ICA     111.00       5.60      0.67            +----------+-------------+----------+-----------+-------+ PCA           38.00                 0.67            +----------+-------------+----------+-----------+-------+ Opthalmic     29.00                 1.24             +----------+-------------+----------+-----------+-------+  ICA siphon    29.00                 1.17            +----------+-------------+----------+-----------+-------+ Vertebral    -20.00                 0.79            +----------+-------------+----------+-----------+-------+ Distal ICA    23.00                 0.82            +----------+-------------+----------+-----------+-------+  +----------+------------+----------+-----------+-------+ LEFT TCD  Left VM (cm)Depth (cm)PulsatilityComment +----------+------------+----------+-----------+-------+ MCA          130.00      4.10      0.86            +----------+------------+----------+-----------+-------+ ACA          -26.00                0.32            +----------+------------+----------+-----------+-------+ Term ICA     36.00       5.70      0.79            +----------+------------+----------+-----------+-------+ PCA          25.00                 1.16            +----------+------------+----------+-----------+-------+ Opthalmic    27.00                 1.01            +----------+------------+----------+-----------+-------+ ICA siphon   34.00                 1.29            +----------+------------+----------+-----------+-------+ Vertebral    -22.00                0.61            +----------+------------+----------+-----------+-------+ Distal ICA   20.00                 0.86            +----------+------------+----------+-----------+-------+  +------------+-------+------------------+             VM cm/s     Comment       +------------+-------+------------------+ Prox Basilar       Unable to insonate +------------+-------+------------------+ +----------------------+----+ Right Lindegaard Ratio5.17 +----------------------+----+ +---------------------+----+ Left Lindegaard Ratio6.50 +---------------------+----+  Summary:  Elevated mean flow velocities in bilateral middle  cerebral and terminal right internal carotid arteries suggestive of mild vasospasm. *See table(s) above for TCD measurements and observations.  Diagnosing physician: Delia Heady MD Electronically signed by Delia Heady MD on 06/04/2021 at 12:01:08 PM.    Final    VAS [place] TRANSCRANIAL DOPPLER  Result Date: 05/31/2021  Transcranial Doppler Patient Name:  ZAMIR STAPLES  Date of Exam:   05/31/2021 Medical Rec #: [id number]         Accession #:    [id number] Date of Birth: 30-Apr-1961         Patient Gender: M Patient Age:   60Y Exam Location:  [facility] Procedure:      VAS [place] TRANSCRANIAL DOPPLER Referring Phys: [id number] Donalee Citrin --------------------------------------------------------------------------------  Indications: Subarachnoid hemorrhage. Comparison Study: 05/28/21- TCD Performing Technologist: Gertie Fey MHA, RDMS, RVT, RDCS  Examination Guidelines: A complete evaluation includes B-mode imaging, spectral Doppler, color Doppler, and power Doppler as needed of all accessible portions of each vessel. Bilateral testing is considered an integral part of a complete examination. Limited examinations for reoccurring indications may be performed as noted.  +----------+-------------+----------+-----------+------------------+ RIGHT TCD Right VM (cm)Depth (cm)Pulsatility     Comment       +----------+-------------+----------+-----------+------------------+ MCA           92.00       4.70      0.96                       +----------+-------------+----------+-----------+------------------+ ACA          -26.00                 0.75                       +----------+-------------+----------+-----------+------------------+ Term ICA      21.00                 1.26                       +----------+-------------+----------+-----------+------------------+ PCA           18.00                 1.21                       +----------+-------------+----------+-----------+------------------+  Opthalmic     20.00                 1.24                       +----------+-------------+----------+-----------+------------------+ ICA siphon                                  Unable to insonate +----------+-------------+----------+-----------+------------------+ Vertebral                                   Unable to insonate +----------+-------------+----------+-----------+------------------+ Distal ICA    36.00                                            +----------+-------------+----------+-----------+------------------+  +----------+------------+----------+-----------+------------------+ LEFT TCD  Left VM (cm)Depth (cm)Pulsatility     Comment       +----------+------------+----------+-----------+------------------+ MCA          93.00                 0.99                       +----------+------------+----------+-----------+------------------+ ACA                                        Unable to insonate +----------+------------+----------+-----------+------------------+ Term ICA     46.00                 0.84                       +----------+------------+----------+-----------+------------------+  PCA                                        Unable to insonate +----------+------------+----------+-----------+------------------+ Opthalmic    24.00                 1.24                       +----------+------------+----------+-----------+------------------+ ICA siphon                                 Unable to insonate +----------+------------+----------+-----------+------------------+ Vertebral                                  Unable to insonate +----------+------------+----------+-----------+------------------+ Distal ICA   28.00                                            +----------+------------+----------+-----------+------------------+  +------------+-------+------------------+             VM cm/s     Comment        +------------+-------+------------------+ Prox Basilar       Unable to insonate +------------+-------+------------------+ +----------------------+----+ Right Lindegaard Ratio2.56 +----------------------+----+ +---------------------+----+ Left Lindegaard Ratio3.32 +---------------------+----+  Summary:  Mildly elevared bilateral midle cerebral artery mean flow velocities of unclear significance.Not all vessels could be studied due to technical difficulties. *See table(s) above for TCD measurements and observations.  Diagnosing physician: Delia Heady MD Electronically signed by Delia Heady MD on 05/31/2021 at 4:39:43 PM.    Final    VAS [place] TRANSCRANIAL DOPPLER  Result Date: 05/28/2021  Transcranial Doppler Patient Name:  [name] [name]  Date of Exam:   05/28/2021 Medical Rec #: [id number]         Accession #:    [id number] Date of Birth: [birth year]         Patient Gender: M Patient Age:   60Y Exam Location:  [facility] Procedure:      VAS [place] TRANSCRANIAL DOPPLER Referring Phys: [id number] Donalee Citrin --------------------------------------------------------------------------------  Indications: Subarachnoid hemorrhage. 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 Limitations: Patient movement throughout the exam Limitations for diagnostic windows: Unable to insonate right transtemporal window. Unable to insonate left transtemporal window. Unable to insonate occipital window. Performing Technologist: Marilynne Halsted RDMS, RVT  Examination Guidelines: A complete evaluation includes B-mode imaging, spectral Doppler, color Doppler, and power Doppler as needed of all accessible portions of each vessel. Bilateral testing is considered an integral part of a complete examination. Limited examinations for reoccurring indications may be performed as noted.  +----------+-------------+----------+-----------+-------------+ RIGHT TCD Right VM (cm)Depth (cm)Pulsatility   Comment     +----------+-------------+----------+-----------+-------------+ MCA          155.00       5.60      0.71                  +----------+-------------+----------+-----------+-------------+ ACA                                         not insonated +----------+-------------+----------+-----------+-------------+ Term  ICA                                    not insonated +----------+-------------+----------+-----------+-------------+ PCA           30.00                 0.70                  +----------+-------------+----------+-----------+-------------+ Opthalmic                                   not insonated +----------+-------------+----------+-----------+-------------+ ICA siphon                                  not insonated +----------+-------------+----------+-----------+-------------+ Vertebral                                   not insonated +----------+-------------+----------+-----------+-------------+ Distal ICA                                  not insonated +----------+-------------+----------+-----------+-------------+  +----------+------------+----------+-----------+-------------+ LEFT TCD  Left VM (cm)Depth (cm)Pulsatility   Comment    +----------+------------+----------+-----------+-------------+ MCA          166.00      4.20      0.81                  +----------+------------+----------+-----------+-------------+ ACA                                        not insonated +----------+------------+----------+-----------+-------------+ Term ICA                                   not insonated +----------+------------+----------+-----------+-------------+ PCA          54.00                 0.83                  +----------+------------+----------+-----------+-------------+ Opthalmic                                  not insonated +----------+------------+----------+-----------+-------------+ ICA siphon                                  not insonated +----------+------------+----------+-----------+-------------+ Vertebral                                  not insonated +----------+------------+----------+-----------+-------------+ Distal ICA                                 not insonated +----------+------------+----------+-----------+-------------+  +------------+-------+-------------+             VM cm/s   Comment    +------------+-------+-------------+ Prox Basilar  not insonated +------------+-------+-------------+ Dist Basilar       not insonated +------------+-------+-------------+ Summary:  patient movement limited today's study. only b/l MCA and PCA were able to recorded. with this limited data, still has elevated b/l MCA MVFs, not significantly changed from prior, still suggesting moderate vasospasm b/l MCAs. clinical correlation recommended. *See table(s) above for TCD measurements and observations.  Diagnosing physician: Marvel Plan MD Electronically signed by Marvel Plan MD on 05/28/2021 at 2:46:59 PM.    Final    VAS [place] TRANSCRANIAL DOPPLER  Result Date: 05/28/2021  Transcranial Doppler Patient Name:  [name] [name]  Date of Exam:   05/26/2021 Medical Rec #: [id number]         Accession #:    [id number] Date of Birth: 1961-05-29         Patient Gender: M Patient Age:   60Y Exam Location:  [facility] Procedure:      VAS [place] TRANSCRANIAL DOPPLER Referring Phys: [id number] Donalee Citrin --------------------------------------------------------------------------------  Indications: Subarachnoid hemorrhage. Limitations: Patient position/ lines/ drains Comparison Study: 05/24/21 Performing Technologist: Jeb Levering RDMS, RVT  Examination Guidelines: A complete evaluation includes B-mode imaging, spectral Doppler, color Doppler, and power Doppler as needed of all accessible portions of each vessel. Bilateral testing is considered an integral part of a complete examination. Limited examinations for reoccurring  indications may be performed as noted.  +----------+-------------+----------+-----------+--------------+ RIGHT TCD Right VM (cm)Depth (cm)Pulsatility   Comment     +----------+-------------+----------+-----------+--------------+ MCA          128.00                 0.84                   +----------+-------------+----------+-----------+--------------+ ACA          -144.00                0.67                   +----------+-------------+----------+-----------+--------------+ Term ICA      67.00                 0.98                   +----------+-------------+----------+-----------+--------------+ PCA           29.00                 0.96                   +----------+-------------+----------+-----------+--------------+ Opthalmic     22.00                 0.95                   +----------+-------------+----------+-----------+--------------+ ICA siphon                                  not visualized +----------+-------------+----------+-----------+--------------+ Vertebral    -52.00                                        +----------+-------------+----------+-----------+--------------+ Distal ICA    38.00                                        +----------+-------------+----------+-----------+--------------+  +----------+------------+----------+-----------+--------------+  LEFT TCD  Left VM (cm)Depth (cm)Pulsatility   Comment     +----------+------------+----------+-----------+--------------+ MCA          156.00                0.76                   +----------+------------+----------+-----------+--------------+ ACA          -55.00                0.76                   +----------+------------+----------+-----------+--------------+ Term ICA     38.00                 0.95                   +----------+------------+----------+-----------+--------------+ PCA          30.00                 0.96                    +----------+------------+----------+-----------+--------------+ Opthalmic    20.00                 1.73                   +----------+------------+----------+-----------+--------------+ ICA siphon                                 not visualized +----------+------------+----------+-----------+--------------+ Vertebral    -53.00                                       +----------+------------+----------+-----------+--------------+ Distal ICA   33.00                                        +----------+------------+----------+-----------+--------------+  +------------+-------+-------+             VM cm/sComment +------------+-------+-------+ Prox Basilar-58.00         +------------+-------+-------+ Dist Basilar-47.00         +------------+-------+-------+ +----------------------+----+ Right Lindegaard Ratio3.37 +----------------------+----+ +---------------------+----+ Left Lindegaard Ratio4.73 +---------------------+----+  Summary:  elevated b/l MCA MFVs as well as b/l Lindegaard ratios no significant or slightly improved from prior, still suggesting mild to moderate vasospasm more on the left. right ACA MFV also elevated this time which was not present on prior study. However, left  ACA elevated MFV seems resolved. continue monitoring warranted. clinical correlation recommended. *See table(s) above for TCD measurements and observations.  Diagnosing physician: Marvel Plan MD Electronically signed by Marvel Plan MD on 05/28/2021 at 2:45:06 PM.    Final    VAS [place] TRANSCRANIAL DOPPLER  Result Date: 05/28/2021  Transcranial Doppler Patient Name:  [name] [name]  Date of Exam:   05/24/2021 Medical Rec #: [id number]         Accession #:    [id number] Date of Birth: 1961/12/11         Patient Gender: M Patient Age:   60Y Exam Location:  [facility] Procedure:      VAS [place] TRANSCRANIAL DOPPLER Referring Phys: [id number] Donalee Citrin  --------------------------------------------------------------------------------  Indications: Subarachnoid hemorrhage. Limitations: Patient unable to cooperate; combative. Limitations  for diagnostic windows: Unable to insonate occipital window. Comparison Study: 05/21/21- TCD Performing Technologist: Gertie Fey MHA, RDMS, RVT, RDCS  Examination Guidelines: A complete evaluation includes B-mode imaging, spectral Doppler, color Doppler, and power Doppler as needed of all accessible portions of each vessel. Bilateral testing is considered an integral part of a complete examination. Limited examinations for reoccurring indications may be performed as noted.  +----------+-------------+----------+-----------+------------------+ RIGHT TCD Right VM (cm)Depth (cm)Pulsatility     Comment       +----------+-------------+----------+-----------+------------------+ MCA          135.00       5.40      0.94                       +----------+-------------+----------+-----------+------------------+ ACA          -38.00                 1.13                       +----------+-------------+----------+-----------+------------------+ Term ICA      21.00                 1.20                       +----------+-------------+----------+-----------+------------------+ PCA           46.00                 0.90                       +----------+-------------+----------+-----------+------------------+ Opthalmic     17.00                 1.27                       +----------+-------------+----------+-----------+------------------+ ICA siphon    21.00                  1.2                       +----------+-------------+----------+-----------+------------------+ Vertebral                                   Unable to insonate +----------+-------------+----------+-----------+------------------+ Distal ICA    29.00                                             +----------+-------------+----------+-----------+------------------+  +----------+------------+----------+-----------+------------------+ LEFT TCD  Left VM (cm)Depth (cm)Pulsatility     Comment       +----------+------------+----------+-----------+------------------+ MCA          142.00      5.40      0.79                       +----------+------------+----------+-----------+------------------+ ACA         -148.00                0.53                       +----------+------------+----------+-----------+------------------+ Term ICA     99.00  0.84                       +----------+------------+----------+-----------+------------------+ PCA          20.00                 1.00                       +----------+------------+----------+-----------+------------------+ Opthalmic    25.00                 1.37                       +----------+------------+----------+-----------+------------------+ ICA siphon   47.00                 1.03                       +----------+------------+----------+-----------+------------------+ Vertebral                                  Unable to insonate +----------+------------+----------+-----------+------------------+ Distal ICA   32.00                                            +----------+------------+----------+-----------+------------------+  +------------+-------+------------------+             VM cm/s     Comment       +------------+-------+------------------+ Prox Basilar       Unable to insonate +------------+-------+------------------+ Dist Basilar       Unable to insonate +------------+-------+------------------+ +----------------------+----+ Right Lindegaard Ratio4.66 +----------------------+----+ +---------------------+----+ Left Lindegaard Ratio4.44 +---------------------+----+  Summary:  elevated MFVs at b/l MCAs, left ACA and terminal ICA suggesting b/l mild to moderate vasospasm. however,  the b/l MCA MFVs seem improved from 3 days ago, as well as the b/l Lindegaard ratios. Yet the MFV of left ACA was significant elevated from 3 days ago, further close monitoring warranted. clinical correlation recommended. *See table(s) above for TCD measurements and observations.  Diagnosing physician: Marvel Plan MD Electronically signed by Marvel Plan MD on 05/28/2021 at 2:41:32 PM.    Final    VAS [place] TRANSCRANIAL DOPPLER  Result Date: 05/28/2021  Transcranial Doppler Patient Name:  [name] [name]  Date of Exam:   05/21/2021 Medical Rec #: [id number]         Accession #:    [id number] Date of Birth: 1961/07/16         Patient Gender: M Patient Age:   60Y Exam Location:  [facility] Procedure:      VAS [place] TRANSCRANIAL DOPPLER Referring Phys: [id number] Donalee Citrin --------------------------------------------------------------------------------  Indications: Subarachnoid hemorrhage. History: History of drug and alcohol abuse. No other significant history. Comparison Study: No prior study Performing Technologist: Gertie Fey MHA, RDMS, RVT, RDCS  Examination Guidelines: A complete evaluation includes B-mode imaging, spectral Doppler, color Doppler, and power Doppler as needed of all accessible portions of each vessel. Bilateral testing is considered an integral part of a complete examination. Limited examinations for reoccurring indications may be performed as noted.  +----------+-------------+----------+-----------+------------------+ RIGHT TCD Right VM (cm)Depth (cm)Pulsatility     Comment       +----------+-------------+----------+-----------+------------------+ MCA          154.00       5.00  0.73                       +----------+-------------+----------+-----------+------------------+ ACA          -54.00                 0.74                       +----------+-------------+----------+-----------+------------------+ Term ICA     129.00       5.60      0.74                        +----------+-------------+----------+-----------+------------------+ PCA           21.00                 1.31                       +----------+-------------+----------+-----------+------------------+ Opthalmic     28.00                 1.11                       +----------+-------------+----------+-----------+------------------+ ICA siphon                                  Unable to insonate +----------+-------------+----------+-----------+------------------+ Vertebral    -21.00                 0.64                       +----------+-------------+----------+-----------+------------------+ Distal ICA    26.00                                            +----------+-------------+----------+-----------+------------------+  +----------+------------+----------+-----------+-------+ LEFT TCD  Left VM (cm)Depth (cm)PulsatilityComment +----------+------------+----------+-----------+-------+ MCA          158.00      5.10      0.64            +----------+------------+----------+-----------+-------+ ACA          -37.00                0.59            +----------+------------+----------+-----------+-------+ Term ICA     29.00                 0.75            +----------+------------+----------+-----------+-------+ PCA          25.00                 1.03            +----------+------------+----------+-----------+-------+ Opthalmic    21.00                 1.36            +----------+------------+----------+-----------+-------+ Vertebral    -28.00                0.94            +----------+------------+----------+-----------+-------+ Distal ICA   27.00                                 +----------+------------+----------+-----------+-------+  +------------+-------+------------------+  VM cm/s     Comment       +------------+-------+------------------+ Prox Basilar-65.00                    +------------+-------+------------------+ Dist  Basilar       Unable to insonate +------------+-------+------------------+ +----------------------+---+ Right Lindegaard Ratio5.9 +----------------------+---+ +---------------------+---+ Left Lindegaard Ratio5.9 +---------------------+---+  Summary:  significantly elevated MVF at b/l MCAs and right terminal ICA with b/l elevated Lindegaard ratios indicating mild to moderate b/l MCA vasospam. clinical correlation strongly recommended. *See table(s) above for TCD measurements and observations.  Diagnosing physician: Marvel Plan MD Electronically signed by Marvel Plan MD on 05/28/2021 at 2:36:17 PM.    Final    ECHOCARDIOGRAM LIMITED  Result Date: 05/23/2021    ECHOCARDIOGRAM LIMITED REPORT   Patient Name:   [name] [name] Date of Exam: 05/23/2021 Medical Rec #:  [id number]        Height:       69.0 in Accession #:    [id number]       Weight:       141.3 lb Date of Birth:  1961/11/24        BSA:          1.782 m Patient Age:    60 years         BP:           135/73 mmHg Patient Gender: M                HR:           85 bpm. Exam Location:  Inpatient Procedure: Limited Echo, Cardiac Doppler and Color Doppler STAT ECHO Indications:    Stroke  History:        Patient has no prior history of Echocardiogram examinations.                 Subarachnoid hemorrhage. H/o cocaine use.  Sonographer:    Ross Ludwig RDCS (AE) Referring Phys: [id number] Jadene Pierini IMPRESSIONS  1. Left ventricular ejection fraction, by estimation, is 60 to 65%. The left ventricle has normal function. The left ventricle has no regional wall motion abnormalities. Left ventricular diastolic parameters were normal.  2. Right ventricular systolic function is normal. The right ventricular size is normal.  3. The mitral valve is normal in structure. No evidence of mitral valve regurgitation. No evidence of mitral stenosis.  4. The aortic valve is tricuspid. Aortic valve regurgitation is not visualized. No aortic stenosis is present.  5. The  inferior vena cava is normal in size with greater than 50% respiratory variability, suggesting right atrial pressure of 3 mmHg. FINDINGS  Left Ventricle: Left ventricular ejection fraction, by estimation, is 60 to 65%. The left ventricle has normal function. The left ventricle has no regional wall motion abnormalities. The left ventricular internal cavity size was normal in size. There is  no left ventricular hypertrophy. Left ventricular diastolic parameters were normal. Right Ventricle: The right ventricular size is normal.Right ventricular systolic function is normal. Left Atrium: Left atrial size was normal in size. Right Atrium: Right atrial size was normal in size. Pericardium: There is no evidence of pericardial effusion. Mitral Valve: The mitral valve is normal in structure. No evidence of mitral valve stenosis. Tricuspid Valve: The tricuspid valve is normal in structure. Tricuspid valve regurgitation is trivial. No evidence of tricuspid stenosis. Aortic Valve: The aortic valve is tricuspid. Aortic valve regurgitation is not visualized. No aortic stenosis is present. Pulmonic Valve: The pulmonic valve  was normal in structure. Pulmonic valve regurgitation is not visualized. No evidence of pulmonic stenosis. Aorta: The aortic root is normal in size and structure. Venous: The inferior vena cava is normal in size with greater than 50% respiratory variability, suggesting right atrial pressure of 3 mmHg. IAS/Shunts: No atrial level shunt detected by color flow Doppler. LEFT VENTRICLE PLAX 2D LVIDd:         4.40 cm  Diastology LVIDs:         2.90 cm  LV e' medial:    9.14 cm/s LV PW:         1.10 cm  LV E/e' medial:  8.9 LV IVS:        0.80 cm  LV e' lateral:   10.20 cm/s LVOT diam:     2.00 cm  LV E/e' lateral: 8.0 LVOT Area:     3.14 cm  IVC IVC diam: 1.30 cm LEFT ATRIUM         Index LA diam:    2.90 cm 1.63 cm/m   AORTA Ao Root diam: 3.40 cm Ao Asc diam:  3.10 cm MITRAL VALVE MV Area (PHT): 3.30 cm    SHUNTS  MV Decel Time: 230 msec    Systemic Diam: 2.00 cm MV E velocity: 81.50 cm/s MV A velocity: 84.80 cm/s MV E/A ratio:  0.96 Olga Millers MD Electronically signed by Olga Millers MD Signature Date/Time: 05/23/2021/11:48:54 AM    Final    [place] EKG SITE RITE  Result Date: 05/23/2021 If Site Rite image not attached, placement could not be confirmed due to current cardiac rhythm.   Labs:  CBC: Recent Labs    06/07/21 0517 06/08/21 0620 06/11/21 0821 06/13/21 1009  WBC 8.5 11.1* 6.3 6.2  HGB 12.4* 12.9* 12.5* 12.6*  HCT 38.1* 39.3 39.1 37.7*  PLT 634* 473* 411* 344    COAGS: Recent Labs    05/19/21 2021 05/23/21 1032  INR 1.0 1.1  APTT 26 28    BMP: Recent Labs    06/05/21 0731 06/08/21 0620 06/11/21 0821 06/13/21 1009  NA 137 144 145 143  K 4.2 3.9 3.9 3.8  CL 100 110 108 108  CO2 30 28 28 29   GLUCOSE 146* 117* 116* 102*  BUN 16 15 15 16   CALCIUM 9.5 9.9 9.8 9.3  CREATININE 0.56* 0.57* 0.64 0.58*  GFRNONAA >60 >60 >60 >60    LIVER FUNCTION TESTS: Recent Labs    05/31/21 0509 06/01/21 0542 06/02/21 0538 06/03/21 0500  BILITOT 0.5 0.3 0.4 0.6  AST 67* 47* 40 32  ALT 135* 108* 90* 77*  ALKPHOS 69 64 67 62  PROT 6.2* 6.0* 6.4* 6.0*  ALBUMIN 3.0* 3.0* 3.2* 3.1*    TUMOR MARKERS: No results for input(s): AFPTM, CEA, CA199, CHROMGRNA in the last 8760 hours.  Assessment and Plan: 60 y.o. male with SAH secondary to ruptured aneurysm s/p coiling of the anterior communicating aneurysm with Dr. Conchita Paris on 05/20/2021, complicated by hydrocephalus s/p VP shunt placement with Dr. Conchita Paris on 06/08/21. Patient has been admitted since 05/19/2021 and remains confused with no significant neurological improvement.  Patient has been receiving nutrition per NG tube and reminds in the dysphagia 1 diet.  Due to patient's poor p.o. intake, a feeding tube placement was recommended to the patient for his family members.  After thorough discussion and shared decision making,  patient's family decided to proceed with the feeding tube placement. IR was requested for an image guided gastrostomy tube placement.  The  procedure is tentatively scheduled for tomorrow 06/15/2021 pending IR schedule. N.p.o. at midnight/stop tube feeding at [facility] notified VSS, afebrile CBC with normal WBC count, Hgb 12.6, PLT 344 INR 1.1 on 05/23/21  Sq heparin held Ancef 2 g ordered  Patient is confused, not able to make his own medical decision at this time. The procedure was discussed with his mother Ms. Jama Flavors.   Risks and benefits image guided gastrostomy tube placement was discussed with the patient's mother including, but not limited to  bleeding, infection, peritonitis and/or damage to adjacent structures.  All of the mother's questions were answered, she is agreeable to proceed.  Consent signed and in chart.   Thank you for this interesting consult.  I greatly enjoyed meeting [name] [name] and look forward to participating in their care.  A copy of this report was sent to the requesting provider on this date.  Electronically Signed: Willette Brace, PA-C 06/14/2021, 1:42 PM   I spent a total of 30 minutes in face to face in clinical consultation, greater than 50% of which was counseling/coordinating care for gastrostomy tube placement.

## 2021-06-15 ENCOUNTER — Inpatient Hospital Stay (HOSPITAL_COMMUNITY): Payer: 59

## 2021-06-15 DIAGNOSIS — I67848 Other cerebrovascular vasospasm and vasoconstriction: Secondary | ICD-10-CM | POA: Diagnosis not present

## 2021-06-15 DIAGNOSIS — I608 Other nontraumatic subarachnoid hemorrhage: Secondary | ICD-10-CM | POA: Diagnosis not present

## 2021-06-15 DIAGNOSIS — Z4659 Encounter for fitting and adjustment of other gastrointestinal appliance and device: Secondary | ICD-10-CM | POA: Diagnosis not present

## 2021-06-15 DIAGNOSIS — I609 Nontraumatic subarachnoid hemorrhage, unspecified: Secondary | ICD-10-CM | POA: Diagnosis not present

## 2021-06-15 HISTORY — PX: IR GASTROSTOMY TUBE MOD SED: IMG625

## 2021-06-15 LAB — BASIC METABOLIC PANEL
Anion gap: 8 (ref 5–15)
BUN: 16 mg/dL (ref 6–20)
CO2: 30 mmol/L (ref 22–32)
Calcium: 9.8 mg/dL (ref 8.9–10.3)
Chloride: 106 mmol/L (ref 98–111)
Creatinine, Ser: 0.71 mg/dL (ref 0.61–1.24)
GFR, Estimated: >60 mL/min (ref >60)
Glucose, Bld: 111 mg/dL — ABNORMAL HIGH (ref 70–99)
Potassium: 5 mmol/L (ref 3.5–5.1)
Sodium: 144 mmol/L (ref 135–145)

## 2021-06-15 LAB — GLUCOSE, CAPILLARY
Glucose-Capillary: 105 mg/dL — ABNORMAL HIGH (ref 70–99)
Glucose-Capillary: 108 mg/dL — ABNORMAL HIGH (ref 70–99)
Glucose-Capillary: 176 mg/dL — ABNORMAL HIGH (ref 70–99)
Glucose-Capillary: 90 mg/dL (ref 70–99)

## 2021-06-15 MED ORDER — MIDAZOLAM HCL 2 MG/2ML IJ SOLN
INTRAMUSCULAR | Status: AC | PRN
  Administered 2021-06-15: 1 mg via INTRAVENOUS

## 2021-06-15 MED ORDER — LIDOCAINE HCL (PF) 1 % IJ SOLN
INTRAMUSCULAR | Status: AC | PRN
  Administered 2021-06-15: 5 mL

## 2021-06-15 MED ORDER — CEFAZOLIN SODIUM-DEXTROSE 2-4 GM/100ML-% IV SOLN
INTRAVENOUS | Status: AC
  Filled 2021-06-15: qty 100

## 2021-06-15 MED ORDER — HEPARIN SODIUM (PORCINE) 5000 UNIT/ML IJ SOLN
5000.0000 [IU] | Freq: Three times a day (TID) | INTRAMUSCULAR | Status: DC
  Administered 2021-06-15 – 2021-07-05 (×61): 5000 [IU] via SUBCUTANEOUS
  Filled 2021-06-15 (×59): qty 1

## 2021-06-15 MED ORDER — FENTANYL CITRATE (PF) 100 MCG/2ML IJ SOLN
INTRAMUSCULAR | Status: AC | PRN
  Administered 2021-06-15: 50 ug via INTRAVENOUS

## 2021-06-15 MED ORDER — IOHEXOL 300 MG/ML  SOLN
50.0000 mL | Freq: Once | INTRAMUSCULAR | Status: AC | PRN
  Administered 2021-06-15: 15 mL

## 2021-06-15 MED ORDER — LIDOCAINE HCL (PF) 1 % IJ SOLN
INTRAMUSCULAR | Status: AC
  Filled 2021-06-15: qty 30

## 2021-06-15 MED ORDER — GLUCAGON HCL RDNA (DIAGNOSTIC) 1 MG IJ SOLR
INTRAMUSCULAR | Status: AC
  Filled 2021-06-15: qty 1

## 2021-06-15 MED ORDER — FENTANYL CITRATE (PF) 100 MCG/2ML IJ SOLN
INTRAMUSCULAR | Status: AC
  Filled 2021-06-15: qty 2

## 2021-06-15 MED ORDER — FREE WATER
200.0000 mL | Freq: Four times a day (QID) | Status: DC
  Administered 2021-06-15 – 2021-06-17 (×8): 200 mL

## 2021-06-15 MED ORDER — MIDAZOLAM HCL 2 MG/2ML IJ SOLN
INTRAMUSCULAR | Status: AC
  Filled 2021-06-15: qty 2

## 2021-06-15 MED ORDER — CEFAZOLIN SODIUM-DEXTROSE 2-4 GM/100ML-% IV SOLN
INTRAVENOUS | Status: AC | PRN
  Administered 2021-06-15: 2 g via INTRAVENOUS

## 2021-06-15 MED ORDER — GLUCAGON HCL RDNA (DIAGNOSTIC) 1 MG IJ SOLR
INTRAMUSCULAR | Status: AC | PRN
  Administered 2021-06-15: 1 mg via INTRAVENOUS

## 2021-06-15 NOTE — Progress Notes (Signed)
SLP Cancellation Note  Patient Details Name: [name] [name] MRN: [id number] DOB: 1961-07-01   Cancelled treatment:   NPO for PEG today. Will follow.  Channa Hazelett L. Samson Frederic, MA CCC/SLP Acute Rehabilitation Services Office number [phone] Pager [phone]         Blenda Mounts Laurice 06/15/2021, 10:09 AM

## 2021-06-15 NOTE — Progress Notes (Addendum)
ANTICOAGULATION CONSULT NOTE  Pharmacy Consult for Resuming Anticoagulants/Antiplatelet Medications Post-IR Procedure (SQ Heparin) Indication: VTE prophylaxis  No Known Allergies  Patient Measurements: Height: 5\' 9"  (175.3 cm) Weight: 59.7 kg (131 lb 9.8 oz) IBW/kg (Calculated) : 70.7  Vital Signs: Temp: 97.6 F (36.4 C) (06/21 1621) Temp Source: Axillary (06/21 1621) BP: 130/89 (06/21 1621) Pulse Rate: 86 (06/21 1621)  Labs: Recent Labs    06/13/21 1009 06/15/21 0747  HGB 12.6*  --   HCT 37.7*  --   PLT 344  --   CREATININE 0.58* 0.71    Estimated Creatinine Clearance: 84 mL/min (by C-G formula based on SCr of 0.71 mg/dL).   Medical History: Past Medical History:  Diagnosis Date   Alcohol abuse    Drug abuse (HCC)    Right rotator cuff tear    Seasonal allergies     Assessment: 60 yr old man suffered SAH secondary to rupture aneurysm on 5/25, S/P coil embolization on 5/26, VP shut on 6/14. Pt is awaiting SNF placement. He is S/P G tube placement this afternoon by IR (standard bleeding risk procedure, per consult information). Pharmacy is consulted to resume anticoagulants/antiplatelet medications post-IR, per [facility] protocol.  Pt has been receiving heparin 5000 units SQ Q 8 hrs (last dose at 2231 on 6/20). H/H 12.6/37.7, plt 344 (CBC stable). Per RN, no bleeding issues observed post-IR procedure today.  Goal of Therapy:  Prevention of VTE Monitor platelets by anticoagulation protocol: Yes   Plan:  Resume heparin 5000 units SQ Q 8 hrs this evening (at least 6 hrs after procedure), per [place] protocol Monitor CBC Monitor for bleeding  09-13-1975, PharmD, BCPS, [facility] Clinical Pharmacist 06/15/2021,4:33 PM

## 2021-06-15 NOTE — Progress Notes (Signed)
[facility] PROGRESS NOTE    Progress Note  [name] [name]  YKZ:[id number] DOB: November 23, 1961 DOA: 05/19/2021 PCP: Pcp, No     Brief Narrative:   [name] [name] is an 60 y.o. male past medical history past medical history of alcohol abuse and drug abuse who suffered a subarachnoid hemorrhage status post coiling of the anterior communicating aneurysm, underwent VP shunting on 06/09/2019  Significant Events: 5/25 Admitted with [facility] secondary to ruptured aneurysm, ventriculostomy preformed in ED, Ancef started 5/26 Coil Embolization 5/29 worsening neurological exam consistent with vasospasm, hemodynamic augmentation initiated. 6/1 worsening hyponatremia: Na of 120.  Patient started on salt tablets 1 g twice daily and fludrocortisone 0.1 mg. 6/2 BP goal reduced 160 -180.  Salt Tablet increased to 1 g tid. Fludrocortisone increased 0.2 mg. 6/8 Ongoing agitation, intermittently following commands. CT Head with worsening hydrocephalus despite EVD, open to 10 per NSGY. 6/9 EVD remains in place, repeat head CT as below 6/10-12, no significant neuro changes, intermittently agitated.  Ongoing EVD, plans for VP shunt next week 6/13 no neuro changes, remains intermittently restless 6/14 went for VP shunt, no neuro changes, afebrile  Awaiting skilled nursing facility placement.  Assessment/Plan:   Acute metabolic encephalopathy subarachnoid hemorrhage secondary to aneurysm rupture: With obstructive hydrocephalus status post EVD on 06/08/2021. Core track in place continue nutrition per NG tube remains in the dysphagia 1 diet.  Schedule for PEG tube placement on 06/15/2021. Continue aspiration precaution and seizure precautions.  He is currently on Keppra for seizure prophylaxis. Physical therapy evaluated the patient, recommended skilled nursing facility.  Hyponatremia suspect cerebral salt wasting syndrome: Resolved discontinue salt tablets.  Diarrhea: Continue loperamide.   Resolved  Physical deconditioning in the setting of subarachnoid hemorrhage: Will need to go to skilled nursing facility.  Nutrition: Continue nutrition per core track.  IR to place PEG on 06/16/2021. His creatinine is trending down potassium is borderline at 5 will increase free water per tube check a basic metabolic panel tomorrow morning.  Leukocytosis: Likely stress margination has remained afebrile, continue to hold antibiotics.    DVT prophylaxis: scd Family Communication:none Status is: Inpatient  Remains inpatient appropriate because:Hemodynamically unstable  Dispo: The patient is from: Home              Anticipated d/c is to: Skilled nursing facility              Patient currently is not medically stable to d/c.   Difficult to place patient No    Code Status:     Code Status Orders  (From admission, onward)           Start     Ordered   05/19/21 2022  Full code  Continuous        05/19/21 2022           Code Status History     Date Active Date Inactive Code Status Order ID Comments User Context   10/31/2016 2152 11/01/2016 1945 Full Code [id number]  Clifton Custard Inpatient   10/31/2016 1308 10/31/2016 2047 Full Code [id number]  Dionne Ano ED   06/13/2013 1417 06/13/2013 2022 Full Code [id number]  Emilia Beck, PA-C ED         IV Access:   Peripheral IV   Procedures and diagnostic studies:   CT ABDOMEN WO CONTRAST  Result Date: 06/14/2021 CLINICAL DATA:  Evaluate anatomy for percutaneous gastrostomy tube placement. History of intracranial aneurysm and hemorrhage. EXAM: CT ABDOMEN WITHOUT CONTRAST TECHNIQUE: Multidetector  CT imaging of the abdomen was performed following the standard protocol without IV contrast. COMPARISON:  None. FINDINGS: Lower chest: Few dependent densities at the right lung base. Otherwise, the lung bases are clear. There appears to be a small hiatal hernia. Hepatobiliary: Normal appearance of the  liver and gallbladder. No significant biliary dilatation. Pancreas: Unremarkable. No pancreatic ductal dilatation or surrounding inflammatory changes. Spleen: Normal in size without focal abnormality. Adrenals/Urinary Tract: Normal appearance of the adrenal glands. Normal appearance of both kidneys without stones or hydronephrosis. Stomach/Bowel: Nasogastric tube extends in the stomach and terminates near the duodenal bulb. The stomach is cephalad to the transverse colon. No significant bowel dilatation. No focal bowel inflammation. Vascular/Lymphatic: Atherosclerotic calcifications in the abdominal aorta without aneurysm. No significant lymph node enlargement in the abdomen. Other: VP shunt enters through the anterior right upper abdomen. There is a small amount of gas around the shunt tubing as it enters the abdominal cavity. There is also free air within the anterior upper abdomen likely secondary to the shunt placement. The shunt tubing coils around the right side of the abdomen and terminates in the mid upper abdomen adjacent to the mid transverse colon. No significant free fluid in the abdomen. Areas of subcutaneous edema in the anterior abdomen could be related to injection sites. Musculoskeletal: Sclerosis involving the anterior and mid L2 vertebral body is likely related to degenerative changes. No suspicious bone findings. IMPRESSION: 1. Stomach is located in the anterior upper abdomen and anatomy should be amendable for percutaneous gastrostomy tube placement. 2. Pneumoperitoneum which is likely secondary to recent placement of VP shunt. 3. Probable small hiatal hernia. Electronically Signed   By: Richarda Overlie M.D.   On: 06/14/2021 08:09     Medical Consultants:   None.   Subjective:    [name] [name] lethargic nonverbal  Objective:    Vitals:   06/14/21 2016 06/14/21 2334 06/15/21 0338 06/15/21 0831  BP: 118/82 116/83  (!) 140/96  Pulse: 88 92 87 85  Resp:  18 18 18   Temp: 97.7 F  (36.5 C) 98.1 F (36.7 C) 97.9 F (36.6 C) 97.9 F (36.6 C)  TempSrc: Oral Oral Oral Oral  SpO2: 100% 100% 100% 100%  Weight:      Height:       SpO2: 100 % O2 Flow Rate (L/min): 2 L/min   Intake/Output Summary (Last 24 hours) at 06/15/2021 0911 Last data filed at 06/14/2021 2356 Gross per 24 hour  Intake --  Output 700 ml  Net -700 ml    Filed Weights   06/09/21 0359 06/13/21 0500 06/14/21 0413  Weight: 60 kg 60.1 kg 59.7 kg    Exam: General exam: In no acute distress. Respiratory system: Good air movement and clear to auscultation. Cardiovascular system: S1 & S2 heard, RRR. No JVD. Gastrointestinal system: Abdomen is nondistended, soft and nontender.  Extremities: No pedal edema. Skin: No rashes, lesions or ulcers  Data Reviewed:    Labs: Basic Metabolic Panel: Recent Labs  Lab 06/11/21 0821 06/13/21 1009 06/15/21 0747  NA 145 143 144  K 3.9 3.8 5.0  CL 108 108 106  CO2 28 29 30   GLUCOSE 116* 102* 111*  BUN 15 16 16   CREATININE 0.64 0.58* 0.71  CALCIUM 9.8 9.3 9.8    GFR Estimated Creatinine Clearance: 84 mL/min (by C-G formula based on SCr of 0.71 mg/dL). Liver Function Tests: No results for input(s): AST, ALT, ALKPHOS, BILITOT, PROT, ALBUMIN in the last 168 hours. No results  for input(s): LIPASE, AMYLASE in the last 168 hours. No results for input(s): AMMONIA in the last 168 hours. Coagulation profile No results for input(s): INR, PROTIME in the last 168 hours. COVID-19 Labs  No results for input(s): DDIMER, FERRITIN, LDH, CRP in the last 72 hours.  Lab Results  Component Value Date   SARSCOV2NAA NEGATIVE 05/19/2021   SARSCOV2NAA NEGATIVE 05/10/2021   SARSCOV2NAA NEGATIVE 05/05/2021   SARSCOV2NAA NEGATIVE 10/24/2020    CBC: Recent Labs  Lab 06/11/21 0821 06/13/21 1009  WBC 6.3 6.2  NEUTROABS  --  4.1  HGB 12.5* 12.6*  HCT 39.1 37.7*  MCV 101.3* 99.7  PLT 411* 344    Cardiac Enzymes: No results for input(s): CKTOTAL, CKMB,  CKMBINDEX, TROPONINI in the last 168 hours. BNP (last 3 results) No results for input(s): PROBNP in the last 8760 hours. CBG: Recent Labs  Lab 06/14/21 1610 06/14/21 2022 06/14/21 2353 06/15/21 0440 06/15/21 0827  GLUCAP 124* 126* 125* 105* 108*    D-Dimer: No results for input(s): DDIMER in the last 72 hours. Hgb A1c: No results for input(s): HGBA1C in the last 72 hours. Lipid Profile: No results for input(s): CHOL, HDL, LDLCALC, TRIG, CHOLHDL, LDLDIRECT in the last 72 hours. Thyroid function studies: No results for input(s): TSH, T4TOTAL, T3FREE, THYROIDAB in the last 72 hours.  Invalid input(s): FREET3 Anemia work up: No results for input(s): VITAMINB12, FOLATE, FERRITIN, TIBC, IRON, RETICCTPCT in the last 72 hours. Sepsis Labs: Recent Labs  Lab 06/11/21 0821 06/13/21 1009  WBC 6.3 6.2    Microbiology Recent Results (from the past 240 hour(s))  Surgical PCR screen     Status: None   Collection Time: 06/08/21 11:33 AM   Specimen: Nasal Mucosa; Nasal Swab  Result Value Ref Range Status   MRSA, PCR NEGATIVE NEGATIVE Final   Staphylococcus aureus NEGATIVE NEGATIVE Final    Comment: (NOTE) The Xpert SA Assay (FDA approved for NASAL specimens in patients 81 years of age and older), is one component of a comprehensive surveillance program. It is not intended to diagnose infection nor to guide or monitor treatment. Performed at [facility], [address] [place], [name] [address]      Medications:    chlorhexidine  15 mL Mouth Rinse BID   Chlorhexidine Gluconate Cloth  6 each Topical Daily   feeding supplement  237 mL Oral BID BM   feeding supplement (PROSource TF)  45 mL Per Tube BID   fiber  1 packet Per Tube BID   free water  100 mL Per Tube Q8H   heparin injection (subcutaneous)  5,000 Units Subcutaneous Q8H   mouth rinse  15 mL Mouth Rinse q12n4p   sodium chloride  1 g Per Tube BID WC   Continuous Infusions:   ceFAZolin (ANCEF) IV      feeding supplement (JEVITY 1.5 CAL/FIBER) Stopped (06/15/21 0030)       LOS: 27 days   Marinda Elk  [facility]  06/15/2021, 9:11 AM

## 2021-06-15 NOTE — Procedures (Signed)
Pre procedure Dx: Dysphagia Post Procedure Dx: Same  Successful fluoroscopic guided insertion of gastrostomy tube.   The gastrostomy tube may be used immediately for medications.   Tube feeds may be initiated in 24 hours as per the primary team.    EBL: Minimal  Complications: None immediate  Jay Liv Rallis, MD Pager #: [phone]    

## 2021-06-15 NOTE — Plan of Care (Signed)
  Problem: Education: Goal: Knowledge of General Education information will improve Description: Including pain rating scale, medication(s)/side effects and non-pharmacologic comfort measures Outcome: Progressing   Problem: Health Behavior/Discharge Planning: Goal: Ability to manage health-related needs will improve Outcome: Progressing   Problem: Clinical Measurements: Goal: Ability to maintain clinical measurements within normal limits will improve Outcome: Progressing Goal: Will remain free from infection Outcome: Progressing Goal: Diagnostic test results will improve Outcome: Progressing Goal: Respiratory complications will improve Outcome: Progressing Goal: Cardiovascular complication will be avoided Outcome: Progressing   Problem: Coping: Goal: Level of anxiety will decrease Outcome: Progressing   Problem: Elimination: Goal: Will not experience complications related to bowel motility Outcome: Progressing Goal: Will not experience complications related to urinary retention Outcome: Progressing   Problem: Nutrition: Goal: Adequate nutrition will be maintained Outcome: Progressing   Problem: Pain Managment: Goal: General experience of comfort will improve Outcome: Progressing   Problem: Education: Goal: Knowledge of disease or condition will improve Outcome: Progressing Goal: Knowledge of secondary prevention will improve Outcome: Progressing Goal: Knowledge of patient specific risk factors addressed and post discharge goals established will improve Outcome: Progressing Goal: Individualized Educational Video(s) Outcome: Progressing   Problem: Skin Integrity: Goal: Risk for impaired skin integrity will decrease Outcome: Progressing   Problem: Safety: Goal: Ability to remain free from injury will improve Outcome: Progressing

## 2021-06-16 LAB — GLUCOSE, CAPILLARY
Glucose-Capillary: 104 mg/dL — ABNORMAL HIGH (ref 70–99)
Glucose-Capillary: 108 mg/dL — ABNORMAL HIGH (ref 70–99)
Glucose-Capillary: 110 mg/dL — ABNORMAL HIGH (ref 70–99)
Glucose-Capillary: 111 mg/dL — ABNORMAL HIGH (ref 70–99)
Glucose-Capillary: 132 mg/dL — ABNORMAL HIGH (ref 70–99)
Glucose-Capillary: 136 mg/dL — ABNORMAL HIGH (ref 70–99)
Glucose-Capillary: 143 mg/dL — ABNORMAL HIGH (ref 70–99)

## 2021-06-16 LAB — CBC
HCT: 39.6 % (ref 39.0–52.0)
Hemoglobin: 13.4 g/dL (ref 13.0–17.0)
MCH: 33.3 pg (ref 26.0–34.0)
MCHC: 33.8 g/dL (ref 30.0–36.0)
MCV: 98.5 fL (ref 80.0–100.0)
Platelets: 275 10*3/uL (ref 150–400)
RBC: 4.02 MIL/uL — ABNORMAL LOW (ref 4.22–5.81)
RDW: 12.1 % (ref 11.5–15.5)
WBC: 10.2 10*3/uL (ref 4.0–10.5)
nRBC: 0 % (ref 0.0–0.2)

## 2021-06-16 NOTE — Progress Notes (Addendum)
  Speech Language Pathology Treatment: Dysphagia; Patient Details Name: [name] [name] MRN: [id number] DOB: Jul 21, 1961 Today's Date: 06/16/2021 Time: 1500-1520 SLP Time Calculation (min) (ACUTE ONLY): 20 min  Assessment / Plan / Recommendation Clinical Impression  Improved attention and processing time as pt up in chair when therapist arrived. His response time to questions quicker. Most responses were general in nature without much communicative intent. Was not oriented to hospital or able to verbalize areas needed for improvement.  He initiated teeth brushing but extremely vigorously due to difficulty with motor control. He He reached for cup with assist to bring to mouth for sips water. Self fed 2 bites pudding. No s/s aspiration. Therapist wrote order to re-initiate puree/thin, meds whole in puree and discussed with RN he will need full assist due to cognition.    HPI HPI: Pt is a 86 you w/ PMH sinusitis, rotator cuff tear, tobacco, marijuana, and ETOH use, remote hx of crack cocaine use who is being managed for parenchymal hemorrhage within the anterior  left frontal lobe. Mild surrounding edema within the anterior left  frontal lobe, now s/p ventriculostomy. Ventric removed and shunt placed 6/14.      SLP Plan  Continue with current plan of care       Recommendations  Diet recommendations: Dysphagia 1 (puree);Thin liquid Liquids provided via: Cup;Straw Medication Administration: Whole meds with puree Supervision: Full supervision/cueing for compensatory strategies;Staff to assist with self feeding Compensations: Minimize environmental distractions;Slow rate;Small sips/bites Postural Changes and/or Swallow Maneuvers: Seated upright 90 degrees                Oral Care Recommendations: Oral care QID Follow up Recommendations: Skilled Nursing facility SLP Visit Diagnosis: Dysphagia, unspecified (R13.10);Cognitive communication deficit (N82.956) Plan: Continue with current plan  of care       GO                Royce Macadamia 06/16/2021, 3:53 PM  Breck Coons Lonell Face.Ed Nurse, children's [phone] Office [phone]

## 2021-06-16 NOTE — Plan of Care (Signed)
  Problem: Clinical Measurements: Goal: Ability to maintain clinical measurements within normal limits will improve Outcome: Progressing Goal: Will remain free from infection Outcome: Progressing Goal: Diagnostic test results will improve Outcome: Progressing Goal: Respiratory complications will improve Outcome: Progressing Goal: Cardiovascular complication will be avoided Outcome: Progressing   Problem: Activity: Goal: Risk for activity intolerance will decrease Outcome: Progressing   Problem: Nutrition: Goal: Adequate nutrition will be maintained Outcome: Progressing   Problem: Coping: Goal: Level of anxiety will decrease Outcome: Progressing   Problem: Elimination: Goal: Will not experience complications related to urinary retention Outcome: Progressing   Problem: Pain Managment: Goal: General experience of comfort will improve Outcome: Progressing   Problem: Safety: Goal: Ability to remain free from injury will improve Outcome: Progressing   Problem: Skin Integrity: Goal: Risk for impaired skin integrity will decrease Outcome: Progressing   Problem: Safety: Goal: Non-violent Restraint(s) Outcome: Progressing

## 2021-06-16 NOTE — Progress Notes (Signed)
IR.  Patient underwent an image-guided percutaneous gastrostomy tube placement in IR 06/15/2021.  Patient evaluated bedside today. Resting comfortably. Opens eyes to voice but nonverbal ?baseline. Mother at bedside.  Gastrostomy tube site soft with minimal amount of dried blood under bumper, no active bleeding/drainage, erythema, or hematoma noted; bumper cinched to skin.  Gastrostomy tube ready for full use at this time- Ty, RN made aware. Please ensure bumper remains cinched to skin to prevent leakage. Further plans per [facility]- appreciate and agree with management. Please call IR with questions/concerns.   Waylan Boga Audri Kozub, PA-C 06/16/2021, 10:51 AM

## 2021-06-16 NOTE — TOC Progression Note (Addendum)
Transition of Care [facility]) - Progression Note    Patient Details  Name: [name] [name] MRN: [id number] Date of Birth: September 11, 1961  Transition of Care [facility]) CM/SW Contact  Mearl Latin, LCSW Phone Number: 06/16/2021, 9:54 AM  Clinical Narrative:    9:54am-CSW requested [facility] review patient, as they are in network with [facility].   3:34pm-[facility] unable to accept patient due to VP Shunt. CSW faxed referral to [facility].    Expected Discharge Plan: Skilled Nursing Facility Barriers to Discharge: Continued Medical Work up  Expected Discharge Plan and Services Expected Discharge Plan: Skilled Nursing Facility In-house Referral: Clinical Social Work   Post Acute Care Choice: IP Rehab, Skilled Nursing Facility Living arrangements for the past 2 months: Apartment                                       Social Determinants of Health (SDOH) Interventions    Readmission Risk Interventions No flowsheet data found.

## 2021-06-16 NOTE — Progress Notes (Signed)
Occupational Therapy Treatment Patient Details Name: [name] [name] MRN: [id number] DOB: Aug 16, 1961 Today's Date: 06/16/2021    History of present illness 60 y.o. M who was admitted to ICU 05/19/21 with IVH with large acute SAH now s/p ventriculostomy. 5/26 coil embolization. 5/29 worsening neurological exam consistent with vasospasm. VP shunt placement 6/14. Significant PMH: rotator cuff tear, tobacco, marijuana and ETOH use, remote hx crack cocaine use.   OT comments  Patient with incremental gains toward patient focused OT goals.  Patient continues with minimal verbalizations, and poor initiation of tasks requested.  He continues to be Max A +2 for basic mobility and near Max to Total assist for ADL tasks.  Co-trak was removed and feeding tube placed. SNF continues to be recommended given 24 hour heavy assist needed for post acute rehab trial.  OT will continue to follow in the acute setting to maximize his functional status.    Follow Up Recommendations  SNF    Equipment Recommendations  Wheelchair (measurements OT);Wheelchair cushion (measurements OT);Hospital bed    Recommendations for Other Services      Precautions / Restrictions Precautions Precautions: Fall;Other (comment) Precaution Comments: safety mitts, tube feedings Restrictions Weight Bearing Restrictions: No       Mobility Bed Mobility Overal bed mobility: Needs Assistance Bed Mobility: Supine to Sit   Sidelying to sit: Total assist;+2 for physical assistance Supine to sit: Max assist;+2 for physical assistance     General bed mobility comments: max +2 for trunk and LE management, scooting to EOB with assist of bed pads. Verbal cuing for sequencing task, not helpful for this pt. Patient Response: Flat affect  Transfers Overall transfer level: Needs assistance Equipment used: 2 person hand held assist Transfers: Sit to/from [employer] Sit to Stand: Max assist;+2 physical assistance Stand  pivot transfers: Max assist;+2 physical assistance       General transfer comment: Max +2 assist for power up, rise, steady, and pivot to recliner. Pt keeps LEs and hips in rigid extension, difficult to break into flexion for stand>sit. STS x2.    Balance Overall balance assessment: Needs assistance Sitting-balance support: Feet supported;No upper extremity supported Sitting balance-Leahy Scale: Zero Sitting balance - Comments: heavy posterior bias Postural control: Posterior lean Standing balance support: Bilateral upper extremity supported;During functional activity Standing balance-Leahy Scale: Zero Standing balance comment: max +2                           ADL either performed or assessed with clinical judgement   ADL       Grooming: Wash/dry face;Sitting;Wash/dry hands;Moderate assistance Grooming Details (indicate cue type and reason): for thoroughness.  Washcloth laid in his hads, spontaneously washed hands and wiped face and mouth.             Lower Body Dressing: Total assistance;Sitting/lateral leans                                         Cognition Arousal/Alertness: Awake/alert Behavior During Therapy: Flat affect Overall Cognitive Status: Impaired/Different from baseline Area of Impairment: Orientation;Attention;Following commands;Safety/judgement;Awareness;Problem solving                 Orientation Level: Disoriented to;Place;Time;Situation Current Attention Level: Focused   Following Commands: Follows one step commands inconsistently Safety/Judgement: Decreased awareness of safety;Decreased awareness of deficits Awareness: Intellectual Problem Solving: Decreased initiation;Difficulty sequencing;Requires verbal  cues;Requires tactile cues;Slow processing General Comments: Pt states his name is "[name]", other verbalizations are mostly explicit. Pt given max cues to complete functional tasks (wipe your face, put your socks  on), does not do these tasks when cued to do so but does do some functional tasks spontaneously. Follows little to no commands, resistant to some mobility        Exercises     Shoulder Instructions       General Comments      Pertinent Vitals/ Pain       Faces - no pain                                                          Frequency  Min 2X/week        Progress Toward Goals  OT Goals(current goals can now be found in the care plan section)     Acute Rehab OT Goals Patient Stated Goal: unable to state OT Goal Formulation: Patient unable to participate in goal setting Potential to Achieve Goals: Good  Plan Discharge plan needs to be updated    Co-evaluation    PT/OT/SLP Co-Evaluation/Treatment: Yes Reason for Co-Treatment: Complexity of the patient's impairments (multi-system involvement);For patient/therapist safety PT goals addressed during session: Mobility/safety with mobility;Balance        AM-PAC OT "6 Clicks" Daily Activity     Outcome Measure   Help from another person eating meals?: Total Help from another person taking care of personal grooming?: A Lot Help from another person toileting, which includes using toliet, bedpan, or urinal?: Total Help from another person bathing (including washing, rinsing, drying)?: Total Help from another person to put on and taking off regular upper body clothing?: Total Help from another person to put on and taking off regular lower body clothing?: Total 6 Click Score: 7    End of Session Equipment Utilized During Treatment: Gait belt  OT Visit Diagnosis: Unsteadiness on feet (R26.81);Muscle weakness (generalized) (M62.81);Other symptoms and signs involving cognitive function   Activity Tolerance Patient tolerated treatment well   Patient Left in chair;with call bell/phone within reach;with chair alarm set   Nurse Communication Mobility status        Time: 1610-9604 OT Time  Calculation (min): 23 min  Charges: OT General Charges $OT Visit: 1 Visit OT Treatments $Self Care/Home Management : 8-22 mins  06/16/2021  Rich, OTR/L  Acute Rehabilitation Services  Office:  [phone]    Suzanna Obey 06/16/2021, 3:15 PM

## 2021-06-16 NOTE — Progress Notes (Signed)
[facility] PROGRESS NOTE    Progress Note  [name] [name]  LNZ:[id number] DOB: [birth year] DOA: 05/19/2021 PCP: Pcp, No     Brief Narrative:   [name] [name] is an 60 y.o. male past medical history past medical history of alcohol abuse and drug abuse who suffered a subarachnoid hemorrhage status post coiling of the anterior communicating aneurysm, underwent VP shunting on 06/09/2019  Significant Events: 5/25 Admitted with [facility] secondary to ruptured aneurysm, ventriculostomy preformed in ED, Ancef started 5/26 Coil Embolization 5/29 worsening neurological exam consistent with vasospasm, hemodynamic augmentation initiated. 6/1 worsening hyponatremia: Na of 120.  Patient started on salt tablets 1 g twice daily and fludrocortisone 0.1 mg. 6/2 BP goal reduced 160 -180.  Salt Tablet increased to 1 g tid. Fludrocortisone increased 0.2 mg. 6/8 Ongoing agitation, intermittently following commands. CT Head with worsening hydrocephalus despite EVD, open to 10 per NSGY. 6/9 EVD remains in place, repeat head CT as below 6/10-12, no significant neuro changes, intermittently agitated.  Ongoing EVD, plans for VP shunt next week 6/13 no neuro changes, remains intermittently restless 6/14 went for VP shunt, no neuro changes, afebrile 6/21 status post PEG tube placement nutrition started 24 hours postplacement.  Awaiting skilled nursing facility placement.  Assessment/Plan:   Acute metabolic encephalopathy subarachnoid hemorrhage secondary to aneurysm rupture: With obstructive hydrocephalus status post EVD on 06/08/2021. Not able to tolerate his orals. PEG tube placement on 06/15/2021. Continue aspiration precaution and seizure precautions.  He is currently on Keppra for seizure prophylaxis. Physical therapy evaluated the patient, recommended skilled nursing facility.  Hyponatremia suspect cerebral salt wasting syndrome: Resolved discontinue salt tablets.  Diarrhea: Continue loperamide.   Resolved  Physical deconditioning in the setting of subarachnoid hemorrhage: Will need to go to skilled nursing facility.  Nutrition: Continue nutrition per core track.  IR to place PEG on 06/16/2021. His creatinine is trending down potassium is borderline at 5 will increase free water per tube check a basic metabolic panel tomorrow morning.  Leukocytosis: Likely stress margination has remained afebrile, continue to hold antibiotics.    DVT prophylaxis: scd Family Communication:none Status is: Inpatient  Remains inpatient appropriate because:Hemodynamically unstable  Dispo: The patient is from: Home              Anticipated d/c is to: Skilled nursing facility              Patient currently is not medically stable to d/c.   Difficult to place patient No    Code Status:     Code Status Orders  (From admission, onward)           Start     Ordered   05/19/21 2022  Full code  Continuous        05/19/21 2022           Code Status History     Date Active Date Inactive Code Status Order ID Comments User Context   10/31/2016 2152 11/01/2016 1945 Full Code [id number]  Clifton Custard Inpatient   10/31/2016 1308 10/31/2016 2047 Full Code [id number]  Dionne Ano ED   06/13/2013 1417 06/13/2013 2022 Full Code [id number]  Emilia Beck, PA-C ED         IV Access:   Peripheral IV   Procedures and diagnostic studies:   IR GASTROSTOMY TUBE MOD SED  Result Date: 06/15/2021 INDICATION: Dysphagia second to subarachnoid hemorrhage post coiling of A-comm aneurysm and VP shunt placement. Please perform percutaneous gastrostomy tube placement for enteric  nutrition supplementation purposes. EXAM: PULL TROUGH GASTROSTOMY TUBE PLACEMENT COMPARISON:  None. MEDICATIONS: Ancef 2 gm IV; Antibiotics were administered within 1 hour of the procedure. Glucagon 1 mg IV CONTRAST:  15 cc of Omnipaque 300 administered into the gastric lumen. ANESTHESIA/SEDATION:  Moderate (conscious) sedation was employed during this procedure. A total of Versed 1 mg and Fentanyl 50 mcg was administered intravenously. Moderate Sedation Time: 33 minutes. The patient's level of consciousness and vital signs were monitored continuously by radiology nursing throughout the procedure under my direct supervision. FLUOROSCOPY TIME:  6 minutes, 48 seconds (46 mGy) COMPLICATIONS: None immediate. PROCEDURE: Informed written consent was obtained from the patient's family following explanation of the procedure, risks, benefits and alternatives. A time out was performed prior to the initiation of the procedure. Ultrasound scanning was performed to demarcate the edge of the left lobe of the liver. Maximal barrier sterile technique utilized including caps, mask, sterile gowns, sterile gloves, large sterile drape, hand hygiene and Betadine prep. The left upper quadrant was sterilely prepped and draped. An oral gastric catheter was inserted into the stomach under fluoroscopy. The existing nasogastric feeding tube was removed. The left costal margin and air opacified transverse colon were identified and avoided. Air was injected into the stomach for insufflation and visualization under fluoroscopy. Under sterile conditions a 17 gauge trocar needle was utilized to access the stomach percutaneously beneath the left subcostal margin after the overlying soft tissues were anesthetized with 1% Lidocaine with epinephrine. Special attention was made to avoid the tip of the ventriculoperitoneal catheter tubing. Needle position was confirmed within the stomach with aspiration of air and injection of small amount of contrast. A single T tack was deployed for gastropexy. Over an Amplatz guide wire, a 9-French sheath was inserted into the stomach. A snare device was utilized to capture the oral gastric catheter. The snare device was pulled retrograde from the stomach up the esophagus and out the oropharynx. The 20-French  pull-through gastrostomy was connected to the snare device and pulled antegrade through the oropharynx down the esophagus into the stomach and then through the percutaneous tract external to the patient. The gastrostomy was assembled externally. Contrast injection confirms appropriate positioning within the stomach. Several spot radiographic images were obtained in various obliquities for documentation. Dressings were applied. The patient tolerated procedure well without immediate post procedural complication. FINDINGS: After successful fluoroscopic guided placement, the gastrostomy tube is appropriately positioned with internal disc positioned against the inner ventral wall of the gastric lumen. IMPRESSION: Successful fluoroscopic insertion of a 20-French pull-through gastrostomy tube. The gastrostomy may be used immediately for medication administration and in 24 hrs for the initiation of feeds. Electronically Signed   By: Simonne Come M.D.   On: 06/15/2021 16:38     Medical Consultants:   None.   Subjective:    [name] [name] lethargic nonverbal  Objective:    Vitals:   06/15/21 2015 06/15/21 2342 06/16/21 0335 06/16/21 0754  BP: 132/90 (!) 128/93 (!) 135/92 (!) 153/92  Pulse: 79 98 100 92  Resp: 16 18 20 16   Temp: 97.6 F (36.4 C) 98.5 F (36.9 C) 97.8 F (36.6 C) 97.8 F (36.6 C)  TempSrc: Axillary Oral Oral Oral  SpO2: 100% 100% 99% 100%  Weight:   56.1 kg   Height:       SpO2: 100 % O2 Flow Rate (L/min): 2 L/min   Intake/Output Summary (Last 24 hours) at 06/16/2021 0939 Last data filed at 06/15/2021 1540 Gross per 24 hour  Intake 100 ml  Output --  Net 100 ml    Filed Weights   06/13/21 0500 06/14/21 0413 06/16/21 0335  Weight: 60.1 kg 59.7 kg 56.1 kg    Exam: General exam: In no acute distress. Respiratory system: Good air movement and clear to auscultation. Cardiovascular system: S1 & S2 heard, RRR. No JVD. Gastrointestinal system: Abdomen is nondistended,  soft and nontender.  Extremities: No pedal edema. Skin: No rashes, lesions or ulcers  Data Reviewed:    Labs: Basic Metabolic Panel: Recent Labs  Lab 06/11/21 0821 06/13/21 1009 06/15/21 0747  NA 145 143 144  K 3.9 3.8 5.0  CL 108 108 106  CO2 28 29 30   GLUCOSE 116* 102* 111*  BUN 15 16 16   CREATININE 0.64 0.58* 0.71  CALCIUM 9.8 9.3 9.8    GFR Estimated Creatinine Clearance: 78.9 mL/min (by C-G formula based on SCr of 0.71 mg/dL). Liver Function Tests: No results for input(s): AST, ALT, ALKPHOS, BILITOT, PROT, ALBUMIN in the last 168 hours. No results for input(s): LIPASE, AMYLASE in the last 168 hours. No results for input(s): AMMONIA in the last 168 hours. Coagulation profile No results for input(s): INR, PROTIME in the last 168 hours. COVID-19 Labs  No results for input(s): DDIMER, FERRITIN, LDH, CRP in the last 72 hours.  Lab Results  Component Value Date   SARSCOV2NAA NEGATIVE 05/19/2021   SARSCOV2NAA NEGATIVE 05/10/2021   SARSCOV2NAA NEGATIVE 05/05/2021   SARSCOV2NAA NEGATIVE 10/24/2020    CBC: Recent Labs  Lab 06/11/21 0821 06/13/21 1009 06/16/21 0340  WBC 6.3 6.2 10.2  NEUTROABS  --  4.1  --   HGB 12.5* 12.6* 13.4  HCT 39.1 37.7* 39.6  MCV 101.3* 99.7 98.5  PLT 411* 344 275    Cardiac Enzymes: No results for input(s): CKTOTAL, CKMB, CKMBINDEX, TROPONINI in the last 168 hours. BNP (last 3 results) No results for input(s): PROBNP in the last 8760 hours. CBG: Recent Labs  Lab 06/15/21 1618 06/15/21 2052 06/16/21 0017 06/16/21 0413 06/16/21 0750  GLUCAP 176* 90 108* 104* 111*    D-Dimer: No results for input(s): DDIMER in the last 72 hours. Hgb A1c: No results for input(s): HGBA1C in the last 72 hours. Lipid Profile: No results for input(s): CHOL, HDL, LDLCALC, TRIG, CHOLHDL, LDLDIRECT in the last 72 hours. Thyroid function studies: No results for input(s): TSH, T4TOTAL, T3FREE, THYROIDAB in the last 72 hours.  Invalid input(s):  FREET3 Anemia work up: No results for input(s): VITAMINB12, FOLATE, FERRITIN, TIBC, IRON, RETICCTPCT in the last 72 hours. Sepsis Labs: Recent Labs  Lab 06/11/21 0821 06/13/21 1009 06/16/21 0340  WBC 6.3 6.2 10.2    Microbiology Recent Results (from the past 240 hour(s))  Surgical PCR screen     Status: None   Collection Time: 06/08/21 11:33 AM   Specimen: Nasal Mucosa; Nasal Swab  Result Value Ref Range Status   MRSA, PCR NEGATIVE NEGATIVE Final   Staphylococcus aureus NEGATIVE NEGATIVE Final    Comment: (NOTE) The Xpert SA Assay (FDA approved for NASAL specimens in patients 19 years of age and older), is one component of a comprehensive surveillance program. It is not intended to diagnose infection nor to guide or monitor treatment. Performed at [facility], [address] [place], [address] [place]      Medications:    chlorhexidine  15 mL Mouth Rinse BID   Chlorhexidine Gluconate Cloth  6 each Topical Daily   feeding supplement  237 mL Oral BID BM  feeding supplement (PROSource TF)  45 mL Per Tube BID   fiber  1 packet Per Tube BID   free water  200 mL Per Tube Q6H   heparin injection (subcutaneous)  5,000 Units Subcutaneous Q8H   mouth rinse  15 mL Mouth Rinse q12n4p   sodium chloride  1 g Per Tube BID WC   Continuous Infusions:  feeding supplement (JEVITY 1.5 CAL/FIBER) Stopped (06/15/21 0030)       LOS: 28 days   Marinda Elk  [facility]  06/16/2021, 9:39 AM

## 2021-06-16 NOTE — Progress Notes (Signed)
Physical Therapy Treatment Patient Details Name: [name] [name] MRN: [id number] DOB: [birth year] Today's Date: 06/16/2021    History of Present Illness 60 y.o. M who was admitted to ICU 05/19/21 with IVH with large acute SAH now s/p ventriculostomy. 5/26 coil embolization. 5/29 worsening neurological exam consistent with vasospasm. VP shunt placement 6/14. Significant PMH: rotator cuff tear, tobacco, marijuana and ETOH use, remote hx crack cocaine use.    PT Comments    Pt conversive with PT and OT upon arrival to room, some unintelligible and explicit. Pt soiled in urine upon arrival to room, requiring assist for pericare and removing soiled brief. Pt overall requiring max +2 assist for mobility given significant difficulty with command following, at times pt is resistant to mobility. Pt with periods of decreased responsiveness during session, but with loud noise or PT waving hand over pt's face, pt would startle and come to. Pt up in chair resting comfortably with handmitts donned. PT to continue to follow acutely.      Follow Up Recommendations  SNF     Equipment Recommendations  Wheelchair (measurements PT);Wheelchair cushion (measurements PT);Hospital bed;3in1 (PT);Other (comment)    Recommendations for Other Services       Precautions / Restrictions Precautions Precautions: Fall;Other (comment) Precaution Comments: safety mitts, cortrak Restrictions Weight Bearing Restrictions: No    Mobility  Bed Mobility Overal bed mobility: Needs Assistance Bed Mobility: Supine to Sit;Sit to Supine     Supine to sit: Max assist;+2 for physical assistance     General bed mobility comments: max +2 for trunk and LE management, scooting to EOB with assist of bed pads. Verbal cuing for sequencing task, not helpful for this pt.    Transfers Overall transfer level: Needs assistance Equipment used: 2 person hand held assist Transfers: Sit to/from [employer] Sit to  Stand: Max assist;+2 physical assistance Stand pivot transfers: Max assist;+2 physical assistance       General transfer comment: Max +2 assist for power up, rise, steady, and pivot to recliner. Pt keeps LEs and hips in rigid extension, difficult to break into flexion for stand>sit. STS x2.  Ambulation/Gait                 Stairs             Wheelchair Mobility    Modified Rankin (Stroke Patients Only) Modified Rankin (Stroke Patients Only) Pre-Morbid Rankin Score: No symptoms Modified Rankin: Severe disability     Balance Overall balance assessment: Needs assistance Sitting-balance support: Feet supported;No upper extremity supported Sitting balance-Leahy Scale: Zero Sitting balance - Comments: heavy posterior bias Postural control: Posterior lean Standing balance support: Bilateral upper extremity supported;During functional activity Standing balance-Leahy Scale: Zero Standing balance comment: max +2                            Cognition Arousal/Alertness: Awake/alert Behavior During Therapy: Flat affect Overall Cognitive Status: Impaired/Different from baseline Area of Impairment: Orientation;Attention;Following commands;Safety/judgement;Awareness;Problem solving                 Orientation Level: Disoriented to;Place;Time;Situation Current Attention Level: Focused   Following Commands: Follows one step commands inconsistently Safety/Judgement: Decreased awareness of safety;Decreased awareness of deficits Awareness: Intellectual Problem Solving: Decreased initiation;Difficulty sequencing;Requires verbal cues;Requires tactile cues;Slow processing General Comments: Pt states his name is "[name] [name]", other verbalizations are mostly explicit. Pt given max cues to complete functional tasks (wipe your face, put your socks on), does not do  these tasks when cued to do so but does do some functional tasks spontaneously. Follows little to no commands,  resistant to some mobility      Exercises      General Comments        Pertinent Vitals/Pain Pain Assessment: Faces Faces Pain Scale: Hurts even more Pain Location: generalized, when standing (possibly feet?) Pain Descriptors / Indicators: Grimacing Pain Intervention(s): Limited activity within patient's tolerance;Monitored during session;Repositioned    Home Living                      Prior Function            PT Goals (current goals can now be found in the care plan section) Acute Rehab PT Goals Patient Stated Goal: unable to state PT Goal Formulation: With patient/family Time For Goal Achievement: 06/28/21 Potential to Achieve Goals: Good Progress towards PT goals: Progressing toward goals    Frequency    Min 3X/week      PT Plan Current plan remains appropriate    Co-evaluation PT/OT/SLP Co-Evaluation/Treatment: Yes Reason for Co-Treatment: For patient/therapist safety;To address functional/ADL transfers PT goals addressed during session: Mobility/safety with mobility;Balance        AM-PAC PT "6 Clicks" Mobility   Outcome Measure  Help needed turning from your back to your side while in a flat bed without using bedrails?: A Lot Help needed moving from lying on your back to sitting on the side of a flat bed without using bedrails?: A Lot Help needed moving to and from a bed to a chair (including a wheelchair)?: Total Help needed standing up from a chair using your arms (e.g., wheelchair or bedside chair)?: Total Help needed to walk in hospital room?: Total Help needed climbing 3-5 steps with a railing? : Total 6 Click Score: 8    End of Session   Activity Tolerance: Patient tolerated treatment well Patient left: in bed;with call bell/phone within reach;with bed alarm set Nurse Communication: Mobility status PT Visit Diagnosis: Unsteadiness on feet (R26.81);Muscle weakness (generalized) (M62.81);Difficulty in walking, not elsewhere  classified (R26.2);Other abnormalities of gait and mobility (R26.89)     Time: 2585-2778 PT Time Calculation (min) (ACUTE ONLY): 27 min  Charges:  $Therapeutic Activity: 8-22 mins                     Marye Round, PT DPT Acute Rehabilitation Services Pager [phone]  Office [phone]    Truddie Coco 06/16/2021, 2:37 PM

## 2021-06-17 DIAGNOSIS — R197 Diarrhea, unspecified: Secondary | ICD-10-CM

## 2021-06-17 LAB — GLUCOSE, CAPILLARY
Glucose-Capillary: 113 mg/dL — ABNORMAL HIGH (ref 70–99)
Glucose-Capillary: 123 mg/dL — ABNORMAL HIGH (ref 70–99)
Glucose-Capillary: 127 mg/dL — ABNORMAL HIGH (ref 70–99)
Glucose-Capillary: 132 mg/dL — ABNORMAL HIGH (ref 70–99)
Glucose-Capillary: 138 mg/dL — ABNORMAL HIGH (ref 70–99)
Glucose-Capillary: 142 mg/dL — ABNORMAL HIGH (ref 70–99)

## 2021-06-17 LAB — BASIC METABOLIC PANEL
Anion gap: 6 (ref 5–15)
BUN: 20 mg/dL (ref 6–20)
CO2: 28 mmol/L (ref 22–32)
Calcium: 9.5 mg/dL (ref 8.9–10.3)
Chloride: 109 mmol/L (ref 98–111)
Creatinine, Ser: 0.56 mg/dL — ABNORMAL LOW (ref 0.61–1.24)
GFR, Estimated: >60 mL/min (ref >60)
Glucose, Bld: 124 mg/dL — ABNORMAL HIGH (ref 70–99)
Potassium: 3.5 mmol/L (ref 3.5–5.1)
Sodium: 143 mmol/L (ref 135–145)

## 2021-06-17 MED ORDER — JEVITY 1.2 CAL PO LIQD
1560.0000 mL | ORAL | Status: AC
  Administered 2021-06-17 – 2021-06-24 (×6): 1560 mL
  Filled 2021-06-17: qty 2000

## 2021-06-17 MED ORDER — FREE WATER
100.0000 mL | Status: DC
  Administered 2021-06-17 – 2021-07-05 (×109): 100 mL

## 2021-06-17 NOTE — Plan of Care (Signed)
  Problem: Clinical Measurements: Goal: Ability to maintain clinical measurements within normal limits will improve Outcome: Progressing Goal: Will remain free from infection Outcome: Progressing Goal: Diagnostic test results will improve Outcome: Progressing Goal: Respiratory complications will improve Outcome: Progressing Goal: Cardiovascular complication will be avoided Outcome: Progressing   Problem: Nutrition: Goal: Adequate nutrition will be maintained Outcome: Progressing   Problem: Coping: Goal: Level of anxiety will decrease Outcome: Progressing   Problem: Elimination: Goal: Will not experience complications related to urinary retention Outcome: Progressing   Problem: Pain Managment: Goal: General experience of comfort will improve Outcome: Progressing   Problem: Safety: Goal: Ability to remain free from injury will improve Outcome: Progressing   Problem: Skin Integrity: Goal: Risk for impaired skin integrity will decrease Outcome: Progressing   Problem: Safety: Goal: Non-violent Restraint(s) Outcome: Progressing

## 2021-06-17 NOTE — Progress Notes (Signed)
Nutrition Follow-up  DOCUMENTATION CODES:   Not applicable  INTERVENTION:  -d/c Ensure -Continue to provide continuous TF as follows: Jevity 1.2 at 65 ml/hr (1560 ml per day) 45 ml PROSource TF BID  free water Q4H  Provides 1907 kcal, 109 gm protein, 1259 ml free water daily ( total free water daily)  NUTRITION DIAGNOSIS:   Inadequate oral intake related to inability to eat as evidenced by NPO status.  Being addressed via TF  GOAL:   Patient will meet greater than or equal to 90% of their needs  Being addressed  MONITOR:   Diet advancement, Labs, Weight trends, TF tolerance, Skin, I & O's  REASON FOR ASSESSMENT:   Other (Comment)    ASSESSMENT:   Pt is a 60 you w/ PMH sinusitis, rotator cuff tear, tobacco, marijuana, and ETOH use, remote hx of crack cocaine use who is being managed for intraventricular hemorrhage w/ large acute SAH now s/p ventriculostomy.  5/27 cortrak placed 6/14 VP shunt 6/21 s/p PEG placement  Pt pending SNF placement. Pt lethargic upon examination. Discussed pt with RN. Although pt has dysphagia 1 diet ordered, po intake has been minimal, so TF has been meeting 100% of pt's nutritional needs. Per RN, pt has been refusing Ensure. Tolerating Jevity 1.5 at 70 ml/hr via PEG w/ 57ml Prosource TF BID and free water Q6H.   Admit wt 63.5 kg Current wt 57 kg  1x unmeasured urinary occurrence x24 hours  Medications: sodium chloride tabs, nutrisource fiber Labs reviewed. CBGs [phone]  Diet Order:   Diet Order             DIET - DYS 1 Room service appropriate? No; Fluid consistency: Thin  Diet effective now                   EDUCATION NEEDS:   Education needs have been addressed  Skin:  Skin Assessment: Skin Integrity Issues: Skin Integrity Issues:: Other (Comment), Incisions Incisions: head, abdomen Other: MASD scrotum  Last BM:  6/19  Height:   Ht Readings from Last 1 Encounters:  05/20/21 5\' 9"  (1.753  m)    Weight:   Wt Readings from Last 1 Encounters:  06/17/21 57 kg    Ideal Body Weight:  72.7 kg  BMI:  Body mass index is 18.56 kg/m.  Estimated Nutritional Needs:   Kcal:  1800-2100 kcals  Protein:  90-110 g  Fluid:  >/= 1.8 L  06/19/21, MS, RD, LDN (she/her/hers) RD pager number and weekend/on-call pager number located in Amion.

## 2021-06-17 NOTE — Progress Notes (Signed)
  Speech Language Pathology Treatment: Dysphagia  Patient Details Name: [name] [name] MRN: [id number] DOB: 1961/04/06 Today's Date: 06/17/2021 Time: 1884-1660 SLP Time Calculation (min) (ACUTE ONLY): 12 min  Assessment / Plan / Recommendation Clinical Impression  Therapist assisted pt with self feeding breakfast due to cognitive impairments. Frequent hand over hand needed to initiate feeding given significant distractibility fading to slight tap on hand and verbal prompt. Attention and initiation have improved a great deal since admission. Consumed approximately 10% meal. From a loaded spoon he would take 1/3-1/2 at a time. Oral phase and pharyngeal phases were functional without indications of aspiration. No pocketing. Therapist will follow for upgraded textures as he is able- for now puree is appropriate.    HPI HPI: Pt is a 56 you w/ PMH sinusitis, rotator cuff tear, tobacco, marijuana, and ETOH use, remote hx of crack cocaine use who is being managed for parenchymal hemorrhage within the anterior  left frontal lobe. Mild surrounding edema within the anterior left  frontal lobe, now s/p ventriculostomy. Ventric removed and shunt placed 6/14.      SLP Plan  Continue with current plan of care       Recommendations  Diet recommendations: Dysphagia 1 (puree);Thin liquid Liquids provided via: Cup;Straw Medication Administration: Crushed with puree Supervision: Full supervision/cueing for compensatory strategies;Staff to assist with self feeding Compensations: Minimize environmental distractions;Slow rate;Small sips/bites Postural Changes and/or Swallow Maneuvers: Seated upright 90 degrees                Oral Care Recommendations: Oral care QID;Oral care BID Follow up Recommendations: Skilled Nursing facility SLP Visit Diagnosis: Dysphagia, unspecified (R13.10) Plan: Continue with current plan of care       GO                Royce Macadamia 06/17/2021, 11:21 AM

## 2021-06-17 NOTE — TOC Progression Note (Signed)
Transition of Care [facility]) - Progression Note    Patient Details  Name: [name] [name] MRN: [id number] Date of Birth: 08/04/1961  [facility] CM/SW Contact  Mearl Latin, LCSW Phone Number: 06/17/2021, 11:43 AM  Clinical Narrative:    CSW received response from [facility]; they are unable to accept patient.    Expected Discharge Plan: Skilled Nursing Facility Barriers to Discharge: Continued Medical Work up  Expected Discharge Plan and Services Expected Discharge Plan: Skilled Nursing Facility In-house Referral: Clinical Social Work   Post Acute Care Choice: IP Rehab, Skilled Nursing Facility Living arrangements for the past 2 months: Apartment                                       Social Determinants of Health (SDOH) Interventions    Readmission Risk Interventions No flowsheet data found.

## 2021-06-17 NOTE — Progress Notes (Signed)
PROGRESS NOTE    [name] [name]  WUJ:[id number] DOB: 01-12-1961 DOA: 05/19/2021 PCP: Pcp, No   Brief Narrative:  [name] [name] is an 60 y.o. male past medical history past medical history of alcohol abuse and drug abuse who suffered a subarachnoid hemorrhage status post coiling of the anterior communicating aneurysm, underwent VP shunting on 06/09/2019   Significant Events: 5/25 Admitted with [facility] secondary to ruptured aneurysm, ventriculostomy preformed in ED, Ancef started 5/26 Coil Embolization 5/29 worsening neurological exam consistent with vasospasm, hemodynamic augmentation initiated. 6/1 worsening hyponatremia: Na of 120.  Patient started on salt tablets 1 g twice daily and fludrocortisone 0.1 mg. 6/2 BP goal reduced 160 -180.  Salt Tablet increased to 1 g tid. Fludrocortisone increased 0.2 mg. 6/8 Ongoing agitation, intermittently following commands. CT Head with worsening hydrocephalus despite EVD, open to 10 per NSGY. 6/9 EVD remains in place, repeat head CT as below 6/10-12, no significant neuro changes, intermittently agitated.  Ongoing EVD, plans for VP shunt next week 6/13 no neuro changes, remains intermittently restless 6/14 went for VP shunt, no neuro changes, afebrile 6/21 status post PEG tube placement nutrition started 24 hours postplacement.   Awaiting skilled nursing facility placement   Assessment & Plan:   Active Problems:   Cerebral salt-wasting   Aneurysmal subarachnoid hemorrhage (HCC)   Vasospasm of cerebral artery   Diarrheal stools   Acute metabolic encephalopathy subarachnoid hemorrhage secondary to aneurysm rupture: With obstructive hydrocephalus status post EVD on 06/08/2021. Not able to tolerate his orals. PEG tube placement on 06/15/2021. Continue aspiration precaution and seizure precautions.  He is currently on Keppra for seizure prophylaxis. Physical therapy evaluated the patient, recommended skilled nursing facility.   Hyponatremia  suspect cerebral salt wasting syndrome: Resolved discontinued salt tablets.  Diarrhea: Continue loperamide.  Resolved  Physical deconditioning in the setting of subarachnoid hemorrhage: Will need to go to skilled nursing facility.   Nutrition: Continue nutrition per core track. IR to place PEG on 06/16/2021. metabolic panel tomorrow morning.  Leukocytosis: Likely stress margination has remained afebrile, continue to hold antibiotics.  DVT prophylaxis: SCD/Compression stockings  Code Status: full    Code Status Orders  (From admission, onward)           Start     Ordered   05/19/21 2022  Full code  Continuous        05/19/21 2022           Code Status History     Date Active Date Inactive Code Status Order ID Comments User Context   10/31/2016 2152 11/01/2016 1945 Full Code [id number]  Clifton Custard Inpatient   10/31/2016 1308 10/31/2016 2047 Full Code [id number]  Dionne Ano ED   06/13/2013 1417 06/13/2013 2022 Full Code [id number]  Emilia Beck, PA-C ED      Family Communication: none today  Disposition Plan:   SNF Consults called: None Admission status: Inpatient Status is: Inpatient   Remains inpatient appropriate because:Hemodynamically unstable   Dispo: The patient is from: Home              Anticipated d/c is to: Skilled nursing facility              Patient currently is not medically stable to d/c.              Difficult to place patient No  Consultants:  NSU, IR  Procedures:  CT ABDOMEN WO CONTRAST  Result Date: 06/14/2021 CLINICAL DATA:  Evaluate anatomy for  percutaneous gastrostomy tube placement. History of intracranial aneurysm and hemorrhage. EXAM: CT ABDOMEN WITHOUT CONTRAST TECHNIQUE: Multidetector CT imaging of the abdomen was performed following the standard protocol without IV contrast. COMPARISON:  None. FINDINGS: Lower chest: Few dependent densities at the right lung base. Otherwise, the lung bases are clear.  There appears to be a small hiatal hernia. Hepatobiliary: Normal appearance of the liver and gallbladder. No significant biliary dilatation. Pancreas: Unremarkable. No pancreatic ductal dilatation or surrounding inflammatory changes. Spleen: Normal in size without focal abnormality. Adrenals/Urinary Tract: Normal appearance of the adrenal glands. Normal appearance of both kidneys without stones or hydronephrosis. Stomach/Bowel: Nasogastric tube extends in the stomach and terminates near the duodenal bulb. The stomach is cephalad to the transverse colon. No significant bowel dilatation. No focal bowel inflammation. Vascular/Lymphatic: Atherosclerotic calcifications in the abdominal aorta without aneurysm. No significant lymph node enlargement in the abdomen. Other: VP shunt enters through the anterior right upper abdomen. There is a small amount of gas around the shunt tubing as it enters the abdominal cavity. There is also free air within the anterior upper abdomen likely secondary to the shunt placement. The shunt tubing coils around the right side of the abdomen and terminates in the mid upper abdomen adjacent to the mid transverse colon. No significant free fluid in the abdomen. Areas of subcutaneous edema in the anterior abdomen could be related to injection sites. Musculoskeletal: Sclerosis involving the anterior and mid L2 vertebral body is likely related to degenerative changes. No suspicious bone findings. IMPRESSION: 1. Stomach is located in the anterior upper abdomen and anatomy should be amendable for percutaneous gastrostomy tube placement. 2. Pneumoperitoneum which is likely secondary to recent placement of VP shunt. 3. Probable small hiatal hernia. Electronically Signed   By: Richarda Overlie M.D.   On: 06/14/2021 08:09   CT ANGIO HEAD W OR WO CONTRAST  Result Date: 05/23/2021 CLINICAL DATA:  Neuro deficit, acute stroke suspected. Aneurysmal subarachnoid hemorrhage with suspected symptomatic vasospasm.  EXAM: CT ANGIOGRAPHY HEAD TECHNIQUE: Multidetector CT imaging of the head was performed using the standard protocol during bolus administration of intravenous contrast. Multiplanar CT image reconstructions and MIPs were obtained to evaluate the vascular anatomy. CONTRAST:  51mL OMNIPAQUE IOHEXOL 350 MG/ML SOLN COMPARISON:  CT head May 20, 2021.  CTA May 19, 2021. FINDINGS: CT HEAD Brain: A right frontal ventriculostomy catheter with tip in similar position. Tube there is similar volume of intraventricular hemorrhage i with similar ventriculomegaly. Similar intraparenchymal hemorrhage in the left greater than right inferior frontal lobes, likely related to anterior communicating artery rupture. There is similar surrounding edema and subarachnoid hemorrhage. Additional areas of convexity subarachnoid hemorrhage have slightly decreased. No evidence of interval acute large vascular territory infarct. No progressive mass effect. . Vascular: See below. Skull: No acute fracture. Sinuses: Visualized sinuses are clear. Other: No mastoid effusions. CTA HEAD Anterior circulation: Bilateral ICAs are patent. Similar mild calcific atherosclerosis of the paraclinoid ICAs. Status post anterior communicating artery aneurysm coiling. Streak artifact largely obscures this region, including adjacent distal A1 and proximal A2 ACAs and the paraclinoid ICAs. The visualized MCAs and ACAs are patent. There is new fairly diffuse multifocal moderate irregularity and narrowing of the MCAs and ACAs. Posterior circulation: The intradural vertebral arteries are patent with mild narrowing. Patent basilar artery and bilateral posterior cerebral arteries. Venous sinuses: As permitted by contrast timing, patent. Review of the MIP images confirms the above findings. IMPRESSION: CT head: 1. Similar intraparenchymal and intraventricular hemorrhage in this patient with  suspected prior anterior communicating artery rupture. No progressive ventriculomegaly  or mass effect. Slightly improved convexity subarachnoid hemorrhage. 2. No evidence of acute interval large vascular territory infarct. MRI could provide more sensitive evaluation for acute infarct if clinically indicated. CTA 1. Multifocal moderate irregularity and narrowing of patent MCAs and ACAs bilaterally that is best appreciated on MIP imaging and compatible with vasospasm. Catheter arteriogram could further characterize if clinically indicated. 2. Status post anterior communicating artery aneurysm coiling with streak artifact obscuring this area. Electronically Signed   By: Feliberto HartsFrederick S Jones MD   On: 05/23/2021 18:13   CT Angio Head W or Wo Contrast  Result Date: 05/19/2021 CLINICAL DATA:  Patient found down.  Intracranial hemorrhage. EXAM: CT ANGIOGRAPHY HEAD AND NECK TECHNIQUE: Multidetector CT imaging of the head and neck was performed using the standard protocol during bolus administration of intravenous contrast. Multiplanar CT image reconstructions and MIPs were obtained to evaluate the vascular anatomy. Carotid stenosis measurements (when applicable) are obtained utilizing NASCET criteria, using the distal internal carotid diameter as the denominator. CONTRAST:  50mL OMNIPAQUE IOHEXOL 350 MG/ML SOLN COMPARISON:  Noncontrast head CT performed earlier today 05/19/2021. FINDINGS: CTA NECK FINDINGS Aortic arch: Standard aortic branching. The visualized aortic arch is normal in caliber. No hemodynamically significant innominate or proximal subclavian artery stenosis. Right carotid system: CCA and ICA patent within the neck without stenosis. No significant atherosclerotic disease. Left carotid system: CCA and ICA patent within the neck. Minimal soft and calcified plaque within the proximal left ICA results in less than 50% stenosis. Vertebral arteries: Codominant and patent within the neck without stenosis. Skeleton: Cervic advanced cervical spondylosis. Please refer to same day noncontrast CT of the  cervical spine for a complete description of cervical spine findings. Other neck: No neck mass or cervical lymphadenopathy. Thyroid unremarkable. Upper chest: No consolidation within the imaged lung apices. Review of the MIP images confirms the above findings CTA HEAD FINDINGS Evaluation is somewhat limited due to suboptimal arterial contrast enhancement. Anterior circulation: The intracranial internal carotid arteries are patent. Mild nonstenotic calcified plaque within the left cavernous and paraclinoid segments. The M1 middle cerebral arteries are patent. No M2 proximal branch occlusion or high-grade proximal M2 stenosis is identified. The anterior cerebral arteries are patent. The anterior cerebral arteries are patent. 3 mm aneurysm arising from the anterior communicating artery complex (series 7, image 118) (series 8, image 102). Posterior circulation: The intracranial vertebral arteries are patent. The basilar artery is patent. The posterior cerebral arteries are patent. Posterior communicating arteries are hypoplastic or absent bilaterally. Venous sinuses: Within the limitations of contrast timing, no convincing thrombus. Anatomic variants: As described Review of the MIP images confirms the above findings These results were called by telephone at the time of interpretation on 05/19/2021 at 7:58 pm to provider Lorre NickANTHONY ALLEN , who verbally acknowledged these results. IMPRESSION: CTA neck: The common carotid, internal carotid and vertebral arteries are patent within the neck without hemodynamically significant stenosis. Minimal soft and calcified plaque within the proximal left ICA. CTA head: 1. Somewhat limited examination due to suboptimal arterial enhancement. 2. 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 3. No intracranial large vessel occlusion or proximal high-grade arterial stenosis. Electronically Signed   By:  Jackey LogeKyle  Golden DO   On: 05/19/2021 19:59   CT HEAD WO CONTRAST  Addendum Date: 06/02/2021   ADDENDUM REPORT: 06/02/2021 11:46 ADDENDUM: Findings and recommendations discussed with Dr. Merrily Pewhand  via telephone at 11:42 a.m. Electronically Signed   By: Feliberto Harts MD   On: 06/02/2021 11:46   Result Date: 06/02/2021 CLINICAL DATA:  Hydrocephalus. EXAM: CT HEAD WITHOUT CONTRAST TECHNIQUE: Contiguous axial images were obtained from the base of the skull through the vertex without intravenous contrast. COMPARISON:  CT May 23, 2020. FINDINGS: Brain: Significantly increased ventriculomegaly, compatible with worsening hydrocephalus. Right frontal approach ventriculostomy catheter with the tip at the right lateral ventricle near the foramen of Monro, similar to prior. Visualized portions of the shunt catheter appear intact. Small (7 mm) focus of hyperdense hemorrhage in the left pericallosal frontal lobe (series 3, image 19 and series 6, image 38) is concerning for a new/interval acute hemorrhage given no definite hyperdense hemorrhage at this site on the prior, although a direct comparison is limited given evolving blood products. Otherwise, decreased size and density of intraparenchymal hemorrhage in the inferior left frontal lobe and corpus callosum. Hypodensity in these areas likely represents edema and developing encephalomalacia. Small volume of surrounding subarachnoid hemorrhage, decreased. No evidence of interval acute large vascular territory infarct. Decreased layering intraventricular hemorrhage. Significantly increased size of the lateral and third ventricles, compatible with worsening hydrocephalus. There is rounding of the temporal horns and outward convexity of the third ventricle. Vascular: No hyperdense vessel identified. Sequela of left anterior communicating artery aneurysm coiling. Surrounding streak artifact limits evaluation in this region. Skull: Right frontal burr hole.  No acute fracture.  Sinuses/Orbits: Visualized sinuses are clear. Other: No mastoid effusions. IMPRESSION: 1. Significantly increased ventriculomegaly, compatible with worsening hydrocephalus. 2. Small (7 mm) focus of hyperdense hemorrhage in the left pericallosal frontal lobe is concerning for a new/interval site of acute hemorrhage given no definite hyperdense hemorrhage at this site on the prior, although a direct comparison is limited given evolving blood products. Recommend attention on close interval follow-up. 3. Otherwise, improving intraparenchymal, subarachnoid, and intraventricular hemorrhage with edema and suspected developing encephalomalacia in the inferior left frontal lobe and corpus callosum. Electronically Signed: By: Feliberto Harts MD On: 06/02/2021 11:33   CT HEAD WO CONTRAST  Result Date: 05/20/2021 CLINICAL DATA:  Subarachnoid hemorrhage, altered mental status, follow-up examination EXAM: CT HEAD WITHOUT CONTRAST TECHNIQUE: Contiguous axial images were obtained from the base of the skull through the vertex without intravenous contrast. COMPARISON:  CTA and head CT 05/19/2021 FINDINGS: Brain: Interval right frontal ventriculostomy has been placed with its tip in the region of the foramina . Intraparenchymal hematoma within the a periventricular left frontal lobe anteriorly with extensive hemorrhage layering upon the corpus callosum, and extending into the ventricles bilaterally is again seen and appears stable since prior examination mild subarachnoid hemorrhage within the para falcine sulci of the vertex, sylvian fissures and within the suprasellar cistern appears stable. Interval coil embolization of an expected anterior communicating artery aneurysm. Stable borderline ventriculomegaly. No acute infarct. No significant mass effect or midline shift. Cerebellum is unremarkable. Vascular: No asymmetric hyperdense vasculature. Skull: No acute fracture. Sinuses/Orbits: Visualized orbits are unremarkable.  The paranasal sinuses are clear. Other: Mastoid air cells and middle ear cavities are clear. IMPRESSION: Stable extensive subarachnoid, intraparenchymal, and intraventricular hemorrhage with extensive hemorrhage laying along the corpus callosum. No significant associated mass effect or midline shift. Stable borderline ventriculomegaly. Right frontal ventriculostomy has been placed in the interval with its tip in expected position in the region of the foramina [place]. Interval coil embolization of expected anterior communicating artery aneurysm. Electronically Signed   By: Helyn Numbers MD   On: 05/20/2021  21:36   CT Head Wo Contrast  Result Date: 05/19/2021 CLINICAL DATA:  Cerebral hemorrhage suspected. Additional history provided: Altered mental status, patient found down. EXAM: CT HEAD WITHOUT CONTRAST TECHNIQUE: Contiguous axial images were obtained from the base of the skull through the vertex without intravenous contrast. COMPARISON:  Head CT examinations 05/10/2021 at FINDINGS: Brain: Moderate volume acute hemorrhage is present within the ventricles, most notably within the right lateral and third ventricles. Small volume dependent hemorrhage is also present within the occipital horn of the left lateral ventricle. Prominence of the lateral and third ventricles compatible with moderate hydrocephalus. Overall moderate to large volume acute subarachnoid hemorrhage tracking along the anterior and superior aspect of the corpus callosum, within the septum pellucidum, as well as within the anterior interhemispheric fissure, left greater than right MCA cisterns and suprasellar cistern. Additional trace scattered acute subarachnoid hemorrhage along the bilateral cerebral hemispheres. Additionally, there is acute parenchymal hemorrhage within the anterior left frontal lobe measuring 2.8 x 1.1 cm. Mild surrounding edema within the anterior left frontal lobe, and also likely within the left callosal body/genu. Trace  acute hemorrhage is also present along the falx. No evidence of intracranial mass. No midline shift. Vascular: No hyperdense vessel.  Atherosclerotic calcifications. Skull: Normal. Negative for fracture or focal lesion. Sinuses/Orbits: Visualized orbits show no acute finding. Trace bilateral ethmoid sinus mucosal thickening. These results were called by telephone at the time of interpretation on 05/19/2021 at 7:05 pm to provider Lorre Nick , who verbally acknowledged these results. IMPRESSION: 1. Moderate volume acute intraventricular hemorrhage, as described. Associated prominence of the lateral and third ventricles compatible with moderate hydrocephalus. 2. Overall moderate to large acute subarachnoid hemorrhage, as described and in a distribution suspicious for aneurysm rupture. CTA head/neck recommended for further evaluation. 3. 2.8 x 1.1 cm acute parenchymal hemorrhage within the anterior left frontal lobe. Mild surrounding edema within the anterior left frontal lobe, and also likely within the left callosal body/genu. 4. Additional trace acute hemorrhage along the falx. Electronically Signed   By: Jackey Loge DO   On: 05/19/2021 19:07   CT ANGIO NECK W OR WO CONTRAST  Result Date: 05/19/2021 CLINICAL DATA:  Patient found down.  Intracranial hemorrhage. EXAM: CT ANGIOGRAPHY HEAD AND NECK TECHNIQUE: Multidetector CT imaging of the head and neck was performed using the standard protocol during bolus administration of intravenous contrast. Multiplanar CT image reconstructions and MIPs were obtained to evaluate the vascular anatomy. Carotid stenosis measurements (when applicable) are obtained utilizing NASCET criteria, using the distal internal carotid diameter as the denominator. CONTRAST:  50mL OMNIPAQUE IOHEXOL 350 MG/ML SOLN COMPARISON:  Noncontrast head CT performed earlier today 05/19/2021. FINDINGS: CTA NECK FINDINGS Aortic arch: Standard aortic branching. The visualized aortic arch is normal in  caliber. No hemodynamically significant innominate or proximal subclavian artery stenosis. Right carotid system: CCA and ICA patent within the neck without stenosis. No significant atherosclerotic disease. Left carotid system: CCA and ICA patent within the neck. Minimal soft and calcified plaque within the proximal left ICA results in less than 50% stenosis. Vertebral arteries: Codominant and patent within the neck without stenosis. Skeleton: Cervic advanced cervical spondylosis. Please refer to same day noncontrast CT of the cervical spine for a complete description of cervical spine findings. Other neck: No neck mass or cervical lymphadenopathy. Thyroid unremarkable. Upper chest: No consolidation within the imaged lung apices. Review of the MIP images confirms the above findings CTA HEAD FINDINGS Evaluation is somewhat limited due to suboptimal arterial contrast enhancement.  Anterior circulation: The intracranial internal carotid arteries are patent. Mild nonstenotic calcified plaque within the left cavernous and paraclinoid segments. The M1 middle cerebral arteries are patent. No M2 proximal branch occlusion or high-grade proximal M2 stenosis is identified. The anterior cerebral arteries are patent. The anterior cerebral arteries are patent. 3 mm aneurysm arising from the anterior communicating artery complex (series 7, image 118) (series 8, image 102). Posterior circulation: The intracranial vertebral arteries are patent. The basilar artery is patent. The posterior cerebral arteries are patent. Posterior communicating arteries are hypoplastic or absent bilaterally. Venous sinuses: Within the limitations of contrast timing, no convincing thrombus. Anatomic variants: As described Review of the MIP images confirms the above findings These results were called by telephone at the time of interpretation on 05/19/2021 at 7:58 pm to provider Lorre Nick , who verbally acknowledged these results. IMPRESSION: CTA neck:  The common carotid, internal carotid and vertebral arteries are patent within the neck without hemodynamically significant stenosis. Minimal soft and calcified plaque within the proximal left ICA. CTA head: 1. Somewhat limited examination due to suboptimal arterial enhancement. 2. 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 3. No intracranial large vessel occlusion or proximal high-grade arterial stenosis. Electronically Signed   By: Jackey Loge DO   On: 05/19/2021 19:59   CT Cervical Spine Wo Contrast  Result Date: 05/19/2021 CLINICAL DATA:  Found down/unresponsive. EXAM: CT CERVICAL SPINE WITHOUT CONTRAST TECHNIQUE: Multidetector CT imaging of the cervical spine was performed without intravenous contrast. Multiplanar CT image reconstructions were also generated. COMPARISON:  MRI of the cervical spine of 06/19/2020. Today's head CT is dictated separately (by neuroradiology). FINDINGS: Alignment: Spinal visualization through the top of T3. Straightening of expected cervical lordosis. Maintenance of vertebral body height. Mild anterolisthesis of C4 on C5 is similar to the prior MRI, given differences in technique. Skull base and vertebrae: Skull base intact. Coronal reformats demonstrate a normal C1-C2 articulation. Facets are well-aligned. Right-sided C3-4 facets are partially fused. Advanced spondylosis at C4 through C7 with endplate osteophytes, loss of intervertebral disc height. Soft tissues and spinal canal: Prevertebral soft tissues are within normal limits. Disc levels: Neural foraminal narrowing bilaterally at C3-4 and C4-5 secondary to uncovertebral joint hypertrophy. Right neural foraminal and central canal stenosis at C5-6 secondary to disc osteophyte complex and uncovertebral joint hypertrophy. Bilateral neural foraminal and central canal narrowing at C6-7 secondary to above factors. Upper chest: No apical  pneumothorax. Other: None. IMPRESSION: Advanced cervical spondylosis, as detailed above. No acute superimposed process. Nonspecific straightening of expected cervical lordosis. Electronically Signed   By: Jeronimo Greaves M.D.   On: 05/19/2021 18:42   IR GASTROSTOMY TUBE MOD SED  Result Date: 06/15/2021 INDICATION: Dysphagia second to subarachnoid hemorrhage post coiling of A-comm aneurysm and VP shunt placement. Please perform percutaneous gastrostomy tube placement for enteric nutrition supplementation purposes. EXAM: PULL TROUGH GASTROSTOMY TUBE PLACEMENT COMPARISON:  None. MEDICATIONS: Ancef 2 gm IV; Antibiotics were administered within 1 hour of the procedure. Glucagon 1 mg IV CONTRAST:  15 cc of Omnipaque 300 administered into the gastric lumen. ANESTHESIA/SEDATION: Moderate (conscious) sedation was employed during this procedure. A total of Versed 1 mg and Fentanyl 50 mcg was administered intravenously. Moderate Sedation Time: 33 minutes. The patient's level of consciousness and vital signs were monitored continuously by radiology nursing throughout the procedure under my direct supervision. FLUOROSCOPY TIME:  6 minutes, 48 seconds (46 mGy) COMPLICATIONS: None immediate. PROCEDURE: Informed  written consent was obtained from the patient's family following explanation of the procedure, risks, benefits and alternatives. A time out was performed prior to the initiation of the procedure. Ultrasound scanning was performed to demarcate the edge of the left lobe of the liver. Maximal barrier sterile technique utilized including caps, mask, sterile gowns, sterile gloves, large sterile drape, hand hygiene and Betadine prep. The left upper quadrant was sterilely prepped and draped. An oral gastric catheter was inserted into the stomach under fluoroscopy. The existing nasogastric feeding tube was removed. The left costal margin and air opacified transverse colon were identified and avoided. Air was injected into the  stomach for insufflation and visualization under fluoroscopy. Under sterile conditions a 17 gauge trocar needle was utilized to access the stomach percutaneously beneath the left subcostal margin after the overlying soft tissues were anesthetized with 1% Lidocaine with epinephrine. Special attention was made to avoid the tip of the ventriculoperitoneal catheter tubing. Needle position was confirmed within the stomach with aspiration of air and injection of small amount of contrast. A single T tack was deployed for gastropexy. Over an Amplatz guide wire, a 9-French sheath was inserted into the stomach. A snare device was utilized to capture the oral gastric catheter. The snare device was pulled retrograde from the stomach up the esophagus and out the oropharynx. The 20-French pull-through gastrostomy was connected to the snare device and pulled antegrade through the oropharynx down the esophagus into the stomach and then through the percutaneous tract external to the patient. The gastrostomy was assembled externally. Contrast injection confirms appropriate positioning within the stomach. Several spot radiographic images were obtained in various obliquities for documentation. Dressings were applied. The patient tolerated procedure well without immediate post procedural complication. FINDINGS: After successful fluoroscopic guided placement, the gastrostomy tube is appropriately positioned with internal disc positioned against the inner ventral wall of the gastric lumen. IMPRESSION: Successful fluoroscopic insertion of a 20-French pull-through gastrostomy tube. The gastrostomy may be used immediately for medication administration and in 24 hrs for the initiation of feeds. Electronically Signed   By: Simonne Come M.D.   On: 06/15/2021 16:38   IR Transcath/Emboliz  Result Date: 05/20/2021 PROCEDURE: DIAGNOSTIC CEREBRAL ANGIOGRAM COIL EMBOLIZATION OF ANTERIOR COMMUNICATING ARTERY ANEURYSM HISTORY: The patient is a  60 year old man presenting to the hospital after acute mental status change. His initial workup included CT scan demonstrating significant intraventricular hemorrhage and left-sided subarachnoid hemorrhage. CT angiogram was done demonstrating likelihood of an anterior communicating artery aneurysm. Ventriculostomy was placed and the patient's neurologic status did improve overnight. He therefore presents now for diagnostic cerebral angiogram with possible aneurysm embolization. ACCESS: The technical aspects of the procedure as well as its potential risks and benefits were reviewed with the patient's mother. These risks included but were not limited bleeding, infection, allergic reaction, damage to organs or vital structures, stroke, non-diagnostic procedure, and the catastrophic outcomes of heart attack, coma, and death. With an understanding of these risks, informed consent was obtained and witnessed. The patient was placed in the supine position on the angiography table and the skin of right groin prepped in the usual sterile fashion. Case was done under general endotracheal anesthesia monitored by the anesthesia service. MEDICATIONS: HEPARIN: 0 Units total. CONTRAST:  cc, Omnipaque 300 FLUOROSCOPY TIME:  FLUOROSCOPY TIME: See IR records TECHNIQUE: CATHETERS AND WIRES 5-French JB-1 catheter 180 cm 0.035" glidewire 6-French NeuronMax guide sheath 6-French Berenstein Select JB-1 catheter 0.058" CatV guidecatheter 150 cm XT 27 microcatheter Synchro 2 select soft microwire Excelsior  XT-17 microcatheter COILS USED Target 360 XL 3 mm x 6 cm VESSELS CATHETERIZED Right internal carotid Left internal carotid Left vertebral Right common femoral Right anterior cerebral artery VESSELS STUDIED Right internal carotid, head Left internal carotid, head Left vertebral Right internal carotid artery, head (during embolization) Right internal carotid artery, head (immediate post-embolization) Right internal carotid artery, head  (final control) Right common femoral PROCEDURAL NARRATIVE A 5-Fr JB-1 glide catheter was advanced over a 0.035 glidewire into the aortic arch. The above vessels were then sequentially catheterized and cervical / cerebral angiograms taken. After review of images, the catheter was removed without incident. The 5-Fr sheath was then exchanged over the glidewire for an 8-Fr sheath. Under real-time fluoroscopy, the guide sheath was advanced over the select catheter and glidewire into the descending aorta. The select catheter was then advanced into the cervical right internal carotid artery. The guide sheath was then advanced into the proximal cervical right internal carotid artery. The Select catheter was removed and the 058 guide catheter was coaxially introduced over the microcatheter and microwire. The microcatheter was then advanced under roadmap guidance into the cavernous internal carotid artery. The guide catheter was then tracked into the cavernous segment of the internal carotid artery. The microwire was then used to select the right middle cerebral artery and the 27 microcatheter was advanced into the M1-M2 junction. This allowed tracking of the guide catheter into the supraclinoid internal carotid artery. Fluoro phase run was taken which demonstrated significant contrast stasis within the right carotid distribution suggesting significant catheter induced spasm. I therefore withdrew the guide catheter into the cavernous segment of the internal carotid artery. Another fluoro phase run demonstrated improved flow through the right carotid circulation. At this point the 27 catheter was removed and the 17 catheter was introduced over the same microwire. Initially, the microwire was advanced into the A2 segment which allowed advancement of the microcatheter into the right A1. Microwire was then withdrawn and the aneurysm lumen was catheterized. The above coil was then deployed. Run taken demonstrated occlusion of the  dome of the aneurysm, with a small neck residual. There was significant for contrast stasis within the right ICA territory again suggesting significant likely catheter-related spasm. I therefore elected to complete the procedure at this point, with good dome protection of the aneurysm and minimal neck residual. The microcatheter was removed. Immediate post embolization angiogram was taken. The guide catheter and guide sheath were then withdrawn down into the proximal cervical internal carotid artery. Final control angiogram was taken. The guide catheter and guide sheath were then synchronously removed without incident. FINDINGS: Right internal carotid, head: Injection reveals the presence of a widely patent ICA, M1, and A1 segments and their branches. There is an aneurysm projecting towards the left arising from the right A1 A2 junction. This aneurysm has a relatively narrow neck, with a dome measuring approximately 3.0 x 2.6 mm. There is significant narrowing of the supraclinoid internal carotid artery and M1 and A1 segments likely reflecting vasospasm. The parenchymal and venous phases are normal. The venous sinuses are widely patent. Left internal carotid, head: Injection reveals the presence of a widely patent ICA, A1, and M1 segments and their branches. There is significant narrowing consistent with vasospasm involving the supraclinoid ICA as well as the first segment its of the anterior and middle cerebral arteries. No left-sided carotid aneurysms are seen. The parenchymal and venous phases are normal. The venous sinuses are widely patent. Left vertebral: Injection reveals the presence of a widely  patent vertebral artery. This leads to a widely patent basilar artery that terminates in bilateral P1. The basilar apex is normal. No aneurysms, AVMs, or high-flow fistulas are seen. There is no significant vasospasm of the basilar artery or posterior circulation. The parenchymal and venous phases are normal. The  venous sinuses are widely patent. Right internal carotid artery, head (during embolization): Injection reveals the presence of a widely patent ICA that leads to a patent ACA and MCA, although there is significant vasospasm involving the right A1 and M1. Coil mass within the aneurysm is stable, without coil prolapse or filling defect to suggest thrombus. Aneurysm appears to be occluded. Right internal carotid artery, head (immediate post-embolization): Injection reveals the presence of a widely patent ICA that leads to a patent ACA and MCA. Coil mass within the aneurysm is stable, without coil prolapse or filling defect to suggest thrombus. No aneurysm filling is seen. There remains significant vasospasm involving the supraclinoid internal carotid artery as well as the A1 and M1 segments. Right internal carotid artery, head (final control): Injection reveals the presence of a widely patent ICA that leads to a patent ACA and MCA. No thrombus is visualized. Coil mass is seen within the aneurysm and is in stable position. There is continued spasm involving the supraclinoid internal carotid artery, A1, and M1 segments. Capillary phase however does not demonstrate any perfusion deficits. Venous sinuses are patent. Right femoral: Normal vessel. No significant atherosclerotic disease. Arterial sheath in adequate position. DISPOSITION: Upon completion of the study, the femoral sheath was removed and hemostasis obtained using a 7-Fr ExoSeal closure device. Good proximal and distal lower extremity pulses were documented upon achievement of hemostasis. The procedure was well tolerated and no early complications were observed. The patient was transferred to the postanesthesia care unit in stable hemodynamic condition. IMPRESSION: 1. 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 2. There is moderate vasospasm involving the bilateral supraclinoid internal carotid arteries, and bilateral A1 and M1  segments which does not appear to be flow limiting. The preliminary results of this procedure were shared with the patient's family. 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 His initial workup included CT scan demonstrating significant intraventricular hemorrhage and left-sided subarachnoid hemorrhage. CT angiogram was done demonstrating likelihood of an anterior communicating artery aneurysm. Ventriculostomy was placed and the patient's neurologic status did improve overnight. He therefore presents now for diagnostic cerebral angiogram with possible aneurysm embolization. ACCESS: The technical aspects of the procedure as well as its potential risks and benefits were reviewed with the patient's mother. These risks included but were not limited bleeding, infection, allergic reaction, damage to organs or vital structures, stroke, non-diagnostic procedure, and the catastrophic outcomes of heart attack, coma, and death. With an understanding of these risks, informed consent was obtained and witnessed. The patient was placed in the supine position on the angiography table and the skin of right groin prepped in the usual sterile fashion. Case was done under general endotracheal anesthesia monitored by the anesthesia service. MEDICATIONS: HEPARIN: 0 Units total. CONTRAST:  cc, Omnipaque 300 FLUOROSCOPY TIME:  FLUOROSCOPY TIME: See IR records TECHNIQUE: CATHETERS AND WIRES 5-French JB-1 catheter 180 cm 0.035" glidewire 6-French NeuronMax guide sheath 6-French Berenstein Select JB-1 catheter  0.058" CatV guidecatheter 150 cm XT 27 microcatheter Synchro 2 select soft microwire Excelsior XT-17 microcatheter COILS USED Target 360 XL 3 mm x 6  cm VESSELS CATHETERIZED Right internal carotid Left internal carotid Left vertebral Right common femoral Right anterior cerebral artery VESSELS STUDIED Right internal carotid, head Left internal carotid, head Left vertebral Right internal carotid artery, head (during embolization) Right internal carotid artery, head (immediate post-embolization) Right internal carotid artery, head (final control) Right common femoral PROCEDURAL NARRATIVE A 5-Fr JB-1 glide catheter was advanced over a 0.035 glidewire into the aortic arch. The above vessels were then sequentially catheterized and cervical / cerebral angiograms taken. After review of images, the catheter was removed without incident. The 5-Fr sheath was then exchanged over the glidewire for an 8-Fr sheath. Under real-time fluoroscopy, the guide sheath was advanced over the select catheter and glidewire into the descending aorta. The select catheter was then advanced into the cervical right internal carotid artery. The guide sheath was then advanced into the proximal cervical right internal carotid artery. The Select catheter was removed and the 058 guide catheter was coaxially introduced over the microcatheter and microwire. The microcatheter was then advanced under roadmap guidance into the cavernous internal carotid artery. The guide catheter was then tracked into the cavernous segment of the internal carotid artery. The microwire was then used to select the right middle cerebral artery and the 27 microcatheter was advanced into the M1-M2 junction. This allowed tracking of the guide catheter into the supraclinoid internal carotid artery. Fluoro phase run was taken which demonstrated significant contrast stasis within the right carotid distribution suggesting significant catheter induced spasm. I therefore withdrew the guide catheter into the cavernous segment of the internal carotid artery. Another fluoro phase run demonstrated improved flow through the right carotid  circulation. At this point the 27 catheter was removed and the 17 catheter was introduced over the same microwire. Initially, the microwire was advanced into the A2 segment which allowed advancement of the microcatheter into the right A1. Microwire was then withdrawn and the aneurysm lumen was catheterized. The above coil was then deployed. Run taken demonstrated occlusion of the dome of the aneurysm, with a small neck residual. There was significant for contrast stasis within the right ICA territory again suggesting significant likely catheter-related spasm. I therefore elected to complete the procedure at this point, with good dome protection of the aneurysm and minimal neck residual. The microcatheter was removed. Immediate post embolization angiogram was taken. The guide catheter and guide sheath were then withdrawn down into the proximal cervical internal carotid artery. Final control angiogram was taken. The guide catheter and guide sheath were then synchronously removed without incident. FINDINGS: Right internal carotid, head: Injection reveals the presence of a widely patent ICA, M1, and A1 segments and their branches. There is an aneurysm projecting towards the left arising from the right A1 A2 junction. This aneurysm has a relatively narrow neck, with a dome measuring approximately 3.0 x 2.6 mm. There is significant narrowing of the supraclinoid internal carotid artery and M1 and A1 segments likely reflecting vasospasm. The parenchymal and venous phases are normal. The venous sinuses are widely patent. Left internal carotid, head: Injection reveals the presence of a widely patent ICA, A1, and M1 segments and their branches. There is significant narrowing consistent with vasospasm involving the supraclinoid ICA as well as the first segment its of the anterior and middle cerebral arteries. No left-sided carotid aneurysms are seen. The parenchymal and venous phases are normal. The  venous sinuses are widely  patent. Left vertebral: Injection reveals the presence of a widely patent vertebral artery. This leads to a widely patent basilar artery that terminates in bilateral P1. The basilar apex is normal. No aneurysms, AVMs, or high-flow fistulas are seen. There is no significant vasospasm of the basilar artery or posterior circulation. The parenchymal and venous phases are normal. The venous sinuses are widely patent. Right internal carotid artery, head (during embolization): Injection reveals the presence of a widely patent ICA that leads to a patent ACA and MCA, although there is significant vasospasm involving the right A1 and M1. Coil mass within the aneurysm is stable, without coil prolapse or filling defect to suggest thrombus. Aneurysm appears to be occluded. Right internal carotid artery, head (immediate post-embolization): Injection reveals the presence of a widely patent ICA that leads to a patent ACA and MCA. Coil mass within the aneurysm is stable, without coil prolapse or filling defect to suggest thrombus. No aneurysm filling is seen. There remains significant vasospasm involving the supraclinoid internal carotid artery as well as the A1 and M1 segments. Right internal carotid artery, head (final control): Injection reveals the presence of a widely patent ICA that leads to a patent ACA and MCA. No thrombus is visualized. Coil mass is seen within the aneurysm and is in stable position. There is continued spasm involving the supraclinoid internal carotid artery, A1, and M1 segments. Capillary phase however does not demonstrate any perfusion deficits. Venous sinuses are patent. Right femoral: Normal vessel. No significant atherosclerotic disease. Arterial sheath in adequate position. DISPOSITION: Upon completion of the study, the femoral sheath was removed and hemostasis obtained using a 7-Fr ExoSeal closure device. Good proximal and distal lower extremity pulses were documented upon achievement of hemostasis.  The procedure was well tolerated and no early complications were observed. The patient was transferred to the postanesthesia care unit in stable hemodynamic condition. IMPRESSION: 1. 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 2. There is moderate vasospasm involving the bilateral supraclinoid internal carotid arteries, and bilateral A1 and M1 segments which does not appear to be flow limiting. The preliminary results of this procedure were shared with the patient's family. Electronically Signed   By: Lisbeth Renshaw   On: 05/20/2021 16:23   DG CHEST PORT 1 VIEW  Result Date: 05/23/2021 CLINICAL DATA:  Subarachnoid hemorrhage EXAM: PORTABLE CHEST 1 VIEW COMPARISON:  May 19, 2021 FINDINGS: The cardiomediastinal silhouette is unchanged in contour.Enteric tube tip projects over the proximal duodenum. No pleural effusion. No pneumothorax. LEFT basilar linear opacity. Visualized abdomen is unremarkable. Multilevel degenerative changes of the thoracic spine. IMPRESSION: LEFT basilar linear opacity with differential considerations including infection or atelectasis. Electronically Signed   By: Meda Klinefelter MD   On: 05/23/2021 11:38   DG Chest Port 1 View  Result Date: 05/19/2021 CLINICAL DATA:  60 year old male with shortness of breath. EXAM: PORTABLE CHEST 1 VIEW COMPARISON:  None. FINDINGS: No focal consolidation, pleural effusion, or pneumothorax. Mild cardiomegaly. No acute osseous pathology. IMPRESSION: No active disease. Electronically Signed   By: Elgie Collard M.D.   On: 05/19/2021 20:29   DG Abd Portable 1V  Result Date: 05/21/2021 CLINICAL DATA:  Feeding tube placement. EXAM: PORTABLE ABDOMEN - 1 VIEW COMPARISON:  None. FINDINGS: Bowel gas pattern is normal. Small bore feeding tube terminates in the distal stomach. Lung bases are clear. IMPRESSION: Small bore feeding tube terminates in the distal stomach. Electronically Signed   By: Virl Son.D.  On: 05/21/2021 12:22   ECHOCARDIOGRAM COMPLETE  Result Date: 05/26/2021    ECHOCARDIOGRAM REPORT   Patient Name:   [place] Date of Exam: 05/26/2021 Medical Rec #:  [id number]        Height:       69.0 in Accession #:    [id number]       Weight:       136.5 lb Date of Birth:  [birth year]        BSA:          1.756 m Patient Age:    60 years         BP:           184/87 mmHg Patient Gender: M                HR:           91 bpm. Exam Location:  Inpatient Procedure: 2D Echo, 3D Echo, Color Doppler and Cardiac Doppler Indications:    I42.9 Cardiomyopathy (unspecified)  History:        Patient has prior history of Echocardiogram examinations, most                 recent 05/23/2021.  Sonographer:    Irving Burton Senior RDCS Referring Phys: [id number] RAVI AGARWALA IMPRESSIONS  1. Left ventricular ejection fraction by 3D volume is 63 %. The left ventricle has normal function. The left ventricle has no regional wall motion abnormalities. Left ventricular diastolic parameters are consistent with Grade I diastolic dysfunction (impaired relaxation).  2. Right ventricular systolic function is normal. The right ventricular size is normal. Tricuspid regurgitation signal is inadequate for assessing PA pressure.  3. The mitral valve is normal in structure. Trivial mitral valve regurgitation. No evidence of mitral stenosis.  4. The aortic valve is tricuspid. Aortic valve regurgitation is not visualized. No aortic stenosis is present.  5. The inferior vena cava is normal in size with greater than 50% respiratory variability, suggesting right atrial pressure of 3 mmHg. FINDINGS  Left Ventricle: Left ventricular ejection fraction by 3D volume is 63 %. The left ventricle has normal function. The left ventricle has no regional wall motion abnormalities. The left ventricular internal cavity size was normal in size. There is no left  ventricular hypertrophy. Left ventricular diastolic parameters are consistent with Grade I diastolic  dysfunction (impaired relaxation). Right Ventricle: The right ventricular size is normal. No increase in right ventricular wall thickness. Right ventricular systolic function is normal. Tricuspid regurgitation signal is inadequate for assessing PA pressure. Left Atrium: Left atrial size was normal in size. Right Atrium: Right atrial size was normal in size. Pericardium: There is no evidence of pericardial effusion. Mitral Valve: The mitral valve is normal in structure. Trivial mitral valve regurgitation. No evidence of mitral valve stenosis. Tricuspid Valve: The tricuspid valve is normal in structure. Tricuspid valve regurgitation is not demonstrated. No evidence of tricuspid stenosis. Aortic Valve: The aortic valve is tricuspid. Aortic valve regurgitation is not visualized. No aortic stenosis is present. Pulmonic Valve: The pulmonic valve was normal in structure. Pulmonic valve regurgitation is trivial. No evidence of pulmonic stenosis. Aorta: The aortic root is normal in size and structure. Venous: The inferior vena cava is normal in size with greater than 50% respiratory variability, suggesting right atrial pressure of 3 mmHg. IAS/Shunts: No atrial level shunt detected by color flow Doppler.  LEFT VENTRICLE PLAX 2D LVIDd:         4.30 cm  Diastology LVIDs:         2.90 cm         LV e' medial:    7.62 cm/s LV PW:         0.90 cm         LV E/e' medial:  9.3 LV IVS:        0.60 cm         LV e' lateral:   10.00 cm/s LVOT diam:     2.00 cm         LV E/e' lateral: 7.1 LV SV:         70 LV SV Index:   40 LVOT Area:     3.14 cm        3D Volume EF                                LV 3D EF:    Left                                             ventricular                                             ejection                                             fraction by                                             3D volume                                             is 63 %.                                 3D Volume EF:                                 3D EF:        63 %                                LV EDV:       124 ml                                LV ESV:       46 ml                                LV SV:  77 ml RIGHT VENTRICLE RV S prime:     7.72 cm/s TAPSE (M-mode): 1.8 cm LEFT ATRIUM             Index       RIGHT ATRIUM           Index LA diam:        3.90 cm 2.22 cm/m  RA Area:     11.20 cm LA Vol (A2C):   38.0 ml 21.64 ml/m RA Volume:   22.40 ml  12.75 ml/m LA Vol (A4C):   40.2 ml 22.89 ml/m LA Biplane Vol: 39.7 ml 22.61 ml/m  AORTIC VALVE LVOT Vmax:   140.00 cm/s LVOT Vmean:  95.000 cm/s LVOT VTI:    0.223 m  AORTA Ao Root diam: 3.30 cm Ao Asc diam:  3.10 cm MITRAL VALVE MV Area (PHT): 3.21 cm    SHUNTS MV Decel Time: 236 msec    Systemic VTI:  0.22 m MV E velocity: 71.00 cm/s  Systemic Diam: 2.00 cm MV A velocity: 94.00 cm/s MV E/A ratio:  0.76 Weston Brass MD Electronically signed by Weston Brass MD Signature Date/Time: 05/26/2021/9:20:19 PM    Final    VAS [place] TRANSCRANIAL DOPPLER  Result Date: 06/04/2021  Transcranial Doppler Patient Name:  HENRRY FEIL  Date of Exam:   06/02/2021 Medical Rec #: [id number]         Accession #:    [id number] Date of Birth: July 02, 1961         Patient Gender: M Patient Age:   60Y Exam Location:  [facility] Procedure:      VAS [place] TRANSCRANIAL DOPPLER Referring Phys: [id number] Donalee Citrin --------------------------------------------------------------------------------  Indications: Subarachnoid hemorrhage. Limitations: Patient unable to remain stationary for duration of examination. Comparison Study: Most recent prior study 05-31-2021 Performing Technologist: Jean Rosenthal RDMS,RVT  Examination Guidelines: A complete evaluation includes B-mode imaging, spectral Doppler, color Doppler, and power Doppler as needed of all accessible portions of each vessel. Bilateral testing is considered an integral part of a complete examination. Limited examinations for reoccurring  indications may be performed as noted.  +----------+-------------+----------+-----------+-------+ RIGHT TCD Right VM (cm)Depth (cm)PulsatilityComment +----------+-------------+----------+-----------+-------+ MCA          119.00       5.50      0.72            +----------+-------------+----------+-----------+-------+ ACA          -31.00                 0.68            +----------+-------------+----------+-----------+-------+ Term ICA     111.00       5.60      0.67            +----------+-------------+----------+-----------+-------+ PCA           38.00                 0.67            +----------+-------------+----------+-----------+-------+ Opthalmic     29.00                 1.24            +----------+-------------+----------+-----------+-------+ ICA siphon    29.00                 1.17            +----------+-------------+----------+-----------+-------+ Vertebral    -20.00  0.79            +----------+-------------+----------+-----------+-------+ Distal ICA    23.00                 0.82            +----------+-------------+----------+-----------+-------+  +----------+------------+----------+-----------+-------+ LEFT TCD  Left VM (cm)Depth (cm)PulsatilityComment +----------+------------+----------+-----------+-------+ MCA          130.00      4.10      0.86            +----------+------------+----------+-----------+-------+ ACA          -26.00                0.32            +----------+------------+----------+-----------+-------+ Term ICA     36.00       5.70      0.79            +----------+------------+----------+-----------+-------+ PCA          25.00                 1.16            +----------+------------+----------+-----------+-------+ Opthalmic    27.00                 1.01            +----------+------------+----------+-----------+-------+ ICA siphon   34.00                 1.29             +----------+------------+----------+-----------+-------+ Vertebral    -22.00                0.61            +----------+------------+----------+-----------+-------+ Distal ICA   20.00                 0.86            +----------+------------+----------+-----------+-------+  +------------+-------+------------------+             VM cm/s     Comment       +------------+-------+------------------+ Prox Basilar       Unable to insonate +------------+-------+------------------+ +----------------------+----+ Right Lindegaard Ratio5.17 +----------------------+----+ +---------------------+----+ Left Lindegaard Ratio6.50 +---------------------+----+  Summary:  Elevated mean flow velocities in bilateral middle cerebral and terminal right internal carotid arteries suggestive of mild vasospasm. *See table(s) above for TCD measurements and observations.  Diagnosing physician: Delia Heady MD Electronically signed by Delia Heady MD on 06/04/2021 at 12:01:08 PM.    Final    VAS [place] TRANSCRANIAL DOPPLER  Result Date: 05/31/2021  Transcranial Doppler Patient Name:  OSAMU OLGUIN  Date of Exam:   05/31/2021 Medical Rec #: [id number]         Accession #:    [id number] Date of Birth: 1961-07-18         Patient Gender: M Patient Age:   60Y Exam Location:  [facility] Procedure:      VAS [place] TRANSCRANIAL DOPPLER Referring Phys: [id number] Donalee Citrin --------------------------------------------------------------------------------  Indications: Subarachnoid hemorrhage. Comparison Study: 05/28/21- TCD Performing Technologist: Gertie Fey MHA, RDMS, RVT, RDCS  Examination Guidelines: A complete evaluation includes B-mode imaging, spectral Doppler, color Doppler, and power Doppler as needed of all accessible portions of each vessel. Bilateral testing is considered an integral part of a complete examination. Limited examinations for reoccurring indications may be performed as noted.   +----------+-------------+----------+-----------+------------------+ RIGHT TCD Right VM (cm)Depth (cm)Pulsatility  Comment       +----------+-------------+----------+-----------+------------------+ MCA           92.00       4.70      0.96                       +----------+-------------+----------+-----------+------------------+ ACA          -26.00                 0.75                       +----------+-------------+----------+-----------+------------------+ Term ICA      21.00                 1.26                       +----------+-------------+----------+-----------+------------------+ PCA           18.00                 1.21                       +----------+-------------+----------+-----------+------------------+ Opthalmic     20.00                 1.24                       +----------+-------------+----------+-----------+------------------+ ICA siphon                                  Unable to insonate +----------+-------------+----------+-----------+------------------+ Vertebral                                   Unable to insonate +----------+-------------+----------+-----------+------------------+ Distal ICA    36.00                                            +----------+-------------+----------+-----------+------------------+  +----------+------------+----------+-----------+------------------+ LEFT TCD  Left VM (cm)Depth (cm)Pulsatility     Comment       +----------+------------+----------+-----------+------------------+ MCA          93.00                 0.99                       +----------+------------+----------+-----------+------------------+ ACA                                        Unable to insonate +----------+------------+----------+-----------+------------------+ Term ICA     46.00                 0.84                       +----------+------------+----------+-----------+------------------+ PCA                                         Unable to insonate +----------+------------+----------+-----------+------------------+ Opthalmic    24.00  1.24                       +----------+------------+----------+-----------+------------------+ ICA siphon                                 Unable to insonate +----------+------------+----------+-----------+------------------+ Vertebral                                  Unable to insonate +----------+------------+----------+-----------+------------------+ Distal ICA   28.00                                            +----------+------------+----------+-----------+------------------+  +------------+-------+------------------+             VM cm/s     Comment       +------------+-------+------------------+ Prox Basilar       Unable to insonate +------------+-------+------------------+ +----------------------+----+ Right Lindegaard Ratio2.56 +----------------------+----+ +---------------------+----+ Left Lindegaard Ratio3.32 +---------------------+----+  Summary:  Mildly elevared bilateral midle cerebral artery mean flow velocities of unclear significance.Not all vessels could be studied due to technical difficulties. *See table(s) above for TCD measurements and observations.  Diagnosing physician: Delia Heady MD Electronically signed by Delia Heady MD on 05/31/2021 at 4:39:43 PM.    Final    VAS [place] TRANSCRANIAL DOPPLER  Result Date: 05/28/2021  Transcranial Doppler Patient Name:  TALIN FEISTER  Date of Exam:   05/28/2021 Medical Rec #: [id number]         Accession #:    [id number] Date of Birth: 10/04/1961         Patient Gender: M Patient Age:   60Y Exam Location:  [facility] Procedure:      VAS [place] TRANSCRANIAL DOPPLER Referring Phys: [id number] Donalee Citrin --------------------------------------------------------------------------------  Indications: Subarachnoid hemorrhage. 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 Limitations: Patient movement throughout the exam Limitations for diagnostic windows: Unable to insonate right transtemporal window. Unable to insonate left transtemporal window. Unable to insonate occipital window. Performing Technologist: Marilynne Halsted RDMS, RVT  Examination Guidelines: A complete evaluation includes B-mode imaging, spectral Doppler, color Doppler, and power Doppler as needed of all accessible portions of each vessel. Bilateral testing is considered an integral part of a complete examination. Limited examinations for reoccurring indications may be performed as noted.  +----------+-------------+----------+-----------+-------------+ RIGHT TCD Right VM (cm)Depth (cm)Pulsatility   Comment    +----------+-------------+----------+-----------+-------------+ MCA          155.00       5.60      0.71                  +----------+-------------+----------+-----------+-------------+ ACA                                         not insonated +----------+-------------+----------+-----------+-------------+ Term ICA                                    not insonated +----------+-------------+----------+-----------+-------------+ PCA           30.00  0.70                  +----------+-------------+----------+-----------+-------------+ Opthalmic                                   not insonated +----------+-------------+----------+-----------+-------------+ ICA siphon                                  not insonated +----------+-------------+----------+-----------+-------------+ Vertebral                                   not insonated +----------+-------------+----------+-----------+-------------+ Distal ICA                                  not insonated +----------+-------------+----------+-----------+-------------+  +----------+------------+----------+-----------+-------------+ LEFT TCD  Left VM (cm)Depth (cm)Pulsatility   Comment     +----------+------------+----------+-----------+-------------+ MCA          166.00      4.20      0.81                  +----------+------------+----------+-----------+-------------+ ACA                                        not insonated +----------+------------+----------+-----------+-------------+ Term ICA                                   not insonated +----------+------------+----------+-----------+-------------+ PCA          54.00                 0.83                  +----------+------------+----------+-----------+-------------+ Opthalmic                                  not insonated +----------+------------+----------+-----------+-------------+ ICA siphon                                 not insonated +----------+------------+----------+-----------+-------------+ Vertebral                                  not insonated +----------+------------+----------+-----------+-------------+ Distal ICA                                 not insonated +----------+------------+----------+-----------+-------------+  +------------+-------+-------------+             VM cm/s   Comment    +------------+-------+-------------+ Prox Basilar       not insonated +------------+-------+-------------+ Dist Basilar       not insonated +------------+-------+-------------+ Summary:  patient movement limited today's study. only b/l MCA and PCA were able to recorded. with this limited data, still has elevated b/l MCA MVFs, not significantly changed from prior, still suggesting moderate vasospasm b/l MCAs. clinical correlation recommended. *See table(s) above for TCD measurements and observations.  Diagnosing  physician: Marvel Plan MD Electronically signed by Marvel Plan MD on 05/28/2021 at 2:46:59 PM.    Final    VAS [place] TRANSCRANIAL DOPPLER  Result Date: 05/28/2021  Transcranial Doppler Patient Name:  BODEY FRIZELL  Date of Exam:   05/26/2021 Medical Rec #: [id number]          Accession #:    [id number] Date of Birth: [birth year]         Patient Gender: M Patient Age:   60Y Exam Location:  [facility] Procedure:      VAS [place] TRANSCRANIAL DOPPLER Referring Phys: [id number] Donalee Citrin --------------------------------------------------------------------------------  Indications: Subarachnoid hemorrhage. Limitations: Patient position/ lines/ drains Comparison Study: 05/24/21 Performing Technologist: Jeb Levering RDMS, RVT  Examination Guidelines: A complete evaluation includes B-mode imaging, spectral Doppler, color Doppler, and power Doppler as needed of all accessible portions of each vessel. Bilateral testing is considered an integral part of a complete examination. Limited examinations for reoccurring indications may be performed as noted.  +----------+-------------+----------+-----------+--------------+ RIGHT TCD Right VM (cm)Depth (cm)Pulsatility   Comment     +----------+-------------+----------+-----------+--------------+ MCA          128.00                 0.84                   +----------+-------------+----------+-----------+--------------+ ACA          -144.00                0.67                   +----------+-------------+----------+-----------+--------------+ Term ICA      67.00                 0.98                   +----------+-------------+----------+-----------+--------------+ PCA           29.00                 0.96                   +----------+-------------+----------+-----------+--------------+ Opthalmic     22.00                 0.95                   +----------+-------------+----------+-----------+--------------+ ICA siphon                                  not visualized +----------+-------------+----------+-----------+--------------+ Vertebral    -52.00                                        +----------+-------------+----------+-----------+--------------+ Distal ICA    38.00                                         +----------+-------------+----------+-----------+--------------+  +----------+------------+----------+-----------+--------------+ LEFT TCD  Left VM (cm)Depth (cm)Pulsatility   Comment     +----------+------------+----------+-----------+--------------+ MCA          156.00                0.76                   +----------+------------+----------+-----------+--------------+  ACA          -55.00                0.76                   +----------+------------+----------+-----------+--------------+ Term ICA     38.00                 0.95                   +----------+------------+----------+-----------+--------------+ PCA          30.00                 0.96                   +----------+------------+----------+-----------+--------------+ Opthalmic    20.00                 1.73                   +----------+------------+----------+-----------+--------------+ ICA siphon                                 not visualized +----------+------------+----------+-----------+--------------+ Vertebral    -53.00                                       +----------+------------+----------+-----------+--------------+ Distal ICA   33.00                                        +----------+------------+----------+-----------+--------------+  +------------+-------+-------+             VM cm/sComment +------------+-------+-------+ Prox Basilar-58.00         +------------+-------+-------+ Dist Basilar-47.00         +------------+-------+-------+ +----------------------+----+ Right Lindegaard Ratio3.37 +----------------------+----+ +---------------------+----+ Left Lindegaard Ratio4.73 +---------------------+----+  Summary:  elevated b/l MCA MFVs as well as b/l Lindegaard ratios no significant or slightly improved from prior, still suggesting mild to moderate vasospasm more on the left. right ACA MFV also elevated this time which was not present on prior study. However, left  ACA  elevated MFV seems resolved. continue monitoring warranted. clinical correlation recommended. *See table(s) above for TCD measurements and observations.  Diagnosing physician: Marvel Plan MD Electronically signed by Marvel Plan MD on 05/28/2021 at 2:45:06 PM.    Final    VAS [place] TRANSCRANIAL DOPPLER  Result Date: 05/28/2021  Transcranial Doppler Patient Name:  NASHID PELLUM  Date of Exam:   05/24/2021 Medical Rec #: [id number]         Accession #:    [id number] Date of Birth: 01-24-1961         Patient Gender: M Patient Age:   60Y Exam Location:  [facility] Procedure:      VAS [place] TRANSCRANIAL DOPPLER Referring Phys: [id number] Donalee Citrin --------------------------------------------------------------------------------  Indications: Subarachnoid hemorrhage. Limitations: Patient unable to cooperate; combative. Limitations for diagnostic windows: Unable to insonate occipital window. Comparison Study: 05/21/21- TCD Performing Technologist: Gertie Fey MHA, RDMS, RVT, RDCS  Examination Guidelines: A complete evaluation includes B-mode imaging, spectral Doppler, color Doppler, and power Doppler as needed of all accessible portions of each vessel. Bilateral testing is considered an integral part of a complete examination. Limited examinations for reoccurring indications may  be performed as noted.  +----------+-------------+----------+-----------+------------------+ RIGHT TCD Right VM (cm)Depth (cm)Pulsatility     Comment       +----------+-------------+----------+-----------+------------------+ MCA          135.00       5.40      0.94                       +----------+-------------+----------+-----------+------------------+ ACA          -38.00                 1.13                       +----------+-------------+----------+-----------+------------------+ Term ICA      21.00                 1.20                       +----------+-------------+----------+-----------+------------------+ PCA            46.00                 0.90                       +----------+-------------+----------+-----------+------------------+ Opthalmic     17.00                 1.27                       +----------+-------------+----------+-----------+------------------+ ICA siphon    21.00                  1.2                       +----------+-------------+----------+-----------+------------------+ Vertebral                                   Unable to insonate +----------+-------------+----------+-----------+------------------+ Distal ICA    29.00                                            +----------+-------------+----------+-----------+------------------+  +----------+------------+----------+-----------+------------------+ LEFT TCD  Left VM (cm)Depth (cm)Pulsatility     Comment       +----------+------------+----------+-----------+------------------+ MCA          142.00      5.40      0.79                       +----------+------------+----------+-----------+------------------+ ACA         -148.00                0.53                       +----------+------------+----------+-----------+------------------+ Term ICA     99.00                 0.84                       +----------+------------+----------+-----------+------------------+ PCA          20.00                 1.00                       +----------+------------+----------+-----------+------------------+  Opthalmic    25.00                 1.37                       +----------+------------+----------+-----------+------------------+ ICA siphon   47.00                 1.03                       +----------+------------+----------+-----------+------------------+ Vertebral                                  Unable to insonate +----------+------------+----------+-----------+------------------+ Distal ICA   32.00                                             +----------+------------+----------+-----------+------------------+  +------------+-------+------------------+             VM cm/s     Comment       +------------+-------+------------------+ Prox Basilar       Unable to insonate +------------+-------+------------------+ Dist Basilar       Unable to insonate +------------+-------+------------------+ +----------------------+----+ Right Lindegaard Ratio4.66 +----------------------+----+ +---------------------+----+ Left Lindegaard Ratio4.44 +---------------------+----+  Summary:  elevated MFVs at b/l MCAs, left ACA and terminal ICA suggesting b/l mild to moderate vasospasm. however, the b/l MCA MFVs seem improved from 3 days ago, as well as the b/l Lindegaard ratios. Yet the MFV of left ACA was significant elevated from 3 days ago, further close monitoring warranted. clinical correlation recommended. *See table(s) above for TCD measurements and observations.  Diagnosing physician: Marvel Plan MD Electronically signed by Marvel Plan MD on 05/28/2021 at 2:41:32 PM.    Final    VAS [place] TRANSCRANIAL DOPPLER  Result Date: 05/28/2021  Transcranial Doppler Patient Name:  ANIKEN MONESTIME  Date of Exam:   05/21/2021 Medical Rec #: [id number]         Accession #:    [id number] Date of Birth: 07-Jul-1961         Patient Gender: M Patient Age:   60Y Exam Location:  [facility] Procedure:      VAS [place] TRANSCRANIAL DOPPLER Referring Phys: [id number] Donalee Citrin --------------------------------------------------------------------------------  Indications: Subarachnoid hemorrhage. History: History of drug and alcohol abuse. No other significant history. Comparison Study: No prior study Performing Technologist: Gertie Fey MHA, RDMS, RVT, RDCS  Examination Guidelines: A complete evaluation includes B-mode imaging, spectral Doppler, color Doppler, and power Doppler as needed of all accessible portions of each vessel. Bilateral testing is considered an integral part  of a complete examination. Limited examinations for reoccurring indications may be performed as noted.  +----------+-------------+----------+-----------+------------------+ RIGHT TCD Right VM (cm)Depth (cm)Pulsatility     Comment       +----------+-------------+----------+-----------+------------------+ MCA          154.00       5.00      0.73                       +----------+-------------+----------+-----------+------------------+ ACA          -54.00                 0.74                       +----------+-------------+----------+-----------+------------------+  Term ICA     129.00       5.60      0.74                       +----------+-------------+----------+-----------+------------------+ PCA           21.00                 1.31                       +----------+-------------+----------+-----------+------------------+ Opthalmic     28.00                 1.11                       +----------+-------------+----------+-----------+------------------+ ICA siphon                                  Unable to insonate +----------+-------------+----------+-----------+------------------+ Vertebral    -21.00                 0.64                       +----------+-------------+----------+-----------+------------------+ Distal ICA    26.00                                            +----------+-------------+----------+-----------+------------------+  +----------+------------+----------+-----------+-------+ LEFT TCD  Left VM (cm)Depth (cm)PulsatilityComment +----------+------------+----------+-----------+-------+ MCA          158.00      5.10      0.64            +----------+------------+----------+-----------+-------+ ACA          -37.00                0.59            +----------+------------+----------+-----------+-------+ Term ICA     29.00                 0.75            +----------+------------+----------+-----------+-------+ PCA          25.00                  1.03            +----------+------------+----------+-----------+-------+ Opthalmic    21.00                 1.36            +----------+------------+----------+-----------+-------+ Vertebral    -28.00                0.94            +----------+------------+----------+-----------+-------+ Distal ICA   27.00                                 +----------+------------+----------+-----------+-------+  +------------+-------+------------------+             VM cm/s     Comment       +------------+-------+------------------+ Prox Basilar-65.00                    +------------+-------+------------------+ Dist Basilar       Unable  to insonate +------------+-------+------------------+ +----------------------+---+ Right Lindegaard Ratio5.9 +----------------------+---+ +---------------------+---+ Left Lindegaard Ratio5.9 +---------------------+---+  Summary:  significantly elevated MVF at b/l MCAs and right terminal ICA with b/l elevated Lindegaard ratios indicating mild to moderate b/l MCA vasospam. clinical correlation strongly recommended. *See table(s) above for TCD measurements and observations.  Diagnosing physician: Marvel Plan MD Electronically signed by Marvel Plan MD on 05/28/2021 at 2:36:17 PM.    Final    ECHOCARDIOGRAM LIMITED  Result Date: 05/23/2021    ECHOCARDIOGRAM LIMITED REPORT   Patient Name:   SKIPPER DACOSTA Date of Exam: 05/23/2021 Medical Rec #:  [id number]        Height:       69.0 in Accession #:    [id number]       Weight:       141.3 lb Date of Birth:  1961/08/19        BSA:          1.782 m Patient Age:    60 years         BP:           135/73 mmHg Patient Gender: M                HR:           85 bpm. Exam Location:  Inpatient Procedure: Limited Echo, Cardiac Doppler and Color Doppler STAT ECHO Indications:    Stroke  History:        Patient has no prior history of Echocardiogram examinations.                 Subarachnoid hemorrhage. H/o cocaine  use.  Sonographer:    Ross Ludwig RDCS (AE) Referring Phys: [id number] Jadene Pierini IMPRESSIONS  1. Left ventricular ejection fraction, by estimation, is 60 to 65%. The left ventricle has normal function. The left ventricle has no regional wall motion abnormalities. Left ventricular diastolic parameters were normal.  2. Right ventricular systolic function is normal. The right ventricular size is normal.  3. The mitral valve is normal in structure. No evidence of mitral valve regurgitation. No evidence of mitral stenosis.  4. The aortic valve is tricuspid. Aortic valve regurgitation is not visualized. No aortic stenosis is present.  5. The inferior vena cava is normal in size with greater than 50% respiratory variability, suggesting right atrial pressure of 3 mmHg. FINDINGS  Left Ventricle: Left ventricular ejection fraction, by estimation, is 60 to 65%. The left ventricle has normal function. The left ventricle has no regional wall motion abnormalities. The left ventricular internal cavity size was normal in size. There is  no left ventricular hypertrophy. Left ventricular diastolic parameters were normal. Right Ventricle: The right ventricular size is normal.Right ventricular systolic function is normal. Left Atrium: Left atrial size was normal in size. Right Atrium: Right atrial size was normal in size. Pericardium: There is no evidence of pericardial effusion. Mitral Valve: The mitral valve is normal in structure. No evidence of mitral valve stenosis. Tricuspid Valve: The tricuspid valve is normal in structure. Tricuspid valve regurgitation is trivial. No evidence of tricuspid stenosis. Aortic Valve: The aortic valve is tricuspid. Aortic valve regurgitation is not visualized. No aortic stenosis is present. Pulmonic Valve: The pulmonic valve was normal in structure. Pulmonic valve regurgitation is not visualized. No evidence of pulmonic stenosis. Aorta: The aortic root is normal in size and structure. Venous:  The inferior vena cava is normal in size with greater than 50% respiratory variability, suggesting right atrial pressure of  3 mmHg. IAS/Shunts: No atrial level shunt detected by color flow Doppler. LEFT VENTRICLE PLAX 2D LVIDd:         4.40 cm  Diastology LVIDs:         2.90 cm  LV e' medial:    9.14 cm/s LV PW:         1.10 cm  LV E/e' medial:  8.9 LV IVS:        0.80 cm  LV e' lateral:   10.20 cm/s LVOT diam:     2.00 cm  LV E/e' lateral: 8.0 LVOT Area:     3.14 cm  IVC IVC diam: 1.30 cm LEFT ATRIUM         Index LA diam:    2.90 cm 1.63 cm/m   AORTA Ao Root diam: 3.40 cm Ao Asc diam:  3.10 cm MITRAL VALVE MV Area (PHT): 3.30 cm    SHUNTS MV Decel Time: 230 msec    Systemic Diam: 2.00 cm MV E velocity: 81.50 cm/s MV A velocity: 84.80 cm/s MV E/A ratio:  0.96 Olga Millers MD Electronically signed by Olga Millers MD Signature Date/Time: 05/23/2021/11:48:54 AM    Final    [place] EKG SITE RITE  Result Date: 05/23/2021 If Site Rite image not attached, placement could not be confirmed due to current cardiac rhythm.     Subjective: NO ACUTE EVENTS OR CHANGES OVERNIGHT  Objective: Vitals:   06/17/21 0400 06/17/21 0500 06/17/21 0741 06/17/21 1229  BP: 128/89  (!) 123/94 126/85  Pulse: 96  91 95  Resp: 16  16 18   Temp: 98.2 F (36.8 C)  98.3 F (36.8 C) 98.1 F (36.7 C)  TempSrc: Oral  Oral Oral  SpO2: 100%  99% 100%  Weight:  57 kg    Height:       No intake or output data in the 24 hours ending 06/17/21 1509 Filed Weights   06/14/21 0413 06/16/21 0335 06/17/21 0500  Weight: 59.7 kg 56.1 kg 57 kg    Examination:  General exam: Appears calm and comfortable  Respiratory system: Clear to auscultation. Respiratory effort normal. Cardiovascular system: S1 & S2 heard, RRR. No JVD, murmurs, rubs, gallops or clicks. No pedal edema. Gastrointestinal system: Abdomen is nondistended, soft and nontender. No organomegaly or masses felt. Normal bowel sounds heard. Central nervous system: Alert and  oriented. No focal neurological deficits. Extremities:WWP, NO NEW CONTRACTURES Skin: No rashes, lesions or ulcers Psychiatry: Judgement and insight IMPAIRED, Mood & affect FLAT    Data Reviewed: I have personally reviewed following labs and imaging studies  CBC: Recent Labs  Lab 06/11/21 0821 06/13/21 1009 06/16/21 0340  WBC 6.3 6.2 10.2  NEUTROABS  --  4.1  --   HGB 12.5* 12.6* 13.4  HCT 39.1 37.7* 39.6  MCV 101.3* 99.7 98.5  PLT 411* 344 275   Basic Metabolic Panel: Recent Labs  Lab 06/11/21 0821 06/13/21 1009 06/15/21 0747 06/17/21 0251  NA 145 143 144 143  K 3.9 3.8 5.0 3.5  CL 108 108 106 109  CO2 28 29 30 28   GLUCOSE 116* 102* 111* 124*  BUN 15 16 16 20   CREATININE 0.64 0.58* 0.71 0.56*  CALCIUM 9.8 9.3 9.8 9.5   GFR: Estimated Creatinine Clearance: 80.2 mL/min (A) (by C-G formula based on SCr of 0.56 mg/dL (L)). Liver Function Tests: No results for input(s): AST, ALT, ALKPHOS, BILITOT, PROT, ALBUMIN in the last 168 hours. No results for input(s): LIPASE, AMYLASE in the last 168  hours. No results for input(s): AMMONIA in the last 168 hours. Coagulation Profile: No results for input(s): INR, PROTIME in the last 168 hours. Cardiac Enzymes: No results for input(s): CKTOTAL, CKMB, CKMBINDEX, TROPONINI in the last 168 hours. BNP (last 3 results) No results for input(s): PROBNP in the last 8760 hours. HbA1C: No results for input(s): HGBA1C in the last 72 hours. CBG: Recent Labs  Lab 06/16/21 2042 06/16/21 2352 06/17/21 0424 06/17/21 0739 06/17/21 1226  GLUCAP 132* 136* 132* 142* 138*   Lipid Profile: No results for input(s): CHOL, HDL, LDLCALC, TRIG, CHOLHDL, LDLDIRECT in the last 72 hours. Thyroid Function Tests: No results for input(s): TSH, T4TOTAL, FREET4, T3FREE, THYROIDAB in the last 72 hours. Anemia Panel: No results for input(s): VITAMINB12, FOLATE, FERRITIN, TIBC, IRON, RETICCTPCT in the last 72 hours. Sepsis Labs: No results for input(s):  PROCALCITON, LATICACIDVEN in the last 168 hours.  Recent Results (from the past 240 hour(s))  Surgical PCR screen     Status: None   Collection Time: 06/08/21 11:33 AM   Specimen: Nasal Mucosa; Nasal Swab  Result Value Ref Range Status   MRSA, PCR NEGATIVE NEGATIVE Final   Staphylococcus aureus NEGATIVE NEGATIVE Final    Comment: (NOTE) The Xpert SA Assay (FDA approved for NASAL specimens in patients 71 years of age and older), is one component of a comprehensive surveillance program. It is not intended to diagnose infection nor to guide or monitor treatment. Performed at [facility], [address] [place], [name] [address]          Radiology Studies: IR GASTROSTOMY TUBE MOD SED  Result Date: 06/15/2021 INDICATION: Dysphagia second to subarachnoid hemorrhage post coiling of A-comm aneurysm and VP shunt placement. Please perform percutaneous gastrostomy tube placement for enteric nutrition supplementation purposes. EXAM: PULL TROUGH GASTROSTOMY TUBE PLACEMENT COMPARISON:  None. MEDICATIONS: Ancef 2 gm IV; Antibiotics were administered within 1 hour of the procedure. Glucagon 1 mg IV CONTRAST:  15 cc of Omnipaque 300 administered into the gastric lumen. ANESTHESIA/SEDATION: Moderate (conscious) sedation was employed during this procedure. A total of Versed 1 mg and Fentanyl 50 mcg was administered intravenously. Moderate Sedation Time: 33 minutes. The patient's level of consciousness and vital signs were monitored continuously by radiology nursing throughout the procedure under my direct supervision. FLUOROSCOPY TIME:  6 minutes, 48 seconds (46 mGy) COMPLICATIONS: None immediate. PROCEDURE: Informed written consent was obtained from the patient's family following explanation of the procedure, risks, benefits and alternatives. A time out was performed prior to the initiation of the procedure. Ultrasound scanning was performed to demarcate the edge of the left lobe of the liver.  Maximal barrier sterile technique utilized including caps, mask, sterile gowns, sterile gloves, large sterile drape, hand hygiene and Betadine prep. The left upper quadrant was sterilely prepped and draped. An oral gastric catheter was inserted into the stomach under fluoroscopy. The existing nasogastric feeding tube was removed. The left costal margin and air opacified transverse colon were identified and avoided. Air was injected into the stomach for insufflation and visualization under fluoroscopy. Under sterile conditions a 17 gauge trocar needle was utilized to access the stomach percutaneously beneath the left subcostal margin after the overlying soft tissues were anesthetized with 1% Lidocaine with epinephrine. Special attention was made to avoid the tip of the ventriculoperitoneal catheter tubing. Needle position was confirmed within the stomach with aspiration of air and injection of small amount of contrast. A single T tack was deployed for gastropexy. Over an Amplatz guide  wire, a 9-French sheath was inserted into the stomach. A snare device was utilized to capture the oral gastric catheter. The snare device was pulled retrograde from the stomach up the esophagus and out the oropharynx. The 20-French pull-through gastrostomy was connected to the snare device and pulled antegrade through the oropharynx down the esophagus into the stomach and then through the percutaneous tract external to the patient. The gastrostomy was assembled externally. Contrast injection confirms appropriate positioning within the stomach. Several spot radiographic images were obtained in various obliquities for documentation. Dressings were applied. The patient tolerated procedure well without immediate post procedural complication. FINDINGS: After successful fluoroscopic guided placement, the gastrostomy tube is appropriately positioned with internal disc positioned against the inner ventral wall of the gastric lumen. IMPRESSION:  Successful fluoroscopic insertion of a 20-French pull-through gastrostomy tube. The gastrostomy may be used immediately for medication administration and in 24 hrs for the initiation of feeds. Electronically Signed   By: Simonne Come M.D.   On: 06/15/2021 16:38        Scheduled Meds:  chlorhexidine  15 mL Mouth Rinse BID   Chlorhexidine Gluconate Cloth  6 each Topical Daily   feeding supplement  237 mL Oral BID BM   feeding supplement (PROSource TF)  45 mL Per Tube BID   fiber  1 packet Per Tube BID   free water  200 mL Per Tube Q6H   heparin injection (subcutaneous)  5,000 Units Subcutaneous Q8H   mouth rinse  15 mL Mouth Rinse q12n4p   sodium chloride  1 g Per Tube BID WC   Continuous Infusions:  feeding supplement (JEVITY 1.5 CAL/FIBER) 1,000 mL (06/17/21 1301)     LOS: 29 days    Time spent: 77    Burke Keels, MD [facility]  If 7PM-7AM, please contact night-coverage  06/17/2021, 3:09 PM  pro

## 2021-06-17 NOTE — Progress Notes (Signed)
  NEUROSURGERY PROGRESS NOTE   No issues overnight. G-tube placed yesterday  EXAM:  BP (!) 123/94 (BP Location: Left Arm)   Pulse 91   Temp 98.3 F (36.8 C) (Oral)   Resp 16   Ht 5\' 9"  (1.753 m)   Wt 57 kg   SpO2 99%   BMI 18.56 kg/m   Awake, alert,  Much more verbal today, able to converse in sentences but confused MAE well Shunt incisions c/d/I, staples in place  IMPRESSION:  60 y.o. male s/p SAH and VP shunt. Mental status appears significantly improved today  PLAN: - Cont supportive care/therapy - G-tube feeding - SNF placement   46, MD [facility]

## 2021-06-17 NOTE — Evaluation (Signed)
Speech Language Pathology Evaluation Patient Details Name: [name] [name] MRN: [id number] DOB: 14-Aug-1961 Today's Date: 06/17/2021 Time: 8588-5027 SLP Time Calculation (min) (ACUTE ONLY): 12 min  Problem List:  Patient Active Problem List   Diagnosis Date Noted   Cerebral salt-wasting 05/28/2021   Aneurysmal subarachnoid hemorrhage (HCC) 05/28/2021   Vasospasm of cerebral artery 05/28/2021   Diarrheal stools 05/28/2021   Substance induced mood disorder (HCC) 10/31/2016   Past Medical History:  Past Medical History:  Diagnosis Date   Alcohol abuse    Drug abuse (HCC)    Right rotator cuff tear    Seasonal allergies    Past Surgical History:  Past Surgical History:  Procedure Laterality Date   AMPUTATION Left 05/30/2019   Procedure: REVISION AMPUTATION OF L INDEX FINGER;  Surgeon: Betha Loa, MD;  Location: [facility] OR;  Service: Orthopedics;  Laterality: Left;   ANEURYSM COILING  05/19/2021   Anterior communicating aneurysm   IR ANGIO INTRA EXTRACRAN SEL INTERNAL CAROTID UNI R MOD SED  05/20/2021   IR ANGIOGRAM FOLLOW UP STUDY  05/20/2021   IR GASTROSTOMY TUBE MOD SED  06/15/2021   IR TRANSCATH/EMBOLIZ  05/20/2021   LAPAROSCOPIC REVISION VENTRICULAR-PERITONEAL (V-P) SHUNT Right 06/08/2021   Procedure: LAPAROSCOPIC ASSISTED VENTRICULAR-PERITONEAL (V-P) SHUNT;  Surgeon: Violeta Gelinas, MD;  Location: [facility] OR;  Service: General;  Laterality: Right;   RADIOLOGY WITH ANESTHESIA N/A 05/20/2021   Procedure: IR WITH ANESTHESIA;  Surgeon: Lisbeth Renshaw, MD;  [facility];  Service: Radiology;  Laterality: N/A;   SHOULDER ARTHROSCOPY WITH OPEN ROTATOR CUFF REPAIR AND DISTAL CLAVICLE ACROMINECTOMY Right 07/03/2020   Procedure: SHOULDER ARTHROSCOPY WITH OPEN ROTATOR CUFF REPAIR AND DISTAL CLAVICLE ARTHROSCOPIC DEBRIDEMENT;  Surgeon: Frederico Hamman, MD;  Location: [facility];  Service: Orthopedics;  Laterality: Right;   SHOULDER CLOSED REDUCTION Right 10/28/2020   Procedure:  CLOSED MANIPULATION SHOULDER;  Surgeon: Frederico Hamman, MD;  Location: [facility];  Service: Orthopedics;  Laterality: Right;   toe amputaion Left yrs ago   VENTRICULOPERITONEAL SHUNT N/A 06/08/2021   Procedure: SHUNT INSERTION VENTRICULAR-PERITONEAL;  Surgeon: Lisbeth Renshaw, MD;  Location: [facility] OR;  Service: Neurosurgery;  Laterality: N/A;   HPI:  Pt is a 83 you w/ PMH sinusitis, rotator cuff tear, tobacco, marijuana, and ETOH use, remote hx of crack cocaine use who is being managed for parenchymal hemorrhage within the anterior  left frontal lobe. Mild surrounding edema within the anterior left  frontal lobe, now s/p ventriculostomy. Ventric removed and shunt placed 6/14.   Assessment / Plan / Recommendation Clinical Impression  Pt known to this pt from dysphagia intervention. His attention and initiation has significantly improved from admission and he is now responding to questions 75% of the time and following commands intermittently with additional time, repetition and cues. he was not oriented to place, time or situation and all aspects of awareness are reduced. Language when responding to questions was within functional limits but dysarthric due to low intensity.He typically does not express his needs and responses are general in nature given cognitive deficits. ST will continue moving toward communicative-cognitive intervention.    SLP Assessment  SLP Recommendation/Assessment: Patient needs continued Speech Lanaguage Pathology Services SLP Visit Diagnosis: Cognitive communication deficit (R41.841);Dysarthria and anarthria (R47.1)    Follow Up Recommendations  Skilled Nursing facility    Frequency and Duration min 2x/week  2 weeks      SLP Evaluation Cognition  Overall Cognitive Status: Impaired/Different from baseline Arousal/Alertness: Awake/alert Orientation Level: Oriented to place;Disoriented to  place;Disoriented to time;Disoriented to situation Attention:  Sustained Sustained Attention: Impaired Sustained Attention Impairment: Verbal basic;Functional basic Memory: Impaired Memory Impairment: Decreased recall of new information;Decreased short term memory Awareness: Impaired Awareness Impairment: Intellectual impairment;Emergent impairment;Anticipatory impairment Problem Solving: Impaired Problem Solving Impairment: Functional basic;Verbal basic Behaviors: Restless Safety/Judgment: Impaired       Comprehension  Auditory Comprehension Overall Auditory Comprehension: Appears within functional limits for tasks assessed (impacted by cog deficits) Visual Recognition/Discrimination Discrimination: Not tested Reading Comprehension Reading Status: Not tested    Expression Expression Primary Mode of Expression: Verbal Verbal Expression Overall Verbal Expression: Impaired (from cognition) Initiation: Impaired Pragmatics: Impairment Impairments: Eye contact Interfering Components: Attention Written Expression Dominant Hand: Right Written Expression: Not tested   Oral / Motor  Oral Motor/Sensory Function Overall Oral Motor/Sensory Function: Mild impairment Facial ROM: Reduced left Facial Symmetry: Abnormal symmetry left Motor Speech Overall Motor Speech: Impaired Respiration: Within functional limits Phonation: Low vocal intensity Resonance: Within functional limits Articulation: Within functional limitis Intelligibility: Intelligibility reduced Word: 75-100% accurate Phrase: 50-74% accurate Sentence: 50-74% accurate Motor Planning: Witnin functional limits   GO                    Royce Macadamia 06/17/2021, 11:54 AM Breck Coons Lonell Face.Ed Nurse, children's [phone] Office [phone]

## 2021-06-18 LAB — GLUCOSE, CAPILLARY
Glucose-Capillary: 143 mg/dL — ABNORMAL HIGH (ref 70–99)
Glucose-Capillary: 136 mg/dL — ABNORMAL HIGH (ref 70–99)
Glucose-Capillary: 125 mg/dL — ABNORMAL HIGH (ref 70–99)
Glucose-Capillary: 116 mg/dL — ABNORMAL HIGH (ref 70–99)
Glucose-Capillary: 116 mg/dL — ABNORMAL HIGH (ref 70–99)

## 2021-06-18 NOTE — Progress Notes (Signed)
PROGRESS NOTE    [name] [name]  WUJ:[id number] DOB: [birth year] DOA: 05/19/2021 PCP: Pcp, No   Brief Narrative:  [name] [name] is an 60 y.o. male past medical history past medical history of alcohol abuse and drug abuse who suffered a subarachnoid hemorrhage status post coiling of the anterior communicating aneurysm, underwent VP shunting on 06/09/2019   Significant Events: 5/25 Admitted with [facility] secondary to ruptured aneurysm, ventriculostomy preformed in ED, Ancef started 5/26 Coil Embolization 5/29 worsening neurological exam consistent with vasospasm, hemodynamic augmentation initiated. 6/1 worsening hyponatremia: Na of 120.  Patient started on salt tablets 1 g twice daily and fludrocortisone 0.1 mg. 6/2 BP goal reduced 160 -180.  Salt Tablet increased to 1 g tid. Fludrocortisone increased 0.2 mg. 6/8 Ongoing agitation, intermittently following commands. CT Head with worsening hydrocephalus despite EVD, open to 10 per NSGY. 6/9 EVD remains in place, repeat head CT as below 6/10-12, no significant neuro changes, intermittently agitated.  Ongoing EVD, plans for VP shunt next week 6/13 no neuro changes, remains intermittently restless 6/14 went for VP shunt, no neuro changes, afebrile 6/21 status post PEG tube placement nutrition started 24 hours postplacement.   Awaiting skilled nursing facility placement   Assessment & Plan:   Active Problems:   Cerebral salt-wasting   Aneurysmal subarachnoid hemorrhage (HCC)   Vasospasm of cerebral artery   Diarrheal stools   Acute metabolic encephalopathy subarachnoid hemorrhage secondary to aneurysm rupture: No significant changes Obstructive hydrocephalus status post EVD on 06/08/2021. Not able to tolerate his orals. PEG tube placement on 06/15/2021. Continue aspiration precaution and seizure precautions.  He is currently on Keppra for seizure prophylaxis. Physical therapy evaluated the patient, recommended skilled nursing facility.    Hyponatremia suspect cerebral salt wasting syndrome: Resolved discontinued salt tablets.  Diarrhea: Continue loperamide.  Resolved  Physical deconditioning in the setting of subarachnoid hemorrhage: Will need to go to skilled nursing facility.   Nutrition: Continue nutrition per core track. IR placed PEG on 06/16/2021. BMP 6/25  Leukocytosis: Resolved, Likely stress margination has remained afebrile,  No indication for antibiotics.  DVT prophylaxis: SCD/Compression stockings  Code Status: FULL    Code Status Orders  (From admission, onward)           Start     Ordered   05/19/21 2022  Full code  Continuous        05/19/21 2022           Code Status History     Date Active Date Inactive Code Status Order ID Comments User Context   10/31/2016 2152 11/01/2016 1945 Full Code [id number]  Clifton Custard Inpatient   10/31/2016 1308 10/31/2016 2047 Full Code [id number]  Dionne Ano ED   06/13/2013 1417 06/13/2013 2022 Full Code [id number]  Emilia Beck, PA-C ED      Family Communication: called mother left message Disposition Plan:   SNF Consults called: None Admission status: Inpatient Status is: Inpatient   Remains inpatient appropriate because:Hemodynamically unstable   Dispo: The patient is from: Home              Anticipated d/c is to: Skilled nursing facility              Patient currently is not medically stable to d/c.              Difficult to place patient No   Consultants:  NSU, IR  Procedures:  CT ABDOMEN WO CONTRAST  Result Date: 06/14/2021 CLINICAL DATA:  Evaluate anatomy for percutaneous gastrostomy tube placement. History of intracranial aneurysm and hemorrhage. EXAM: CT ABDOMEN WITHOUT CONTRAST TECHNIQUE: Multidetector CT imaging of the abdomen was performed following the standard protocol without IV contrast. COMPARISON:  None. FINDINGS: Lower chest: Few dependent densities at the right lung base. Otherwise, the lung  bases are clear. There appears to be a small hiatal hernia. Hepatobiliary: Normal appearance of the liver and gallbladder. No significant biliary dilatation. Pancreas: Unremarkable. No pancreatic ductal dilatation or surrounding inflammatory changes. Spleen: Normal in size without focal abnormality. Adrenals/Urinary Tract: Normal appearance of the adrenal glands. Normal appearance of both kidneys without stones or hydronephrosis. Stomach/Bowel: Nasogastric tube extends in the stomach and terminates near the duodenal bulb. The stomach is cephalad to the transverse colon. No significant bowel dilatation. No focal bowel inflammation. Vascular/Lymphatic: Atherosclerotic calcifications in the abdominal aorta without aneurysm. No significant lymph node enlargement in the abdomen. Other: VP shunt enters through the anterior right upper abdomen. There is a small amount of gas around the shunt tubing as it enters the abdominal cavity. There is also free air within the anterior upper abdomen likely secondary to the shunt placement. The shunt tubing coils around the right side of the abdomen and terminates in the mid upper abdomen adjacent to the mid transverse colon. No significant free fluid in the abdomen. Areas of subcutaneous edema in the anterior abdomen could be related to injection sites. Musculoskeletal: Sclerosis involving the anterior and mid L2 vertebral body is likely related to degenerative changes. No suspicious bone findings. IMPRESSION: 1. Stomach is located in the anterior upper abdomen and anatomy should be amendable for percutaneous gastrostomy tube placement. 2. Pneumoperitoneum which is likely secondary to recent placement of VP shunt. 3. Probable small hiatal hernia. Electronically Signed   By: Richarda Overlie M.D.   On: 06/14/2021 08:09   CT ANGIO HEAD W OR WO CONTRAST  Result Date: 05/23/2021 CLINICAL DATA:  Neuro deficit, acute stroke suspected. Aneurysmal subarachnoid hemorrhage with suspected  symptomatic vasospasm. EXAM: CT ANGIOGRAPHY HEAD TECHNIQUE: Multidetector CT imaging of the head was performed using the standard protocol during bolus administration of intravenous contrast. Multiplanar CT image reconstructions and MIPs were obtained to evaluate the vascular anatomy. CONTRAST:  51mL OMNIPAQUE IOHEXOL 350 MG/ML SOLN COMPARISON:  CT head May 20, 2021.  CTA May 19, 2021. FINDINGS: CT HEAD Brain: A right frontal ventriculostomy catheter with tip in similar position. Tube there is similar volume of intraventricular hemorrhage i with similar ventriculomegaly. Similar intraparenchymal hemorrhage in the left greater than right inferior frontal lobes, likely related to anterior communicating artery rupture. There is similar surrounding edema and subarachnoid hemorrhage. Additional areas of convexity subarachnoid hemorrhage have slightly decreased. No evidence of interval acute large vascular territory infarct. No progressive mass effect. . Vascular: See below. Skull: No acute fracture. Sinuses: Visualized sinuses are clear. Other: No mastoid effusions. CTA HEAD Anterior circulation: Bilateral ICAs are patent. Similar mild calcific atherosclerosis of the paraclinoid ICAs. Status post anterior communicating artery aneurysm coiling. Streak artifact largely obscures this region, including adjacent distal A1 and proximal A2 ACAs and the paraclinoid ICAs. The visualized MCAs and ACAs are patent. There is new fairly diffuse multifocal moderate irregularity and narrowing of the MCAs and ACAs. Posterior circulation: The intradural vertebral arteries are patent with mild narrowing. Patent basilar artery and bilateral posterior cerebral arteries. Venous sinuses: As permitted by contrast timing, patent. Review of the MIP images confirms the above findings. IMPRESSION: CT head: 1. Similar intraparenchymal and intraventricular hemorrhage in  this patient with suspected prior anterior communicating artery rupture. No  progressive ventriculomegaly or mass effect. Slightly improved convexity subarachnoid hemorrhage. 2. No evidence of acute interval large vascular territory infarct. MRI could provide more sensitive evaluation for acute infarct if clinically indicated. CTA 1. Multifocal moderate irregularity and narrowing of patent MCAs and ACAs bilaterally that is best appreciated on MIP imaging and compatible with vasospasm. Catheter arteriogram could further characterize if clinically indicated. 2. Status post anterior communicating artery aneurysm coiling with streak artifact obscuring this area. Electronically Signed   By: Feliberto Harts MD   On: 05/23/2021 18:13   CT Angio Head W or Wo Contrast  Result Date: 05/19/2021 CLINICAL DATA:  Patient found down.  Intracranial hemorrhage. EXAM: CT ANGIOGRAPHY HEAD AND NECK TECHNIQUE: Multidetector CT imaging of the head and neck was performed using the standard protocol during bolus administration of intravenous contrast. Multiplanar CT image reconstructions and MIPs were obtained to evaluate the vascular anatomy. Carotid stenosis measurements (when applicable) are obtained utilizing NASCET criteria, using the distal internal carotid diameter as the denominator. CONTRAST:  50mL OMNIPAQUE IOHEXOL 350 MG/ML SOLN COMPARISON:  Noncontrast head CT performed earlier today 05/19/2021. FINDINGS: CTA NECK FINDINGS Aortic arch: Standard aortic branching. The visualized aortic arch is normal in caliber. No hemodynamically significant innominate or proximal subclavian artery stenosis. Right carotid system: CCA and ICA patent within the neck without stenosis. No significant atherosclerotic disease. Left carotid system: CCA and ICA patent within the neck. Minimal soft and calcified plaque within the proximal left ICA results in less than 50% stenosis. Vertebral arteries: Codominant and patent within the neck without stenosis. Skeleton: Cervic advanced cervical spondylosis. Please refer to same  day noncontrast CT of the cervical spine for a complete description of cervical spine findings. Other neck: No neck mass or cervical lymphadenopathy. Thyroid unremarkable. Upper chest: No consolidation within the imaged lung apices. Review of the MIP images confirms the above findings CTA HEAD FINDINGS Evaluation is somewhat limited due to suboptimal arterial contrast enhancement. Anterior circulation: The intracranial internal carotid arteries are patent. Mild nonstenotic calcified plaque within the left cavernous and paraclinoid segments. The M1 middle cerebral arteries are patent. No M2 proximal branch occlusion or high-grade proximal M2 stenosis is identified. The anterior cerebral arteries are patent. The anterior cerebral arteries are patent. 3 mm aneurysm arising from the anterior communicating artery complex (series 7, image 118) (series 8, image 102). Posterior circulation: The intracranial vertebral arteries are patent. The basilar artery is patent. The posterior cerebral arteries are patent. Posterior communicating arteries are hypoplastic or absent bilaterally. Venous sinuses: Within the limitations of contrast timing, no convincing thrombus. Anatomic variants: As described Review of the MIP images confirms the above findings These results were called by telephone at the time of interpretation on 05/19/2021 at 7:58 pm to provider Lorre Nick , who verbally acknowledged these results. IMPRESSION: CTA neck: The common carotid, internal carotid and vertebral arteries are patent within the neck without hemodynamically significant stenosis. Minimal soft and calcified plaque within the proximal left ICA. CTA head: 1. Somewhat limited examination due to suboptimal arterial enhancement. 2. 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 3. No intracranial large vessel occlusion or proximal high-grade arterial stenosis.  Electronically Signed   By: Jackey Loge DO   On: 05/19/2021 19:59   CT HEAD WO CONTRAST  Addendum Date: 06/02/2021   ADDENDUM REPORT: 06/02/2021 11:46 ADDENDUM: Findings and recommendations discussed  with Dr. Merrily Pew via telephone at 11:42 a.m. Electronically Signed   By: Feliberto Harts MD   On: 06/02/2021 11:46   Result Date: 06/02/2021 CLINICAL DATA:  Hydrocephalus. EXAM: CT HEAD WITHOUT CONTRAST TECHNIQUE: Contiguous axial images were obtained from the base of the skull through the vertex without intravenous contrast. COMPARISON:  CT May 23, 2020. FINDINGS: Brain: Significantly increased ventriculomegaly, compatible with worsening hydrocephalus. Right frontal approach ventriculostomy catheter with the tip at the right lateral ventricle near the foramen of Monro, similar to prior. Visualized portions of the shunt catheter appear intact. Small (7 mm) focus of hyperdense hemorrhage in the left pericallosal frontal lobe (series 3, image 19 and series 6, image 38) is concerning for a new/interval acute hemorrhage given no definite hyperdense hemorrhage at this site on the prior, although a direct comparison is limited given evolving blood products. Otherwise, decreased size and density of intraparenchymal hemorrhage in the inferior left frontal lobe and corpus callosum. Hypodensity in these areas likely represents edema and developing encephalomalacia. Small volume of surrounding subarachnoid hemorrhage, decreased. No evidence of interval acute large vascular territory infarct. Decreased layering intraventricular hemorrhage. Significantly increased size of the lateral and third ventricles, compatible with worsening hydrocephalus. There is rounding of the temporal horns and outward convexity of the third ventricle. Vascular: No hyperdense vessel identified. Sequela of left anterior communicating artery aneurysm coiling. Surrounding streak artifact limits evaluation in this region. Skull: Right frontal burr hole.   No acute fracture. Sinuses/Orbits: Visualized sinuses are clear. Other: No mastoid effusions. IMPRESSION: 1. Significantly increased ventriculomegaly, compatible with worsening hydrocephalus. 2. Small (7 mm) focus of hyperdense hemorrhage in the left pericallosal frontal lobe is concerning for a new/interval site of acute hemorrhage given no definite hyperdense hemorrhage at this site on the prior, although a direct comparison is limited given evolving blood products. Recommend attention on close interval follow-up. 3. Otherwise, improving intraparenchymal, subarachnoid, and intraventricular hemorrhage with edema and suspected developing encephalomalacia in the inferior left frontal lobe and corpus callosum. Electronically Signed: By: Feliberto Harts MD On: 06/02/2021 11:33   CT HEAD WO CONTRAST  Result Date: 05/20/2021 CLINICAL DATA:  Subarachnoid hemorrhage, altered mental status, follow-up examination EXAM: CT HEAD WITHOUT CONTRAST TECHNIQUE: Contiguous axial images were obtained from the base of the skull through the vertex without intravenous contrast. COMPARISON:  CTA and head CT 05/19/2021 FINDINGS: Brain: Interval right frontal ventriculostomy has been placed with its tip in the region of the foramina [place]. Intraparenchymal hematoma within the a periventricular left frontal lobe anteriorly with extensive hemorrhage layering upon the corpus callosum, and extending into the ventricles bilaterally is again seen and appears stable since prior examination mild subarachnoid hemorrhage within the para falcine sulci of the vertex, sylvian fissures and within the suprasellar cistern appears stable. Interval coil embolization of an expected anterior communicating artery aneurysm. Stable borderline ventriculomegaly. No acute infarct. No significant mass effect or midline shift. Cerebellum is unremarkable. Vascular: No asymmetric hyperdense vasculature. Skull: No acute fracture. Sinuses/Orbits: Visualized orbits  are unremarkable. The paranasal sinuses are clear. Other: Mastoid air cells and middle ear cavities are clear. IMPRESSION: Stable extensive subarachnoid, intraparenchymal, and intraventricular hemorrhage with extensive hemorrhage laying along the corpus callosum. No significant associated mass effect or midline shift. Stable borderline ventriculomegaly. Right frontal ventriculostomy has been placed in the interval with its tip in expected position in the region of the foramina [place]. Interval coil embolization of expected anterior communicating artery aneurysm. Electronically Signed   By: Helyn Numbers MD  On: 05/20/2021 21:36   CT Head Wo Contrast  Result Date: 05/19/2021 CLINICAL DATA:  Cerebral hemorrhage suspected. Additional history provided: Altered mental status, patient found down. EXAM: CT HEAD WITHOUT CONTRAST TECHNIQUE: Contiguous axial images were obtained from the base of the skull through the vertex without intravenous contrast. COMPARISON:  Head CT examinations 05/10/2021 at FINDINGS: Brain: Moderate volume acute hemorrhage is present within the ventricles, most notably within the right lateral and third ventricles. Small volume dependent hemorrhage is also present within the occipital horn of the left lateral ventricle. Prominence of the lateral and third ventricles compatible with moderate hydrocephalus. Overall moderate to large volume acute subarachnoid hemorrhage tracking along the anterior and superior aspect of the corpus callosum, within the septum pellucidum, as well as within the anterior interhemispheric fissure, left greater than right MCA cisterns and suprasellar cistern. Additional trace scattered acute subarachnoid hemorrhage along the bilateral cerebral hemispheres. Additionally, there is acute parenchymal hemorrhage within the anterior left frontal lobe measuring 2.8 x 1.1 cm. Mild surrounding edema within the anterior left frontal lobe, and also likely within the left callosal  body/genu. Trace acute hemorrhage is also present along the falx. No evidence of intracranial mass. No midline shift. Vascular: No hyperdense vessel.  Atherosclerotic calcifications. Skull: Normal. Negative for fracture or focal lesion. Sinuses/Orbits: Visualized orbits show no acute finding. Trace bilateral ethmoid sinus mucosal thickening. These results were called by telephone at the time of interpretation on 05/19/2021 at 7:05 pm to provider Lorre Nick , who verbally acknowledged these results. IMPRESSION: 1. Moderate volume acute intraventricular hemorrhage, as described. Associated prominence of the lateral and third ventricles compatible with moderate hydrocephalus. 2. Overall moderate to large acute subarachnoid hemorrhage, as described and in a distribution suspicious for aneurysm rupture. CTA head/neck recommended for further evaluation. 3. 2.8 x 1.1 cm acute parenchymal hemorrhage within the anterior left frontal lobe. Mild surrounding edema within the anterior left frontal lobe, and also likely within the left callosal body/genu. 4. Additional trace acute hemorrhage along the falx. Electronically Signed   By: Jackey Loge DO   On: 05/19/2021 19:07   CT ANGIO NECK W OR WO CONTRAST  Result Date: 05/19/2021 CLINICAL DATA:  Patient found down.  Intracranial hemorrhage. EXAM: CT ANGIOGRAPHY HEAD AND NECK TECHNIQUE: Multidetector CT imaging of the head and neck was performed using the standard protocol during bolus administration of intravenous contrast. Multiplanar CT image reconstructions and MIPs were obtained to evaluate the vascular anatomy. Carotid stenosis measurements (when applicable) are obtained utilizing NASCET criteria, using the distal internal carotid diameter as the denominator. CONTRAST:  44mL OMNIPAQUE IOHEXOL 350 MG/ML SOLN COMPARISON:  Noncontrast head CT performed earlier today 05/19/2021. FINDINGS: CTA NECK FINDINGS Aortic arch: Standard aortic branching. The visualized aortic arch  is normal in caliber. No hemodynamically significant innominate or proximal subclavian artery stenosis. Right carotid system: CCA and ICA patent within the neck without stenosis. No significant atherosclerotic disease. Left carotid system: CCA and ICA patent within the neck. Minimal soft and calcified plaque within the proximal left ICA results in less than 50% stenosis. Vertebral arteries: Codominant and patent within the neck without stenosis. Skeleton: Cervic advanced cervical spondylosis. Please refer to same day noncontrast CT of the cervical spine for a complete description of cervical spine findings. Other neck: No neck mass or cervical lymphadenopathy. Thyroid unremarkable. Upper chest: No consolidation within the imaged lung apices. Review of the MIP images confirms the above findings CTA HEAD FINDINGS Evaluation is somewhat limited due to suboptimal arterial  contrast enhancement. Anterior circulation: The intracranial internal carotid arteries are patent. Mild nonstenotic calcified plaque within the left cavernous and paraclinoid segments. The M1 middle cerebral arteries are patent. No M2 proximal branch occlusion or high-grade proximal M2 stenosis is identified. The anterior cerebral arteries are patent. The anterior cerebral arteries are patent. 3 mm aneurysm arising from the anterior communicating artery complex (series 7, image 118) (series 8, image 102). Posterior circulation: The intracranial vertebral arteries are patent. The basilar artery is patent. The posterior cerebral arteries are patent. Posterior communicating arteries are hypoplastic or absent bilaterally. Venous sinuses: Within the limitations of contrast timing, no convincing thrombus. Anatomic variants: As described Review of the MIP images confirms the above findings These results were called by telephone at the time of interpretation on 05/19/2021 at 7:58 pm to provider Lorre Nick , who verbally acknowledged these results.  IMPRESSION: CTA neck: The common carotid, internal carotid and vertebral arteries are patent within the neck without hemodynamically significant stenosis. Minimal soft and calcified plaque within the proximal left ICA. CTA head: 1. Somewhat limited examination due to suboptimal arterial enhancement. 2. 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 3. No intracranial large vessel occlusion or proximal high-grade arterial stenosis. Electronically Signed   By: Jackey Loge DO   On: 05/19/2021 19:59   CT Cervical Spine Wo Contrast  Result Date: 05/19/2021 CLINICAL DATA:  Found down/unresponsive. EXAM: CT CERVICAL SPINE WITHOUT CONTRAST TECHNIQUE: Multidetector CT imaging of the cervical spine was performed without intravenous contrast. Multiplanar CT image reconstructions were also generated. COMPARISON:  MRI of the cervical spine of 06/19/2020. Today's head CT is dictated separately (by neuroradiology). FINDINGS: Alignment: Spinal visualization through the top of T3. Straightening of expected cervical lordosis. Maintenance of vertebral body height. Mild anterolisthesis of C4 on C5 is similar to the prior MRI, given differences in technique. Skull base and vertebrae: Skull base intact. Coronal reformats demonstrate a normal C1-C2 articulation. Facets are well-aligned. Right-sided C3-4 facets are partially fused. Advanced spondylosis at C4 through C7 with endplate osteophytes, loss of intervertebral disc height. Soft tissues and spinal canal: Prevertebral soft tissues are within normal limits. Disc levels: Neural foraminal narrowing bilaterally at C3-4 and C4-5 secondary to uncovertebral joint hypertrophy. Right neural foraminal and central canal stenosis at C5-6 secondary to disc osteophyte complex and uncovertebral joint hypertrophy. Bilateral neural foraminal and central canal narrowing at C6-7 secondary to above factors.  Upper chest: No apical pneumothorax. Other: None. IMPRESSION: Advanced cervical spondylosis, as detailed above. No acute superimposed process. Nonspecific straightening of expected cervical lordosis. Electronically Signed   By: Jeronimo Greaves M.D.   On: 05/19/2021 18:42   IR GASTROSTOMY TUBE MOD SED  Result Date: 06/15/2021 INDICATION: Dysphagia second to subarachnoid hemorrhage post coiling of A-comm aneurysm and VP shunt placement. Please perform percutaneous gastrostomy tube placement for enteric nutrition supplementation purposes. EXAM: PULL TROUGH GASTROSTOMY TUBE PLACEMENT COMPARISON:  None. MEDICATIONS: Ancef 2 gm IV; Antibiotics were administered within 1 hour of the procedure. Glucagon 1 mg IV CONTRAST:  15 cc of Omnipaque 300 administered into the gastric lumen. ANESTHESIA/SEDATION: Moderate (conscious) sedation was employed during this procedure. A total of Versed 1 mg and Fentanyl 50 mcg was administered intravenously. Moderate Sedation Time: 33 minutes. The patient's level of consciousness and vital signs were monitored continuously by radiology nursing throughout the procedure under my direct supervision. FLUOROSCOPY TIME:  6 minutes, 48 seconds (46 mGy) COMPLICATIONS: None immediate.  PROCEDURE: Informed written consent was obtained from the patient's family following explanation of the procedure, risks, benefits and alternatives. A time out was performed prior to the initiation of the procedure. Ultrasound scanning was performed to demarcate the edge of the left lobe of the liver. Maximal barrier sterile technique utilized including caps, mask, sterile gowns, sterile gloves, large sterile drape, hand hygiene and Betadine prep. The left upper quadrant was sterilely prepped and draped. An oral gastric catheter was inserted into the stomach under fluoroscopy. The existing nasogastric feeding tube was removed. The left costal margin and air opacified transverse colon were identified and avoided. Air was  injected into the stomach for insufflation and visualization under fluoroscopy. Under sterile conditions a 17 gauge trocar needle was utilized to access the stomach percutaneously beneath the left subcostal margin after the overlying soft tissues were anesthetized with 1% Lidocaine with epinephrine. Special attention was made to avoid the tip of the ventriculoperitoneal catheter tubing. Needle position was confirmed within the stomach with aspiration of air and injection of small amount of contrast. A single T tack was deployed for gastropexy. Over an Amplatz guide wire, a 9-French sheath was inserted into the stomach. A snare device was utilized to capture the oral gastric catheter. The snare device was pulled retrograde from the stomach up the esophagus and out the oropharynx. The 20-French pull-through gastrostomy was connected to the snare device and pulled antegrade through the oropharynx down the esophagus into the stomach and then through the percutaneous tract external to the patient. The gastrostomy was assembled externally. Contrast injection confirms appropriate positioning within the stomach. Several spot radiographic images were obtained in various obliquities for documentation. Dressings were applied. The patient tolerated procedure well without immediate post procedural complication. FINDINGS: After successful fluoroscopic guided placement, the gastrostomy tube is appropriately positioned with internal disc positioned against the inner ventral wall of the gastric lumen. IMPRESSION: Successful fluoroscopic insertion of a 20-French pull-through gastrostomy tube. The gastrostomy may be used immediately for medication administration and in 24 hrs for the initiation of feeds. Electronically Signed   By: Simonne Come M.D.   On: 06/15/2021 16:38   IR Transcath/Emboliz  Result Date: 05/20/2021 PROCEDURE: DIAGNOSTIC CEREBRAL ANGIOGRAM COIL EMBOLIZATION OF ANTERIOR COMMUNICATING ARTERY ANEURYSM HISTORY: The  patient is a 60 year old man presenting to the hospital after acute mental status change. His initial workup included CT scan demonstrating significant intraventricular hemorrhage and left-sided subarachnoid hemorrhage. CT angiogram was done demonstrating likelihood of an anterior communicating artery aneurysm. Ventriculostomy was placed and the patient's neurologic status did improve overnight. He therefore presents now for diagnostic cerebral angiogram with possible aneurysm embolization. ACCESS: The technical aspects of the procedure as well as its potential risks and benefits were reviewed with the patient's mother. These risks included but were not limited bleeding, infection, allergic reaction, damage to organs or vital structures, stroke, non-diagnostic procedure, and the catastrophic outcomes of heart attack, coma, and death. With an understanding of these risks, informed consent was obtained and witnessed. The patient was placed in the supine position on the angiography table and the skin of right groin prepped in the usual sterile fashion. Case was done under general endotracheal anesthesia monitored by the anesthesia service. MEDICATIONS: HEPARIN: 0 Units total. CONTRAST:  cc, Omnipaque 300 FLUOROSCOPY TIME:  FLUOROSCOPY TIME: See IR records TECHNIQUE: CATHETERS AND WIRES 5-French JB-1 catheter 180 cm 0.035" glidewire 6-French NeuronMax guide sheath 6-French Berenstein Select JB-1 catheter 0.058" CatV guidecatheter 150 cm XT 27 microcatheter Synchro 2 select soft  microwire Excelsior XT-17 microcatheter COILS USED Target 360 XL 3 mm x 6 cm VESSELS CATHETERIZED Right internal carotid Left internal carotid Left vertebral Right common femoral Right anterior cerebral artery VESSELS STUDIED Right internal carotid, head Left internal carotid, head Left vertebral Right internal carotid artery, head (during embolization) Right internal carotid artery, head (immediate post-embolization) Right internal carotid  artery, head (final control) Right common femoral PROCEDURAL NARRATIVE A 5-Fr JB-1 glide catheter was advanced over a 0.035 glidewire into the aortic arch. The above vessels were then sequentially catheterized and cervical / cerebral angiograms taken. After review of images, the catheter was removed without incident. The 5-Fr sheath was then exchanged over the glidewire for an 8-Fr sheath. Under real-time fluoroscopy, the guide sheath was advanced over the select catheter and glidewire into the descending aorta. The select catheter was then advanced into the cervical right internal carotid artery. The guide sheath was then advanced into the proximal cervical right internal carotid artery. The Select catheter was removed and the 058 guide catheter was coaxially introduced over the microcatheter and microwire. The microcatheter was then advanced under roadmap guidance into the cavernous internal carotid artery. The guide catheter was then tracked into the cavernous segment of the internal carotid artery. The microwire was then used to select the right middle cerebral artery and the 27 microcatheter was advanced into the M1-M2 junction. This allowed tracking of the guide catheter into the supraclinoid internal carotid artery. Fluoro phase run was taken which demonstrated significant contrast stasis within the right carotid distribution suggesting significant catheter induced spasm. I therefore withdrew the guide catheter into the cavernous segment of the internal carotid artery. Another fluoro phase run demonstrated improved flow through the right carotid circulation. At this point the 27 catheter was removed and the 17 catheter was introduced over the same microwire. Initially, the microwire was advanced into the A2 segment which allowed advancement of the microcatheter into the right A1. Microwire was then withdrawn and the aneurysm lumen was catheterized. The above coil was then deployed. Run taken demonstrated  occlusion of the dome of the aneurysm, with a small neck residual. There was significant for contrast stasis within the right ICA territory again suggesting significant likely catheter-related spasm. I therefore elected to complete the procedure at this point, with good dome protection of the aneurysm and minimal neck residual. The microcatheter was removed. Immediate post embolization angiogram was taken. The guide catheter and guide sheath were then withdrawn down into the proximal cervical internal carotid artery. Final control angiogram was taken. The guide catheter and guide sheath were then synchronously removed without incident. FINDINGS: Right internal carotid, head: Injection reveals the presence of a widely patent ICA, M1, and A1 segments and their branches. There is an aneurysm projecting towards the left arising from the right A1 A2 junction. This aneurysm has a relatively narrow neck, with a dome measuring approximately 3.0 x 2.6 mm. There is significant narrowing of the supraclinoid internal carotid artery and M1 and A1 segments likely reflecting vasospasm. The parenchymal and venous phases are normal. The venous sinuses are widely patent. Left internal carotid, head: Injection reveals the presence of a widely patent ICA, A1, and M1 segments and their branches. There is significant narrowing consistent with vasospasm involving the supraclinoid ICA as well as the first segment its of the anterior and middle cerebral arteries. No left-sided carotid aneurysms are seen. The parenchymal and venous phases are normal. The venous sinuses are widely patent. Left vertebral: Injection reveals the presence of  a widely patent vertebral artery. This leads to a widely patent basilar artery that terminates in bilateral P1. The basilar apex is normal. No aneurysms, AVMs, or high-flow fistulas are seen. There is no significant vasospasm of the basilar artery or posterior circulation. The parenchymal and venous phases  are normal. The venous sinuses are widely patent. Right internal carotid artery, head (during embolization): Injection reveals the presence of a widely patent ICA that leads to a patent ACA and MCA, although there is significant vasospasm involving the right A1 and M1. Coil mass within the aneurysm is stable, without coil prolapse or filling defect to suggest thrombus. Aneurysm appears to be occluded. Right internal carotid artery, head (immediate post-embolization): Injection reveals the presence of a widely patent ICA that leads to a patent ACA and MCA. Coil mass within the aneurysm is stable, without coil prolapse or filling defect to suggest thrombus. No aneurysm filling is seen. There remains significant vasospasm involving the supraclinoid internal carotid artery as well as the A1 and M1 segments. Right internal carotid artery, head (final control): Injection reveals the presence of a widely patent ICA that leads to a patent ACA and MCA. No thrombus is visualized. Coil mass is seen within the aneurysm and is in stable position. There is continued spasm involving the supraclinoid internal carotid artery, A1, and M1 segments. Capillary phase however does not demonstrate any perfusion deficits. Venous sinuses are patent. Right femoral: Normal vessel. No significant atherosclerotic disease. Arterial sheath in adequate position. DISPOSITION: Upon completion of the study, the femoral sheath was removed and hemostasis obtained using a 7-Fr ExoSeal closure device. Good proximal and distal lower extremity pulses were documented upon achievement of hemostasis. The procedure was well tolerated and no early complications were observed. The patient was transferred to the postanesthesia care unit in stable hemodynamic condition. IMPRESSION: 1. 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 2. There is moderate vasospasm involving the bilateral supraclinoid internal carotid arteries, and bilateral  A1 and M1 segments which does not appear to be flow limiting. The preliminary results of this procedure were shared with the patient's family. Electronically Signed   By: Lisbeth RenshawNeelesh  Nundkumar   On: 05/20/2021 16:23   IR Angiogram Follow Up Study  Result Date: 05/20/2021 PROCEDURE: DIAGNOSTIC CEREBRAL ANGIOGRAM COIL EMBOLIZATION OF ANTERIOR COMMUNICATING ARTERY ANEURYSM HISTORY: The patient is a 60 year old man presenting to the hospital after acute mental status change. His initial workup included CT scan demonstrating significant intraventricular hemorrhage and left-sided subarachnoid hemorrhage. CT angiogram was done demonstrating likelihood of an anterior communicating artery aneurysm. Ventriculostomy was placed and the patient's neurologic status did improve overnight. He therefore presents now for diagnostic cerebral angiogram with possible aneurysm embolization. ACCESS: The technical aspects of the procedure as well as its potential risks and benefits were reviewed with the patient's mother. These risks included but were not limited bleeding, infection, allergic reaction, damage to organs or vital structures, stroke, non-diagnostic procedure, and the catastrophic outcomes of heart attack, coma, and death. With an understanding of these risks, informed consent was obtained and witnessed. The patient was placed in the supine position on the angiography table and the skin of right groin prepped in the usual sterile fashion. Case was done under general endotracheal anesthesia monitored by the anesthesia service. MEDICATIONS: HEPARIN: 0 Units total. CONTRAST:  cc, Omnipaque 300 FLUOROSCOPY TIME:  FLUOROSCOPY TIME: See IR records TECHNIQUE: CATHETERS AND WIRES 5-French JB-1 catheter 180 cm 0.035" glidewire 6-French NeuronMax guide sheath 6-French GoogleBerenstein Select  JB-1 catheter 0.058" CatV guidecatheter 150 cm XT 27 microcatheter Synchro 2 select soft microwire Excelsior XT-17 microcatheter COILS USED Target 360  XL 3 mm x 6 cm VESSELS CATHETERIZED Right internal carotid Left internal carotid Left vertebral Right common femoral Right anterior cerebral artery VESSELS STUDIED Right internal carotid, head Left internal carotid, head Left vertebral Right internal carotid artery, head (during embolization) Right internal carotid artery, head (immediate post-embolization) Right internal carotid artery, head (final control) Right common femoral PROCEDURAL NARRATIVE A 5-Fr JB-1 glide catheter was advanced over a 0.035 glidewire into the aortic arch. The above vessels were then sequentially catheterized and cervical / cerebral angiograms taken. After review of images, the catheter was removed without incident. The 5-Fr sheath was then exchanged over the glidewire for an 8-Fr sheath. Under real-time fluoroscopy, the guide sheath was advanced over the select catheter and glidewire into the descending aorta. The select catheter was then advanced into the cervical right internal carotid artery. The guide sheath was then advanced into the proximal cervical right internal carotid artery. The Select catheter was removed and the 058 guide catheter was coaxially introduced over the microcatheter and microwire. The microcatheter was then advanced under roadmap guidance into the cavernous internal carotid artery. The guide catheter was then tracked into the cavernous segment of the internal carotid artery. The microwire was then used to select the right middle cerebral artery and the 27 microcatheter was advanced into the M1-M2 junction. This allowed tracking of the guide catheter into the supraclinoid internal carotid artery. Fluoro phase run was taken which demonstrated significant contrast stasis within the right carotid distribution suggesting significant catheter induced spasm. I therefore withdrew the guide catheter into the cavernous segment of the internal carotid artery. Another fluoro phase run demonstrated improved flow through the  right carotid circulation. At this point the 27 catheter was removed and the 17 catheter was introduced over the same microwire. Initially, the microwire was advanced into the A2 segment which allowed advancement of the microcatheter into the right A1. Microwire was then withdrawn and the aneurysm lumen was catheterized. The above coil was then deployed. Run taken demonstrated occlusion of the dome of the aneurysm, with a small neck residual. There was significant for contrast stasis within the right ICA territory again suggesting significant likely catheter-related spasm. I therefore elected to complete the procedure at this point, with good dome protection of the aneurysm and minimal neck residual. The microcatheter was removed. Immediate post embolization angiogram was taken. The guide catheter and guide sheath were then withdrawn down into the proximal cervical internal carotid artery. Final control angiogram was taken. The guide catheter and guide sheath were then synchronously removed without incident. FINDINGS: Right internal carotid, head: Injection reveals the presence of a widely patent ICA, M1, and A1 segments and their branches. There is an aneurysm projecting towards the left arising from the right A1 A2 junction. This aneurysm has a relatively narrow neck, with a dome measuring approximately 3.0 x 2.6 mm. There is significant narrowing of the supraclinoid internal carotid artery and M1 and A1 segments likely reflecting vasospasm. The parenchymal and venous phases are normal. The venous sinuses are widely patent. Left internal carotid, head: Injection reveals the presence of a widely patent ICA, A1, and M1 segments and their branches. There is significant narrowing consistent with vasospasm involving the supraclinoid ICA as well as the first segment its of the anterior and middle cerebral arteries. No left-sided carotid aneurysms are seen. The parenchymal and venous phases are  normal. The venous sinuses  are widely patent. Left vertebral: Injection reveals the presence of a widely patent vertebral artery. This leads to a widely patent basilar artery that terminates in bilateral P1. The basilar apex is normal. No aneurysms, AVMs, or high-flow fistulas are seen. There is no significant vasospasm of the basilar artery or posterior circulation. The parenchymal and venous phases are normal. The venous sinuses are widely patent. Right internal carotid artery, head (during embolization): Injection reveals the presence of a widely patent ICA that leads to a patent ACA and MCA, although there is significant vasospasm involving the right A1 and M1. Coil mass within the aneurysm is stable, without coil prolapse or filling defect to suggest thrombus. Aneurysm appears to be occluded. Right internal carotid artery, head (immediate post-embolization): Injection reveals the presence of a widely patent ICA that leads to a patent ACA and MCA. Coil mass within the aneurysm is stable, without coil prolapse or filling defect to suggest thrombus. No aneurysm filling is seen. There remains significant vasospasm involving the supraclinoid internal carotid artery as well as the A1 and M1 segments. Right internal carotid artery, head (final control): Injection reveals the presence of a widely patent ICA that leads to a patent ACA and MCA. No thrombus is visualized. Coil mass is seen within the aneurysm and is in stable position. There is continued spasm involving the supraclinoid internal carotid artery, A1, and M1 segments. Capillary phase however does not demonstrate any perfusion deficits. Venous sinuses are patent. Right femoral: Normal vessel. No significant atherosclerotic disease. Arterial sheath in adequate position. DISPOSITION: Upon completion of the study, the femoral sheath was removed and hemostasis obtained using a 7-Fr ExoSeal closure device. Good proximal and distal lower extremity pulses were documented upon achievement of  hemostasis. The procedure was well tolerated and no early complications were observed. The patient was transferred to the postanesthesia care unit in stable hemodynamic condition. IMPRESSION: 1. 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 2. There is moderate vasospasm involving the bilateral supraclinoid internal carotid arteries, and bilateral A1 and M1 segments which does not appear to be flow limiting. The preliminary results of this procedure were shared with the patient's family. Electronically Signed   By: Lisbeth Renshaw   On: 05/20/2021 16:23   DG CHEST PORT 1 VIEW  Result Date: 05/23/2021 CLINICAL DATA:  Subarachnoid hemorrhage EXAM: PORTABLE CHEST 1 VIEW COMPARISON:  May 19, 2021 FINDINGS: The cardiomediastinal silhouette is unchanged in contour.Enteric tube tip projects over the proximal duodenum. No pleural effusion. No pneumothorax. LEFT basilar linear opacity. Visualized abdomen is unremarkable. Multilevel degenerative changes of the thoracic spine. IMPRESSION: LEFT basilar linear opacity with differential considerations including infection or atelectasis. Electronically Signed   By: Meda Klinefelter MD   On: 05/23/2021 11:38   DG Chest Port 1 View  Result Date: 05/19/2021 CLINICAL DATA:  60 year old male with shortness of breath. EXAM: PORTABLE CHEST 1 VIEW COMPARISON:  None. FINDINGS: No focal consolidation, pleural effusion, or pneumothorax. Mild cardiomegaly. No acute osseous pathology. IMPRESSION: No active disease. Electronically Signed   By: Elgie Collard M.D.   On: 05/19/2021 20:29   DG Abd Portable 1V  Result Date: 05/21/2021 CLINICAL DATA:  Feeding tube placement. EXAM: PORTABLE ABDOMEN - 1 VIEW COMPARISON:  None. FINDINGS: Bowel gas pattern is normal. Small bore feeding tube terminates in the distal stomach. Lung bases are clear. IMPRESSION: Small bore feeding tube terminates in the distal stomach. Electronically Signed   By: Cristal Deer  Mattern M.D.   On: 05/21/2021 12:22   ECHOCARDIOGRAM COMPLETE  Result Date: 05/26/2021    ECHOCARDIOGRAM REPORT   Patient Name:   COLYN MIRON Date of Exam: 05/26/2021 Medical Rec #:  [id number]        Height:       69.0 in Accession #:    [id number]       Weight:       136.5 lb Date of Birth:  04-Jul-1961        BSA:          1.756 m Patient Age:    60 years         BP:           184/87 mmHg Patient Gender: M                HR:           91 bpm. Exam Location:  Inpatient Procedure: 2D Echo, 3D Echo, Color Doppler and Cardiac Doppler Indications:    I42.9 Cardiomyopathy (unspecified)  History:        Patient has prior history of Echocardiogram examinations, most                 recent 05/23/2021.  Sonographer:    Irving Burton Senior RDCS Referring Phys: [id number] RAVI AGARWALA IMPRESSIONS  1. Left ventricular ejection fraction by 3D volume is 63 %. The left ventricle has normal function. The left ventricle has no regional wall motion abnormalities. Left ventricular diastolic parameters are consistent with Grade I diastolic dysfunction (impaired relaxation).  2. Right ventricular systolic function is normal. The right ventricular size is normal. Tricuspid regurgitation signal is inadequate for assessing PA pressure.  3. The mitral valve is normal in structure. Trivial mitral valve regurgitation. No evidence of mitral stenosis.  4. The aortic valve is tricuspid. Aortic valve regurgitation is not visualized. No aortic stenosis is present.  5. The inferior vena cava is normal in size with greater than 50% respiratory variability, suggesting right atrial pressure of 3 mmHg. FINDINGS  Left Ventricle: Left ventricular ejection fraction by 3D volume is 63 %. The left ventricle has normal function. The left ventricle has no regional wall motion abnormalities. The left ventricular internal cavity size was normal in size. There is no left  ventricular hypertrophy. Left ventricular diastolic parameters are consistent with Grade I  diastolic dysfunction (impaired relaxation). Right Ventricle: The right ventricular size is normal. No increase in right ventricular wall thickness. Right ventricular systolic function is normal. Tricuspid regurgitation signal is inadequate for assessing PA pressure. Left Atrium: Left atrial size was normal in size. Right Atrium: Right atrial size was normal in size. Pericardium: There is no evidence of pericardial effusion. Mitral Valve: The mitral valve is normal in structure. Trivial mitral valve regurgitation. No evidence of mitral valve stenosis. Tricuspid Valve: The tricuspid valve is normal in structure. Tricuspid valve regurgitation is not demonstrated. No evidence of tricuspid stenosis. Aortic Valve: The aortic valve is tricuspid. Aortic valve regurgitation is not visualized. No aortic stenosis is present. Pulmonic Valve: The pulmonic valve was normal in structure. Pulmonic valve regurgitation is trivial. No evidence of pulmonic stenosis. Aorta: The aortic root is normal in size and structure. Venous: The inferior vena cava is normal in size with greater than 50% respiratory variability, suggesting right atrial pressure of 3 mmHg. IAS/Shunts: No atrial level shunt detected by color flow Doppler.  LEFT VENTRICLE PLAX 2D LVIDd:         4.30 cm  Diastology LVIDs:         2.90 cm         LV e' medial:    7.62 cm/s LV PW:         0.90 cm         LV E/e' medial:  9.3 LV IVS:        0.60 cm         LV e' lateral:   10.00 cm/s LVOT diam:     2.00 cm         LV E/e' lateral: 7.1 LV SV:         70 LV SV Index:   40 LVOT Area:     3.14 cm        3D Volume EF                                LV 3D EF:    Left                                             ventricular                                             ejection                                             fraction by                                             3D volume                                             is 63 %.                                 3D  Volume EF:                                3D EF:        63 %                                LV EDV:       124 ml                                LV ESV:       46 ml                                LV SV:  77 ml RIGHT VENTRICLE RV S prime:     7.72 cm/s TAPSE (M-mode): 1.8 cm LEFT ATRIUM             Index       RIGHT ATRIUM           Index LA diam:        3.90 cm 2.22 cm/m  RA Area:     11.20 cm LA Vol (A2C):   38.0 ml 21.64 ml/m RA Volume:   22.40 ml  12.75 ml/m LA Vol (A4C):   40.2 ml 22.89 ml/m LA Biplane Vol: 39.7 ml 22.61 ml/m  AORTIC VALVE LVOT Vmax:   140.00 cm/s LVOT Vmean:  95.000 cm/s LVOT VTI:    0.223 m  AORTA Ao Root diam: 3.30 cm Ao Asc diam:  3.10 cm MITRAL VALVE MV Area (PHT): 3.21 cm    SHUNTS MV Decel Time: 236 msec    Systemic VTI:  0.22 m MV E velocity: 71.00 cm/s  Systemic Diam: 2.00 cm MV A velocity: 94.00 cm/s MV E/A ratio:  0.76 Weston Brass MD Electronically signed by Weston Brass MD Signature Date/Time: 05/26/2021/9:20:19 PM    Final    VAS [place] TRANSCRANIAL DOPPLER  Result Date: 06/04/2021  Transcranial Doppler Patient Name:  HOLLAND NICKSON  Date of Exam:   06/02/2021 Medical Rec #: [id number]         Accession #:    [id number] Date of Birth: October 30, 1961         Patient Gender: M Patient Age:   60Y Exam [facility]:  [facility] Procedure:      VAS [place] TRANSCRANIAL DOPPLER Referring Phys: [id number] Donalee Citrin --------------------------------------------------------------------------------  Indications: Subarachnoid hemorrhage. Limitations: Patient unable to remain stationary for duration of examination. Comparison Study: Most recent prior study 05-31-2021 Performing Technologist: Jean Rosenthal RDMS,RVT  Examination Guidelines: A complete evaluation includes B-mode imaging, spectral Doppler, color Doppler, and power Doppler as needed of all accessible portions of each vessel. Bilateral testing is considered an integral part of a complete examination. Limited examinations for  reoccurring indications may be performed as noted.  +----------+-------------+----------+-----------+-------+ RIGHT TCD Right VM (cm)Depth (cm)PulsatilityComment +----------+-------------+----------+-----------+-------+ MCA          119.00       5.50      0.72            +----------+-------------+----------+-----------+-------+ ACA          -31.00                 0.68            +----------+-------------+----------+-----------+-------+ Term ICA     111.00       5.60      0.67            +----------+-------------+----------+-----------+-------+ PCA           38.00                 0.67            +----------+-------------+----------+-----------+-------+ Opthalmic     29.00                 1.24            +----------+-------------+----------+-----------+-------+ ICA siphon    29.00                 1.17            +----------+-------------+----------+-----------+-------+ Vertebral    -20.00  0.79            +----------+-------------+----------+-----------+-------+ Distal ICA    23.00                 0.82            +----------+-------------+----------+-----------+-------+  +----------+------------+----------+-----------+-------+ LEFT TCD  Left VM (cm)Depth (cm)PulsatilityComment +----------+------------+----------+-----------+-------+ MCA          130.00      4.10      0.86            +----------+------------+----------+-----------+-------+ ACA          -26.00                0.32            +----------+------------+----------+-----------+-------+ Term ICA     36.00       5.70      0.79            +----------+------------+----------+-----------+-------+ PCA          25.00                 1.16            +----------+------------+----------+-----------+-------+ Opthalmic    27.00                 1.01            +----------+------------+----------+-----------+-------+ ICA siphon   34.00                 1.29             +----------+------------+----------+-----------+-------+ Vertebral    -22.00                0.61            +----------+------------+----------+-----------+-------+ Distal ICA   20.00                 0.86            +----------+------------+----------+-----------+-------+  +------------+-------+------------------+             VM cm/s     Comment       +------------+-------+------------------+ Prox Basilar       Unable to insonate +------------+-------+------------------+ +----------------------+----+ Right Lindegaard Ratio5.17 +----------------------+----+ +---------------------+----+ Left Lindegaard Ratio6.50 +---------------------+----+  Summary:  Elevated mean flow velocities in bilateral middle cerebral and terminal right internal carotid arteries suggestive of mild vasospasm. *See table(s) above for TCD measurements and observations.  Diagnosing physician: Delia Heady MD Electronically signed by Delia Heady MD on 06/04/2021 at 12:01:08 PM.    Final    VAS [place] TRANSCRANIAL DOPPLER  Result Date: 05/31/2021  Transcranial Doppler Patient Name:  ASHUR GLATFELTER  Date of Exam:   05/31/2021 Medical Rec #: [id number]         Accession #:    [id number] Date of Birth: [birth year]         Patient Gender: M Patient Age:   60Y Exam Location:  [facility] Procedure:      VAS [place] TRANSCRANIAL DOPPLER Referring Phys: [id number] Donalee Citrin --------------------------------------------------------------------------------  Indications: Subarachnoid hemorrhage. Comparison Study: 05/28/21- TCD Performing Technologist: Gertie Fey MHA, RDMS, RVT, RDCS  Examination Guidelines: A complete evaluation includes B-mode imaging, spectral Doppler, color Doppler, and power Doppler as needed of all accessible portions of each vessel. Bilateral testing is considered an integral part of a complete examination. Limited examinations for reoccurring indications may be performed as noted.   +----------+-------------+----------+-----------+------------------+ RIGHT TCD Right VM (cm)Depth (cm)Pulsatility  Comment       +----------+-------------+----------+-----------+------------------+ MCA           92.00       4.70      0.96                       +----------+-------------+----------+-----------+------------------+ ACA          -26.00                 0.75                       +----------+-------------+----------+-----------+------------------+ Term ICA      21.00                 1.26                       +----------+-------------+----------+-----------+------------------+ PCA           18.00                 1.21                       +----------+-------------+----------+-----------+------------------+ Opthalmic     20.00                 1.24                       +----------+-------------+----------+-----------+------------------+ ICA siphon                                  Unable to insonate +----------+-------------+----------+-----------+------------------+ Vertebral                                   Unable to insonate +----------+-------------+----------+-----------+------------------+ Distal ICA    36.00                                            +----------+-------------+----------+-----------+------------------+  +----------+------------+----------+-----------+------------------+ LEFT TCD  Left VM (cm)Depth (cm)Pulsatility     Comment       +----------+------------+----------+-----------+------------------+ MCA          93.00                 0.99                       +----------+------------+----------+-----------+------------------+ ACA                                        Unable to insonate +----------+------------+----------+-----------+------------------+ Term ICA     46.00                 0.84                       +----------+------------+----------+-----------+------------------+ PCA                                         Unable to insonate +----------+------------+----------+-----------+------------------+ Opthalmic    24.00  1.24                       +----------+------------+----------+-----------+------------------+ ICA siphon                                 Unable to insonate +----------+------------+----------+-----------+------------------+ Vertebral                                  Unable to insonate +----------+------------+----------+-----------+------------------+ Distal ICA   28.00                                            +----------+------------+----------+-----------+------------------+  +------------+-------+------------------+             VM cm/s     Comment       +------------+-------+------------------+ Prox Basilar       Unable to insonate +------------+-------+------------------+ +----------------------+----+ Right Lindegaard Ratio2.56 +----------------------+----+ +---------------------+----+ Left Lindegaard Ratio3.32 +---------------------+----+  Summary:  Mildly elevared bilateral midle cerebral artery mean flow velocities of unclear significance.Not all vessels could be studied due to technical difficulties. *See table(s) above for TCD measurements and observations.  Diagnosing physician: Delia Heady MD Electronically signed by Delia Heady MD on 05/31/2021 at 4:39:43 PM.    Final    VAS [place] TRANSCRANIAL DOPPLER  Result Date: 05/28/2021  Transcranial Doppler Patient Name:  TALIN FEISTER  Date of Exam:   05/28/2021 Medical Rec #: [id number]         Accession #:    [id number] Date of Birth: 10/04/1961         Patient Gender: M Patient Age:   60Y Exam Location:  [facility] Procedure:      VAS [place] TRANSCRANIAL DOPPLER Referring Phys: [id number] Donalee Citrin --------------------------------------------------------------------------------  Indications: Subarachnoid hemorrhage. 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 Limitations: Patient movement throughout the exam Limitations for diagnostic windows: Unable to insonate right transtemporal window. Unable to insonate left transtemporal window. Unable to insonate occipital window. Performing Technologist: Marilynne Halsted RDMS, RVT  Examination Guidelines: A complete evaluation includes B-mode imaging, spectral Doppler, color Doppler, and power Doppler as needed of all accessible portions of each vessel. Bilateral testing is considered an integral part of a complete examination. Limited examinations for reoccurring indications may be performed as noted.  +----------+-------------+----------+-----------+-------------+ RIGHT TCD Right VM (cm)Depth (cm)Pulsatility   Comment    +----------+-------------+----------+-----------+-------------+ MCA          155.00       5.60      0.71                  +----------+-------------+----------+-----------+-------------+ ACA                                         not insonated +----------+-------------+----------+-----------+-------------+ Term ICA                                    not insonated +----------+-------------+----------+-----------+-------------+ PCA           30.00  0.70                  +----------+-------------+----------+-----------+-------------+ Opthalmic                                   not insonated +----------+-------------+----------+-----------+-------------+ ICA siphon                                  not insonated +----------+-------------+----------+-----------+-------------+ Vertebral                                   not insonated +----------+-------------+----------+-----------+-------------+ Distal ICA                                  not insonated +----------+-------------+----------+-----------+-------------+  +----------+------------+----------+-----------+-------------+ LEFT TCD  Left VM (cm)Depth (cm)Pulsatility   Comment     +----------+------------+----------+-----------+-------------+ MCA          166.00      4.20      0.81                  +----------+------------+----------+-----------+-------------+ ACA                                        not insonated +----------+------------+----------+-----------+-------------+ Term ICA                                   not insonated +----------+------------+----------+-----------+-------------+ PCA          54.00                 0.83                  +----------+------------+----------+-----------+-------------+ Opthalmic                                  not insonated +----------+------------+----------+-----------+-------------+ ICA siphon                                 not insonated +----------+------------+----------+-----------+-------------+ Vertebral                                  not insonated +----------+------------+----------+-----------+-------------+ Distal ICA                                 not insonated +----------+------------+----------+-----------+-------------+  +------------+-------+-------------+             VM cm/s   Comment    +------------+-------+-------------+ Prox Basilar       not insonated +------------+-------+-------------+ Dist Basilar       not insonated +------------+-------+-------------+ Summary:  patient movement limited today's study. only b/l MCA and PCA were able to recorded. with this limited data, still has elevated b/l MCA MVFs, not significantly changed from prior, still suggesting moderate vasospasm b/l MCAs. clinical correlation recommended. *See table(s) above for TCD measurements and observations.  Diagnosing  physician: Marvel Plan MD Electronically signed by Marvel Plan MD on 05/28/2021 at 2:46:59 PM.    Final    VAS [place] TRANSCRANIAL DOPPLER  Result Date: 05/28/2021  Transcranial Doppler Patient Name:  MAXDEN NAJI  Date of Exam:   05/26/2021 Medical Rec #: [id number]          Accession #:    [id number] Date of Birth: 07-21-1961         Patient Gender: M Patient Age:   60Y Exam Location:  [facility] Procedure:      VAS [place] TRANSCRANIAL DOPPLER Referring Phys: [id number] Donalee Citrin --------------------------------------------------------------------------------  Indications: Subarachnoid hemorrhage. Limitations: Patient position/ lines/ drains Comparison Study: 05/24/21 Performing Technologist: Jeb Levering RDMS, RVT  Examination Guidelines: A complete evaluation includes B-mode imaging, spectral Doppler, color Doppler, and power Doppler as needed of all accessible portions of each vessel. Bilateral testing is considered an integral part of a complete examination. Limited examinations for reoccurring indications may be performed as noted.  +----------+-------------+----------+-----------+--------------+ RIGHT TCD Right VM (cm)Depth (cm)Pulsatility   Comment     +----------+-------------+----------+-----------+--------------+ MCA          128.00                 0.84                   +----------+-------------+----------+-----------+--------------+ ACA          -144.00                0.67                   +----------+-------------+----------+-----------+--------------+ Term ICA      67.00                 0.98                   +----------+-------------+----------+-----------+--------------+ PCA           29.00                 0.96                   +----------+-------------+----------+-----------+--------------+ Opthalmic     22.00                 0.95                   +----------+-------------+----------+-----------+--------------+ ICA siphon                                  not visualized +----------+-------------+----------+-----------+--------------+ Vertebral    -52.00                                        +----------+-------------+----------+-----------+--------------+ Distal ICA    38.00                                         +----------+-------------+----------+-----------+--------------+  +----------+------------+----------+-----------+--------------+ LEFT TCD  Left VM (cm)Depth (cm)Pulsatility   Comment     +----------+------------+----------+-----------+--------------+ MCA          156.00                0.76                   +----------+------------+----------+-----------+--------------+  ACA          -55.00                0.76                   +----------+------------+----------+-----------+--------------+ Term ICA     38.00                 0.95                   +----------+------------+----------+-----------+--------------+ PCA          30.00                 0.96                   +----------+------------+----------+-----------+--------------+ Opthalmic    20.00                 1.73                   +----------+------------+----------+-----------+--------------+ ICA siphon                                 not visualized +----------+------------+----------+-----------+--------------+ Vertebral    -53.00                                       +----------+------------+----------+-----------+--------------+ Distal ICA   33.00                                        +----------+------------+----------+-----------+--------------+  +------------+-------+-------+             VM cm/sComment +------------+-------+-------+ Prox Basilar-58.00         +------------+-------+-------+ Dist Basilar-47.00         +------------+-------+-------+ +----------------------+----+ Right Lindegaard Ratio3.37 +----------------------+----+ +---------------------+----+ Left Lindegaard Ratio4.73 +---------------------+----+  Summary:  elevated b/l MCA MFVs as well as b/l Lindegaard ratios no significant or slightly improved from prior, still suggesting mild to moderate vasospasm more on the left. right ACA MFV also elevated this time which was not present on prior study. However, left  ACA  elevated MFV seems resolved. continue monitoring warranted. clinical correlation recommended. *See table(s) above for TCD measurements and observations.  Diagnosing physician: Marvel Plan MD Electronically signed by Marvel Plan MD on 05/28/2021 at 2:45:06 PM.    Final    VAS [place] TRANSCRANIAL DOPPLER  Result Date: 05/28/2021  Transcranial Doppler Patient Name:  ALLISON SILVA  Date of Exam:   05/24/2021 Medical Rec #: [id number]         Accession #:    [id number] Date of Birth: [birth year]         Patient Gender: M Patient Age:   60Y Exam Location:  [facility] Procedure:      VAS [place] TRANSCRANIAL DOPPLER Referring Phys: [id number] Donalee Citrin --------------------------------------------------------------------------------  Indications: Subarachnoid hemorrhage. Limitations: Patient unable to cooperate; combative. Limitations for diagnostic windows: Unable to insonate occipital window. Comparison Study: 05/21/21- TCD Performing Technologist: Gertie Fey MHA, RDMS, RVT, RDCS  Examination Guidelines: A complete evaluation includes B-mode imaging, spectral Doppler, color Doppler, and power Doppler as needed of all accessible portions of each vessel. Bilateral testing is considered an integral part of a complete examination. Limited examinations for reoccurring indications may  be performed as noted.  +----------+-------------+----------+-----------+------------------+ RIGHT TCD Right VM (cm)Depth (cm)Pulsatility     Comment       +----------+-------------+----------+-----------+------------------+ MCA          135.00       5.40      0.94                       +----------+-------------+----------+-----------+------------------+ ACA          -38.00                 1.13                       +----------+-------------+----------+-----------+------------------+ Term ICA      21.00                 1.20                       +----------+-------------+----------+-----------+------------------+ PCA            46.00                 0.90                       +----------+-------------+----------+-----------+------------------+ Opthalmic     17.00                 1.27                       +----------+-------------+----------+-----------+------------------+ ICA siphon    21.00                  1.2                       +----------+-------------+----------+-----------+------------------+ Vertebral                                   Unable to insonate +----------+-------------+----------+-----------+------------------+ Distal ICA    29.00                                            +----------+-------------+----------+-----------+------------------+  +----------+------------+----------+-----------+------------------+ LEFT TCD  Left VM (cm)Depth (cm)Pulsatility     Comment       +----------+------------+----------+-----------+------------------+ MCA          142.00      5.40      0.79                       +----------+------------+----------+-----------+------------------+ ACA         -148.00                0.53                       +----------+------------+----------+-----------+------------------+ Term ICA     99.00                 0.84                       +----------+------------+----------+-----------+------------------+ PCA          20.00                 1.00                       +----------+------------+----------+-----------+------------------+  Opthalmic    25.00                 1.37                       +----------+------------+----------+-----------+------------------+ ICA siphon   47.00                 1.03                       +----------+------------+----------+-----------+------------------+ Vertebral                                  Unable to insonate +----------+------------+----------+-----------+------------------+ Distal ICA   32.00                                             +----------+------------+----------+-----------+------------------+  +------------+-------+------------------+             VM cm/s     Comment       +------------+-------+------------------+ Prox Basilar       Unable to insonate +------------+-------+------------------+ Dist Basilar       Unable to insonate +------------+-------+------------------+ +----------------------+----+ Right Lindegaard Ratio4.66 +----------------------+----+ +---------------------+----+ Left Lindegaard Ratio4.44 +---------------------+----+  Summary:  elevated MFVs at b/l MCAs, left ACA and terminal ICA suggesting b/l mild to moderate vasospasm. however, the b/l MCA MFVs seem improved from 3 days ago, as well as the b/l Lindegaard ratios. Yet the MFV of left ACA was significant elevated from 3 days ago, further close monitoring warranted. clinical correlation recommended. *See table(s) above for TCD measurements and observations.  Diagnosing physician: Marvel Plan MD Electronically signed by Marvel Plan MD on 05/28/2021 at 2:41:32 PM.    Final    VAS [place] TRANSCRANIAL DOPPLER  Result Date: 05/28/2021  Transcranial Doppler Patient Name:  SHRAVAN SALAHUDDIN  Date of Exam:   05/21/2021 Medical Rec #: [id number]         Accession #:    [id number] Date of Birth: Mar 09, 1961         Patient Gender: M Patient Age:   60Y Exam Location:  [facility] Procedure:      VAS [place] TRANSCRANIAL DOPPLER Referring Phys: [id number] Donalee Citrin --------------------------------------------------------------------------------  Indications: Subarachnoid hemorrhage. History: History of drug and alcohol abuse. No other significant history. Comparison Study: No prior study Performing Technologist: Gertie Fey MHA, RDMS, RVT, RDCS  Examination Guidelines: A complete evaluation includes B-mode imaging, spectral Doppler, color Doppler, and power Doppler as needed of all accessible portions of each vessel. Bilateral testing is considered an integral part  of a complete examination. Limited examinations for reoccurring indications may be performed as noted.  +----------+-------------+----------+-----------+------------------+ RIGHT TCD Right VM (cm)Depth (cm)Pulsatility     Comment       +----------+-------------+----------+-----------+------------------+ MCA          154.00       5.00      0.73                       +----------+-------------+----------+-----------+------------------+ ACA          -54.00                 0.74                       +----------+-------------+----------+-----------+------------------+  Term ICA     129.00       5.60      0.74                       +----------+-------------+----------+-----------+------------------+ PCA           21.00                 1.31                       +----------+-------------+----------+-----------+------------------+ Opthalmic     28.00                 1.11                       +----------+-------------+----------+-----------+------------------+ ICA siphon                                  Unable to insonate +----------+-------------+----------+-----------+------------------+ Vertebral    -21.00                 0.64                       +----------+-------------+----------+-----------+------------------+ Distal ICA    26.00                                            +----------+-------------+----------+-----------+------------------+  +----------+------------+----------+-----------+-------+ LEFT TCD  Left VM (cm)Depth (cm)PulsatilityComment +----------+------------+----------+-----------+-------+ MCA          158.00      5.10      0.64            +----------+------------+----------+-----------+-------+ ACA          -37.00                0.59            +----------+------------+----------+-----------+-------+ Term ICA     29.00                 0.75            +----------+------------+----------+-----------+-------+ PCA          25.00                  1.03            +----------+------------+----------+-----------+-------+ Opthalmic    21.00                 1.36            +----------+------------+----------+-----------+-------+ Vertebral    -28.00                0.94            +----------+------------+----------+-----------+-------+ Distal ICA   27.00                                 +----------+------------+----------+-----------+-------+  +------------+-------+------------------+             VM cm/s     Comment       +------------+-------+------------------+ Prox Basilar-65.00                    +------------+-------+------------------+ Dist Basilar       Unable  to insonate +------------+-------+------------------+ +----------------------+---+ Right Lindegaard Ratio5.9 +----------------------+---+ +---------------------+---+ Left Lindegaard Ratio5.9 +---------------------+---+  Summary:  significantly elevated MVF at b/l MCAs and right terminal ICA with b/l elevated Lindegaard ratios indicating mild to moderate b/l MCA vasospam. clinical correlation strongly recommended. *See table(s) above for TCD measurements and observations.  Diagnosing physician: Marvel Plan MD Electronically signed by Marvel Plan MD on 05/28/2021 at 2:36:17 PM.    Final    ECHOCARDIOGRAM LIMITED  Result Date: 05/23/2021    ECHOCARDIOGRAM LIMITED REPORT   Patient Name:   ORTON CAPELL Date of Exam: 05/23/2021 Medical Rec #:  [id number]        Height:       69.0 in Accession #:    [id number]       Weight:       141.3 lb Date of Birth:  04/13/1961        BSA:          1.782 m Patient Age:    60 years         BP:           135/73 mmHg Patient Gender: M                HR:           85 bpm. Exam Location:  Inpatient Procedure: Limited Echo, Cardiac Doppler and Color Doppler STAT ECHO Indications:    Stroke  History:        Patient has no prior history of Echocardiogram examinations.                 Subarachnoid hemorrhage. H/o cocaine  use.  Sonographer:    Ross Ludwig RDCS (AE) Referring Phys: [id number] Jadene Pierini IMPRESSIONS  1. Left ventricular ejection fraction, by estimation, is 60 to 65%. The left ventricle has normal function. The left ventricle has no regional wall motion abnormalities. Left ventricular diastolic parameters were normal.  2. Right ventricular systolic function is normal. The right ventricular size is normal.  3. The mitral valve is normal in structure. No evidence of mitral valve regurgitation. No evidence of mitral stenosis.  4. The aortic valve is tricuspid. Aortic valve regurgitation is not visualized. No aortic stenosis is present.  5. The inferior vena cava is normal in size with greater than 50% respiratory variability, suggesting right atrial pressure of 3 mmHg. FINDINGS  Left Ventricle: Left ventricular ejection fraction, by estimation, is 60 to 65%. The left ventricle has normal function. The left ventricle has no regional wall motion abnormalities. The left ventricular internal cavity size was normal in size. There is  no left ventricular hypertrophy. Left ventricular diastolic parameters were normal. Right Ventricle: The right ventricular size is normal.Right ventricular systolic function is normal. Left Atrium: Left atrial size was normal in size. Right Atrium: Right atrial size was normal in size. Pericardium: There is no evidence of pericardial effusion. Mitral Valve: The mitral valve is normal in structure. No evidence of mitral valve stenosis. Tricuspid Valve: The tricuspid valve is normal in structure. Tricuspid valve regurgitation is trivial. No evidence of tricuspid stenosis. Aortic Valve: The aortic valve is tricuspid. Aortic valve regurgitation is not visualized. No aortic stenosis is present. Pulmonic Valve: The pulmonic valve was normal in structure. Pulmonic valve regurgitation is not visualized. No evidence of pulmonic stenosis. Aorta: The aortic root is normal in size and structure. Venous:  The inferior vena cava is normal in size with greater than 50% respiratory variability, suggesting right atrial pressure of  3 mmHg. IAS/Shunts: No atrial level shunt detected by color flow Doppler. LEFT VENTRICLE PLAX 2D LVIDd:         4.40 cm  Diastology LVIDs:         2.90 cm  LV e' medial:    9.14 cm/s LV PW:         1.10 cm  LV E/e' medial:  8.9 LV IVS:        0.80 cm  LV e' lateral:   10.20 cm/s LVOT diam:     2.00 cm  LV E/e' lateral: 8.0 LVOT Area:     3.14 cm  IVC IVC diam: 1.30 cm LEFT ATRIUM         Index LA diam:    2.90 cm 1.63 cm/m   AORTA Ao Root diam: 3.40 cm Ao Asc diam:  3.10 cm MITRAL VALVE MV Area (PHT): 3.30 cm    SHUNTS MV Decel Time: 230 msec    Systemic Diam: 2.00 cm MV E velocity: 81.50 cm/s MV A velocity: 84.80 cm/s MV E/A ratio:  0.96 Olga Millers MD Electronically signed by Olga Millers MD Signature Date/Time: 05/23/2021/11:48:54 AM    Final    [place] EKG SITE RITE  Result Date: 05/23/2021 If Site Rite image not attached, placement could not be confirmed due to current cardiac rhythm.     Subjective: STILL WITH SIGNIFICANT DEFICITS NO ACUTE EVENTS OVERNIGHT  Objective: Vitals:   06/18/21 0447 06/18/21 0500 06/18/21 0758 06/18/21 1131  BP: 123/87  122/84 112/81  Pulse: 89  91 90  Resp: 16  14 14   Temp: 98.4 F (36.9 C)  98.2 F (36.8 C) 97.8 F (36.6 C)  TempSrc: Oral  Oral Oral  SpO2: 100%  98%   Weight:  60.4 kg    Height:        Intake/Output Summary (Last 24 hours) at 06/18/2021 1209 Last data filed at 06/18/2021 0900 Gross per 24 hour  Intake 20 ml  Output 1550 ml  Net -1530 ml   Filed Weights   06/16/21 0335 06/17/21 0500 06/18/21 0500  Weight: 56.1 kg 57 kg 60.4 kg    Examination:    General exam: MIN INTERACTIVE-UNCHANGED FROM PRIOR Respiratory system: Clear to auscultation. Respiratory effort normal. Cardiovascular system: S1 & S2 heard, RRR. No JVD, murmurs, rubs, gallops or clicks. No pedal edema. Gastrointestinal system: Abdomen is  nondistended, soft . Quiet bowel sounds heard. Central nervous system: min interactive reflexes in place Extremities:WWP, NO NEW CONTRACTURES Skin: No rashes, lesions or ulcers Psychiatry: Judgement and insight SEVERELY IMPAIRED, Mood & affect FLAT     Data Reviewed: I have personally reviewed following labs and imaging studies  CBC: Recent Labs  Lab 06/13/21 1009 06/16/21 0340  WBC 6.2 10.2  NEUTROABS 4.1  --   HGB 12.6* 13.4  HCT 37.7* 39.6  MCV 99.7 98.5  PLT 344 275   Basic Metabolic Panel: Recent Labs  Lab 06/13/21 1009 06/15/21 0747 06/17/21 0251  NA 143 144 143  K 3.8 5.0 3.5  CL 108 106 109  CO2 29 30 28   GLUCOSE 102* 111* 124*  BUN 16 16 20   CREATININE 0.58* 0.71 0.56*  CALCIUM 9.3 9.8 9.5   GFR: Estimated Creatinine Clearance: 84.9 mL/min (A) (by C-G formula based on SCr of 0.56 mg/dL (L)). Liver Function Tests: No results for input(s): AST, ALT, ALKPHOS, BILITOT, PROT, ALBUMIN in the last 168 hours. No results for input(s): LIPASE, AMYLASE in the last 168 hours. No results  for input(s): AMMONIA in the last 168 hours. Coagulation Profile: No results for input(s): INR, PROTIME in the last 168 hours. Cardiac Enzymes: No results for input(s): CKTOTAL, CKMB, CKMBINDEX, TROPONINI in the last 168 hours. BNP (last 3 results) No results for input(s): PROBNP in the last 8760 hours. HbA1C: No results for input(s): HGBA1C in the last 72 hours. CBG: Recent Labs  Lab 06/17/21 1950 06/17/21 2345 06/18/21 0431 06/18/21 0754 06/18/21 1127  GLUCAP 127* 113* 143* 136* 125*   Lipid Profile: No results for input(s): CHOL, HDL, LDLCALC, TRIG, CHOLHDL, LDLDIRECT in the last 72 hours. Thyroid Function Tests: No results for input(s): TSH, T4TOTAL, FREET4, T3FREE, THYROIDAB in the last 72 hours. Anemia Panel: No results for input(s): VITAMINB12, FOLATE, FERRITIN, TIBC, IRON, RETICCTPCT in the last 72 hours. Sepsis Labs: No results for input(s): PROCALCITON,  LATICACIDVEN in the last 168 hours.  No results found for this or any previous visit (from the past 240 hour(s)).       Radiology Studies: No results found.      Scheduled Meds:  chlorhexidine  15 mL Mouth Rinse BID   Chlorhexidine Gluconate Cloth  6 each Topical Daily   feeding supplement (PROSource TF)  45 mL Per Tube BID   fiber  1 packet Per Tube BID   free water  100 mL Per Tube Q4H   heparin injection (subcutaneous)  5,000 Units Subcutaneous Q8H   mouth rinse  15 mL Mouth Rinse q12n4p   sodium chloride  1 g Per Tube BID WC   Continuous Infusions:  feeding supplement (JEVITY 1.2 CAL) 1,560 mL (06/17/21 1706)     LOS: 30 days    Time spent: 64    Burke Keels, MD [facility]  If 7PM-7AM, please contact night-coverage  06/18/2021, 12:09 PM

## 2021-06-18 NOTE — TOC Progression Note (Addendum)
Transition of Care [facility]) - Progression Note    Patient Details  Name: [name] [name] MRN: [id number] Date of Birth: 1961/05/17  [facility] CM/SW Contact  Janae Bridgeman, RN Phone Number: 06/18/2021, 1:54 PM  Clinical Narrative:    Case management and MSW with DTP Team is following the patient for transitions of care.  No bed offers are noted at this time and multiple SNF facilities have declined bed offers to this patient for admission.  CM send clinicals to other SNF facilities including:   [facility] - No admissions at this time due to staffing [facility] - No [facility] - No [facility] - No [facility] [place] - No [facility] [facility] - No - (spoke with Revonda Standard, CM on phone) [facility] [place] - placed clinicals in hub [facility] - placed clinicals in hub [facility] [place] [phone] - No answer on phone [facility] [place] - placed clinicals in hub [facility] [phone] - No answer on phone [facility]   - sent clinicals in the hub.  CM and MSW will continue to follow the patient for [facility] admission.     Expected Discharge Plan: Skilled Nursing Facility Barriers to Discharge: Continued Medical Work up  Expected Discharge Plan and Services Expected Discharge Plan: Skilled Nursing Facility In-house Referral: Clinical Social Work   Post Acute Care Choice: IP Rehab, Skilled Nursing Facility Living arrangements for the past 2 months: Apartment                                       Social Determinants of Health (SDOH) Interventions    Readmission Risk Interventions No flowsheet data found.

## 2021-06-19 DIAGNOSIS — R451 Restlessness and agitation: Secondary | ICD-10-CM

## 2021-06-19 LAB — GLUCOSE, CAPILLARY
Glucose-Capillary: 108 mg/dL — ABNORMAL HIGH (ref 70–99)
Glucose-Capillary: 110 mg/dL — ABNORMAL HIGH (ref 70–99)
Glucose-Capillary: 112 mg/dL — ABNORMAL HIGH (ref 70–99)
Glucose-Capillary: 114 mg/dL — ABNORMAL HIGH (ref 70–99)
Glucose-Capillary: 116 mg/dL — ABNORMAL HIGH (ref 70–99)
Glucose-Capillary: 140 mg/dL — ABNORMAL HIGH (ref 70–99)

## 2021-06-19 MED ORDER — HALOPERIDOL LACTATE 5 MG/ML IJ SOLN
3.0000 mg | Freq: Once | INTRAMUSCULAR | Status: AC
  Administered 2021-06-19: 3 mg via INTRAVENOUS
  Filled 2021-06-19: qty 1

## 2021-06-19 MED ORDER — HALOPERIDOL LACTATE 5 MG/ML IJ SOLN: 3.0000 mg | Freq: Once | INTRAMUSCULAR | Status: DC

## 2021-06-19 NOTE — Plan of Care (Signed)
  Problem: Pain Managment: Goal: General experience of comfort will improve Outcome: Progressing   Problem: Safety: Goal: Ability to remain free from injury will improve Outcome: Progressing   Problem: Skin Integrity: Goal: Risk for impaired skin integrity will decrease Outcome: Progressing   

## 2021-06-19 NOTE — Progress Notes (Signed)
[facility] notifed that patient is very anxious making multiple attempts to get of bed informed that because of tube feed fear of aspiration is a concern. New orders received will continue to monitor. Ilean Skill LPN

## 2021-06-19 NOTE — Progress Notes (Signed)
PROGRESS NOTE    [name] [name]  UJW:[id number] DOB: 05-Aug-1961 DOA: 05/19/2021 PCP: Pcp, No   Brief Narrative:  [name] [name] is an 60 y.o. male past medical history past medical history of alcohol abuse and drug abuse who suffered a subarachnoid hemorrhage status post coiling of the anterior communicating aneurysm, underwent VP shunting on 06/09/2019   Significant Events: 5/25 Admitted with [facility] secondary to ruptured aneurysm, ventriculostomy preformed in ED, Ancef started 5/26 Coil Embolization 5/29 worsening neurological exam consistent with vasospasm, hemodynamic augmentation initiated. 6/1 worsening hyponatremia: Na of 120.  Patient started on salt tablets 1 g twice daily and fludrocortisone 0.1 mg. 6/2 BP goal reduced 160 -180.  Salt Tablet increased to 1 g tid. Fludrocortisone increased 0.2 mg. 6/8 Ongoing agitation, intermittently following commands. CT Head with worsening hydrocephalus despite EVD, open to 10 per NSGY. 6/9 EVD remains in place, repeat head CT as below 6/10-12, no significant neuro changes, intermittently agitated.  Ongoing EVD, plans for VP shunt next week 6/13 no neuro changes, remains intermittently restless 6/14 went for VP shunt, no neuro changes, afebrile 6/21 status post PEG tube placement nutrition started 24 hours postplacement.   Awaiting skilled nursing facility placement   Assessment & Plan:   Active Problems:   Cerebral salt-wasting   Aneurysmal subarachnoid hemorrhage (HCC)   Vasospasm of cerebral artery   Diarrheal stools   Acute metabolic encephalopathy subarachnoid hemorrhage secondary to aneurysm rupture: Pt with some confusion overnight, required prn haldol, stable on my eval this AM Obstructive hydrocephalus status post EVD on 06/08/2021. Not able to tolerate his orals. PEG tube placement on 06/15/2021. Continue aspiration precaution and seizure precautions.  He is currently on Keppra for seizure prophylaxis. Physical therapy  evaluated the patient, recommended skilled nursing facility.   Hyponatremia suspect cerebral salt wasting syndrome: Resolved discontinued salt tablets.  Diarrhea: Continue loperamide.  Resolved  Physical deconditioning in the setting of subarachnoid hemorrhage: Will need to go to skilled nursing facility.   Nutrition: Continue nutrition per core track. IR placed PEG on 06/16/2021. BMP 6/25  Leukocytosis: Resolved, Likely stress margination has remained afebrile,  No indication for antibiotics.  DVT prophylaxis: SCD/Compression stockings  Code Status: FULL    Code Status Orders  (From admission, onward)           Start     Ordered   05/19/21 2022  Full code  Continuous        05/19/21 2022           Code Status History     Date Active Date Inactive Code Status Order ID Comments User Context   10/31/2016 2152 11/01/2016 1945 Full Code [id number]  Clifton Custard Inpatient   10/31/2016 1308 10/31/2016 2047 Full Code [id number]  Dionne Ano ED   06/13/2013 1417 06/13/2013 2022 Full Code [id number]  Emilia Beck, PA-C ED      Family Communication: WILL TRY MOTHER AGAIN TOMORROW Disposition Plan:   SNF Consults called: None Admission status: Inpatient Status is: Inpatient   Remains inpatient appropriate because:Hemodynamically unstable   Dispo: The patient is from: Home              Anticipated d/c is to: Skilled nursing facility              Patient currently is not medically stable to d/c.              Difficult to place patient No  Consultants:  NSU, IR  Procedures:  CT ABDOMEN WO CONTRAST  Result Date: 06/14/2021 CLINICAL DATA:  Evaluate anatomy for percutaneous gastrostomy tube placement. History of intracranial aneurysm and hemorrhage. EXAM: CT ABDOMEN WITHOUT CONTRAST TECHNIQUE: Multidetector CT imaging of the abdomen was performed following the standard protocol without IV contrast. COMPARISON:  None. FINDINGS: Lower chest: Few  dependent densities at the right lung base. Otherwise, the lung bases are clear. There appears to be a small hiatal hernia. Hepatobiliary: Normal appearance of the liver and gallbladder. No significant biliary dilatation. Pancreas: Unremarkable. No pancreatic ductal dilatation or surrounding inflammatory changes. Spleen: Normal in size without focal abnormality. Adrenals/Urinary Tract: Normal appearance of the adrenal glands. Normal appearance of both kidneys without stones or hydronephrosis. Stomach/Bowel: Nasogastric tube extends in the stomach and terminates near the duodenal bulb. The stomach is cephalad to the transverse colon. No significant bowel dilatation. No focal bowel inflammation. Vascular/Lymphatic: Atherosclerotic calcifications in the abdominal aorta without aneurysm. No significant lymph node enlargement in the abdomen. Other: VP shunt enters through the anterior right upper abdomen. There is a small amount of gas around the shunt tubing as it enters the abdominal cavity. There is also free air within the anterior upper abdomen likely secondary to the shunt placement. The shunt tubing coils around the right side of the abdomen and terminates in the mid upper abdomen adjacent to the mid transverse colon. No significant free fluid in the abdomen. Areas of subcutaneous edema in the anterior abdomen could be related to injection sites. Musculoskeletal: Sclerosis involving the anterior and mid L2 vertebral body is likely related to degenerative changes. No suspicious bone findings. IMPRESSION: 1. Stomach is located in the anterior upper abdomen and anatomy should be amendable for percutaneous gastrostomy tube placement. 2. Pneumoperitoneum which is likely secondary to recent placement of VP shunt. 3. Probable small hiatal hernia. Electronically Signed   By: Richarda Overlie M.D.   On: 06/14/2021 08:09   CT ANGIO HEAD W OR WO CONTRAST  Result Date: 05/23/2021 CLINICAL DATA:  Neuro deficit, acute stroke  suspected. Aneurysmal subarachnoid hemorrhage with suspected symptomatic vasospasm. EXAM: CT ANGIOGRAPHY HEAD TECHNIQUE: Multidetector CT imaging of the head was performed using the standard protocol during bolus administration of intravenous contrast. Multiplanar CT image reconstructions and MIPs were obtained to evaluate the vascular anatomy. CONTRAST:  51mL OMNIPAQUE IOHEXOL 350 MG/ML SOLN COMPARISON:  CT head May 20, 2021.  CTA May 19, 2021. FINDINGS: CT HEAD Brain: A right frontal ventriculostomy catheter with tip in similar position. Tube there is similar volume of intraventricular hemorrhage i with similar ventriculomegaly. Similar intraparenchymal hemorrhage in the left greater than right inferior frontal lobes, likely related to anterior communicating artery rupture. There is similar surrounding edema and subarachnoid hemorrhage. Additional areas of convexity subarachnoid hemorrhage have slightly decreased. No evidence of interval acute large vascular territory infarct. No progressive mass effect. . Vascular: See below. Skull: No acute fracture. Sinuses: Visualized sinuses are clear. Other: No mastoid effusions. CTA HEAD Anterior circulation: Bilateral ICAs are patent. Similar mild calcific atherosclerosis of the paraclinoid ICAs. Status post anterior communicating artery aneurysm coiling. Streak artifact largely obscures this region, including adjacent distal A1 and proximal A2 ACAs and the paraclinoid ICAs. The visualized MCAs and ACAs are patent. There is new fairly diffuse multifocal moderate irregularity and narrowing of the MCAs and ACAs. Posterior circulation: The intradural vertebral arteries are patent with mild narrowing. Patent basilar artery and bilateral posterior cerebral arteries. Venous sinuses: As permitted by contrast timing, patent. Review of the MIP images confirms the above  findings. IMPRESSION: CT head: 1. Similar intraparenchymal and intraventricular hemorrhage in this patient with  suspected prior anterior communicating artery rupture. No progressive ventriculomegaly or mass effect. Slightly improved convexity subarachnoid hemorrhage. 2. No evidence of acute interval large vascular territory infarct. MRI could provide more sensitive evaluation for acute infarct if clinically indicated. CTA 1. Multifocal moderate irregularity and narrowing of patent MCAs and ACAs bilaterally that is best appreciated on MIP imaging and compatible with vasospasm. Catheter arteriogram could further characterize if clinically indicated. 2. Status post anterior communicating artery aneurysm coiling with streak artifact obscuring this area. Electronically Signed   By: Feliberto Harts MD   On: 05/23/2021 18:13   CT HEAD WO CONTRAST  Addendum Date: 06/02/2021   ADDENDUM REPORT: 06/02/2021 11:46 ADDENDUM: Findings and recommendations discussed with Dr. Merrily Pew via telephone at 11:42 a.m. Electronically Signed   By: Feliberto Harts MD   On: 06/02/2021 11:46   Result Date: 06/02/2021 CLINICAL DATA:  Hydrocephalus. EXAM: CT HEAD WITHOUT CONTRAST TECHNIQUE: Contiguous axial images were obtained from the base of the skull through the vertex without intravenous contrast. COMPARISON:  CT May 23, 2020. FINDINGS: Brain: Significantly increased ventriculomegaly, compatible with worsening hydrocephalus. Right frontal approach ventriculostomy catheter with the tip at the right lateral ventricle near the foramen of Monro, similar to prior. Visualized portions of the shunt catheter appear intact. Small (7 mm) focus of hyperdense hemorrhage in the left pericallosal frontal lobe (series 3, image 19 and series 6, image 38) is concerning for a new/interval acute hemorrhage given no definite hyperdense hemorrhage at this site on the prior, although a direct comparison is limited given evolving blood products. Otherwise, decreased size and density of intraparenchymal hemorrhage in the inferior left frontal lobe and corpus callosum.  Hypodensity in these areas likely represents edema and developing encephalomalacia. Small volume of surrounding subarachnoid hemorrhage, decreased. No evidence of interval acute large vascular territory infarct. Decreased layering intraventricular hemorrhage. Significantly increased size of the lateral and third ventricles, compatible with worsening hydrocephalus. There is rounding of the temporal horns and outward convexity of the third ventricle. Vascular: No hyperdense vessel identified. Sequela of left anterior communicating artery aneurysm coiling. Surrounding streak artifact limits evaluation in this region. Skull: Right frontal burr hole.  No acute fracture. Sinuses/Orbits: Visualized sinuses are clear. Other: No mastoid effusions. IMPRESSION: 1. Significantly increased ventriculomegaly, compatible with worsening hydrocephalus. 2. Small (7 mm) focus of hyperdense hemorrhage in the left pericallosal frontal lobe is concerning for a new/interval site of acute hemorrhage given no definite hyperdense hemorrhage at this site on the prior, although a direct comparison is limited given evolving blood products. Recommend attention on close interval follow-up. 3. Otherwise, improving intraparenchymal, subarachnoid, and intraventricular hemorrhage with edema and suspected developing encephalomalacia in the inferior left frontal lobe and corpus callosum. Electronically Signed: By: Feliberto Harts MD On: 06/02/2021 11:33   CT HEAD WO CONTRAST  Result Date: 05/20/2021 CLINICAL DATA:  Subarachnoid hemorrhage, altered mental status, follow-up examination EXAM: CT HEAD WITHOUT CONTRAST TECHNIQUE: Contiguous axial images were obtained from the base of the skull through the vertex without intravenous contrast. COMPARISON:  CTA and head CT 05/19/2021 FINDINGS: Brain: Interval right frontal ventriculostomy has been placed with its tip in the region of the foramina [place]. Intraparenchymal hematoma within the a  periventricular left frontal lobe anteriorly with extensive hemorrhage layering upon the corpus callosum, and extending into the ventricles bilaterally is again seen and appears stable since prior examination mild subarachnoid hemorrhage within the para falcine sulci  of the vertex, sylvian fissures and within the suprasellar cistern appears stable. Interval coil embolization of an expected anterior communicating artery aneurysm. Stable borderline ventriculomegaly. No acute infarct. No significant mass effect or midline shift. Cerebellum is unremarkable. Vascular: No asymmetric hyperdense vasculature. Skull: No acute fracture. Sinuses/Orbits: Visualized orbits are unremarkable. The paranasal sinuses are clear. Other: Mastoid air cells and middle ear cavities are clear. IMPRESSION: Stable extensive subarachnoid, intraparenchymal, and intraventricular hemorrhage with extensive hemorrhage laying along the corpus callosum. No significant associated mass effect or midline shift. Stable borderline ventriculomegaly. Right frontal ventriculostomy has been placed in the interval with its tip in expected position in the region of the foramina [place]. Interval coil embolization of expected anterior communicating artery aneurysm. Electronically Signed   By: Helyn Numbers MD   On: 05/20/2021 21:36   IR GASTROSTOMY TUBE MOD SED  Result Date: 06/15/2021 INDICATION: Dysphagia second to subarachnoid hemorrhage post coiling of A-comm aneurysm and VP shunt placement. Please perform percutaneous gastrostomy tube placement for enteric nutrition supplementation purposes. EXAM: PULL TROUGH GASTROSTOMY TUBE PLACEMENT COMPARISON:  None. MEDICATIONS: Ancef 2 gm IV; Antibiotics were administered within 1 hour of the procedure. Glucagon 1 mg IV CONTRAST:  15 cc of Omnipaque 300 administered into the gastric lumen. ANESTHESIA/SEDATION: Moderate (conscious) sedation was employed during this procedure. A total of Versed 1 mg and Fentanyl 50  mcg was administered intravenously. Moderate Sedation Time: 33 minutes. The patient's level of consciousness and vital signs were monitored continuously by radiology nursing throughout the procedure under my direct supervision. FLUOROSCOPY TIME:  6 minutes, 48 seconds (46 mGy) COMPLICATIONS: None immediate. PROCEDURE: Informed written consent was obtained from the patient's family following explanation of the procedure, risks, benefits and alternatives. A time out was performed prior to the initiation of the procedure. Ultrasound scanning was performed to demarcate the edge of the left lobe of the liver. Maximal barrier sterile technique utilized including caps, mask, sterile gowns, sterile gloves, large sterile drape, hand hygiene and Betadine prep. The left upper quadrant was sterilely prepped and draped. An oral gastric catheter was inserted into the stomach under fluoroscopy. The existing nasogastric feeding tube was removed. The left costal margin and air opacified transverse colon were identified and avoided. Air was injected into the stomach for insufflation and visualization under fluoroscopy. Under sterile conditions a 17 gauge trocar needle was utilized to access the stomach percutaneously beneath the left subcostal margin after the overlying soft tissues were anesthetized with 1% Lidocaine with epinephrine. Special attention was made to avoid the tip of the ventriculoperitoneal catheter tubing. Needle position was confirmed within the stomach with aspiration of air and injection of small amount of contrast. A single T tack was deployed for gastropexy. Over an Amplatz guide wire, a 9-French sheath was inserted into the stomach. A snare device was utilized to capture the oral gastric catheter. The snare device was pulled retrograde from the stomach up the esophagus and out the oropharynx. The 20-French pull-through gastrostomy was connected to the snare device and pulled antegrade through the oropharynx  down the esophagus into the stomach and then through the percutaneous tract external to the patient. The gastrostomy was assembled externally. Contrast injection confirms appropriate positioning within the stomach. Several spot radiographic images were obtained in various obliquities for documentation. Dressings were applied. The patient tolerated procedure well without immediate post procedural complication. FINDINGS: After successful fluoroscopic guided placement, the gastrostomy tube is appropriately positioned with internal disc positioned against the inner ventral wall of the gastric lumen.  IMPRESSION: Successful fluoroscopic insertion of a 20-French pull-through gastrostomy tube. The gastrostomy may be used immediately for medication administration and in 24 hrs for the initiation of feeds. Electronically Signed   By: Simonne Come M.D.   On: 06/15/2021 16:38   IR Transcath/Emboliz  Result Date: 05/20/2021 PROCEDURE: DIAGNOSTIC CEREBRAL ANGIOGRAM COIL EMBOLIZATION OF ANTERIOR COMMUNICATING ARTERY ANEURYSM HISTORY: The patient is a 60 year old man presenting to the hospital after acute mental status change. His initial workup included CT scan demonstrating significant intraventricular hemorrhage and left-sided subarachnoid hemorrhage. CT angiogram was done demonstrating likelihood of an anterior communicating artery aneurysm. Ventriculostomy was placed and the patient's neurologic status did improve overnight. He therefore presents now for diagnostic cerebral angiogram with possible aneurysm embolization. ACCESS: The technical aspects of the procedure as well as its potential risks and benefits were reviewed with the patient's mother. These risks included but were not limited bleeding, infection, allergic reaction, damage to organs or vital structures, stroke, non-diagnostic procedure, and the catastrophic outcomes of heart attack, coma, and death. With an understanding of these risks, informed consent was  obtained and witnessed. The patient was placed in the supine position on the angiography table and the skin of right groin prepped in the usual sterile fashion. Case was done under general endotracheal anesthesia monitored by the anesthesia service. MEDICATIONS: HEPARIN: 0 Units total. CONTRAST:  cc, Omnipaque 300 FLUOROSCOPY TIME:  FLUOROSCOPY TIME: See IR records TECHNIQUE: CATHETERS AND WIRES 5-French JB-1 catheter 180 cm 0.035" glidewire 6-French NeuronMax guide sheath 6-French Berenstein Select JB-1 catheter 0.058" CatV guidecatheter 150 cm XT 27 microcatheter Synchro 2 select soft microwire Excelsior XT-17 microcatheter COILS USED Target 360 XL 3 mm x 6 cm VESSELS CATHETERIZED Right internal carotid Left internal carotid Left vertebral Right common femoral Right anterior cerebral artery VESSELS STUDIED Right internal carotid, head Left internal carotid, head Left vertebral Right internal carotid artery, head (during embolization) Right internal carotid artery, head (immediate post-embolization) Right internal carotid artery, head (final control) Right common femoral PROCEDURAL NARRATIVE A 5-Fr JB-1 glide catheter was advanced over a 0.035 glidewire into the aortic arch. The above vessels were then sequentially catheterized and cervical / cerebral angiograms taken. After review of images, the catheter was removed without incident. The 5-Fr sheath was then exchanged over the glidewire for an 8-Fr sheath. Under real-time fluoroscopy, the guide sheath was advanced over the select catheter and glidewire into the descending aorta. The select catheter was then advanced into the cervical right internal carotid artery. The guide sheath was then advanced into the proximal cervical right internal carotid artery. The Select catheter was removed and the 058 guide catheter was coaxially introduced over the microcatheter and microwire. The microcatheter was then advanced under roadmap guidance into the cavernous internal  carotid artery. The guide catheter was then tracked into the cavernous segment of the internal carotid artery. The microwire was then used to select the right middle cerebral artery and the 27 microcatheter was advanced into the M1-M2 junction. This allowed tracking of the guide catheter into the supraclinoid internal carotid artery. Fluoro phase run was taken which demonstrated significant contrast stasis within the right carotid distribution suggesting significant catheter induced spasm. I therefore withdrew the guide catheter into the cavernous segment of the internal carotid artery. Another fluoro phase run demonstrated improved flow through the right carotid circulation. At this point the 27 catheter was removed and the 17 catheter was introduced over the same microwire. Initially, the microwire was advanced into the A2 segment which allowed  advancement of the microcatheter into the right A1. Microwire was then withdrawn and the aneurysm lumen was catheterized. The above coil was then deployed. Run taken demonstrated occlusion of the dome of the aneurysm, with a small neck residual. There was significant for contrast stasis within the right ICA territory again suggesting significant likely catheter-related spasm. I therefore elected to complete the procedure at this point, with good dome protection of the aneurysm and minimal neck residual. The microcatheter was removed. Immediate post embolization angiogram was taken. The guide catheter and guide sheath were then withdrawn down into the proximal cervical internal carotid artery. Final control angiogram was taken. The guide catheter and guide sheath were then synchronously removed without incident. FINDINGS: Right internal carotid, head: Injection reveals the presence of a widely patent ICA, M1, and A1 segments and their branches. There is an aneurysm projecting towards the left arising from the right A1 A2 junction. This aneurysm has a relatively narrow neck,  with a dome measuring approximately 3.0 x 2.6 mm. There is significant narrowing of the supraclinoid internal carotid artery and M1 and A1 segments likely reflecting vasospasm. The parenchymal and venous phases are normal. The venous sinuses are widely patent. Left internal carotid, head: Injection reveals the presence of a widely patent ICA, A1, and M1 segments and their branches. There is significant narrowing consistent with vasospasm involving the supraclinoid ICA as well as the first segment its of the anterior and middle cerebral arteries. No left-sided carotid aneurysms are seen. The parenchymal and venous phases are normal. The venous sinuses are widely patent. Left vertebral: Injection reveals the presence of a widely patent vertebral artery. This leads to a widely patent basilar artery that terminates in bilateral P1. The basilar apex is normal. No aneurysms, AVMs, or high-flow fistulas are seen. There is no significant vasospasm of the basilar artery or posterior circulation. The parenchymal and venous phases are normal. The venous sinuses are widely patent. Right internal carotid artery, head (during embolization): Injection reveals the presence of a widely patent ICA that leads to a patent ACA and MCA, although there is significant vasospasm involving the right A1 and M1. Coil mass within the aneurysm is stable, without coil prolapse or filling defect to suggest thrombus. Aneurysm appears to be occluded. Right internal carotid artery, head (immediate post-embolization): Injection reveals the presence of a widely patent ICA that leads to a patent ACA and MCA. Coil mass within the aneurysm is stable, without coil prolapse or filling defect to suggest thrombus. No aneurysm filling is seen. There remains significant vasospasm involving the supraclinoid internal carotid artery as well as the A1 and M1 segments. Right internal carotid artery, head (final control): Injection reveals the presence of a widely  patent ICA that leads to a patent ACA and MCA. No thrombus is visualized. Coil mass is seen within the aneurysm and is in stable position. There is continued spasm involving the supraclinoid internal carotid artery, A1, and M1 segments. Capillary phase however does not demonstrate any perfusion deficits. Venous sinuses are patent. Right femoral: Normal vessel. No significant atherosclerotic disease. Arterial sheath in adequate position. DISPOSITION: Upon completion of the study, the femoral sheath was removed and hemostasis obtained using a 7-Fr ExoSeal closure device. Good proximal and distal lower extremity pulses were documented upon achievement of hemostasis. The procedure was well tolerated and no early complications were observed. The patient was transferred to the postanesthesia care unit in stable hemodynamic condition. IMPRESSION: 1. 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 2. There is moderate vasospasm involving the bilateral supraclinoid internal carotid arteries, and bilateral A1 and M1 segments which does not appear to be flow limiting. The preliminary results of this procedure were shared with the patient's family. 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 His initial workup included CT scan demonstrating significant intraventricular hemorrhage and left-sided subarachnoid hemorrhage. CT angiogram was done demonstrating likelihood of an anterior communicating artery aneurysm. Ventriculostomy was placed and the patient's neurologic status did improve overnight. He therefore presents now for diagnostic cerebral angiogram with possible aneurysm embolization. ACCESS: The technical  aspects of the procedure as well as its potential risks and benefits were reviewed with the patient's mother. These risks included but were not limited bleeding, infection, allergic reaction, damage to organs or vital structures, stroke, non-diagnostic procedure, and the catastrophic outcomes of heart attack, coma, and death. With an understanding of these risks, informed consent was obtained and witnessed. The patient was placed in the supine position on the angiography table and the skin of right groin prepped in the usual sterile fashion. Case was done under general endotracheal anesthesia monitored by the anesthesia service. MEDICATIONS: HEPARIN: 0 Units total. CONTRAST:  cc, Omnipaque 300 FLUOROSCOPY TIME:  FLUOROSCOPY TIME: See IR records TECHNIQUE: CATHETERS AND WIRES 5-French JB-1 catheter 180 cm 0.035" glidewire 6-French NeuronMax guide sheath 6-French Berenstein Select JB-1 catheter 0.058" CatV guidecatheter 150 cm XT 27 microcatheter Synchro 2 select soft microwire Excelsior XT-17 microcatheter COILS USED Target 360 XL 3 mm x 6 cm VESSELS CATHETERIZED Right internal carotid Left internal carotid Left vertebral Right common femoral Right anterior cerebral artery VESSELS STUDIED Right internal carotid, head Left internal carotid, head Left vertebral Right internal carotid artery, head (during embolization) Right internal carotid artery, head (immediate post-embolization) Right internal carotid artery, head (final control) Right common femoral PROCEDURAL NARRATIVE A 5-Fr JB-1 glide catheter was advanced over a 0.035 glidewire into the aortic arch. The above vessels were then sequentially catheterized and cervical / cerebral angiograms taken. After review of images, the catheter was removed without incident. The 5-Fr sheath was then exchanged over the glidewire for an 8-Fr sheath. Under real-time fluoroscopy, the guide sheath was advanced over the select catheter and glidewire into the descending aorta. The  select catheter was then advanced into the cervical right internal carotid artery. The guide sheath was then advanced into the proximal cervical right internal carotid artery. The Select catheter was removed and the 058 guide catheter was coaxially introduced over the microcatheter and microwire. The microcatheter was then advanced under roadmap guidance into the cavernous internal carotid artery. The guide catheter was then tracked into the cavernous segment of the internal carotid artery. The microwire was then used to select the right middle cerebral artery and the 27 microcatheter was advanced into the M1-M2 junction. This allowed tracking of the guide catheter into the supraclinoid internal carotid artery. Fluoro phase run was taken which demonstrated significant contrast stasis within the right carotid distribution suggesting significant catheter induced spasm. I therefore withdrew the guide catheter into the cavernous segment of the internal carotid artery. Another fluoro phase run demonstrated improved flow through the right carotid circulation. At this point the 27 catheter was removed and the 17 catheter was introduced over  the same microwire. Initially, the microwire was advanced into the A2 segment which allowed advancement of the microcatheter into the right A1. Microwire was then withdrawn and the aneurysm lumen was catheterized. The above coil was then deployed. Run taken demonstrated occlusion of the dome of the aneurysm, with a small neck residual. There was significant for contrast stasis within the right ICA territory again suggesting significant likely catheter-related spasm. I therefore elected to complete the procedure at this point, with good dome protection of the aneurysm and minimal neck residual. The microcatheter was removed. Immediate post embolization angiogram was taken. The guide catheter and guide sheath were then withdrawn down into the proximal cervical internal carotid artery.  Final control angiogram was taken. The guide catheter and guide sheath were then synchronously removed without incident. FINDINGS: Right internal carotid, head: Injection reveals the presence of a widely patent ICA, M1, and A1 segments and their branches. There is an aneurysm projecting towards the left arising from the right A1 A2 junction. This aneurysm has a relatively narrow neck, with a dome measuring approximately 3.0 x 2.6 mm. There is significant narrowing of the supraclinoid internal carotid artery and M1 and A1 segments likely reflecting vasospasm. The parenchymal and venous phases are normal. The venous sinuses are widely patent. Left internal carotid, head: Injection reveals the presence of a widely patent ICA, A1, and M1 segments and their branches. There is significant narrowing consistent with vasospasm involving the supraclinoid ICA as well as the first segment its of the anterior and middle cerebral arteries. No left-sided carotid aneurysms are seen. The parenchymal and venous phases are normal. The venous sinuses are widely patent. Left vertebral: Injection reveals the presence of a widely patent vertebral artery. This leads to a widely patent basilar artery that terminates in bilateral P1. The basilar apex is normal. No aneurysms, AVMs, or high-flow fistulas are seen. There is no significant vasospasm of the basilar artery or posterior circulation. The parenchymal and venous phases are normal. The venous sinuses are widely patent. Right internal carotid artery, head (during embolization): Injection reveals the presence of a widely patent ICA that leads to a patent ACA and MCA, although there is significant vasospasm involving the right A1 and M1. Coil mass within the aneurysm is stable, without coil prolapse or filling defect to suggest thrombus. Aneurysm appears to be occluded. Right internal carotid artery, head (immediate post-embolization): Injection reveals the presence of a widely patent ICA  that leads to a patent ACA and MCA. Coil mass within the aneurysm is stable, without coil prolapse or filling defect to suggest thrombus. No aneurysm filling is seen. There remains significant vasospasm involving the supraclinoid internal carotid artery as well as the A1 and M1 segments. Right internal carotid artery, head (final control): Injection reveals the presence of a widely patent ICA that leads to a patent ACA and MCA. No thrombus is visualized. Coil mass is seen within the aneurysm and is in stable position. There is continued spasm involving the supraclinoid internal carotid artery, A1, and M1 segments. Capillary phase however does not demonstrate any perfusion deficits. Venous sinuses are patent. Right femoral: Normal vessel. No significant atherosclerotic disease. Arterial sheath in adequate position. DISPOSITION: Upon completion of the study, the femoral sheath was removed and hemostasis obtained using a 7-Fr ExoSeal closure device. Good proximal and distal lower extremity pulses were documented upon achievement of hemostasis. The procedure was well tolerated and no early complications were observed. The patient was transferred to the postanesthesia care unit in  stable hemodynamic condition. IMPRESSION: 1. 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 2. There is moderate vasospasm involving the bilateral supraclinoid internal carotid arteries, and bilateral A1 and M1 segments which does not appear to be flow limiting. The preliminary results of this procedure were shared with the patient's family. Electronically Signed   By: Lisbeth Renshaw   On: 05/20/2021 16:23   DG CHEST PORT 1 VIEW  Result Date: 05/23/2021 CLINICAL DATA:  Subarachnoid hemorrhage EXAM: PORTABLE CHEST 1 VIEW COMPARISON:  May 19, 2021 FINDINGS: The cardiomediastinal silhouette is unchanged in contour.Enteric tube tip projects over the proximal duodenum. No pleural effusion. No pneumothorax. LEFT  basilar linear opacity. Visualized abdomen is unremarkable. Multilevel degenerative changes of the thoracic spine. IMPRESSION: LEFT basilar linear opacity with differential considerations including infection or atelectasis. Electronically Signed   By: Meda Klinefelter MD   On: 05/23/2021 11:38   DG Abd Portable 1V  Result Date: 05/21/2021 CLINICAL DATA:  Feeding tube placement. EXAM: PORTABLE ABDOMEN - 1 VIEW COMPARISON:  None. FINDINGS: Bowel gas pattern is normal. Small bore feeding tube terminates in the distal stomach. Lung bases are clear. IMPRESSION: Small bore feeding tube terminates in the distal stomach. Electronically Signed   By: Marin Roberts M.D.   On: 05/21/2021 12:22   ECHOCARDIOGRAM COMPLETE  Result Date: 05/26/2021    ECHOCARDIOGRAM REPORT   Patient Name:   [name] [name] Date of Exam: 05/26/2021 Medical Rec #:  [id number]        Height:       69.0 in Accession #:    [id number]       Weight:       136.5 lb Date of Birth:  Feb 18, 1961        BSA:          1.756 m Patient Age:    60 years         BP:           184/87 mmHg Patient Gender: M                HR:           91 bpm. Exam Location:  Inpatient Procedure: 2D Echo, 3D Echo, Color Doppler and Cardiac Doppler Indications:    I42.9 Cardiomyopathy (unspecified)  History:        Patient has prior history of Echocardiogram examinations, most                 recent 05/23/2021.  Sonographer:    Irving Burton Senior RDCS Referring Phys: [id number] RAVI AGARWALA IMPRESSIONS  1. Left ventricular ejection fraction by 3D volume is 63 %. The left ventricle has normal function. The left ventricle has no regional wall motion abnormalities. Left ventricular diastolic parameters are consistent with Grade I diastolic dysfunction (impaired relaxation).  2. Right ventricular systolic function is normal. The right ventricular size is normal. Tricuspid regurgitation signal is inadequate for assessing PA pressure.  3. The mitral valve is normal in structure. Trivial  mitral valve regurgitation. No evidence of mitral stenosis.  4. The aortic valve is tricuspid. Aortic valve regurgitation is not visualized. No aortic stenosis is present.  5. The inferior vena cava is normal in size with greater than 50% respiratory variability, suggesting right atrial pressure of 3 mmHg. FINDINGS  Left Ventricle: Left ventricular ejection fraction by 3D volume is 63 %. The left ventricle has normal function. The left ventricle has no regional wall motion abnormalities. The left ventricular internal  cavity size was normal in size. There is no left  ventricular hypertrophy. Left ventricular diastolic parameters are consistent with Grade I diastolic dysfunction (impaired relaxation). Right Ventricle: The right ventricular size is normal. No increase in right ventricular wall thickness. Right ventricular systolic function is normal. Tricuspid regurgitation signal is inadequate for assessing PA pressure. Left Atrium: Left atrial size was normal in size. Right Atrium: Right atrial size was normal in size. Pericardium: There is no evidence of pericardial effusion. Mitral Valve: The mitral valve is normal in structure. Trivial mitral valve regurgitation. No evidence of mitral valve stenosis. Tricuspid Valve: The tricuspid valve is normal in structure. Tricuspid valve regurgitation is not demonstrated. No evidence of tricuspid stenosis. Aortic Valve: The aortic valve is tricuspid. Aortic valve regurgitation is not visualized. No aortic stenosis is present. Pulmonic Valve: The pulmonic valve was normal in structure. Pulmonic valve regurgitation is trivial. No evidence of pulmonic stenosis. Aorta: The aortic root is normal in size and structure. Venous: The inferior vena cava is normal in size with greater than 50% respiratory variability, suggesting right atrial pressure of 3 mmHg. IAS/Shunts: No atrial level shunt detected by color flow Doppler.  LEFT VENTRICLE PLAX 2D LVIDd:         4.30 cm          Diastology LVIDs:         2.90 cm         LV e' medial:    7.62 cm/s LV PW:         0.90 cm         LV E/e' medial:  9.3 LV IVS:        0.60 cm         LV e' lateral:   10.00 cm/s LVOT diam:     2.00 cm         LV E/e' lateral: 7.1 LV SV:         70 LV SV Index:   40 LVOT Area:     3.14 cm        3D Volume EF                                LV 3D EF:    Left                                             ventricular                                             ejection                                             fraction by                                             3D volume  is 63 %.                                 3D Volume EF:                                3D EF:        63 %                                LV EDV:       124 ml                                LV ESV:       46 ml                                LV SV:        77 ml RIGHT VENTRICLE RV S prime:     7.72 cm/s TAPSE (M-mode): 1.8 cm LEFT ATRIUM             Index       RIGHT ATRIUM           Index LA diam:        3.90 cm 2.22 cm/m  RA Area:     11.20 cm LA Vol (A2C):   38.0 ml 21.64 ml/m RA Volume:   22.40 ml  12.75 ml/m LA Vol (A4C):   40.2 ml 22.89 ml/m LA Biplane Vol: 39.7 ml 22.61 ml/m  AORTIC VALVE LVOT Vmax:   140.00 cm/s LVOT Vmean:  95.000 cm/s LVOT VTI:    0.223 m  AORTA Ao Root diam: 3.30 cm Ao Asc diam:  3.10 cm MITRAL VALVE MV Area (PHT): 3.21 cm    SHUNTS MV Decel Time: 236 msec    Systemic VTI:  0.22 m MV E velocity: 71.00 cm/s  Systemic Diam: 2.00 cm MV A velocity: 94.00 cm/s MV E/A ratio:  0.76 Weston BrassGayatri Acharya MD Electronically signed by Weston BrassGayatri Acharya MD Signature Date/Time: 05/26/2021/9:20:19 PM    Final    VAS US TRANSCRANIAL DOPPLER  Result Date: 06/04/2021  Transcranial Doppler Patient Name:  [name] [name]  Date of Exam:   06/02/2021 Medical Rec #: [id number]         Accession #:    [id number] Date of Birth: 10-12-1961         Patient Gender: M Patient Age:   60Y [facility]  [place] Procedure:      VAS US TRANSCRANIAL DOPPLER Referring Phys: [id number] Donalee CitrinGARY CRAM --------------------------------------------------------------------------------  Indications: Subarachnoid hemorrhage. Limitations: Patient unable to remain stationary for duration of examination. Comparison Study: Most recent prior study 05-31-2021 Performing Technologist: Jean Rosenthalachel Hodge RDMS,RVT  Examination Guidelines: A complete evaluation includes B-mode imaging, spectral Doppler, color Doppler, and power Doppler as needed of all accessible portions of each vessel. Bilateral testing is considered an integral part of a complete examination. Limited examinations for reoccurring indications may be performed as noted.  +----------+-------------+----------+-----------+-------+ RIGHT TCD Right VM (cm)Depth (cm)PulsatilityComment +----------+-------------+----------+-----------+-------+ MCA          119.00       5.50      0.72            +----------+-------------+----------+-----------+-------+  ACA          -31.00                 0.68            +----------+-------------+----------+-----------+-------+ Term ICA     111.00       5.60      0.67            +----------+-------------+----------+-----------+-------+ PCA           38.00                 0.67            +----------+-------------+----------+-----------+-------+ Opthalmic     29.00                 1.24            +----------+-------------+----------+-----------+-------+ ICA siphon    29.00                 1.17            +----------+-------------+----------+-----------+-------+ Vertebral    -20.00                 0.79            +----------+-------------+----------+-----------+-------+ Distal ICA    23.00                 0.82            +----------+-------------+----------+-----------+-------+  +----------+------------+----------+-----------+-------+ LEFT TCD  Left VM (cm)Depth (cm)PulsatilityComment  +----------+------------+----------+-----------+-------+ MCA          130.00      4.10      0.86            +----------+------------+----------+-----------+-------+ ACA          -26.00                0.32            +----------+------------+----------+-----------+-------+ Term ICA     36.00       5.70      0.79            +----------+------------+----------+-----------+-------+ PCA          25.00                 1.16            +----------+------------+----------+-----------+-------+ Opthalmic    27.00                 1.01            +----------+------------+----------+-----------+-------+ ICA siphon   34.00                 1.29            +----------+------------+----------+-----------+-------+ Vertebral    -22.00                0.61            +----------+------------+----------+-----------+-------+ Distal ICA   20.00                 0.86            +----------+------------+----------+-----------+-------+  +------------+-------+------------------+             VM cm/s     Comment       +------------+-------+------------------+ Prox Basilar       Unable to insonate +------------+-------+------------------+ +----------------------+----+ Right Lindegaard Ratio5.17 +----------------------+----+ +---------------------+----+ Left Lindegaard Ratio6.50 +---------------------+----+  Summary:  Elevated mean flow  velocities in bilateral middle cerebral and terminal right internal carotid arteries suggestive of mild vasospasm. *See table(s) above for TCD measurements and observations.  Diagnosing physician: Delia Heady MD Electronically signed by Delia Heady MD on 06/04/2021 at 12:01:08 PM.    Final    VAS [place] TRANSCRANIAL DOPPLER  Result Date: 05/31/2021  Transcranial Doppler Patient Name:  [name] [name]  Date of Exam:   05/31/2021 Medical Rec #: [id number]         Accession #:    [id number] Date of Birth: [birth year]         Patient Gender: M Patient Age:   60Y  Exam Location:  [facility] Procedure:      VAS [place] TRANSCRANIAL DOPPLER Referring Phys: [id number] Donalee Citrin --------------------------------------------------------------------------------  Indications: Subarachnoid hemorrhage. Comparison Study: 05/28/21- TCD Performing Technologist: Gertie Fey MHA, RDMS, RVT, RDCS  Examination Guidelines: A complete evaluation includes B-mode imaging, spectral Doppler, color Doppler, and power Doppler as needed of all accessible portions of each vessel. Bilateral testing is considered an integral part of a complete examination. Limited examinations for reoccurring indications may be performed as noted.  +----------+-------------+----------+-----------+------------------+ RIGHT TCD Right VM (cm)Depth (cm)Pulsatility     Comment       +----------+-------------+----------+-----------+------------------+ MCA           92.00       4.70      0.96                       +----------+-------------+----------+-----------+------------------+ ACA          -26.00                 0.75                       +----------+-------------+----------+-----------+------------------+ Term ICA      21.00                 1.26                       +----------+-------------+----------+-----------+------------------+ PCA           18.00                 1.21                       +----------+-------------+----------+-----------+------------------+ Opthalmic     20.00                 1.24                       +----------+-------------+----------+-----------+------------------+ ICA siphon                                  Unable to insonate +----------+-------------+----------+-----------+------------------+ Vertebral                                   Unable to insonate +----------+-------------+----------+-----------+------------------+ Distal ICA    36.00                                             +----------+-------------+----------+-----------+------------------+  +----------+------------+----------+-----------+------------------+ LEFT TCD  Left VM (cm)Depth (cm)Pulsatility  Comment       +----------+------------+----------+-----------+------------------+ MCA          93.00                 0.99                       +----------+------------+----------+-----------+------------------+ ACA                                        Unable to insonate +----------+------------+----------+-----------+------------------+ Term ICA     46.00                 0.84                       +----------+------------+----------+-----------+------------------+ PCA                                        Unable to insonate +----------+------------+----------+-----------+------------------+ Opthalmic    24.00                 1.24                       +----------+------------+----------+-----------+------------------+ ICA siphon                                 Unable to insonate +----------+------------+----------+-----------+------------------+ Vertebral                                  Unable to insonate +----------+------------+----------+-----------+------------------+ Distal ICA   28.00                                            +----------+------------+----------+-----------+------------------+  +------------+-------+------------------+             VM cm/s     Comment       +------------+-------+------------------+ Prox Basilar       Unable to insonate +------------+-------+------------------+ +----------------------+----+ Right Lindegaard Ratio2.56 +----------------------+----+ +---------------------+----+ Left Lindegaard Ratio3.32 +---------------------+----+  Summary:  Mildly elevared bilateral midle cerebral artery mean flow velocities of unclear significance.Not all vessels could be studied due to technical difficulties. *See table(s) above for TCD  measurements and observations.  Diagnosing physician: Delia Heady MD Electronically signed by Delia Heady MD on 05/31/2021 at 4:39:43 PM.    Final    VAS [place] TRANSCRANIAL DOPPLER  Result Date: 05/28/2021  Transcranial Doppler Patient Name:  [name] [name]  Date of Exam:   05/28/2021 Medical Rec #: [id number]         Accession #:    [id number] Date of Birth: 08/31/1961         Patient Gender: M Patient Age:   60Y Exam Location:  [facility] Procedure:      VAS [place] TRANSCRANIAL DOPPLER Referring Phys: [id number] Donalee Citrin --------------------------------------------------------------------------------  Indications: Subarachnoid hemorrhage. 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 Limitations: Patient movement throughout the exam Limitations for diagnostic windows: Unable to insonate right transtemporal window. Unable to insonate left transtemporal window. Unable to insonate occipital window. Performing Technologist: Marilynne Halsted RDMS,  RVT  Examination Guidelines: A complete evaluation includes B-mode imaging, spectral Doppler, color Doppler, and power Doppler as needed of all accessible portions of each vessel. Bilateral testing is considered an integral part of a complete examination. Limited examinations for reoccurring indications may be performed as noted.  +----------+-------------+----------+-----------+-------------+ RIGHT TCD Right VM (cm)Depth (cm)Pulsatility   Comment    +----------+-------------+----------+-----------+-------------+ MCA          155.00       5.60      0.71                  +----------+-------------+----------+-----------+-------------+ ACA                                         not insonated +----------+-------------+----------+-----------+-------------+ Term ICA                                    not insonated +----------+-------------+----------+-----------+-------------+ PCA           30.00                 0.70                   +----------+-------------+----------+-----------+-------------+ Opthalmic                                   not insonated +----------+-------------+----------+-----------+-------------+ ICA siphon                                  not insonated +----------+-------------+----------+-----------+-------------+ Vertebral                                   not insonated +----------+-------------+----------+-----------+-------------+ Distal ICA                                  not insonated +----------+-------------+----------+-----------+-------------+  +----------+------------+----------+-----------+-------------+ LEFT TCD  Left VM (cm)Depth (cm)Pulsatility   Comment    +----------+------------+----------+-----------+-------------+ MCA          166.00      4.20      0.81                  +----------+------------+----------+-----------+-------------+ ACA                                        not insonated +----------+------------+----------+-----------+-------------+ Term ICA                                   not insonated +----------+------------+----------+-----------+-------------+ PCA          54.00                 0.83                  +----------+------------+----------+-----------+-------------+ Opthalmic  not insonated +----------+------------+----------+-----------+-------------+ ICA siphon                                 not insonated +----------+------------+----------+-----------+-------------+ Vertebral                                  not insonated +----------+------------+----------+-----------+-------------+ Distal ICA                                 not insonated +----------+------------+----------+-----------+-------------+  +------------+-------+-------------+             VM cm/s   Comment    +------------+-------+-------------+ Prox Basilar       not insonated  +------------+-------+-------------+ Dist Basilar       not insonated +------------+-------+-------------+ Summary:  patient movement limited today's study. only b/l MCA and PCA were able to recorded. with this limited data, still has elevated b/l MCA MVFs, not significantly changed from prior, still suggesting moderate vasospasm b/l MCAs. clinical correlation recommended. *See table(s) above for TCD measurements and observations.  Diagnosing physician: Marvel Plan MD Electronically signed by Marvel Plan MD on 05/28/2021 at 2:46:59 PM.    Final    VAS [place] TRANSCRANIAL DOPPLER  Result Date: 05/28/2021  Transcranial Doppler Patient Name:  [name] [name]  Date of Exam:   05/26/2021 Medical Rec #: [id number]         Accession #:    [id number] Date of Birth: 1961/04/24         Patient Gender: M Patient Age:   60Y Exam Location:  [facility] Procedure:      VAS [place] TRANSCRANIAL DOPPLER Referring Phys: [id number] Donalee Citrin --------------------------------------------------------------------------------  Indications: Subarachnoid hemorrhage. Limitations: Patient position/ lines/ drains Comparison Study: 05/24/21 Performing Technologist: Jeb Levering RDMS, RVT  Examination Guidelines: A complete evaluation includes B-mode imaging, spectral Doppler, color Doppler, and power Doppler as needed of all accessible portions of each vessel. Bilateral testing is considered an integral part of a complete examination. Limited examinations for reoccurring indications may be performed as noted.  +----------+-------------+----------+-----------+--------------+ RIGHT TCD Right VM (cm)Depth (cm)Pulsatility   Comment     +----------+-------------+----------+-----------+--------------+ MCA          128.00                 0.84                   +----------+-------------+----------+-----------+--------------+ ACA          -144.00                0.67                    +----------+-------------+----------+-----------+--------------+ Term ICA      67.00                 0.98                   +----------+-------------+----------+-----------+--------------+ PCA           29.00                 0.96                   +----------+-------------+----------+-----------+--------------+ Opthalmic     22.00  0.95                   +----------+-------------+----------+-----------+--------------+ ICA siphon                                  not visualized +----------+-------------+----------+-----------+--------------+ Vertebral    -52.00                                        +----------+-------------+----------+-----------+--------------+ Distal ICA    38.00                                        +----------+-------------+----------+-----------+--------------+  +----------+------------+----------+-----------+--------------+ LEFT TCD  Left VM (cm)Depth (cm)Pulsatility   Comment     +----------+------------+----------+-----------+--------------+ MCA          156.00                0.76                   +----------+------------+----------+-----------+--------------+ ACA          -55.00                0.76                   +----------+------------+----------+-----------+--------------+ Term ICA     38.00                 0.95                   +----------+------------+----------+-----------+--------------+ PCA          30.00                 0.96                   +----------+------------+----------+-----------+--------------+ Opthalmic    20.00                 1.73                   +----------+------------+----------+-----------+--------------+ ICA siphon                                 not visualized +----------+------------+----------+-----------+--------------+ Vertebral    -53.00                                       +----------+------------+----------+-----------+--------------+ Distal ICA    33.00                                        +----------+------------+----------+-----------+--------------+  +------------+-------+-------+             VM cm/sComment +------------+-------+-------+ Prox Basilar-58.00         +------------+-------+-------+ Dist Basilar-47.00         +------------+-------+-------+ +----------------------+----+ Right Lindegaard Ratio3.37 +----------------------+----+ +---------------------+----+ Left Lindegaard Ratio4.73 +---------------------+----+  Summary:  elevated b/l MCA MFVs as well as b/l Lindegaard ratios no significant or slightly improved from prior, still suggesting mild to moderate vasospasm more on the left. right ACA MFV also elevated  this time which was not present on prior study. However, left  ACA elevated MFV seems resolved. continue monitoring warranted. clinical correlation recommended. *See table(s) above for TCD measurements and observations.  Diagnosing physician: Marvel Plan MD Electronically signed by Marvel Plan MD on 05/28/2021 at 2:45:06 PM.    Final    VAS [place] TRANSCRANIAL DOPPLER  Result Date: 05/28/2021  Transcranial Doppler Patient Name:  [name] [name]  Date of Exam:   05/24/2021 Medical Rec #: [id number]         Accession #:    [id number] Date of Birth: 12-Oct-1961         Patient Gender: M Patient Age:   60Y Exam Location:  [facility] Procedure:      VAS [place] TRANSCRANIAL DOPPLER Referring Phys: [id number] Donalee Citrin --------------------------------------------------------------------------------  Indications: Subarachnoid hemorrhage. Limitations: Patient unable to cooperate; combative. Limitations for diagnostic windows: Unable to insonate occipital window. Comparison Study: 05/21/21- TCD Performing Technologist: Gertie Fey MHA, RDMS, RVT, RDCS  Examination Guidelines: A complete evaluation includes B-mode imaging, spectral Doppler, color Doppler, and power Doppler as needed of all accessible portions of each vessel.  Bilateral testing is considered an integral part of a complete examination. Limited examinations for reoccurring indications may be performed as noted.  +----------+-------------+----------+-----------+------------------+ RIGHT TCD Right VM (cm)Depth (cm)Pulsatility     Comment       +----------+-------------+----------+-----------+------------------+ MCA          135.00       5.40      0.94                       +----------+-------------+----------+-----------+------------------+ ACA          -38.00                 1.13                       +----------+-------------+----------+-----------+------------------+ Term ICA      21.00                 1.20                       +----------+-------------+----------+-----------+------------------+ PCA           46.00                 0.90                       +----------+-------------+----------+-----------+------------------+ Opthalmic     17.00                 1.27                       +----------+-------------+----------+-----------+------------------+ ICA siphon    21.00                  1.2                       +----------+-------------+----------+-----------+------------------+ Vertebral                                   Unable to insonate +----------+-------------+----------+-----------+------------------+ Distal ICA    29.00                                            +----------+-------------+----------+-----------+------------------+  +----------+------------+----------+-----------+------------------+  LEFT TCD  Left VM (cm)Depth (cm)Pulsatility     Comment       +----------+------------+----------+-----------+------------------+ MCA          142.00      5.40      0.79                       +----------+------------+----------+-----------+------------------+ ACA         -148.00                0.53                       +----------+------------+----------+-----------+------------------+ Term  ICA     99.00                 0.84                       +----------+------------+----------+-----------+------------------+ PCA          20.00                 1.00                       +----------+------------+----------+-----------+------------------+ Opthalmic    25.00                 1.37                       +----------+------------+----------+-----------+------------------+ ICA siphon   47.00                 1.03                       +----------+------------+----------+-----------+------------------+ Vertebral                                  Unable to insonate +----------+------------+----------+-----------+------------------+ Distal ICA   32.00                                            +----------+------------+----------+-----------+------------------+  +------------+-------+------------------+             VM cm/s     Comment       +------------+-------+------------------+ Prox Basilar       Unable to insonate +------------+-------+------------------+ Dist Basilar       Unable to insonate +------------+-------+------------------+ +----------------------+----+ Right Lindegaard Ratio4.66 +----------------------+----+ +---------------------+----+ Left Lindegaard Ratio4.44 +---------------------+----+  Summary:  elevated MFVs at b/l MCAs, left ACA and terminal ICA suggesting b/l mild to moderate vasospasm. however, the b/l MCA MFVs seem improved from 3 days ago, as well as the b/l Lindegaard ratios. Yet the MFV of left ACA was significant elevated from 3 days ago, further close monitoring warranted. clinical correlation recommended. *See table(s) above for TCD measurements and observations.  Diagnosing physician: Marvel Plan MD Electronically signed by Marvel Plan MD on 05/28/2021 at 2:41:32 PM.    Final    VAS [place] TRANSCRANIAL DOPPLER  Result Date: 05/28/2021  Transcranial Doppler Patient Name:  [name] [name]  Date of Exam:   05/21/2021 Medical Rec #:  [id number]         Accession #:    [id number] Date of Birth: June 18, 1961         Patient Gender: M Patient  Age:   40Y Exam Location:  [facility] Procedure:      VAS [place] TRANSCRANIAL DOPPLER Referring Phys: [id number] GARY CRAM --------------------------------------------------------------------------------  Indications: Subarachnoid hemorrhage. History: History of drug and alcohol abuse. No other significant history. Comparison Study: No prior study Performing Technologist: Gertie Fey MHA, RDMS, RVT, RDCS  Examination Guidelines: A complete evaluation includes B-mode imaging, spectral Doppler, color Doppler, and power Doppler as needed of all accessible portions of each vessel. Bilateral testing is considered an integral part of a complete examination. Limited examinations for reoccurring indications may be performed as noted.  +----------+-------------+----------+-----------+------------------+ RIGHT TCD Right VM (cm)Depth (cm)Pulsatility     Comment       +----------+-------------+----------+-----------+------------------+ MCA          154.00       5.00      0.73                       +----------+-------------+----------+-----------+------------------+ ACA          -54.00                 0.74                       +----------+-------------+----------+-----------+------------------+ Term ICA     129.00       5.60      0.74                       +----------+-------------+----------+-----------+------------------+ PCA           21.00                 1.31                       +----------+-------------+----------+-----------+------------------+ Opthalmic     28.00                 1.11                       +----------+-------------+----------+-----------+------------------+ ICA siphon                                  Unable to insonate +----------+-------------+----------+-----------+------------------+ Vertebral    -21.00                 0.64                        +----------+-------------+----------+-----------+------------------+ Distal ICA    26.00                                            +----------+-------------+----------+-----------+------------------+  +----------+------------+----------+-----------+-------+ LEFT TCD  Left VM (cm)Depth (cm)PulsatilityComment +----------+------------+----------+-----------+-------+ MCA          158.00      5.10      0.64            +----------+------------+----------+-----------+-------+ ACA          -37.00                0.59            +----------+------------+----------+-----------+-------+ Term ICA     29.00  0.75            +----------+------------+----------+-----------+-------+ PCA          25.00                 1.03            +----------+------------+----------+-----------+-------+ Opthalmic    21.00                 1.36            +----------+------------+----------+-----------+-------+ Vertebral    -28.00                0.94            +----------+------------+----------+-----------+-------+ Distal ICA   27.00                                 +----------+------------+----------+-----------+-------+  +------------+-------+------------------+             VM cm/s     Comment       +------------+-------+------------------+ Prox Basilar-65.00                    +------------+-------+------------------+ Dist Basilar       Unable to insonate +------------+-------+------------------+ +----------------------+---+ Right Lindegaard Ratio5.9 +----------------------+---+ +---------------------+---+ Left Lindegaard Ratio5.9 +---------------------+---+  Summary:  significantly elevated MVF at b/l MCAs and right terminal ICA with b/l elevated Lindegaard ratios indicating mild to moderate b/l MCA vasospam. clinical correlation strongly recommended. *See table(s) above for TCD measurements and observations.  Diagnosing physician: Marvel Plan MD  Electronically signed by Marvel Plan MD on 05/28/2021 at 2:36:17 PM.    Final    ECHOCARDIOGRAM LIMITED  Result Date: 05/23/2021    ECHOCARDIOGRAM LIMITED REPORT   Patient Name:   [name] [name] Date of Exam: 05/23/2021 Medical Rec #:  [id number]        Height:       69.0 in Accession #:    [id number]       Weight:       141.3 lb Date of Birth:  07-Sep-1961        BSA:          1.782 m Patient Age:    60 years         BP:           135/73 mmHg Patient Gender: M                HR:           85 bpm. Exam Location:  Inpatient Procedure: Limited Echo, Cardiac Doppler and Color Doppler STAT ECHO Indications:    Stroke  History:        Patient has no prior history of Echocardiogram examinations.                 Subarachnoid hemorrhage. H/o cocaine use.  Sonographer:    Ross Ludwig RDCS (AE) Referring Phys: [id number] Jadene Pierini IMPRESSIONS  1. Left ventricular ejection fraction, by estimation, is 60 to 65%. The left ventricle has normal function. The left ventricle has no regional wall motion abnormalities. Left ventricular diastolic parameters were normal.  2. Right ventricular systolic function is normal. The right ventricular size is normal.  3. The mitral valve is normal in structure. No evidence of mitral valve regurgitation. No evidence of mitral stenosis.  4. The aortic valve is tricuspid. Aortic valve regurgitation is not visualized. No aortic stenosis is present.  5. The inferior vena cava is normal in size with greater than 50% respiratory variability, suggesting right atrial pressure of 3 mmHg. FINDINGS  Left Ventricle: Left ventricular ejection fraction, by estimation, is 60 to 65%. The left ventricle has normal function. The left ventricle has no regional wall motion abnormalities. The left ventricular internal cavity size was normal in size. There is  no left ventricular hypertrophy. Left ventricular diastolic parameters were normal. Right Ventricle: The right ventricular size is normal.Right ventricular  systolic function is normal. Left Atrium: Left atrial size was normal in size. Right Atrium: Right atrial size was normal in size. Pericardium: There is no evidence of pericardial effusion. Mitral Valve: The mitral valve is normal in structure. No evidence of mitral valve stenosis. Tricuspid Valve: The tricuspid valve is normal in structure. Tricuspid valve regurgitation is trivial. No evidence of tricuspid stenosis. Aortic Valve: The aortic valve is tricuspid. Aortic valve regurgitation is not visualized. No aortic stenosis is present. Pulmonic Valve: The pulmonic valve was normal in structure. Pulmonic valve regurgitation is not visualized. No evidence of pulmonic stenosis. Aorta: The aortic root is normal in size and structure. Venous: The inferior vena cava is normal in size with greater than 50% respiratory variability, suggesting right atrial pressure of 3 mmHg. IAS/Shunts: No atrial level shunt detected by color flow Doppler. LEFT VENTRICLE PLAX 2D LVIDd:         4.40 cm  Diastology LVIDs:         2.90 cm  LV e' medial:    9.14 cm/s LV PW:         1.10 cm  LV E/e' medial:  8.9 LV IVS:        0.80 cm  LV e' lateral:   10.20 cm/s LVOT diam:     2.00 cm  LV E/e' lateral: 8.0 LVOT Area:     3.14 cm  IVC IVC diam: 1.30 cm LEFT ATRIUM         Index LA diam:    2.90 cm 1.63 cm/m   AORTA Ao Root diam: 3.40 cm Ao Asc diam:  3.10 cm MITRAL VALVE MV Area (PHT): 3.30 cm    SHUNTS MV Decel Time: 230 msec    Systemic Diam: 2.00 cm MV E velocity: 81.50 cm/s MV A velocity: 84.80 cm/s MV E/A ratio:  0.96 Olga Millers MD Electronically signed by Olga Millers MD Signature Date/Time: 05/23/2021/11:48:54 AM    Final    [place] EKG SITE RITE  Result Date: 05/23/2021 If Site Rite image not attached, placement could not be confirmed due to current cardiac rhythm.     Subjective: PT WITH SOME AGITATION OVERNIGHT RECEIVED HALDOL STABLE AND RESTING THIS AM, NO EVENTS THRUOUT THE DAY  Objective: Vitals:   06/19/21 0137  06/19/21 0518 06/19/21 0900 06/19/21 1209  BP: 134/87 124/89 129/84 110/78  Pulse: 82 85 82 85  Resp: 18 16 18 19   Temp: (!) 97 F (36.1 C) 98 F (36.7 C) 97.6 F (36.4 C) 98.4 F (36.9 C)  TempSrc: Oral Axillary Oral Oral  SpO2: 97% 99% 100% 100%  Weight:      Height:        Intake/Output Summary (Last 24 hours) at 06/19/2021 1553 Last data filed at 06/19/2021 0500 Gross per 24 hour  Intake --  Output 100 ml  Net -100 ml   Filed Weights   06/16/21 0335 06/17/21 0500 06/18/21 0500  Weight: 56.1 kg 57 kg 60.4 kg    Examination:  General  exam: MIN INTERACTIVE-UNCHANGED FROM PRIOR-NO EVID OF AGITATION Respiratory system: Clear to auscultation. Respiratory effort normal. Cardiovascular system: S1 & S2 heard, RRR. No JVD, murmurs, rubs, gallops or clicks. No pedal edema. Gastrointestinal system: Abdomen is nondistended, soft . Quiet bowel sounds heard. Central nervous system: min interactive reflexes in place Extremities:WWP, NO NEW CONTRACTURES Skin: No rashes, lesions or ulcers Psychiatry: Judgement and insight SEVERELY IMPAIRED, Mood & affect FLAT     Data Reviewed: I have personally reviewed following labs and imaging studies  CBC: Recent Labs  Lab 06/13/21 1009 06/16/21 0340  WBC 6.2 10.2  NEUTROABS 4.1  --   HGB 12.6* 13.4  HCT 37.7* 39.6  MCV 99.7 98.5  PLT 344 275   Basic Metabolic Panel: Recent Labs  Lab 06/13/21 1009 06/15/21 0747 06/17/21 0251  NA 143 144 143  K 3.8 5.0 3.5  CL 108 106 109  CO2 29 30 28   GLUCOSE 102* 111* 124*  BUN 16 16 20   CREATININE 0.58* 0.71 0.56*  CALCIUM 9.3 9.8 9.5   GFR: Estimated Creatinine Clearance: 84.9 mL/min (A) (by C-G formula based on SCr of 0.56 mg/dL (L)). Liver Function Tests: No results for input(s): AST, ALT, ALKPHOS, BILITOT, PROT, ALBUMIN in the last 168 hours. No results for input(s): LIPASE, AMYLASE in the last 168 hours. No results for input(s): AMMONIA in the last 168 hours. Coagulation  Profile: No results for input(s): INR, PROTIME in the last 168 hours. Cardiac Enzymes: No results for input(s): CKTOTAL, CKMB, CKMBINDEX, TROPONINI in the last 168 hours. BNP (last 3 results) No results for input(s): PROBNP in the last 8760 hours. HbA1C: No results for input(s): HGBA1C in the last 72 hours. CBG: Recent Labs  Lab 06/18/21 2045 06/19/21 0036 06/19/21 0419 06/19/21 0809 06/19/21 1213  GLUCAP 116* 112* 140* 116* 108*   Lipid Profile: No results for input(s): CHOL, HDL, LDLCALC, TRIG, CHOLHDL, LDLDIRECT in the last 72 hours. Thyroid Function Tests: No results for input(s): TSH, T4TOTAL, FREET4, T3FREE, THYROIDAB in the last 72 hours. Anemia Panel: No results for input(s): VITAMINB12, FOLATE, FERRITIN, TIBC, IRON, RETICCTPCT in the last 72 hours. Sepsis Labs: No results for input(s): PROCALCITON, LATICACIDVEN in the last 168 hours.  No results found for this or any previous visit (from the past 240 hour(s)).       Radiology Studies: No results found.      Scheduled Meds:  chlorhexidine  15 mL Mouth Rinse BID   Chlorhexidine Gluconate Cloth  6 each Topical Daily   feeding supplement (PROSource TF)  45 mL Per Tube BID   fiber  1 packet Per Tube BID   free water  100 mL Per Tube Q4H   heparin injection (subcutaneous)  5,000 Units Subcutaneous Q8H   mouth rinse  15 mL Mouth Rinse q12n4p   sodium chloride  1 g Per Tube BID WC   Continuous Infusions:  feeding supplement (JEVITY 1.2 CAL) 1,560 mL (06/17/21 1706)     LOS: 31 days    Time spent: 35 MIN    Burke Keels, MD [facility]  If 7PM-7AM, please contact night-coverage  06/19/2021, 3:53 PM

## 2021-06-19 NOTE — Plan of Care (Signed)
  Problem: Education: Goal: Knowledge of General Education information will improve Description: Including pain rating scale, medication(s)/side effects and non-pharmacologic comfort measures Outcome: Not Progressing   Problem: Health Behavior/Discharge Planning: Goal: Ability to manage health-related needs will improve Outcome: Progressing   Problem: Clinical Measurements: Goal: Ability to maintain clinical measurements within normal limits will improve Outcome: Progressing Goal: Will remain free from infection Outcome: Progressing Goal: Diagnostic test results will improve Outcome: Progressing Goal: Respiratory complications will improve Outcome: Progressing Goal: Cardiovascular complication will be avoided Outcome: Progressing   Problem: Activity: Goal: Risk for activity intolerance will decrease Outcome: Progressing   Problem: Nutrition: Goal: Adequate nutrition will be maintained Outcome: Progressing   Problem: Coping: Goal: Level of anxiety will decrease Outcome: Progressing   Problem: Elimination: Goal: Will not experience complications related to bowel motility Outcome: Progressing Goal: Will not experience complications related to urinary retention Outcome: Progressing   Problem: Pain Managment: Goal: General experience of comfort will improve Outcome: Progressing   Problem: Safety: Goal: Ability to remain free from injury will improve Outcome: Progressing   

## 2021-06-19 NOTE — Progress Notes (Signed)
[facility] night shift.  The nursing staff reported that the patient has been trying to get out of bed.  They are concerned about his risk of aspiration because they have not been able to keep him in bed at a 30 degree inclination as ordered.  He denies having pain and stated that he just wants to "get out of here".  Haloperidol 3 mg IVP x1 dose ordered.  Sanda Klein, MD

## 2021-06-20 LAB — GLUCOSE, CAPILLARY
Glucose-Capillary: 100 mg/dL — ABNORMAL HIGH (ref 70–99)
Glucose-Capillary: 111 mg/dL — ABNORMAL HIGH (ref 70–99)
Glucose-Capillary: 112 mg/dL — ABNORMAL HIGH (ref 70–99)
Glucose-Capillary: 118 mg/dL — ABNORMAL HIGH (ref 70–99)
Glucose-Capillary: 120 mg/dL — ABNORMAL HIGH (ref 70–99)
Glucose-Capillary: 127 mg/dL — ABNORMAL HIGH (ref 70–99)

## 2021-06-20 NOTE — Progress Notes (Signed)
PROGRESS NOTE    BRAYON BIELEFELD  WUJ:[id number] DOB: 08/26/1961 DOA: 05/19/2021 PCP: Pcp, No   Brief Narrative:  [name] [name] is an 60 y.o. male past medical history past medical history of alcohol abuse and drug abuse who suffered a subarachnoid hemorrhage status post coiling of the anterior communicating aneurysm, underwent VP shunting on 06/09/2019   Significant Events: 5/25 Admitted with [facility] secondary to ruptured aneurysm, ventriculostomy preformed in ED, Ancef started 5/26 Coil Embolization 5/29 worsening neurological exam consistent with vasospasm, hemodynamic augmentation initiated. 6/1 worsening hyponatremia: Na of 120.  Patient started on salt tablets 1 g twice daily and fludrocortisone 0.1 mg. 6/2 BP goal reduced 160 -180.  Salt Tablet increased to 1 g tid. Fludrocortisone increased 0.2 mg. 6/8 Ongoing agitation, intermittently following commands. CT Head with worsening hydrocephalus despite EVD, open to 10 per NSGY. 6/9 EVD remains in place, repeat head CT as below 6/10-12, no significant neuro changes, intermittently agitated.  Ongoing EVD, plans for VP shunt next week 6/13 no neuro changes, remains intermittently restless 6/14 went for VP shunt, no neuro changes, afebrile 6/21 status post PEG tube placement nutrition started 24 hours postplacement.   Awaiting skilled nursing facility placement   Assessment & Plan:   Active Problems:   Cerebral salt-wasting   Aneurysmal subarachnoid hemorrhage (HCC)   Vasospasm of cerebral artery   Diarrheal stools  Acute metabolic encephalopathy subarachnoid hemorrhage secondary to aneurysm rupture: Pt with some confusion overnight, required prn haldol, stable on my eval this AM Obstructive hydrocephalus status post EVD on 06/08/2021. Not able to tolerate his orals. PEG tube placement on 06/15/2021. Continue aspiration precaution and seizure precautions.  He is currently on Keppra for seizure prophylaxis. Physical therapy  evaluated the patient, recommended skilled nursing facility.   Hyponatremia suspect cerebral salt wasting syndrome: Resolved discontinued salt tablets.  Diarrhea: Continue loperamide.  Resolved  Physical deconditioning in the setting of subarachnoid hemorrhage: Will need to go to skilled nursing facility.   Nutrition: Continue nutrition per core track. IR placed PEG on 06/16/2021. BMP 6/25  Leukocytosis: Resolved, Likely stress margination has remained afebrile,  No indication for antibiotics.   DVT prophylaxis: SCD/Compression stockings  Code Status: FULL Code Status Orders  (From admission, onward)           Start     Ordered   05/19/21 2022  Full code  Continuous        05/19/21 2022           Code Status History     Date Active Date Inactive Code Status Order ID Comments User Context   10/31/2016 2152 11/01/2016 1945 Full Code [id number]  Clifton Custard Inpatient   10/31/2016 1308 10/31/2016 2047 Full Code [id number]  Dionne Ano ED   06/13/2013 1417 06/13/2013 2022 Full Code [id number]  Emilia Beck, PA-C ED      Family Communication: WILL TRY MOTHER MONDAY Disposition Plan:   SNF Consults called: None Admission status: Inpatient Status is: Inpatient   Remains inpatient appropriate because:Hemodynamically unstable   Dispo: The patient is from: Home              Anticipated d/c is to: Skilled nursing facility              Patient currently is not medically stable to d/c.              Difficult to place patient No   Consultants:  NSU, IR  Procedures:  CT ABDOMEN WO  CONTRAST  Result Date: 06/14/2021 CLINICAL DATA:  Evaluate anatomy for percutaneous gastrostomy tube placement. History of intracranial aneurysm and hemorrhage. EXAM: CT ABDOMEN WITHOUT CONTRAST TECHNIQUE: Multidetector CT imaging of the abdomen was performed following the standard protocol without IV contrast. COMPARISON:  None. FINDINGS: Lower chest: Few dependent  densities at the right lung base. Otherwise, the lung bases are clear. There appears to be a small hiatal hernia. Hepatobiliary: Normal appearance of the liver and gallbladder. No significant biliary dilatation. Pancreas: Unremarkable. No pancreatic ductal dilatation or surrounding inflammatory changes. Spleen: Normal in size without focal abnormality. Adrenals/Urinary Tract: Normal appearance of the adrenal glands. Normal appearance of both kidneys without stones or hydronephrosis. Stomach/Bowel: Nasogastric tube extends in the stomach and terminates near the duodenal bulb. The stomach is cephalad to the transverse colon. No significant bowel dilatation. No focal bowel inflammation. Vascular/Lymphatic: Atherosclerotic calcifications in the abdominal aorta without aneurysm. No significant lymph node enlargement in the abdomen. Other: VP shunt enters through the anterior right upper abdomen. There is a small amount of gas around the shunt tubing as it enters the abdominal cavity. There is also free air within the anterior upper abdomen likely secondary to the shunt placement. The shunt tubing coils around the right side of the abdomen and terminates in the mid upper abdomen adjacent to the mid transverse colon. No significant free fluid in the abdomen. Areas of subcutaneous edema in the anterior abdomen could be related to injection sites. Musculoskeletal: Sclerosis involving the anterior and mid L2 vertebral body is likely related to degenerative changes. No suspicious bone findings. IMPRESSION: 1. Stomach is located in the anterior upper abdomen and anatomy should be amendable for percutaneous gastrostomy tube placement. 2. Pneumoperitoneum which is likely secondary to recent placement of VP shunt. 3. Probable small hiatal hernia. Electronically Signed   By: Richarda Overlie M.D.   On: 06/14/2021 08:09   CT ANGIO HEAD W OR WO CONTRAST  Result Date: 05/23/2021 CLINICAL DATA:  Neuro deficit, acute stroke suspected.  Aneurysmal subarachnoid hemorrhage with suspected symptomatic vasospasm. EXAM: CT ANGIOGRAPHY HEAD TECHNIQUE: Multidetector CT imaging of the head was performed using the standard protocol during bolus administration of intravenous contrast. Multiplanar CT image reconstructions and MIPs were obtained to evaluate the vascular anatomy. CONTRAST:  51mL OMNIPAQUE IOHEXOL 350 MG/ML SOLN COMPARISON:  CT head May 20, 2021.  CTA May 19, 2021. FINDINGS: CT HEAD Brain: A right frontal ventriculostomy catheter with tip in similar position. Tube there is similar volume of intraventricular hemorrhage i with similar ventriculomegaly. Similar intraparenchymal hemorrhage in the left greater than right inferior frontal lobes, likely related to anterior communicating artery rupture. There is similar surrounding edema and subarachnoid hemorrhage. Additional areas of convexity subarachnoid hemorrhage have slightly decreased. No evidence of interval acute large vascular territory infarct. No progressive mass effect. . Vascular: See below. Skull: No acute fracture. Sinuses: Visualized sinuses are clear. Other: No mastoid effusions. CTA HEAD Anterior circulation: Bilateral ICAs are patent. Similar mild calcific atherosclerosis of the paraclinoid ICAs. Status post anterior communicating artery aneurysm coiling. Streak artifact largely obscures this region, including adjacent distal A1 and proximal A2 ACAs and the paraclinoid ICAs. The visualized MCAs and ACAs are patent. There is new fairly diffuse multifocal moderate irregularity and narrowing of the MCAs and ACAs. Posterior circulation: The intradural vertebral arteries are patent with mild narrowing. Patent basilar artery and bilateral posterior cerebral arteries. Venous sinuses: As permitted by contrast timing, patent. Review of the MIP images confirms the above findings. IMPRESSION: CT  head: 1. Similar intraparenchymal and intraventricular hemorrhage in this patient with suspected  prior anterior communicating artery rupture. No progressive ventriculomegaly or mass effect. Slightly improved convexity subarachnoid hemorrhage. 2. No evidence of acute interval large vascular territory infarct. MRI could provide more sensitive evaluation for acute infarct if clinically indicated. CTA 1. Multifocal moderate irregularity and narrowing of patent MCAs and ACAs bilaterally that is best appreciated on MIP imaging and compatible with vasospasm. Catheter arteriogram could further characterize if clinically indicated. 2. Status post anterior communicating artery aneurysm coiling with streak artifact obscuring this area. Electronically Signed   By: Feliberto Harts MD   On: 05/23/2021 18:13   CT HEAD WO CONTRAST  Addendum Date: 06/02/2021   ADDENDUM REPORT: 06/02/2021 11:46 ADDENDUM: Findings and recommendations discussed with Dr. Merrily Pew via telephone at 11:42 a.m. Electronically Signed   By: Feliberto Harts MD   On: 06/02/2021 11:46   Result Date: 06/02/2021 CLINICAL DATA:  Hydrocephalus. EXAM: CT HEAD WITHOUT CONTRAST TECHNIQUE: Contiguous axial images were obtained from the base of the skull through the vertex without intravenous contrast. COMPARISON:  CT May 23, 2020. FINDINGS: Brain: Significantly increased ventriculomegaly, compatible with worsening hydrocephalus. Right frontal approach ventriculostomy catheter with the tip at the right lateral ventricle near the foramen of Monro, similar to prior. Visualized portions of the shunt catheter appear intact. Small (7 mm) focus of hyperdense hemorrhage in the left pericallosal frontal lobe (series 3, image 19 and series 6, image 38) is concerning for a new/interval acute hemorrhage given no definite hyperdense hemorrhage at this site on the prior, although a direct comparison is limited given evolving blood products. Otherwise, decreased size and density of intraparenchymal hemorrhage in the inferior left frontal lobe and corpus callosum. Hypodensity  in these areas likely represents edema and developing encephalomalacia. Small volume of surrounding subarachnoid hemorrhage, decreased. No evidence of interval acute large vascular territory infarct. Decreased layering intraventricular hemorrhage. Significantly increased size of the lateral and third ventricles, compatible with worsening hydrocephalus. There is rounding of the temporal horns and outward convexity of the third ventricle. Vascular: No hyperdense vessel identified. Sequela of left anterior communicating artery aneurysm coiling. Surrounding streak artifact limits evaluation in this region. Skull: Right frontal burr hole.  No acute fracture. Sinuses/Orbits: Visualized sinuses are clear. Other: No mastoid effusions. IMPRESSION: 1. Significantly increased ventriculomegaly, compatible with worsening hydrocephalus. 2. Small (7 mm) focus of hyperdense hemorrhage in the left pericallosal frontal lobe is concerning for a new/interval site of acute hemorrhage given no definite hyperdense hemorrhage at this site on the prior, although a direct comparison is limited given evolving blood products. Recommend attention on close interval follow-up. 3. Otherwise, improving intraparenchymal, subarachnoid, and intraventricular hemorrhage with edema and suspected developing encephalomalacia in the inferior left frontal lobe and corpus callosum. Electronically Signed: By: Feliberto Harts MD On: 06/02/2021 11:33   IR GASTROSTOMY TUBE MOD SED  Result Date: 06/15/2021 INDICATION: Dysphagia second to subarachnoid hemorrhage post coiling of A-comm aneurysm and VP shunt placement. Please perform percutaneous gastrostomy tube placement for enteric nutrition supplementation purposes. EXAM: PULL TROUGH GASTROSTOMY TUBE PLACEMENT COMPARISON:  None. MEDICATIONS: Ancef 2 gm IV; Antibiotics were administered within 1 hour of the procedure. Glucagon 1 mg IV CONTRAST:  15 cc of Omnipaque 300 administered into the gastric lumen.  ANESTHESIA/SEDATION: Moderate (conscious) sedation was employed during this procedure. A total of Versed 1 mg and Fentanyl 50 mcg was administered intravenously. Moderate Sedation Time: 33 minutes. The patient's level of consciousness and vital signs were monitored  continuously by radiology nursing throughout the procedure under my direct supervision. FLUOROSCOPY TIME:  6 minutes, 48 seconds (46 mGy) COMPLICATIONS: None immediate. PROCEDURE: Informed written consent was obtained from the patient's family following explanation of the procedure, risks, benefits and alternatives. A time out was performed prior to the initiation of the procedure. Ultrasound scanning was performed to demarcate the edge of the left lobe of the liver. Maximal barrier sterile technique utilized including caps, mask, sterile gowns, sterile gloves, large sterile drape, hand hygiene and Betadine prep. The left upper quadrant was sterilely prepped and draped. An oral gastric catheter was inserted into the stomach under fluoroscopy. The existing nasogastric feeding tube was removed. The left costal margin and air opacified transverse colon were identified and avoided. Air was injected into the stomach for insufflation and visualization under fluoroscopy. Under sterile conditions a 17 gauge trocar needle was utilized to access the stomach percutaneously beneath the left subcostal margin after the overlying soft tissues were anesthetized with 1% Lidocaine with epinephrine. Special attention was made to avoid the tip of the ventriculoperitoneal catheter tubing. Needle position was confirmed within the stomach with aspiration of air and injection of small amount of contrast. A single T tack was deployed for gastropexy. Over an Amplatz guide wire, a 9-French sheath was inserted into the stomach. A snare device was utilized to capture the oral gastric catheter. The snare device was pulled retrograde from the stomach up the esophagus and out the  oropharynx. The 20-French pull-through gastrostomy was connected to the snare device and pulled antegrade through the oropharynx down the esophagus into the stomach and then through the percutaneous tract external to the patient. The gastrostomy was assembled externally. Contrast injection confirms appropriate positioning within the stomach. Several spot radiographic images were obtained in various obliquities for documentation. Dressings were applied. The patient tolerated procedure well without immediate post procedural complication. FINDINGS: After successful fluoroscopic guided placement, the gastrostomy tube is appropriately positioned with internal disc positioned against the inner ventral wall of the gastric lumen. IMPRESSION: Successful fluoroscopic insertion of a 20-French pull-through gastrostomy tube. The gastrostomy may be used immediately for medication administration and in 24 hrs for the initiation of feeds. Electronically Signed   By: Simonne ComeJohn  Watts M.D.   On: 06/15/2021 16:38   DG CHEST PORT 1 VIEW  Result Date: 05/23/2021 CLINICAL DATA:  Subarachnoid hemorrhage EXAM: PORTABLE CHEST 1 VIEW COMPARISON:  May 19, 2021 FINDINGS: The cardiomediastinal silhouette is unchanged in contour.Enteric tube tip projects over the proximal duodenum. No pleural effusion. No pneumothorax. LEFT basilar linear opacity. Visualized abdomen is unremarkable. Multilevel degenerative changes of the thoracic spine. IMPRESSION: LEFT basilar linear opacity with differential considerations including infection or atelectasis. Electronically Signed   By: Meda KlinefelterStephanie  Peacock MD   On: 05/23/2021 11:38   ECHOCARDIOGRAM COMPLETE  Result Date: 05/26/2021    ECHOCARDIOGRAM REPORT   Patient Name:   Ivor CostaNTHONY B Grima Date of Exam: 05/26/2021 Medical Rec #:  [id number]        Height:       69.0 in Accession #:    [id number]       Weight:       136.5 lb Date of Birth:  [birth year]        BSA:          1.756 m Patient Age:    60 years          BP:           184/87 mmHg Patient Gender: M  HR:           91 bpm. Exam Location:  Inpatient Procedure: 2D Echo, 3D Echo, Color Doppler and Cardiac Doppler Indications:    I42.9 Cardiomyopathy (unspecified)  History:        Patient has prior history of Echocardiogram examinations, most                 recent 05/23/2021.  Sonographer:    Irving Burton Senior RDCS Referring Phys: [id number] RAVI AGARWALA IMPRESSIONS  1. Left ventricular ejection fraction by 3D volume is 63 %. The left ventricle has normal function. The left ventricle has no regional wall motion abnormalities. Left ventricular diastolic parameters are consistent with Grade I diastolic dysfunction (impaired relaxation).  2. Right ventricular systolic function is normal. The right ventricular size is normal. Tricuspid regurgitation signal is inadequate for assessing PA pressure.  3. The mitral valve is normal in structure. Trivial mitral valve regurgitation. No evidence of mitral stenosis.  4. The aortic valve is tricuspid. Aortic valve regurgitation is not visualized. No aortic stenosis is present.  5. The inferior vena cava is normal in size with greater than 50% respiratory variability, suggesting right atrial pressure of 3 mmHg. FINDINGS  Left Ventricle: Left ventricular ejection fraction by 3D volume is 63 %. The left ventricle has normal function. The left ventricle has no regional wall motion abnormalities. The left ventricular internal cavity size was normal in size. There is no left  ventricular hypertrophy. Left ventricular diastolic parameters are consistent with Grade I diastolic dysfunction (impaired relaxation). Right Ventricle: The right ventricular size is normal. No increase in right ventricular wall thickness. Right ventricular systolic function is normal. Tricuspid regurgitation signal is inadequate for assessing PA pressure. Left Atrium: Left atrial size was normal in size. Right Atrium: Right atrial size was normal in size.  Pericardium: There is no evidence of pericardial effusion. Mitral Valve: The mitral valve is normal in structure. Trivial mitral valve regurgitation. No evidence of mitral valve stenosis. Tricuspid Valve: The tricuspid valve is normal in structure. Tricuspid valve regurgitation is not demonstrated. No evidence of tricuspid stenosis. Aortic Valve: The aortic valve is tricuspid. Aortic valve regurgitation is not visualized. No aortic stenosis is present. Pulmonic Valve: The pulmonic valve was normal in structure. Pulmonic valve regurgitation is trivial. No evidence of pulmonic stenosis. Aorta: The aortic root is normal in size and structure. Venous: The inferior vena cava is normal in size with greater than 50% respiratory variability, suggesting right atrial pressure of 3 mmHg. IAS/Shunts: No atrial level shunt detected by color flow Doppler.  LEFT VENTRICLE PLAX 2D LVIDd:         4.30 cm         Diastology LVIDs:         2.90 cm         LV e' medial:    7.62 cm/s LV PW:         0.90 cm         LV E/e' medial:  9.3 LV IVS:        0.60 cm         LV e' lateral:   10.00 cm/s LVOT diam:     2.00 cm         LV E/e' lateral: 7.1 LV SV:         70 LV SV Index:   40 LVOT Area:     3.14 cm        3D Volume EF  LV 3D EF:    Left                                             ventricular                                             ejection                                             fraction by                                             3D volume                                             is 63 %.                                 3D Volume EF:                                3D EF:        63 %                                LV EDV:       124 ml                                LV ESV:       46 ml                                LV SV:        77 ml RIGHT VENTRICLE RV S prime:     7.72 cm/s TAPSE (M-mode): 1.8 cm LEFT ATRIUM             Index       RIGHT ATRIUM           Index LA diam:        3.90 cm  2.22 cm/m  RA Area:     11.20 cm LA Vol (A2C):   38.0 ml 21.64 ml/m RA Volume:   22.40 ml  12.75 ml/m LA Vol (A4C):   40.2 ml 22.89 ml/m LA Biplane Vol: 39.7 ml 22.61 ml/m  AORTIC VALVE LVOT Vmax:   140.00 cm/s LVOT Vmean:  95.000 cm/s LVOT VTI:    0.223 m  AORTA Ao Root diam: 3.30 cm Ao Asc diam:  3.10 cm MITRAL VALVE MV Area (PHT): 3.21 cm    SHUNTS MV Decel Time: 236 msec    Systemic VTI:  0.22  m MV E velocity: 71.00 cm/s  Systemic Diam: 2.00 cm MV A velocity: 94.00 cm/s MV E/A ratio:  0.76 Weston Brass MD Electronically signed by Weston Brass MD Signature Date/Time: 05/26/2021/9:20:19 PM    Final    VAS [place] TRANSCRANIAL DOPPLER  Result Date: 06/04/2021  Transcranial Doppler Patient Name:  JSEAN TAUSSIG  Date of Exam:   06/02/2021 Medical Rec #: [id number]         Accession #:    [id number] Date of Birth: 06-Dec-1961         Patient Gender: M Patient Age:   60Y Exam Location:  [facility] Procedure:      VAS [place] TRANSCRANIAL DOPPLER Referring Phys: [id number] Donalee Citrin --------------------------------------------------------------------------------  Indications: Subarachnoid hemorrhage. Limitations: Patient unable to remain stationary for duration of examination. Comparison Study: Most recent prior study 05-31-2021 Performing Technologist: Jean Rosenthal RDMS,RVT  Examination Guidelines: A complete evaluation includes B-mode imaging, spectral Doppler, color Doppler, and power Doppler as needed of all accessible portions of each vessel. Bilateral testing is considered an integral part of a complete examination. Limited examinations for reoccurring indications may be performed as noted.  +----------+-------------+----------+-----------+-------+ RIGHT TCD Right VM (cm)Depth (cm)PulsatilityComment +----------+-------------+----------+-----------+-------+ MCA          119.00       5.50      0.72            +----------+-------------+----------+-----------+-------+ ACA          -31.00                  0.68            +----------+-------------+----------+-----------+-------+ Term ICA     111.00       5.60      0.67            +----------+-------------+----------+-----------+-------+ PCA           38.00                 0.67            +----------+-------------+----------+-----------+-------+ Opthalmic     29.00                 1.24            +----------+-------------+----------+-----------+-------+ ICA siphon    29.00                 1.17            +----------+-------------+----------+-----------+-------+ Vertebral    -20.00                 0.79            +----------+-------------+----------+-----------+-------+ Distal ICA    23.00                 0.82            +----------+-------------+----------+-----------+-------+  +----------+------------+----------+-----------+-------+ LEFT TCD  Left VM (cm)Depth (cm)PulsatilityComment +----------+------------+----------+-----------+-------+ MCA          130.00      4.10      0.86            +----------+------------+----------+-----------+-------+ ACA          -26.00                0.32            +----------+------------+----------+-----------+-------+ Term ICA     36.00       5.70      0.79            +----------+------------+----------+-----------+-------+  PCA          25.00                 1.16            +----------+------------+----------+-----------+-------+ Opthalmic    27.00                 1.01            +----------+------------+----------+-----------+-------+ ICA siphon   34.00                 1.29            +----------+------------+----------+-----------+-------+ Vertebral    -22.00                0.61            +----------+------------+----------+-----------+-------+ Distal ICA   20.00                 0.86            +----------+------------+----------+-----------+-------+  +------------+-------+------------------+             VM cm/s     Comment        +------------+-------+------------------+ Prox Basilar       Unable to insonate +------------+-------+------------------+ +----------------------+----+ Right Lindegaard Ratio5.17 +----------------------+----+ +---------------------+----+ Left Lindegaard Ratio6.50 +---------------------+----+  Summary:  Elevated mean flow velocities in bilateral middle cerebral and terminal right internal carotid arteries suggestive of mild vasospasm. *See table(s) above for TCD measurements and observations.  Diagnosing physician: Delia Heady MD Electronically signed by Delia Heady MD on 06/04/2021 at 12:01:08 PM.    Final    VAS [place] TRANSCRANIAL DOPPLER  Result Date: 05/31/2021  Transcranial Doppler Patient Name:  AYDIEN MAJETTE  Date of Exam:   05/31/2021 Medical Rec #: [id number]         Accession #:    [id number] Date of Birth: 1961/09/11         Patient Gender: M Patient Age:   60Y Exam Location:  [facility] Procedure:      VAS [place] TRANSCRANIAL DOPPLER Referring Phys: [id number] Donalee Citrin --------------------------------------------------------------------------------  Indications: Subarachnoid hemorrhage. Comparison Study: 05/28/21- TCD Performing Technologist: Gertie Fey MHA, RDMS, RVT, RDCS  Examination Guidelines: A complete evaluation includes B-mode imaging, spectral Doppler, color Doppler, and power Doppler as needed of all accessible portions of each vessel. Bilateral testing is considered an integral part of a complete examination. Limited examinations for reoccurring indications may be performed as noted.  +----------+-------------+----------+-----------+------------------+ RIGHT TCD Right VM (cm)Depth (cm)Pulsatility     Comment       +----------+-------------+----------+-----------+------------------+ MCA           92.00       4.70      0.96                       +----------+-------------+----------+-----------+------------------+ ACA          -26.00                 0.75                        +----------+-------------+----------+-----------+------------------+ Term ICA      21.00                 1.26                       +----------+-------------+----------+-----------+------------------+ PCA  18.00                 1.21                       +----------+-------------+----------+-----------+------------------+ Opthalmic     20.00                 1.24                       +----------+-------------+----------+-----------+------------------+ ICA siphon                                  Unable to insonate +----------+-------------+----------+-----------+------------------+ Vertebral                                   Unable to insonate +----------+-------------+----------+-----------+------------------+ Distal ICA    36.00                                            +----------+-------------+----------+-----------+------------------+  +----------+------------+----------+-----------+------------------+ LEFT TCD  Left VM (cm)Depth (cm)Pulsatility     Comment       +----------+------------+----------+-----------+------------------+ MCA          93.00                 0.99                       +----------+------------+----------+-----------+------------------+ ACA                                        Unable to insonate +----------+------------+----------+-----------+------------------+ Term ICA     46.00                 0.84                       +----------+------------+----------+-----------+------------------+ PCA                                        Unable to insonate +----------+------------+----------+-----------+------------------+ Opthalmic    24.00                 1.24                       +----------+------------+----------+-----------+------------------+ ICA siphon                                 Unable to insonate +----------+------------+----------+-----------+------------------+ Vertebral                                   Unable to insonate +----------+------------+----------+-----------+------------------+ Distal ICA   28.00                                            +----------+------------+----------+-----------+------------------+  +------------+-------+------------------+  VM cm/s     Comment       +------------+-------+------------------+ Prox Basilar       Unable to insonate +------------+-------+------------------+ +----------------------+----+ Right Lindegaard Ratio2.56 +----------------------+----+ +---------------------+----+ Left Lindegaard Ratio3.32 +---------------------+----+  Summary:  Mildly elevared bilateral midle cerebral artery mean flow velocities of unclear significance.Not all vessels could be studied due to technical difficulties. *See table(s) above for TCD measurements and observations.  Diagnosing physician: Delia Heady MD Electronically signed by Delia Heady MD on 05/31/2021 at 4:39:43 PM.    Final    VAS [place] TRANSCRANIAL DOPPLER  Result Date: 05/28/2021  Transcranial Doppler Patient Name:  DOMENICO ACHORD  Date of Exam:   05/28/2021 Medical Rec #: [id number]         Accession #:    [id number] Date of Birth: October 30, 1961         Patient Gender: M Patient Age:   60Y Exam Location:  [facility] Procedure:      VAS [place] TRANSCRANIAL DOPPLER Referring Phys: [id number] Donalee Citrin --------------------------------------------------------------------------------  Indications: Subarachnoid hemorrhage. 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 Limitations: Patient movement throughout the exam Limitations for diagnostic windows: Unable to insonate right transtemporal window. Unable to insonate left transtemporal window. Unable to insonate occipital window. Performing Technologist: Marilynne Halsted RDMS, RVT  Examination Guidelines: A complete evaluation includes B-mode imaging, spectral Doppler, color Doppler, and power Doppler as  needed of all accessible portions of each vessel. Bilateral testing is considered an integral part of a complete examination. Limited examinations for reoccurring indications may be performed as noted.  +----------+-------------+----------+-----------+-------------+ RIGHT TCD Right VM (cm)Depth (cm)Pulsatility   Comment    +----------+-------------+----------+-----------+-------------+ MCA          155.00       5.60      0.71                  +----------+-------------+----------+-----------+-------------+ ACA                                         not insonated +----------+-------------+----------+-----------+-------------+ Term ICA                                    not insonated +----------+-------------+----------+-----------+-------------+ PCA           30.00                 0.70                  +----------+-------------+----------+-----------+-------------+ Opthalmic                                   not insonated +----------+-------------+----------+-----------+-------------+ ICA siphon                                  not insonated +----------+-------------+----------+-----------+-------------+ Vertebral                                   not insonated +----------+-------------+----------+-----------+-------------+ Distal ICA  not insonated +----------+-------------+----------+-----------+-------------+  +----------+------------+----------+-----------+-------------+ LEFT TCD  Left VM (cm)Depth (cm)Pulsatility   Comment    +----------+------------+----------+-----------+-------------+ MCA          166.00      4.20      0.81                  +----------+------------+----------+-----------+-------------+ ACA                                        not insonated +----------+------------+----------+-----------+-------------+ Term ICA                                   not insonated  +----------+------------+----------+-----------+-------------+ PCA          54.00                 0.83                  +----------+------------+----------+-----------+-------------+ Opthalmic                                  not insonated +----------+------------+----------+-----------+-------------+ ICA siphon                                 not insonated +----------+------------+----------+-----------+-------------+ Vertebral                                  not insonated +----------+------------+----------+-----------+-------------+ Distal ICA                                 not insonated +----------+------------+----------+-----------+-------------+  +------------+-------+-------------+             VM cm/s   Comment    +------------+-------+-------------+ Prox Basilar       not insonated +------------+-------+-------------+ Dist Basilar       not insonated +------------+-------+-------------+ Summary:  patient movement limited today's study. only b/l MCA and PCA were able to recorded. with this limited data, still has elevated b/l MCA MVFs, not significantly changed from prior, still suggesting moderate vasospasm b/l MCAs. clinical correlation recommended. *See table(s) above for TCD measurements and observations.  Diagnosing physician: Marvel Plan MD Electronically signed by Marvel Plan MD on 05/28/2021 at 2:46:59 PM.    Final    VAS [place] TRANSCRANIAL DOPPLER  Result Date: 05/28/2021  Transcranial Doppler Patient Name:  BRY CAMPOVERDE  Date of Exam:   05/26/2021 Medical Rec #: [id number]         Accession #:    [id number] Date of Birth: August 16, 1961         Patient Gender: M Patient Age:   60Y Exam Location:  [facility] Procedure:      VAS [place] TRANSCRANIAL DOPPLER Referring Phys: [id number] Donalee Citrin --------------------------------------------------------------------------------  Indications: Subarachnoid hemorrhage. Limitations: Patient position/ lines/ drains Comparison  Study: 05/24/21 Performing Technologist: Jeb Levering RDMS, RVT  Examination Guidelines: A complete evaluation includes B-mode imaging, spectral Doppler, color Doppler, and power Doppler as needed of all accessible portions of each vessel. Bilateral testing is considered an integral part of a complete examination. Limited examinations for reoccurring indications may be  performed as noted.  +----------+-------------+----------+-----------+--------------+ RIGHT TCD Right VM (cm)Depth (cm)Pulsatility   Comment     +----------+-------------+----------+-----------+--------------+ MCA          128.00                 0.84                   +----------+-------------+----------+-----------+--------------+ ACA          -144.00                0.67                   +----------+-------------+----------+-----------+--------------+ Term ICA      67.00                 0.98                   +----------+-------------+----------+-----------+--------------+ PCA           29.00                 0.96                   +----------+-------------+----------+-----------+--------------+ Opthalmic     22.00                 0.95                   +----------+-------------+----------+-----------+--------------+ ICA siphon                                  not visualized +----------+-------------+----------+-----------+--------------+ Vertebral    -52.00                                        +----------+-------------+----------+-----------+--------------+ Distal ICA    38.00                                        +----------+-------------+----------+-----------+--------------+  +----------+------------+----------+-----------+--------------+ LEFT TCD  Left VM (cm)Depth (cm)Pulsatility   Comment     +----------+------------+----------+-----------+--------------+ MCA          156.00                0.76                   +----------+------------+----------+-----------+--------------+  ACA          -55.00                0.76                   +----------+------------+----------+-----------+--------------+ Term ICA     38.00                 0.95                   +----------+------------+----------+-----------+--------------+ PCA          30.00                 0.96                   +----------+------------+----------+-----------+--------------+ Opthalmic    20.00                 1.73                   +----------+------------+----------+-----------+--------------+  ICA siphon                                 not visualized +----------+------------+----------+-----------+--------------+ Vertebral    -53.00                                       +----------+------------+----------+-----------+--------------+ Distal ICA   33.00                                        +----------+------------+----------+-----------+--------------+  +------------+-------+-------+             VM cm/sComment +------------+-------+-------+ Prox Basilar-58.00         +------------+-------+-------+ Dist Basilar-47.00         +------------+-------+-------+ +----------------------+----+ Right Lindegaard Ratio3.37 +----------------------+----+ +---------------------+----+ Left Lindegaard Ratio4.73 +---------------------+----+  Summary:  elevated b/l MCA MFVs as well as b/l Lindegaard ratios no significant or slightly improved from prior, still suggesting mild to moderate vasospasm more on the left. right ACA MFV also elevated this time which was not present on prior study. However, left  ACA elevated MFV seems resolved. continue monitoring warranted. clinical correlation recommended. *See table(s) above for TCD measurements and observations.  Diagnosing physician: Marvel Plan MD Electronically signed by Marvel Plan MD on 05/28/2021 at 2:45:06 PM.    Final    VAS [place] TRANSCRANIAL DOPPLER  Result Date: 05/28/2021  Transcranial Doppler Patient Name:  JAC ROMULUS  Date of  Exam:   05/24/2021 Medical Rec #: [id number]         Accession #:    [id number] Date of Birth: 1961/08/27         Patient Gender: M Patient Age:   60Y Exam Location:  [facility] Procedure:      VAS [place] TRANSCRANIAL DOPPLER Referring Phys: [id number] Donalee Citrin --------------------------------------------------------------------------------  Indications: Subarachnoid hemorrhage. Limitations: Patient unable to cooperate; combative. Limitations for diagnostic windows: Unable to insonate occipital window. Comparison Study: 05/21/21- TCD Performing Technologist: Gertie Fey MHA, RDMS, RVT, RDCS  Examination Guidelines: A complete evaluation includes B-mode imaging, spectral Doppler, color Doppler, and power Doppler as needed of all accessible portions of each vessel. Bilateral testing is considered an integral part of a complete examination. Limited examinations for reoccurring indications may be performed as noted.  +----------+-------------+----------+-----------+------------------+ RIGHT TCD Right VM (cm)Depth (cm)Pulsatility     Comment       +----------+-------------+----------+-----------+------------------+ MCA          135.00       5.40      0.94                       +----------+-------------+----------+-----------+------------------+ ACA          -38.00                 1.13                       +----------+-------------+----------+-----------+------------------+ Term ICA      21.00                 1.20                       +----------+-------------+----------+-----------+------------------+ PCA  46.00                 0.90                       +----------+-------------+----------+-----------+------------------+ Opthalmic     17.00                 1.27                       +----------+-------------+----------+-----------+------------------+ ICA siphon    21.00                  1.2                        +----------+-------------+----------+-----------+------------------+ Vertebral                                   Unable to insonate +----------+-------------+----------+-----------+------------------+ Distal ICA    29.00                                            +----------+-------------+----------+-----------+------------------+  +----------+------------+----------+-----------+------------------+ LEFT TCD  Left VM (cm)Depth (cm)Pulsatility     Comment       +----------+------------+----------+-----------+------------------+ MCA          142.00      5.40      0.79                       +----------+------------+----------+-----------+------------------+ ACA         -148.00                0.53                       +----------+------------+----------+-----------+------------------+ Term ICA     99.00                 0.84                       +----------+------------+----------+-----------+------------------+ PCA          20.00                 1.00                       +----------+------------+----------+-----------+------------------+ Opthalmic    25.00                 1.37                       +----------+------------+----------+-----------+------------------+ ICA siphon   47.00                 1.03                       +----------+------------+----------+-----------+------------------+ Vertebral                                  Unable to insonate +----------+------------+----------+-----------+------------------+ Distal ICA   32.00                                            +----------+------------+----------+-----------+------------------+  +------------+-------+------------------+  VM cm/s     Comment       +------------+-------+------------------+ Prox Basilar       Unable to insonate +------------+-------+------------------+ Dist Basilar       Unable to insonate +------------+-------+------------------+  +----------------------+----+ Right Lindegaard Ratio4.66 +----------------------+----+ +---------------------+----+ Left Lindegaard Ratio4.44 +---------------------+----+  Summary:  elevated MFVs at b/l MCAs, left ACA and terminal ICA suggesting b/l mild to moderate vasospasm. however, the b/l MCA MFVs seem improved from 3 days ago, as well as the b/l Lindegaard ratios. Yet the MFV of left ACA was significant elevated from 3 days ago, further close monitoring warranted. clinical correlation recommended. *See table(s) above for TCD measurements and observations.  Diagnosing physician: Marvel Plan MD Electronically signed by Marvel Plan MD on 05/28/2021 at 2:41:32 PM.    Final    VAS [place] TRANSCRANIAL DOPPLER  Result Date: 05/28/2021  Transcranial Doppler Patient Name:  JAKEEM GRAPE  Date of Exam:   05/21/2021 Medical Rec #: [id number]         Accession #:    [id number] Date of Birth: Apr 14, 1961         Patient Gender: M Patient Age:   60Y Exam Location:  [facility] Procedure:      VAS [place] TRANSCRANIAL DOPPLER Referring Phys: [id number] Donalee Citrin --------------------------------------------------------------------------------  Indications: Subarachnoid hemorrhage. History: History of drug and alcohol abuse. No other significant history. Comparison Study: No prior study Performing Technologist: Gertie Fey MHA, RDMS, RVT, RDCS  Examination Guidelines: A complete evaluation includes B-mode imaging, spectral Doppler, color Doppler, and power Doppler as needed of all accessible portions of each vessel. Bilateral testing is considered an integral part of a complete examination. Limited examinations for reoccurring indications may be performed as noted.  +----------+-------------+----------+-----------+------------------+ RIGHT TCD Right VM (cm)Depth (cm)Pulsatility     Comment       +----------+-------------+----------+-----------+------------------+ MCA          154.00       5.00      0.73                        +----------+-------------+----------+-----------+------------------+ ACA          -54.00                 0.74                       +----------+-------------+----------+-----------+------------------+ Term ICA     129.00       5.60      0.74                       +----------+-------------+----------+-----------+------------------+ PCA           21.00                 1.31                       +----------+-------------+----------+-----------+------------------+ Opthalmic     28.00                 1.11                       +----------+-------------+----------+-----------+------------------+ ICA siphon                                  Unable to insonate +----------+-------------+----------+-----------+------------------+ Vertebral    -21.00  0.64                       +----------+-------------+----------+-----------+------------------+ Distal ICA    26.00                                            +----------+-------------+----------+-----------+------------------+  +----------+------------+----------+-----------+-------+ LEFT TCD  Left VM (cm)Depth (cm)PulsatilityComment +----------+------------+----------+-----------+-------+ MCA          158.00      5.10      0.64            +----------+------------+----------+-----------+-------+ ACA          -37.00                0.59            +----------+------------+----------+-----------+-------+ Term ICA     29.00                 0.75            +----------+------------+----------+-----------+-------+ PCA          25.00                 1.03            +----------+------------+----------+-----------+-------+ Opthalmic    21.00                 1.36            +----------+------------+----------+-----------+-------+ Vertebral    -28.00                0.94            +----------+------------+----------+-----------+-------+ Distal ICA   27.00                                  +----------+------------+----------+-----------+-------+  +------------+-------+------------------+             VM cm/s     Comment       +------------+-------+------------------+ Prox Basilar-65.00                    +------------+-------+------------------+ Dist Basilar       Unable to insonate +------------+-------+------------------+ +----------------------+---+ Right Lindegaard Ratio5.9 +----------------------+---+ +---------------------+---+ Left Lindegaard Ratio5.9 +---------------------+---+  Summary:  significantly elevated MVF at b/l MCAs and right terminal ICA with b/l elevated Lindegaard ratios indicating mild to moderate b/l MCA vasospam. clinical correlation strongly recommended. *See table(s) above for TCD measurements and observations.  Diagnosing physician: Marvel Plan MD Electronically signed by Marvel Plan MD on 05/28/2021 at 2:36:17 PM.    Final    ECHOCARDIOGRAM LIMITED  Result Date: 05/23/2021    ECHOCARDIOGRAM LIMITED REPORT   Patient Name:   ROBERT SPERL Date of Exam: 05/23/2021 Medical Rec #:  [id number]        Height:       69.0 in Accession #:    [id number]       Weight:       141.3 lb Date of Birth:  04-26-1961        BSA:          1.782 m Patient Age:    60 years         BP:           135/73 mmHg Patient Gender: M  HR:           85 bpm. Exam Location:  Inpatient Procedure: Limited Echo, Cardiac Doppler and Color Doppler STAT ECHO Indications:    Stroke  History:        Patient has no prior history of Echocardiogram examinations.                 Subarachnoid hemorrhage. H/o cocaine use.  Sonographer:    Ross Ludwig RDCS (AE) Referring Phys: [id number] Jadene Pierini IMPRESSIONS  1. Left ventricular ejection fraction, by estimation, is 60 to 65%. The left ventricle has normal function. The left ventricle has no regional wall motion abnormalities. Left ventricular diastolic parameters were normal.  2. Right ventricular systolic function is  normal. The right ventricular size is normal.  3. The mitral valve is normal in structure. No evidence of mitral valve regurgitation. No evidence of mitral stenosis.  4. The aortic valve is tricuspid. Aortic valve regurgitation is not visualized. No aortic stenosis is present.  5. The inferior vena cava is normal in size with greater than 50% respiratory variability, suggesting right atrial pressure of 3 mmHg. FINDINGS  Left Ventricle: Left ventricular ejection fraction, by estimation, is 60 to 65%. The left ventricle has normal function. The left ventricle has no regional wall motion abnormalities. The left ventricular internal cavity size was normal in size. There is  no left ventricular hypertrophy. Left ventricular diastolic parameters were normal. Right Ventricle: The right ventricular size is normal.Right ventricular systolic function is normal. Left Atrium: Left atrial size was normal in size. Right Atrium: Right atrial size was normal in size. Pericardium: There is no evidence of pericardial effusion. Mitral Valve: The mitral valve is normal in structure. No evidence of mitral valve stenosis. Tricuspid Valve: The tricuspid valve is normal in structure. Tricuspid valve regurgitation is trivial. No evidence of tricuspid stenosis. Aortic Valve: The aortic valve is tricuspid. Aortic valve regurgitation is not visualized. No aortic stenosis is present. Pulmonic Valve: The pulmonic valve was normal in structure. Pulmonic valve regurgitation is not visualized. No evidence of pulmonic stenosis. Aorta: The aortic root is normal in size and structure. Venous: The inferior vena cava is normal in size with greater than 50% respiratory variability, suggesting right atrial pressure of 3 mmHg. IAS/Shunts: No atrial level shunt detected by color flow Doppler. LEFT VENTRICLE PLAX 2D LVIDd:         4.40 cm  Diastology LVIDs:         2.90 cm  LV e' medial:    9.14 cm/s LV PW:         1.10 cm  LV E/e' medial:  8.9 LV IVS:         0.80 cm  LV e' lateral:   10.20 cm/s LVOT diam:     2.00 cm  LV E/e' lateral: 8.0 LVOT Area:     3.14 cm  IVC IVC diam: 1.30 cm LEFT ATRIUM         Index LA diam:    2.90 cm 1.63 cm/m   AORTA Ao Root diam: 3.40 cm Ao Asc diam:  3.10 cm MITRAL VALVE MV Area (PHT): 3.30 cm    SHUNTS MV Decel Time: 230 msec    Systemic Diam: 2.00 cm MV E velocity: 81.50 cm/s MV A velocity: 84.80 cm/s MV E/A ratio:  0.96 Olga Millers MD Electronically signed by Olga Millers MD Signature Date/Time: 05/23/2021/11:48:54 AM    Final    [place] EKG SITE RITE  Result  Date: 05/23/2021 If Site Rite image not attached, placement could not be confirmed due to current cardiac rhythm.      Subjective: NO REPORTS OF OVENRIGHT AGITATION PT AGAIN RESTING COMFORTABLY IN BED TODAY  Objective: Vitals:   06/20/21 0410 06/20/21 0420 06/20/21 0830 06/20/21 1129  BP: 120/68  120/83 121/87  Pulse: 88  84 84  Resp: 18  18 18   Temp: 97.9 F (36.6 C)  98.3 F (36.8 C) 98.2 F (36.8 C)  TempSrc: Oral     SpO2: 100%  100%   Weight:  60.7 kg    Height:        Intake/Output Summary (Last 24 hours) at 06/20/2021 1243 Last data filed at 06/20/2021 0500 Gross per 24 hour  Intake --  Output 1650 ml  Net -1650 ml   Filed Weights   06/17/21 0500 06/18/21 0500 06/20/21 0420  Weight: 57 kg 60.4 kg 60.7 kg    Examination:  General exam: MIN INTERACTIVE-UNCHANGED FROM PRIOR-NO EVID OF AGITATION Respiratory system: Clear to auscultation. Respiratory effort normal. Cardiovascular system: S1 & S2 heard, RRR. No JVD, murmurs, rubs, gallops or clicks. No pedal edema. Gastrointestinal system: Abdomen is nondistended, soft . Quiet bowel sounds heard. Central nervous system: min interactive reflexes in place Extremities:WWP, NO NEW CONTRACTURES Skin: No rashes, lesions or ulcers Psychiatry: Judgement and insight SEVERELY IMPAIRED, Mood & affect FLAT     Data Reviewed: I have personally reviewed following labs and imaging  studies  CBC: Recent Labs  Lab 06/16/21 0340  WBC 10.2  HGB 13.4  HCT 39.6  MCV 98.5  PLT 275   Basic Metabolic Panel: Recent Labs  Lab 06/15/21 0747 06/17/21 0251  NA 144 143  K 5.0 3.5  CL 106 109  CO2 30 28  GLUCOSE 111* 124*  BUN 16 20  CREATININE 0.71 0.56*  CALCIUM 9.8 9.5   GFR: Estimated Creatinine Clearance: 85.4 mL/min (A) (by C-G formula based on SCr of 0.56 mg/dL (L)). Liver Function Tests: No results for input(s): AST, ALT, ALKPHOS, BILITOT, PROT, ALBUMIN in the last 168 hours. No results for input(s): LIPASE, AMYLASE in the last 168 hours. No results for input(s): AMMONIA in the last 168 hours. Coagulation Profile: No results for input(s): INR, PROTIME in the last 168 hours. Cardiac Enzymes: No results for input(s): CKTOTAL, CKMB, CKMBINDEX, TROPONINI in the last 168 hours. BNP (last 3 results) No results for input(s): PROBNP in the last 8760 hours. HbA1C: No results for input(s): HGBA1C in the last 72 hours. CBG: Recent Labs  Lab 06/19/21 2058 06/20/21 0013 06/20/21 0406 06/20/21 0828 06/20/21 1130  GLUCAP 114* 120* 118* 111* 112*   Lipid Profile: No results for input(s): CHOL, HDL, LDLCALC, TRIG, CHOLHDL, LDLDIRECT in the last 72 hours. Thyroid Function Tests: No results for input(s): TSH, T4TOTAL, FREET4, T3FREE, THYROIDAB in the last 72 hours. Anemia Panel: No results for input(s): VITAMINB12, FOLATE, FERRITIN, TIBC, IRON, RETICCTPCT in the last 72 hours. Sepsis Labs: No results for input(s): PROCALCITON, LATICACIDVEN in the last 168 hours.  No results found for this or any previous visit (from the past 240 hour(s)).       Radiology Studies: No results found.      Scheduled Meds:  chlorhexidine  15 mL Mouth Rinse BID   Chlorhexidine Gluconate Cloth  6 each Topical Daily   feeding supplement (PROSource TF)  45 mL Per Tube BID   fiber  1 packet Per Tube BID   free water  100 mL Per  Tube Q4H   heparin injection  (subcutaneous)  5,000 Units Subcutaneous Q8H   mouth rinse  15 mL Mouth Rinse q12n4p   sodium chloride  1 g Per Tube BID WC   Continuous Infusions:  feeding supplement (JEVITY 1.2 CAL) 1,560 mL (06/20/21 1234)     LOS: 32 days    Time spent: 35 MIN    Burke Keels, MD [facility]  If 7PM-7AM, please contact night-coverage  06/20/2021, 12:43 PM

## 2021-06-20 NOTE — Plan of Care (Signed)
  Problem: Elimination: Goal: Will not experience complications related to urinary retention Outcome: Progressing   Problem: Pain Managment: Goal: General experience of comfort will improve Outcome: Progressing   Problem: Safety: Goal: Ability to remain free from injury will improve Outcome: Progressing   Problem: Skin Integrity: Goal: Risk for impaired skin integrity will decrease Outcome: Progressing   Problem: Education: Goal: Individualized Educational Video(s) Outcome: Progressing

## 2021-06-21 LAB — GLUCOSE, CAPILLARY
Glucose-Capillary: 110 mg/dL — ABNORMAL HIGH (ref 70–99)
Glucose-Capillary: 112 mg/dL — ABNORMAL HIGH (ref 70–99)
Glucose-Capillary: 116 mg/dL — ABNORMAL HIGH (ref 70–99)
Glucose-Capillary: 117 mg/dL — ABNORMAL HIGH (ref 70–99)
Glucose-Capillary: 131 mg/dL — ABNORMAL HIGH (ref 70–99)
Glucose-Capillary: 143 mg/dL — ABNORMAL HIGH (ref 70–99)

## 2021-06-21 NOTE — Progress Notes (Addendum)
12:30pm: CSW sent clinicals via fax to the [facility] [place] for review.  10:30am: CSW sent patient's clinical information to [facility] for review.  Edwin Dada, MSW, LCSW Transitions of Care  Clinical Social Worker II [phone]

## 2021-06-21 NOTE — Progress Notes (Signed)
Physical Therapy Treatment Patient Details Name: [name] [name] MRN: [id number] DOB: Sep 13, 1961 Today's Date: 06/21/2021    History of Present Illness 60 y.o. M who was admitted to ICU 05/19/21 with IVH with large acute SAH now s/p ventriculostomy. 5/26 coil embolization. 5/29 worsening neurological exam consistent with vasospasm. VP shunt placement 6/14. Significant PMH: rotator cuff tear, tobacco, marijuana and ETOH use, remote hx crack cocaine use.    PT Comments    Pt sleeping on arrival. Difficult to arouse. Only waking up for brief periods of time. Bed placed in chair position with attempts at UE/LE exercises. Minimal participation from pt as he remained very sleepy. Eyes closed. Unable to attempt EOB or OOB. Bed returned to original position. Pillow placed under R shld/trunk/hip for modified sidelying and pressure relief.    Follow Up Recommendations  SNF     Equipment Recommendations  Wheelchair (measurements PT);Wheelchair cushion (measurements PT);Hospital bed;3in1 (PT);Other (comment)    Recommendations for Other Services       Precautions / Restrictions Precautions Precautions: Fall;Other (comment) Precaution Comments: safety mitts, tube feedings    Mobility  Bed Mobility Overal bed mobility: Needs Assistance Bed Mobility: Rolling Rolling: Mod assist         General bed mobility comments: Pt will assist with rolling R/L after initated by therapist.    Transfers                    Ambulation/Gait                 Stairs             Wheelchair Mobility    Modified Rankin (Stroke Patients Only)       Balance Overall balance assessment: Needs assistance                                          Cognition Arousal/Alertness: Lethargic;Suspect due to medications Behavior During Therapy: Flat affect Overall Cognitive Status: Difficult to assess                                        Exercises  Other Exercises Other Exercises: AAROM BUE/LE    General Comments General comments (skin integrity, edema, etc.): Bed placed in chair position for exercises and attempt to increase arousal.      Pertinent Vitals/Pain Pain Assessment: Faces Faces Pain Scale: No hurt    Home Living                      Prior Function            PT Goals (current goals can now be found in the care plan section) Acute Rehab PT Goals Patient Stated Goal: unable to state Progress towards PT goals: Not progressing toward goals - comment (lethargy)    Frequency    Min 2X/week      PT Plan Frequency needs to be updated    Co-evaluation              AM-PAC PT "6 Clicks" Mobility   Outcome Measure  Help needed turning from your back to your side while in a flat bed without using bedrails?: A Lot Help needed moving from lying on your back to sitting on the side of a flat  bed without using bedrails?: A Lot Help needed moving to and from a bed to a chair (including a wheelchair)?: Total Help needed standing up from a chair using your arms (e.g., wheelchair or bedside chair)?: Total Help needed to walk in hospital room?: Total Help needed climbing 3-5 steps with a railing? : Total 6 Click Score: 8    End of Session   Activity Tolerance: Patient limited by lethargy Patient left: in bed;with call bell/phone within reach;with bed alarm set;with family/visitor present Nurse Communication: Mobility status PT Visit Diagnosis: Unsteadiness on feet (R26.81);Muscle weakness (generalized) (M62.81);Difficulty in walking, not elsewhere classified (R26.2);Other abnormalities of gait and mobility (R26.89)     Time: 6861-6837 PT Time Calculation (min) (ACUTE ONLY): 12 min  Charges:  $Therapeutic Exercise: 8-22 mins                     Aida Raider, PT  Office # [phone] Pager [phone]    Ilda Foil 06/21/2021, 9:52 AM

## 2021-06-21 NOTE — Progress Notes (Signed)
PROGRESS NOTE    [name] [name]  XJO:[id number] DOB: 03-Feb-1961 DOA: 05/19/2021 PCP: Pcp, No   Brief Narrative:  [name] [name] is an 60 y.o. male past medical history past medical history of alcohol abuse and drug abuse who suffered a subarachnoid hemorrhage status post coiling of the anterior communicating aneurysm, underwent VP shunting on 06/09/2019   Significant Events: 5/25 Admitted with [facility] secondary to ruptured aneurysm, ventriculostomy preformed in ED, Ancef started 5/26 Coil Embolization 5/29 worsening neurological exam consistent with vasospasm, hemodynamic augmentation initiated. 6/1 worsening hyponatremia: Na of 120.  Patient started on salt tablets 1 g twice daily and fludrocortisone 0.1 mg. 6/2 BP goal reduced 160 -180.  Salt Tablet increased to 1 g tid. Fludrocortisone increased 0.2 mg. 6/8 Ongoing agitation, intermittently following commands. CT Head with worsening hydrocephalus despite EVD, open to 10 per NSGY. 6/9 EVD remains in place, repeat head CT as below 6/10-12, no significant neuro changes, intermittently agitated.  Ongoing EVD, plans for VP shunt next week 6/13 no neuro changes, remains intermittently restless 6/14 went for VP shunt, no neuro changes, afebrile 6/21 status post PEG tube placement nutrition started 24 hours postplacement.   Awaiting skilled nursing facility placement   Assessment & Plan:   Active Problems:   Cerebral salt-wasting   Aneurysmal subarachnoid hemorrhage (HCC)   Vasospasm of cerebral artery   Diarrheal stools  Acute metabolic encephalopathy subarachnoid hemorrhage secondary to aneurysm rupture: Pt with some confusion overnight, required prn haldol, stable on my eval this AM Obstructive hydrocephalus status post EVD on 06/08/2021. Not able to tolerate his orals. PEG tube placement on 06/15/2021. Continue aspiration precaution and seizure precautions.  He is currently on Keppra for seizure prophylaxis. Physical therapy  evaluated the patient, recommended skilled nursing facility. DIFFICULT PLACEMENT   Hyponatremia suspect cerebral salt wasting syndrome: Resolved discontinued salt tablets.  Diarrhea: Continue loperamide.  Resolved  Physical deconditioning in the setting of subarachnoid hemorrhage: Will need to go to skilled nursing facility.   Nutrition: Continue nutrition per core track. IR placed PEG on 06/16/2021. BMP 6/25  Leukocytosis: Resolved, Likely stress margination has remained afebrile,  No indication for antibiotics.   DVT prophylaxis: SCD/Compression stockings  Code Status: FULL    Code Status Orders  (From admission, onward)           Start     Ordered   05/19/21 2022  Full code  Continuous        05/19/21 2022           Code Status History     Date Active Date Inactive Code Status Order ID Comments User Context   10/31/2016 2152 11/01/2016 1945 Full Code [id number]  Clifton Custard Inpatient   10/31/2016 1308 10/31/2016 2047 Full Code [id number]  Dionne Ano ED   06/13/2013 1417 06/13/2013 2022 Full Code [id number]  Emilia Beck, PA-C ED      Family Communication: SPOKE WITH MOTHER AT BEDSIDE Disposition Plan:   SNF Consults called: None Admission status: Inpatient Status is: Inpatient   Remains inpatient appropriate because:Hemodynamically unstable   Dispo: The patient is from: Home              Anticipated d/c is to: Skilled nursing facility              Patient currently is not medically stable to d/c.              Difficult to place patient No   Consultants:  NSU, IR  Procedures:  CT ABDOMEN WO CONTRAST  Result Date: 06/14/2021 CLINICAL DATA:  Evaluate anatomy for percutaneous gastrostomy tube placement. History of intracranial aneurysm and hemorrhage. EXAM: CT ABDOMEN WITHOUT CONTRAST TECHNIQUE: Multidetector CT imaging of the abdomen was performed following the standard protocol without IV contrast. COMPARISON:  None.  FINDINGS: Lower chest: Few dependent densities at the right lung base. Otherwise, the lung bases are clear. There appears to be a small hiatal hernia. Hepatobiliary: Normal appearance of the liver and gallbladder. No significant biliary dilatation. Pancreas: Unremarkable. No pancreatic ductal dilatation or surrounding inflammatory changes. Spleen: Normal in size without focal abnormality. Adrenals/Urinary Tract: Normal appearance of the adrenal glands. Normal appearance of both kidneys without stones or hydronephrosis. Stomach/Bowel: Nasogastric tube extends in the stomach and terminates near the duodenal bulb. The stomach is cephalad to the transverse colon. No significant bowel dilatation. No focal bowel inflammation. Vascular/Lymphatic: Atherosclerotic calcifications in the abdominal aorta without aneurysm. No significant lymph node enlargement in the abdomen. Other: VP shunt enters through the anterior right upper abdomen. There is a small amount of gas around the shunt tubing as it enters the abdominal cavity. There is also free air within the anterior upper abdomen likely secondary to the shunt placement. The shunt tubing coils around the right side of the abdomen and terminates in the mid upper abdomen adjacent to the mid transverse colon. No significant free fluid in the abdomen. Areas of subcutaneous edema in the anterior abdomen could be related to injection sites. Musculoskeletal: Sclerosis involving the anterior and mid L2 vertebral body is likely related to degenerative changes. No suspicious bone findings. IMPRESSION: 1. Stomach is located in the anterior upper abdomen and anatomy should be amendable for percutaneous gastrostomy tube placement. 2. Pneumoperitoneum which is likely secondary to recent placement of VP shunt. 3. Probable small hiatal hernia. Electronically Signed   By: Richarda Overlie M.D.   On: 06/14/2021 08:09   CT ANGIO HEAD W OR WO CONTRAST  Result Date: 05/23/2021 CLINICAL DATA:  Neuro  deficit, acute stroke suspected. Aneurysmal subarachnoid hemorrhage with suspected symptomatic vasospasm. EXAM: CT ANGIOGRAPHY HEAD TECHNIQUE: Multidetector CT imaging of the head was performed using the standard protocol during bolus administration of intravenous contrast. Multiplanar CT image reconstructions and MIPs were obtained to evaluate the vascular anatomy. CONTRAST:  51mL OMNIPAQUE IOHEXOL 350 MG/ML SOLN COMPARISON:  CT head May 20, 2021.  CTA May 19, 2021. FINDINGS: CT HEAD Brain: A right frontal ventriculostomy catheter with tip in similar position. Tube there is similar volume of intraventricular hemorrhage i with similar ventriculomegaly. Similar intraparenchymal hemorrhage in the left greater than right inferior frontal lobes, likely related to anterior communicating artery rupture. There is similar surrounding edema and subarachnoid hemorrhage. Additional areas of convexity subarachnoid hemorrhage have slightly decreased. No evidence of interval acute large vascular territory infarct. No progressive mass effect. . Vascular: See below. Skull: No acute fracture. Sinuses: Visualized sinuses are clear. Other: No mastoid effusions. CTA HEAD Anterior circulation: Bilateral ICAs are patent. Similar mild calcific atherosclerosis of the paraclinoid ICAs. Status post anterior communicating artery aneurysm coiling. Streak artifact largely obscures this region, including adjacent distal A1 and proximal A2 ACAs and the paraclinoid ICAs. The visualized MCAs and ACAs are patent. There is new fairly diffuse multifocal moderate irregularity and narrowing of the MCAs and ACAs. Posterior circulation: The intradural vertebral arteries are patent with mild narrowing. Patent basilar artery and bilateral posterior cerebral arteries. Venous sinuses: As permitted by contrast timing, patent. Review of the MIP images confirms  the above findings. IMPRESSION: CT head: 1. Similar intraparenchymal and intraventricular hemorrhage  in this patient with suspected prior anterior communicating artery rupture. No progressive ventriculomegaly or mass effect. Slightly improved convexity subarachnoid hemorrhage. 2. No evidence of acute interval large vascular territory infarct. MRI could provide more sensitive evaluation for acute infarct if clinically indicated. CTA 1. Multifocal moderate irregularity and narrowing of patent MCAs and ACAs bilaterally that is best appreciated on MIP imaging and compatible with vasospasm. Catheter arteriogram could further characterize if clinically indicated. 2. Status post anterior communicating artery aneurysm coiling with streak artifact obscuring this area. Electronically Signed   By: Feliberto Harts MD   On: 05/23/2021 18:13   CT HEAD WO CONTRAST  Addendum Date: 06/02/2021   ADDENDUM REPORT: 06/02/2021 11:46 ADDENDUM: Findings and recommendations discussed with Dr. Merrily Pew via telephone at 11:42 a.m. Electronically Signed   By: Feliberto Harts MD   On: 06/02/2021 11:46   Result Date: 06/02/2021 CLINICAL DATA:  Hydrocephalus. EXAM: CT HEAD WITHOUT CONTRAST TECHNIQUE: Contiguous axial images were obtained from the base of the skull through the vertex without intravenous contrast. COMPARISON:  CT May 23, 2020. FINDINGS: Brain: Significantly increased ventriculomegaly, compatible with worsening hydrocephalus. Right frontal approach ventriculostomy catheter with the tip at the right lateral ventricle near the foramen of Monro, similar to prior. Visualized portions of the shunt catheter appear intact. Small (7 mm) focus of hyperdense hemorrhage in the left pericallosal frontal lobe (series 3, image 19 and series 6, image 38) is concerning for a new/interval acute hemorrhage given no definite hyperdense hemorrhage at this site on the prior, although a direct comparison is limited given evolving blood products. Otherwise, decreased size and density of intraparenchymal hemorrhage in the inferior left frontal lobe  and corpus callosum. Hypodensity in these areas likely represents edema and developing encephalomalacia. Small volume of surrounding subarachnoid hemorrhage, decreased. No evidence of interval acute large vascular territory infarct. Decreased layering intraventricular hemorrhage. Significantly increased size of the lateral and third ventricles, compatible with worsening hydrocephalus. There is rounding of the temporal horns and outward convexity of the third ventricle. Vascular: No hyperdense vessel identified. Sequela of left anterior communicating artery aneurysm coiling. Surrounding streak artifact limits evaluation in this region. Skull: Right frontal burr hole.  No acute fracture. Sinuses/Orbits: Visualized sinuses are clear. Other: No mastoid effusions. IMPRESSION: 1. Significantly increased ventriculomegaly, compatible with worsening hydrocephalus. 2. Small (7 mm) focus of hyperdense hemorrhage in the left pericallosal frontal lobe is concerning for a new/interval site of acute hemorrhage given no definite hyperdense hemorrhage at this site on the prior, although a direct comparison is limited given evolving blood products. Recommend attention on close interval follow-up. 3. Otherwise, improving intraparenchymal, subarachnoid, and intraventricular hemorrhage with edema and suspected developing encephalomalacia in the inferior left frontal lobe and corpus callosum. Electronically Signed: By: Feliberto Harts MD On: 06/02/2021 11:33   IR GASTROSTOMY TUBE MOD SED  Result Date: 06/15/2021 INDICATION: Dysphagia second to subarachnoid hemorrhage post coiling of A-comm aneurysm and VP shunt placement. Please perform percutaneous gastrostomy tube placement for enteric nutrition supplementation purposes. EXAM: PULL TROUGH GASTROSTOMY TUBE PLACEMENT COMPARISON:  None. MEDICATIONS: Ancef 2 gm IV; Antibiotics were administered within 1 hour of the procedure. Glucagon 1 mg IV CONTRAST:  15 cc of Omnipaque 300  administered into the gastric lumen. ANESTHESIA/SEDATION: Moderate (conscious) sedation was employed during this procedure. A total of Versed 1 mg and Fentanyl 50 mcg was administered intravenously. Moderate Sedation Time: 33 minutes. The patient's level of consciousness  and vital signs were monitored continuously by radiology nursing throughout the procedure under my direct supervision. FLUOROSCOPY TIME:  6 minutes, 48 seconds (46 mGy) COMPLICATIONS: None immediate. PROCEDURE: Informed written consent was obtained from the patient's family following explanation of the procedure, risks, benefits and alternatives. A time out was performed prior to the initiation of the procedure. Ultrasound scanning was performed to demarcate the edge of the left lobe of the liver. Maximal barrier sterile technique utilized including caps, mask, sterile gowns, sterile gloves, large sterile drape, hand hygiene and Betadine prep. The left upper quadrant was sterilely prepped and draped. An oral gastric catheter was inserted into the stomach under fluoroscopy. The existing nasogastric feeding tube was removed. The left costal margin and air opacified transverse colon were identified and avoided. Air was injected into the stomach for insufflation and visualization under fluoroscopy. Under sterile conditions a 17 gauge trocar needle was utilized to access the stomach percutaneously beneath the left subcostal margin after the overlying soft tissues were anesthetized with 1% Lidocaine with epinephrine. Special attention was made to avoid the tip of the ventriculoperitoneal catheter tubing. Needle position was confirmed within the stomach with aspiration of air and injection of small amount of contrast. A single T tack was deployed for gastropexy. Over an Amplatz guide wire, a 9-French sheath was inserted into the stomach. A snare device was utilized to capture the oral gastric catheter. The snare device was pulled retrograde from the  stomach up the esophagus and out the oropharynx. The 20-French pull-through gastrostomy was connected to the snare device and pulled antegrade through the oropharynx down the esophagus into the stomach and then through the percutaneous tract external to the patient. The gastrostomy was assembled externally. Contrast injection confirms appropriate positioning within the stomach. Several spot radiographic images were obtained in various obliquities for documentation. Dressings were applied. The patient tolerated procedure well without immediate post procedural complication. FINDINGS: After successful fluoroscopic guided placement, the gastrostomy tube is appropriately positioned with internal disc positioned against the inner ventral wall of the gastric lumen. IMPRESSION: Successful fluoroscopic insertion of a 20-French pull-through gastrostomy tube. The gastrostomy may be used immediately for medication administration and in 24 hrs for the initiation of feeds. Electronically Signed   By: Simonne Come M.D.   On: 06/15/2021 16:38   DG CHEST PORT 1 VIEW  Result Date: 05/23/2021 CLINICAL DATA:  Subarachnoid hemorrhage EXAM: PORTABLE CHEST 1 VIEW COMPARISON:  May 19, 2021 FINDINGS: The cardiomediastinal silhouette is unchanged in contour.Enteric tube tip projects over the proximal duodenum. No pleural effusion. No pneumothorax. LEFT basilar linear opacity. Visualized abdomen is unremarkable. Multilevel degenerative changes of the thoracic spine. IMPRESSION: LEFT basilar linear opacity with differential considerations including infection or atelectasis. Electronically Signed   By: Meda Klinefelter MD   On: 05/23/2021 11:38   ECHOCARDIOGRAM COMPLETE  Result Date: 05/26/2021    ECHOCARDIOGRAM REPORT   Patient Name:   HOWELL GROESBECK Date of Exam: 05/26/2021 Medical Rec #:  [id number]        Height:       69.0 in Accession #:    [id number]       Weight:       136.5 lb Date of Birth:  27-Feb-1961        BSA:          1.756  m Patient Age:    60 years         BP:  184/87 mmHg Patient Gender: M                HR:           91 bpm. Exam Location:  Inpatient Procedure: 2D Echo, 3D Echo, Color Doppler and Cardiac Doppler Indications:    I42.9 Cardiomyopathy (unspecified)  History:        Patient has prior history of Echocardiogram examinations, most                 recent 05/23/2021.  Sonographer:    Irving Burton Senior RDCS Referring Phys: [id number] RAVI AGARWALA IMPRESSIONS  1. Left ventricular ejection fraction by 3D volume is 63 %. The left ventricle has normal function. The left ventricle has no regional wall motion abnormalities. Left ventricular diastolic parameters are consistent with Grade I diastolic dysfunction (impaired relaxation).  2. Right ventricular systolic function is normal. The right ventricular size is normal. Tricuspid regurgitation signal is inadequate for assessing PA pressure.  3. The mitral valve is normal in structure. Trivial mitral valve regurgitation. No evidence of mitral stenosis.  4. The aortic valve is tricuspid. Aortic valve regurgitation is not visualized. No aortic stenosis is present.  5. The inferior vena cava is normal in size with greater than 50% respiratory variability, suggesting right atrial pressure of 3 mmHg. FINDINGS  Left Ventricle: Left ventricular ejection fraction by 3D volume is 63 %. The left ventricle has normal function. The left ventricle has no regional wall motion abnormalities. The left ventricular internal cavity size was normal in size. There is no left  ventricular hypertrophy. Left ventricular diastolic parameters are consistent with Grade I diastolic dysfunction (impaired relaxation). Right Ventricle: The right ventricular size is normal. No increase in right ventricular wall thickness. Right ventricular systolic function is normal. Tricuspid regurgitation signal is inadequate for assessing PA pressure. Left Atrium: Left atrial size was normal in size. Right Atrium: Right  atrial size was normal in size. Pericardium: There is no evidence of pericardial effusion. Mitral Valve: The mitral valve is normal in structure. Trivial mitral valve regurgitation. No evidence of mitral valve stenosis. Tricuspid Valve: The tricuspid valve is normal in structure. Tricuspid valve regurgitation is not demonstrated. No evidence of tricuspid stenosis. Aortic Valve: The aortic valve is tricuspid. Aortic valve regurgitation is not visualized. No aortic stenosis is present. Pulmonic Valve: The pulmonic valve was normal in structure. Pulmonic valve regurgitation is trivial. No evidence of pulmonic stenosis. Aorta: The aortic root is normal in size and structure. Venous: The inferior vena cava is normal in size with greater than 50% respiratory variability, suggesting right atrial pressure of 3 mmHg. IAS/Shunts: No atrial level shunt detected by color flow Doppler.  LEFT VENTRICLE PLAX 2D LVIDd:         4.30 cm         Diastology LVIDs:         2.90 cm         LV e' medial:    7.62 cm/s LV PW:         0.90 cm         LV E/e' medial:  9.3 LV IVS:        0.60 cm         LV e' lateral:   10.00 cm/s LVOT diam:     2.00 cm         LV E/e' lateral: 7.1 LV SV:         70 LV SV Index:   40 LVOT  Area:     3.14 cm        3D Volume EF                                LV 3D EF:    Left                                             ventricular                                             ejection                                             fraction by                                             3D volume                                             is 63 %.                                 3D Volume EF:                                3D EF:        63 %                                LV EDV:       124 ml                                LV ESV:       46 ml                                LV SV:        77 ml RIGHT VENTRICLE RV S prime:     7.72 cm/s TAPSE (M-mode): 1.8 cm LEFT ATRIUM             Index       RIGHT ATRIUM            Index LA diam:        3.90 cm 2.22 cm/m  RA Area:     11.20 cm LA Vol (A2C):   38.0 ml 21.64 ml/m RA Volume:   22.40 ml  12.75 ml/m LA Vol (A4C):   40.2 ml 22.89 ml/m LA Biplane Vol: 39.7 ml 22.61 ml/m  AORTIC VALVE LVOT Vmax:   140.00 cm/s  LVOT Vmean:  95.000 cm/s LVOT VTI:    0.223 m  AORTA Ao Root diam: 3.30 cm Ao Asc diam:  3.10 cm MITRAL VALVE MV Area (PHT): 3.21 cm    SHUNTS MV Decel Time: 236 msec    Systemic VTI:  0.22 m MV E velocity: 71.00 cm/s  Systemic Diam: 2.00 cm MV A velocity: 94.00 cm/s MV E/A ratio:  0.76 Weston Brass MD Electronically signed by Weston Brass MD Signature Date/Time: 05/26/2021/9:20:19 PM    Final    VAS [place] TRANSCRANIAL DOPPLER  Result Date: 06/04/2021  Transcranial Doppler Patient Name:  KENYAN KARNES  Date of Exam:   06/02/2021 Medical Rec #: [id number]         Accession #:    [id number] Date of Birth: [birth year]         Patient Gender: M Patient Age:   60Y Exam Location:  [facility] Procedure:      VAS [place] TRANSCRANIAL DOPPLER Referring Phys: [id number] Donalee Citrin --------------------------------------------------------------------------------  Indications: Subarachnoid hemorrhage. Limitations: Patient unable to remain stationary for duration of examination. Comparison Study: Most recent prior study 05-31-2021 Performing Technologist: Jean Rosenthal RDMS,RVT  Examination Guidelines: A complete evaluation includes B-mode imaging, spectral Doppler, color Doppler, and power Doppler as needed of all accessible portions of each vessel. Bilateral testing is considered an integral part of a complete examination. Limited examinations for reoccurring indications may be performed as noted.  +----------+-------------+----------+-----------+-------+ RIGHT TCD Right VM (cm)Depth (cm)PulsatilityComment +----------+-------------+----------+-----------+-------+ MCA          119.00       5.50      0.72            +----------+-------------+----------+-----------+-------+  ACA          -31.00                 0.68            +----------+-------------+----------+-----------+-------+ Term ICA     111.00       5.60      0.67            +----------+-------------+----------+-----------+-------+ PCA           38.00                 0.67            +----------+-------------+----------+-----------+-------+ Opthalmic     29.00                 1.24            +----------+-------------+----------+-----------+-------+ ICA siphon    29.00                 1.17            +----------+-------------+----------+-----------+-------+ Vertebral    -20.00                 0.79            +----------+-------------+----------+-----------+-------+ Distal ICA    23.00                 0.82            +----------+-------------+----------+-----------+-------+  +----------+------------+----------+-----------+-------+ LEFT TCD  Left VM (cm)Depth (cm)PulsatilityComment +----------+------------+----------+-----------+-------+ MCA          130.00      4.10      0.86            +----------+------------+----------+-----------+-------+ ACA          -26.00  0.32            +----------+------------+----------+-----------+-------+ Term ICA     36.00       5.70      0.79            +----------+------------+----------+-----------+-------+ PCA          25.00                 1.16            +----------+------------+----------+-----------+-------+ Opthalmic    27.00                 1.01            +----------+------------+----------+-----------+-------+ ICA siphon   34.00                 1.29            +----------+------------+----------+-----------+-------+ Vertebral    -22.00                0.61            +----------+------------+----------+-----------+-------+ Distal ICA   20.00                 0.86            +----------+------------+----------+-----------+-------+  +------------+-------+------------------+              VM cm/s     Comment       +------------+-------+------------------+ Prox Basilar       Unable to insonate +------------+-------+------------------+ +----------------------+----+ Right Lindegaard Ratio5.17 +----------------------+----+ +---------------------+----+ Left Lindegaard Ratio6.50 +---------------------+----+  Summary:  Elevated mean flow velocities in bilateral middle cerebral and terminal right internal carotid arteries suggestive of mild vasospasm. *See table(s) above for TCD measurements and observations.  Diagnosing physician: Delia Heady MD Electronically signed by Delia Heady MD on 06/04/2021 at 12:01:08 PM.    Final    VAS [place] TRANSCRANIAL DOPPLER  Result Date: 05/31/2021  Transcranial Doppler Patient Name:  KIMBALL APPLEBY  Date of Exam:   05/31/2021 Medical Rec #: [id number]         Accession #:    [id number] Date of Birth: 10/08/1961         Patient Gender: M Patient Age:   60Y Exam Location:  [facility] Procedure:      VAS [place] TRANSCRANIAL DOPPLER Referring Phys: [id number] Donalee Citrin --------------------------------------------------------------------------------  Indications: Subarachnoid hemorrhage. Comparison Study: 05/28/21- TCD Performing Technologist: Gertie Fey MHA, RDMS, RVT, RDCS  Examination Guidelines: A complete evaluation includes B-mode imaging, spectral Doppler, color Doppler, and power Doppler as needed of all accessible portions of each vessel. Bilateral testing is considered an integral part of a complete examination. Limited examinations for reoccurring indications may be performed as noted.  +----------+-------------+----------+-----------+------------------+ RIGHT TCD Right VM (cm)Depth (cm)Pulsatility     Comment       +----------+-------------+----------+-----------+------------------+ MCA           92.00       4.70      0.96                       +----------+-------------+----------+-----------+------------------+ ACA          -26.00                  0.75                       +----------+-------------+----------+-----------+------------------+ Term ICA      21.00  1.26                       +----------+-------------+----------+-----------+------------------+ PCA           18.00                 1.21                       +----------+-------------+----------+-----------+------------------+ Opthalmic     20.00                 1.24                       +----------+-------------+----------+-----------+------------------+ ICA siphon                                  Unable to insonate +----------+-------------+----------+-----------+------------------+ Vertebral                                   Unable to insonate +----------+-------------+----------+-----------+------------------+ Distal ICA    36.00                                            +----------+-------------+----------+-----------+------------------+  +----------+------------+----------+-----------+------------------+ LEFT TCD  Left VM (cm)Depth (cm)Pulsatility     Comment       +----------+------------+----------+-----------+------------------+ MCA          93.00                 0.99                       +----------+------------+----------+-----------+------------------+ ACA                                        Unable to insonate +----------+------------+----------+-----------+------------------+ Term ICA     46.00                 0.84                       +----------+------------+----------+-----------+------------------+ PCA                                        Unable to insonate +----------+------------+----------+-----------+------------------+ Opthalmic    24.00                 1.24                       +----------+------------+----------+-----------+------------------+ ICA siphon                                 Unable to insonate  +----------+------------+----------+-----------+------------------+ Vertebral                                  Unable to insonate +----------+------------+----------+-----------+------------------+ Distal ICA   28.00                                            +----------+------------+----------+-----------+------------------+  +------------+-------+------------------+  VM cm/s     Comment       +------------+-------+------------------+ Prox Basilar       Unable to insonate +------------+-------+------------------+ +----------------------+----+ Right Lindegaard Ratio2.56 +----------------------+----+ +---------------------+----+ Left Lindegaard Ratio3.32 +---------------------+----+  Summary:  Mildly elevared bilateral midle cerebral artery mean flow velocities of unclear significance.Not all vessels could be studied due to technical difficulties. *See table(s) above for TCD measurements and observations.  Diagnosing physician: Delia Heady MD Electronically signed by Delia Heady MD on 05/31/2021 at 4:39:43 PM.    Final    VAS [place] TRANSCRANIAL DOPPLER  Result Date: 05/28/2021  Transcranial Doppler Patient Name:  CAMILLE THAU  Date of Exam:   05/28/2021 Medical Rec #: [id number]         Accession #:    [id number] Date of Birth: 09-23-1961         Patient Gender: M Patient Age:   60Y Exam Location:  [facility] Procedure:      VAS [place] TRANSCRANIAL DOPPLER Referring Phys: [id number] Donalee Citrin --------------------------------------------------------------------------------  Indications: Subarachnoid hemorrhage. 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 Limitations: Patient movement throughout the exam Limitations for diagnostic windows: Unable to insonate right transtemporal window. Unable to insonate left transtemporal window. Unable to insonate occipital window. Performing Technologist: Marilynne Halsted RDMS, RVT  Examination Guidelines: A complete  evaluation includes B-mode imaging, spectral Doppler, color Doppler, and power Doppler as needed of all accessible portions of each vessel. Bilateral testing is considered an integral part of a complete examination. Limited examinations for reoccurring indications may be performed as noted.  +----------+-------------+----------+-----------+-------------+ RIGHT TCD Right VM (cm)Depth (cm)Pulsatility   Comment    +----------+-------------+----------+-----------+-------------+ MCA          155.00       5.60      0.71                  +----------+-------------+----------+-----------+-------------+ ACA                                         not insonated +----------+-------------+----------+-----------+-------------+ Term ICA                                    not insonated +----------+-------------+----------+-----------+-------------+ PCA           30.00                 0.70                  +----------+-------------+----------+-----------+-------------+ Opthalmic                                   not insonated +----------+-------------+----------+-----------+-------------+ ICA siphon                                  not insonated +----------+-------------+----------+-----------+-------------+ Vertebral                                   not insonated +----------+-------------+----------+-----------+-------------+ Distal ICA  not insonated +----------+-------------+----------+-----------+-------------+  +----------+------------+----------+-----------+-------------+ LEFT TCD  Left VM (cm)Depth (cm)Pulsatility   Comment    +----------+------------+----------+-----------+-------------+ MCA          166.00      4.20      0.81                  +----------+------------+----------+-----------+-------------+ ACA                                        not insonated  +----------+------------+----------+-----------+-------------+ Term ICA                                   not insonated +----------+------------+----------+-----------+-------------+ PCA          54.00                 0.83                  +----------+------------+----------+-----------+-------------+ Opthalmic                                  not insonated +----------+------------+----------+-----------+-------------+ ICA siphon                                 not insonated +----------+------------+----------+-----------+-------------+ Vertebral                                  not insonated +----------+------------+----------+-----------+-------------+ Distal ICA                                 not insonated +----------+------------+----------+-----------+-------------+  +------------+-------+-------------+             VM cm/s   Comment    +------------+-------+-------------+ Prox Basilar       not insonated +------------+-------+-------------+ Dist Basilar       not insonated +------------+-------+-------------+ Summary:  patient movement limited today's study. only b/l MCA and PCA were able to recorded. with this limited data, still has elevated b/l MCA MVFs, not significantly changed from prior, still suggesting moderate vasospasm b/l MCAs. clinical correlation recommended. *See table(s) above for TCD measurements and observations.  Diagnosing physician: Marvel Plan MD Electronically signed by Marvel Plan MD on 05/28/2021 at 2:46:59 PM.    Final    VAS [place] TRANSCRANIAL DOPPLER  Result Date: 05/28/2021  Transcranial Doppler Patient Name:  XZANDER GILHAM  Date of Exam:   05/26/2021 Medical Rec #: [id number]         Accession #:    [id number] Date of Birth: December 27, 1960         Patient Gender: M Patient Age:   60Y Exam Location:  [facility] Procedure:      VAS [place] TRANSCRANIAL DOPPLER Referring Phys: [id number] Donalee Citrin  --------------------------------------------------------------------------------  Indications: Subarachnoid hemorrhage. Limitations: Patient position/ lines/ drains Comparison Study: 05/24/21 Performing Technologist: Jeb Levering RDMS, RVT  Examination Guidelines: A complete evaluation includes B-mode imaging, spectral Doppler, color Doppler, and power Doppler as needed of all accessible portions of each vessel. Bilateral testing is considered an integral part of a complete examination. Limited examinations for reoccurring indications may be  performed as noted.  +----------+-------------+----------+-----------+--------------+ RIGHT TCD Right VM (cm)Depth (cm)Pulsatility   Comment     +----------+-------------+----------+-----------+--------------+ MCA          128.00                 0.84                   +----------+-------------+----------+-----------+--------------+ ACA          -144.00                0.67                   +----------+-------------+----------+-----------+--------------+ Term ICA      67.00                 0.98                   +----------+-------------+----------+-----------+--------------+ PCA           29.00                 0.96                   +----------+-------------+----------+-----------+--------------+ Opthalmic     22.00                 0.95                   +----------+-------------+----------+-----------+--------------+ ICA siphon                                  not visualized +----------+-------------+----------+-----------+--------------+ Vertebral    -52.00                                        +----------+-------------+----------+-----------+--------------+ Distal ICA    38.00                                        +----------+-------------+----------+-----------+--------------+  +----------+------------+----------+-----------+--------------+ LEFT TCD  Left VM (cm)Depth (cm)Pulsatility   Comment      +----------+------------+----------+-----------+--------------+ MCA          156.00                0.76                   +----------+------------+----------+-----------+--------------+ ACA          -55.00                0.76                   +----------+------------+----------+-----------+--------------+ Term ICA     38.00                 0.95                   +----------+------------+----------+-----------+--------------+ PCA          30.00                 0.96                   +----------+------------+----------+-----------+--------------+ Opthalmic    20.00                 1.73                   +----------+------------+----------+-----------+--------------+  ICA siphon                                 not visualized +----------+------------+----------+-----------+--------------+ Vertebral    -53.00                                       +----------+------------+----------+-----------+--------------+ Distal ICA   33.00                                        +----------+------------+----------+-----------+--------------+  +------------+-------+-------+             VM cm/sComment +------------+-------+-------+ Prox Basilar-58.00         +------------+-------+-------+ Dist Basilar-47.00         +------------+-------+-------+ +----------------------+----+ Right Lindegaard Ratio3.37 +----------------------+----+ +---------------------+----+ Left Lindegaard Ratio4.73 +---------------------+----+  Summary:  elevated b/l MCA MFVs as well as b/l Lindegaard ratios no significant or slightly improved from prior, still suggesting mild to moderate vasospasm more on the left. right ACA MFV also elevated this time which was not present on prior study. However, left  ACA elevated MFV seems resolved. continue monitoring warranted. clinical correlation recommended. *See table(s) above for TCD measurements and observations.  Diagnosing physician: Marvel Plan MD  Electronically signed by Marvel Plan MD on 05/28/2021 at 2:45:06 PM.    Final    VAS [place] TRANSCRANIAL DOPPLER  Result Date: 05/28/2021  Transcranial Doppler Patient Name:  LILLIAN TIGGES  Date of Exam:   05/24/2021 Medical Rec #: [id number]         Accession #:    [id number] Date of Birth: 27-Dec-1960         Patient Gender: M Patient Age:   60Y Exam Location:  [facility] Procedure:      VAS [place] TRANSCRANIAL DOPPLER Referring Phys: [id number] Donalee Citrin --------------------------------------------------------------------------------  Indications: Subarachnoid hemorrhage. Limitations: Patient unable to cooperate; combative. Limitations for diagnostic windows: Unable to insonate occipital window. Comparison Study: 05/21/21- TCD Performing Technologist: Gertie Fey MHA, RDMS, RVT, RDCS  Examination Guidelines: A complete evaluation includes B-mode imaging, spectral Doppler, color Doppler, and power Doppler as needed of all accessible portions of each vessel. Bilateral testing is considered an integral part of a complete examination. Limited examinations for reoccurring indications may be performed as noted.  +----------+-------------+----------+-----------+------------------+ RIGHT TCD Right VM (cm)Depth (cm)Pulsatility     Comment       +----------+-------------+----------+-----------+------------------+ MCA          135.00       5.40      0.94                       +----------+-------------+----------+-----------+------------------+ ACA          -38.00                 1.13                       +----------+-------------+----------+-----------+------------------+ Term ICA      21.00                 1.20                       +----------+-------------+----------+-----------+------------------+ PCA  46.00                 0.90                       +----------+-------------+----------+-----------+------------------+ Opthalmic     17.00                 1.27                        +----------+-------------+----------+-----------+------------------+ ICA siphon    21.00                  1.2                       +----------+-------------+----------+-----------+------------------+ Vertebral                                   Unable to insonate +----------+-------------+----------+-----------+------------------+ Distal ICA    29.00                                            +----------+-------------+----------+-----------+------------------+  +----------+------------+----------+-----------+------------------+ LEFT TCD  Left VM (cm)Depth (cm)Pulsatility     Comment       +----------+------------+----------+-----------+------------------+ MCA          142.00      5.40      0.79                       +----------+------------+----------+-----------+------------------+ ACA         -148.00                0.53                       +----------+------------+----------+-----------+------------------+ Term ICA     99.00                 0.84                       +----------+------------+----------+-----------+------------------+ PCA          20.00                 1.00                       +----------+------------+----------+-----------+------------------+ Opthalmic    25.00                 1.37                       +----------+------------+----------+-----------+------------------+ ICA siphon   47.00                 1.03                       +----------+------------+----------+-----------+------------------+ Vertebral                                  Unable to insonate +----------+------------+----------+-----------+------------------+ Distal ICA   32.00                                            +----------+------------+----------+-----------+------------------+  +------------+-------+------------------+  VM cm/s     Comment       +------------+-------+------------------+ Prox Basilar       Unable to  insonate +------------+-------+------------------+ Dist Basilar       Unable to insonate +------------+-------+------------------+ +----------------------+----+ Right Lindegaard Ratio4.66 +----------------------+----+ +---------------------+----+ Left Lindegaard Ratio4.44 +---------------------+----+  Summary:  elevated MFVs at b/l MCAs, left ACA and terminal ICA suggesting b/l mild to moderate vasospasm. however, the b/l MCA MFVs seem improved from 3 days ago, as well as the b/l Lindegaard ratios. Yet the MFV of left ACA was significant elevated from 3 days ago, further close monitoring warranted. clinical correlation recommended. *See table(s) above for TCD measurements and observations.  Diagnosing physician: Marvel Plan MD Electronically signed by Marvel Plan MD on 05/28/2021 at 2:41:32 PM.    Final    ECHOCARDIOGRAM LIMITED  Result Date: 05/23/2021    ECHOCARDIOGRAM LIMITED REPORT   Patient Name:   LINSEY HIROTA Date of Exam: 05/23/2021 Medical Rec #:  [id number]        Height:       69.0 in Accession #:    [id number]       Weight:       141.3 lb Date of Birth:  [birth year]        BSA:          1.782 m Patient Age:    60 years         BP:           135/73 mmHg Patient Gender: M                HR:           85 bpm. Exam Location:  Inpatient Procedure: Limited Echo, Cardiac Doppler and Color Doppler STAT ECHO Indications:    Stroke  History:        Patient has no prior history of Echocardiogram examinations.                 Subarachnoid hemorrhage. H/o cocaine use.  Sonographer:    Ross Ludwig RDCS (AE) Referring Phys: [id number] Jadene Pierini IMPRESSIONS  1. Left ventricular ejection fraction, by estimation, is 60 to 65%. The left ventricle has normal function. The left ventricle has no regional wall motion abnormalities. Left ventricular diastolic parameters were normal.  2. Right ventricular systolic function is normal. The right ventricular size is normal.  3. The mitral valve is normal in  structure. No evidence of mitral valve regurgitation. No evidence of mitral stenosis.  4. The aortic valve is tricuspid. Aortic valve regurgitation is not visualized. No aortic stenosis is present.  5. The inferior vena cava is normal in size with greater than 50% respiratory variability, suggesting right atrial pressure of 3 mmHg. FINDINGS  Left Ventricle: Left ventricular ejection fraction, by estimation, is 60 to 65%. The left ventricle has normal function. The left ventricle has no regional wall motion abnormalities. The left ventricular internal cavity size was normal in size. There is  no left ventricular hypertrophy. Left ventricular diastolic parameters were normal. Right Ventricle: The right ventricular size is normal.Right ventricular systolic function is normal. Left Atrium: Left atrial size was normal in size. Right Atrium: Right atrial size was normal in size. Pericardium: There is no evidence of pericardial effusion. Mitral Valve: The mitral valve is normal in structure. No evidence of mitral valve stenosis. Tricuspid Valve: The tricuspid valve is normal in structure. Tricuspid valve regurgitation is trivial. No evidence of tricuspid stenosis. Aortic Valve: The aortic valve  is tricuspid. Aortic valve regurgitation is not visualized. No aortic stenosis is present. Pulmonic Valve: The pulmonic valve was normal in structure. Pulmonic valve regurgitation is not visualized. No evidence of pulmonic stenosis. Aorta: The aortic root is normal in size and structure. Venous: The inferior vena cava is normal in size with greater than 50% respiratory variability, suggesting right atrial pressure of 3 mmHg. IAS/Shunts: No atrial level shunt detected by color flow Doppler. LEFT VENTRICLE PLAX 2D LVIDd:         4.40 cm  Diastology LVIDs:         2.90 cm  LV e' medial:    9.14 cm/s LV PW:         1.10 cm  LV E/e' medial:  8.9 LV IVS:        0.80 cm  LV e' lateral:   10.20 cm/s LVOT diam:     2.00 cm  LV E/e' lateral:  8.0 LVOT Area:     3.14 cm  IVC IVC diam: 1.30 cm LEFT ATRIUM         Index LA diam:    2.90 cm 1.63 cm/m   AORTA Ao Root diam: 3.40 cm Ao Asc diam:  3.10 cm MITRAL VALVE MV Area (PHT): 3.30 cm    SHUNTS MV Decel Time: 230 msec    Systemic Diam: 2.00 cm MV E velocity: 81.50 cm/s MV A velocity: 84.80 cm/s MV E/A ratio:  0.96 Olga Millers MD Electronically signed by Olga Millers MD Signature Date/Time: 05/23/2021/11:48:54 AM    Final    [place] EKG SITE RITE  Result Date: 05/23/2021 If Site Rite image not attached, placement could not be confirmed due to current cardiac rhythm.      Subjective: RESTING COMFORTABLY IN BED MOTHER AT BEDSIDE  Objective: Vitals:   06/21/21 0438 06/21/21 0500 06/21/21 0821 06/21/21 1205  BP: (!) 127/92  (!) 112/91 (P) 129/81  Pulse: 85  86   Resp: 18  18 (P) 18  Temp: 98.2 F (36.8 C)  98.3 F (36.8 C) (P) 98.4 F (36.9 C)  TempSrc: Oral  Oral (P) Axillary  SpO2: 100%  100% (P) 100%  Weight:  60.6 kg    Height:        Intake/Output Summary (Last 24 hours) at 06/21/2021 1259 Last data filed at 06/21/2021 0500 Gross per 24 hour  Intake --  Output 1350 ml  Net -1350 ml   Filed Weights   06/18/21 0500 06/20/21 0420 06/21/21 0500  Weight: 60.4 kg 60.7 kg 60.6 kg    Examination:  General exam: MIN INTERACTIVE-UNCHANGED FROM PRIOR-NO EVID OF AGITATION Respiratory system: Clear to auscultation. Respiratory effort normal. Cardiovascular system: S1 & S2 heard, RRR. No JVD, murmurs, rubs, gallops or clicks. No pedal edema. Gastrointestinal system: Abdomen is nondistended, soft . Quiet bowel sounds heard. Central nervous system: min interactive reflexes in place Extremities:WWP, NO NEW CONTRACTURES Skin: No rashes, lesions or ulcers Psychiatry: Judgement and insight SEVERELY IMPAIRED, Mood & affect FLAT .     Data Reviewed: I have personally reviewed following labs and imaging studies  CBC: Recent Labs  Lab 06/16/21 0340  WBC 10.2  HGB 13.4   HCT 39.6  MCV 98.5  PLT 275   Basic Metabolic Panel: Recent Labs  Lab 06/15/21 0747 06/17/21 0251  NA 144 143  K 5.0 3.5  CL 106 109  CO2 30 28  GLUCOSE 111* 124*  BUN 16 20  CREATININE 0.71 0.56*  CALCIUM 9.8 9.5  GFR: Estimated Creatinine Clearance: 85.2 mL/min (A) (by C-G formula based on SCr of 0.56 mg/dL (L)). Liver Function Tests: No results for input(s): AST, ALT, ALKPHOS, BILITOT, PROT, ALBUMIN in the last 168 hours. No results for input(s): LIPASE, AMYLASE in the last 168 hours. No results for input(s): AMMONIA in the last 168 hours. Coagulation Profile: No results for input(s): INR, PROTIME in the last 168 hours. Cardiac Enzymes: No results for input(s): CKTOTAL, CKMB, CKMBINDEX, TROPONINI in the last 168 hours. BNP (last 3 results) No results for input(s): PROBNP in the last 8760 hours. HbA1C: No results for input(s): HGBA1C in the last 72 hours. CBG: Recent Labs  Lab 06/20/21 1956 06/21/21 0032 06/21/21 0433 06/21/21 0817 06/21/21 1203  GLUCAP 127* 116* 110* 143* 131*   Lipid Profile: No results for input(s): CHOL, HDL, LDLCALC, TRIG, CHOLHDL, LDLDIRECT in the last 72 hours. Thyroid Function Tests: No results for input(s): TSH, T4TOTAL, FREET4, T3FREE, THYROIDAB in the last 72 hours. Anemia Panel: No results for input(s): VITAMINB12, FOLATE, FERRITIN, TIBC, IRON, RETICCTPCT in the last 72 hours. Sepsis Labs: No results for input(s): PROCALCITON, LATICACIDVEN in the last 168 hours.  No results found for this or any previous visit (from the past 240 hour(s)).       Radiology Studies: No results found.      Scheduled Meds:  chlorhexidine  15 mL Mouth Rinse BID   Chlorhexidine Gluconate Cloth  6 each Topical Daily   feeding supplement (PROSource TF)  45 mL Per Tube BID   fiber  1 packet Per Tube BID   free water  100 mL Per Tube Q4H   heparin injection (subcutaneous)  5,000 Units Subcutaneous Q8H   mouth rinse  15 mL Mouth Rinse q12n4p    sodium chloride  1 g Per Tube BID WC   Continuous Infusions:  feeding supplement (JEVITY 1.2 CAL) 1,560 mL (06/20/21 1234)     LOS: 33 days    Time spent: 35 MIN    Burke Keels, MD [facility]  If 7PM-7AM, please contact night-coverage  06/21/2021, 12:59 PM

## 2021-06-22 LAB — GLUCOSE, CAPILLARY
Glucose-Capillary: 102 mg/dL — ABNORMAL HIGH (ref 70–99)
Glucose-Capillary: 118 mg/dL — ABNORMAL HIGH (ref 70–99)
Glucose-Capillary: 118 mg/dL — ABNORMAL HIGH (ref 70–99)
Glucose-Capillary: 125 mg/dL — ABNORMAL HIGH (ref 70–99)
Glucose-Capillary: 129 mg/dL — ABNORMAL HIGH (ref 70–99)
Glucose-Capillary: 131 mg/dL — ABNORMAL HIGH (ref 70–99)
Glucose-Capillary: 141 mg/dL — ABNORMAL HIGH (ref 70–99)

## 2021-06-22 MED ORDER — NYSTATIN 100000 UNIT/GM EX POWD
1.0000 "application " | Freq: Three times a day (TID) | CUTANEOUS | Status: DC
  Administered 2021-06-22 – 2021-07-05 (×37): 1 via TOPICAL
  Filled 2021-06-22 (×2): qty 15

## 2021-06-22 NOTE — Progress Notes (Signed)
PROGRESS NOTE    LESTON SCHUELLER  NWG:[id number] DOB: 11/26/1961 DOA: 05/19/2021 PCP: Pcp, No    Brief Narrative:  [name] [name] is a 60 year old male with past medical history significant for essential hypertension, substance abuse with marijuana/EtOH/crack cocaine who presented to [facility] on 5/25 after being found down by family and police after a welfare check given patient did not show up for work.  Brought in by EMS with patient being very lethargic.  CT head without contrast notable for moderate intraventricular hemorrhage with large SAH that was suspicious of an aneurysm.  Patient was seen emergently at bedside by neurosurgery and underwent external ventriculostomy drain at bedside by Dr. Wynetta Emery.  Patient was initially admitted to the [facility] service.  Patient underwent diagnostic cerebral angiogram by neurosurgery, Dr. Conchita Paris on 05/20/2021 with successful coil embolization of the right A1-A2 junction aneurysm.  Patient then underwent a laparoscopic assisted ventriculoperitoneal shunt placement by Dr. Conchita Paris and Dr. Janee Morn on 06/08/2021.  Patient was transferred to the hospitalist service on 06/10/2021.   Assessment & Plan:   Active Problems:   Cerebral salt-wasting   Aneurysmal subarachnoid hemorrhage (HCC)   Vasospasm of cerebral artery   Diarrheal stools   Subarachnoid hemorrhage 2/2 ACA aneurysm s/p coil embolization 5/26 Communicating hydrocephalus secondary to [facility] Patient presenting to the ED after being found down at home by family and police on a welfare check given patient did not show up for work.  Patient was noted to be altered, CT head notable for intraventricular hemorrhage with large SAH suspicious for aneurysm.  Seen by neurosurgery emergently at bedside s/p EVD 5/25.  Cerebral angiogram with coil embolization ACA aneurysm on 5/26.  Underwent VP shunt on 6/14.  Completed seizure prophylactic course of Keppra. --Continue aspiration/seizure precautions --Supportive  care --Continues with staples in place from VP shunt, awaiting to hear back from neurosurgery regarding removal, has been 14 days now.  Dysphagia Etiology likely secondary to hemorrhagic stroke as above. --Continue tube feeds via PEG  Hyponatremia: Resolved Suspect cerebral salt wasting syndrome.  Last potassium 143 on 6/23. --Continues on sodium chloride tablets 1 g per tube twice daily --Repeat BMP in a.m.  Hypokalemia Last potassium checked on 6/23, 3.5. --We will check BMP and magnesium level in the a.m.  Physical deconditioning in the setting of SAH: --Continue PT/OT efforts, TOC for placement  History of polysubstance abuse with EtOH/marijuana/distant history of crack cocaine --Abstinence  DVT prophylaxis: heparin injection 5,000 Units Start: 06/15/21 2230 SCD's Start: 05/19/21 2021   Code Status: Full Code Family Communication: No family present at bedside this morning.  Disposition Plan:  Level of care: Progressive Status is: Inpatient  Remains inpatient appropriate because:Unsafe d/c plan  Dispo: The patient is from: Home              Anticipated d/c is to: SNF              Patient currently is medically stable to d/c.   Difficult to place patient Yes   Significant Events: 5/25 Admitted with [facility] secondary to ruptured aneurysm, ventriculostomy preformed in ED, Ancef started 5/26 Coil Embolization 5/29 worsening neurological exam consistent with vasospasm, hemodynamic augmentation initiated. 6/1 worsening hyponatremia: Na of 120.  Patient started on salt tablets 1 g twice daily and fludrocortisone 0.1 mg. 6/2 BP goal reduced 160 -180.  Salt Tablet increased to 1 g tid. Fludrocortisone increased 0.2 mg. 6/8 Ongoing agitation, intermittently following commands. CT Head with worsening hydrocephalus despite EVD, open to 10 per  NSGY. 6/9 EVD remains in place, repeat head CT as below 6/10-12, no significant neuro changes, intermittently agitated.  Ongoing EVD, plans  for VP shunt next week 6/13 no neuro changes, remains intermittently restless 6/14 went for VP shunt, no neuro changes, afebrile 6/21 PEG tube placement by IR    Consultants:  PCCM Neurosurgery General surgery -assisted neurosurgery with laparoscopic portion of VP shunt placement  Procedures:  Bedside EVD by Dr. Wynetta Emery on 5/25 ACA coil embolization 5/26, Dr. Conchita Paris VP shunt placement 6/14, Dr. Conchita Paris and Dr. Janee Morn PEG tube placement by IR 6/21, Dr. Grace Isaac  Antimicrobials:  Cefazolin 5/25 - 6/2, 6/14 - 6/15, 6/21 - 6/22,    Subjective: Patient seen and examined at bedside, resting comfortably.  Sleeping but arousable.  Nonverbal to myself this morning.  But nursing staff at bedside states he was more awake earlier this morning and talkative.  No family present at bedside.  Awaiting SNF placement.  Continues on tube feeds.  Blood pressures controlled.  Objective: Vitals:   06/22/21 0346 06/22/21 0500 06/22/21 0734 06/22/21 1133  BP: (!) 149/94  (!) 114/99 (!) 123/97  Pulse: 84  82 94  Resp:   16 18  Temp: (!) 97.5 F (36.4 C)  97.7 F (36.5 C) 97.8 F (36.6 C)  TempSrc: Oral  Oral Oral  SpO2: 100%  100% 100%  Weight:  61 kg    Height:        Intake/Output Summary (Last 24 hours) at 06/22/2021 1221 Last data filed at 06/22/2021 0500 Gross per 24 hour  Intake 755 ml  Output 1700 ml  Net -945 ml   Filed Weights   06/20/21 0420 06/21/21 0500 06/22/21 0500  Weight: 60.7 kg 60.6 kg 61 kg    Examination:  General exam: Appears calm and comfortable, chronically ill in appearance, appears older than stated age HEENT: Noted staples remain in place to scalp Respiratory system: Clear to auscultation. Respiratory effort normal.  On room air. Cardiovascular system: S1 & S2 heard, RRR. No JVD, murmurs, rubs, gallops or clicks. No pedal edema. Gastrointestinal system: Abdomen is nondistended, soft and nontender. No organomegaly or masses felt. Normal bowel sounds  heard. Central nervous system: Alert. No focal neurological deficits. Extremities: Symmetric 5 x 5 power. Skin: No rashes, lesions or ulcers Psychiatry: Judgement and insight appear poor. Mood & affect appropriate.     Data Reviewed: I have personally reviewed following labs and imaging studies  CBC: Recent Labs  Lab 06/16/21 0340  WBC 10.2  HGB 13.4  HCT 39.6  MCV 98.5  PLT 275   Basic Metabolic Panel: Recent Labs  Lab 06/17/21 0251  NA 143  K 3.5  CL 109  CO2 28  GLUCOSE 124*  BUN 20  CREATININE 0.56*  CALCIUM 9.5   GFR: Estimated Creatinine Clearance: 85.8 mL/min (A) (by C-G formula based on SCr of 0.56 mg/dL (L)). Liver Function Tests: No results for input(s): AST, ALT, ALKPHOS, BILITOT, PROT, ALBUMIN in the last 168 hours. No results for input(s): LIPASE, AMYLASE in the last 168 hours. No results for input(s): AMMONIA in the last 168 hours. Coagulation Profile: No results for input(s): INR, PROTIME in the last 168 hours. Cardiac Enzymes: No results for input(s): CKTOTAL, CKMB, CKMBINDEX, TROPONINI in the last 168 hours. BNP (last 3 results) No results for input(s): PROBNP in the last 8760 hours. HbA1C: No results for input(s): HGBA1C in the last 72 hours. CBG: Recent Labs  Lab 06/21/21 2049 06/22/21 0058 06/22/21 0435 06/22/21  0732 06/22/21 1131  GLUCAP 112* 125* 118* 118* 141*   Lipid Profile: No results for input(s): CHOL, HDL, LDLCALC, TRIG, CHOLHDL, LDLDIRECT in the last 72 hours. Thyroid Function Tests: No results for input(s): TSH, T4TOTAL, FREET4, T3FREE, THYROIDAB in the last 72 hours. Anemia Panel: No results for input(s): VITAMINB12, FOLATE, FERRITIN, TIBC, IRON, RETICCTPCT in the last 72 hours. Sepsis Labs: No results for input(s): PROCALCITON, LATICACIDVEN in the last 168 hours.  No results found for this or any previous visit (from the past 240 hour(s)).       Radiology Studies: No results found.      Scheduled Meds:   chlorhexidine  15 mL Mouth Rinse BID   Chlorhexidine Gluconate Cloth  6 each Topical Daily   feeding supplement (PROSource TF)  45 mL Per Tube BID   fiber  1 packet Per Tube BID   free water  100 mL Per Tube Q4H   heparin injection (subcutaneous)  5,000 Units Subcutaneous Q8H   mouth rinse  15 mL Mouth Rinse q12n4p   sodium chloride  1 g Per Tube BID WC   Continuous Infusions:  feeding supplement (JEVITY 1.2 CAL) 65 mL/hr at 06/22/21 0500     LOS: 34 days    Time spent: 36 minutes spent on chart review, discussion with nursing staff, consultants, updating family and interview/physical exam; more than 50% of that time was spent in counseling and/or coordination of care.    Alvira Philips [place], DO [facility] Available via Epic secure chat 7am-7pm After these hours, please refer to coverage provider listed on [email or web address] 06/22/2021, 12:21 PM

## 2021-06-22 NOTE — Plan of Care (Signed)
  Problem: Clinical Measurements: Goal: Will remain free from infection Outcome: Progressing Goal: Diagnostic test results will improve Outcome: Progressing Goal: Respiratory complications will improve Outcome: Progressing Goal: Cardiovascular complication will be avoided Outcome: Progressing   Problem: Activity: Goal: Risk for activity intolerance will decrease Outcome: Progressing   

## 2021-06-22 NOTE — Progress Notes (Addendum)
11:30am: Patient will not be offered a bed at the [facility] due to his history of marijuana use.  10am: CSW spoke with Okey Regal at the [facility] who states the patient's referral is still under review at this time.  CSW spoke with Lynnea Ferrier at [facility] who states the facility cannot offer the patient a bed.  CSW spoke with [employer] at [facility] and the facility does not accept patient's insurance.  CSW left voicemail's for admissions staff at [facility], [address] [facility] [place], and [facility].  Edwin Dada, MSW, LCSW Transitions of Care  Clinical Social Worker II [phone]

## 2021-06-22 NOTE — Progress Notes (Signed)
  Speech Language Pathology Treatment: Dysphagia  Patient Details Name: [name] [name] MRN: [id number] DOB: 01-07-1961 Today's Date: 06/22/2021 Time: 3662-9476 SLP Time Calculation (min) (ACUTE ONLY): 14 min  Assessment / Plan / Recommendation Clinical Impression  Targeted dysphagia intervention during co-treatment with OT. Pt sitting on edge of the bed, performing some self feeding tasks as directed by OT. Pt mentation continues to improve per prior SLP encounter notes. Pt able to masticate solid PO snack (note some impulsivity in rate of consumption). Pt able to clear oral cavity with extended time. No overt s/sx of aspiration with thin liquids, puree, or solid POs assessed this date. Will advance diet to dysphagia 3 (mechanical soft) continue thin liquids. Pt will continue to need full supervision with all PO as mentation fluctuates to ensure adequate alertness and tolerance across a meal. SLP to continue to closely monitor.     HPI HPI: Pt is a 40 you w/ PMH sinusitis, rotator cuff tear, tobacco, marijuana, and ETOH use, remote hx of crack cocaine use who is being managed for parenchymal hemorrhage within the anterior  left frontal lobe. Mild surrounding edema within the anterior left  frontal lobe, now s/p ventriculostomy. Ventric removed and shunt placed 6/14.      SLP Plan  Continue with current plan of care       Recommendations  Diet recommendations: Dysphagia 3 (mechanical soft);Thin liquid Liquids provided via: Cup;Straw Medication Administration: Crushed with puree Supervision: Full supervision/cueing for compensatory strategies;Staff to assist with self feeding Compensations: Small sips/bites;Slow rate;Minimize environmental distractions Postural Changes and/or Swallow Maneuvers: Seated upright 90 degrees;Upright 30-60 min after meal                Oral Care Recommendations: Oral care BID Follow up Recommendations: Skilled Nursing facility SLP Visit Diagnosis:  Dysphagia, oral phase (R13.11);Dysphagia, unspecified (R13.10) Plan: Continue with current plan of care       GO                Ardyth Gal MA, CCC-SLP Acute Rehabilitation Services   06/22/2021, 4:05 PM

## 2021-06-22 NOTE — Progress Notes (Signed)
Occupational Therapy Treatment Patient Details Name: [name] [name] MRN: [id number] DOB: 15-Nov-1961 Today's Date: 06/22/2021    History of present illness 60 y.o. M who was admitted to ICU 05/19/21 with IVH with large acute SAH now s/p ventriculostomy. 5/26 coil embolization. 5/29 worsening neurological exam consistent with vasospasm. VP shunt placement 6/14. Significant PMH: rotator cuff tear, tobacco, marijuana and ETOH use, remote hx crack cocaine use.   OT comments  Pt progressing towards established OT goals. Pt demonstrating increased engagement and sustained attention this session. Pt performing self feeding and grooming tasks while sitting at EOB. Pt requiring Min-Max A for sitting balance support pending his fatigue and engagement in tasks. Pt completing self feeding demonstrating increased attention, bilateral coordination, AROM, and activity tolerance. Also, using soft jazz music for increased arousal (modifying volume and song selection to prevent overstimulation). Continue to recommend dc to SNF and will continue to follow acutely as admitted.    Follow Up Recommendations  SNF    Equipment Recommendations  Wheelchair (measurements OT);Wheelchair cushion (measurements OT);Hospital bed    Recommendations for Other Services Rehab consult    Precautions / Restrictions Precautions Precautions: Fall;Other (comment) Precaution Comments: safety mitts, tube feedings       Mobility Bed Mobility Overal bed mobility: Needs Assistance Bed Mobility: Supine to Sit;Sit to Sidelying;Rolling Rolling: Max assist   Supine to sit: Mod assist;HOB elevated   Sit to sidelying: Max assist;+2 for physical assistance General bed mobility comments: Tactile cues for brigning BLEs towards EOB. Pt holding therapist's hand and pulling into upright posture with support at back. Return to supine with Max A +2    Transfers                      Balance Overall balance assessment: Needs  assistance Sitting-balance support: No upper extremity supported;Feet supported Sitting balance-Leahy Scale: Poor Sitting balance - Comments: slight posterior lean; increased with fatigue. Requiring Min-Max A for support during sitting Postural control: Posterior lean;Right lateral lean                                 ADL either performed or assessed with clinical judgement   ADL Overall ADL's : Needs assistance/impaired Eating/Feeding: Set up;Supervision/ safety;Sitting Eating/Feeding Details (indicate cue type and reason): Requiring Min-Max A for supportive sitting at EOB. Pt engaged and participating in self feeding of ice cream, graham cracker, and water (no lid or straw). Pt performing bialteral coorindation to hold ice cream cup in L hand and manage spoon with R. Grooming: Wash/dry face;Sitting;Set up;Supervision/safety Grooming Details (indicate cue type and reason): Min-Max A for sitting support. Pt washing his face with supervision and cues. Pt once reaching for wash cloth without prompting to wipe with mouth during self feeding task                     Toileting- Clothing Manipulation and Hygiene: Total assistance;Bed level Toileting - Clothing Manipulation Details (indicate cue type and reason): Total A for peri care after bowel incontenience in bed       General ADL Comments: Focused session on sitting balance, activity tolerance, and participation in simple ADL. Pt performing self feeding and grooming     Vision   Additional Comments: Will continue to assess   Perception     Praxis      Cognition Arousal/Alertness: Lethargic;Suspect due to medications Behavior During Therapy: Flat affect (occasionally smiling  and chuckling) Overall Cognitive Status: Impaired/Different from baseline Area of Impairment: Orientation;Attention;Following commands;Safety/judgement;Awareness;Problem solving                 Orientation Level: Disoriented  to;Place;Time;Situation Current Attention Level: Sustained Memory: Decreased short-term memory Following Commands: Follows one step commands inconsistently;Follows one step commands with increased time Safety/Judgement: Decreased awareness of safety;Decreased awareness of deficits Awareness: Intellectual Problem Solving: Slow processing;Difficulty sequencing;Requires verbal cues;Requires tactile cues General Comments: [name] [name] more engaged this session and particiapting in ADLs and conversation. Pt demonstrating sustained attention during simple self feedind task while at EOB. During conversation, pt presenting with decreased attention and becoming both internally and externally distracted; requiring increased time or repeating of cues. As [name] [name] fatigued, he became less conversational and requiring increased cues.        Exercises     Shoulder Instructions       General Comments bowel movement at end of session; feel pt still actively going. Notified NT    Pertinent Vitals/ Pain       Pain Assessment: Faces Faces Pain Scale: No hurt Pain Intervention(s): Monitored during session;Repositioned  Home Living                                          Prior Functioning/Environment              Frequency  Min 2X/week        Progress Toward Goals  OT Goals(current goals can now be found in the care plan section)     Acute Rehab OT Goals Time For Goal Achievement: 06/29/21 ADL Goals Pt Will Perform Grooming: with min assist;sitting Pt Will Perform Lower Body Dressing: with mod assist;sitting/lateral leans;sit to/from stand Pt Will Perform Toileting - Clothing Manipulation and hygiene: with mod assist;sit to/from stand Pt/caregiver will Perform Home Exercise Program: Increased ROM;Increased strength;Both right and left upper extremity;With minimal assist;With written HEP provided Additional ADL Goal #1: Pt will follow 1-2 step commands with minimal cues to attend  to task in 3/5 trials. Additional ADL Goal #2: Pt will utilize strategies for L inattention in order to participate with tasks on L side with minimal cues in 3/5 trials.  Plan Discharge plan remains appropriate    Co-evaluation      Reason for Co-Treatment: To address functional/ADL transfers;Other (comment) (ADLS self feeding, swallowing therapy, cognitive therapy with OT)          AM-PAC OT "6 Clicks" Daily Activity     Outcome Measure                    End of Session    OT Visit Diagnosis: Unsteadiness on feet (R26.81);Muscle weakness (generalized) (M62.81);Other symptoms and signs involving cognitive function   Activity Tolerance Patient tolerated treatment well   Patient Left in bed;with call bell/phone within reach;with bed alarm set;with restraints reapplied   Nurse Communication Mobility status (Continued BM)        Time: 4401-0272 OT Time Calculation (min): 36 min  Charges: OT General Charges $OT Visit: 1 Visit OT Treatments $Self Care/Home Management : 23-37 mins  [name] [name] MSOT, OTR/L Acute Rehab Pager: [phone] Office: [phone]   Theodoro Grist [place] 06/22/2021, 4:32 PM

## 2021-06-23 LAB — BASIC METABOLIC PANEL
Anion gap: 7 (ref 5–15)
BUN: 15 mg/dL (ref 6–20)
CO2: 28 mmol/L (ref 22–32)
Calcium: 9.8 mg/dL (ref 8.9–10.3)
Chloride: 107 mmol/L (ref 98–111)
Creatinine, Ser: 0.59 mg/dL — ABNORMAL LOW (ref 0.61–1.24)
GFR, Estimated: >60 mL/min (ref >60)
Glucose, Bld: 114 mg/dL — ABNORMAL HIGH (ref 70–99)
Potassium: 3.7 mmol/L (ref 3.5–5.1)
Sodium: 142 mmol/L (ref 135–145)

## 2021-06-23 LAB — GLUCOSE, CAPILLARY
Glucose-Capillary: 115 mg/dL — ABNORMAL HIGH (ref 70–99)
Glucose-Capillary: 108 mg/dL — ABNORMAL HIGH (ref 70–99)
Glucose-Capillary: 138 mg/dL — ABNORMAL HIGH (ref 70–99)
Glucose-Capillary: 117 mg/dL — ABNORMAL HIGH (ref 70–99)
Glucose-Capillary: 97 mg/dL (ref 70–99)

## 2021-06-23 LAB — MAGNESIUM: Magnesium: 2 mg/dL (ref 1.7–2.4)

## 2021-06-23 NOTE — Plan of Care (Signed)
  Problem: Education: Goal: Knowledge of patient specific risk factors addressed and post discharge goals established will improve Outcome: Progressing   Problem: Education: Goal: Knowledge of secondary prevention will improve Outcome: Progressing   Problem: Education: Goal: Knowledge of disease or condition will improve Outcome: Progressing   Problem: Skin Integrity: Goal: Risk for impaired skin integrity will decrease Outcome: Progressing   Problem: Safety: Goal: Ability to remain free from injury will improve Outcome: Progressing

## 2021-06-23 NOTE — Progress Notes (Signed)
NF PROGRESS NOTE    JEDEDIAH NODA  XKG:[id number] DOB: [birth year] DOA: 05/19/2021 PCP: Pcp, No    Brief Narrative:  [name] [name] is a 60 year old male with past medical history significant for essential hypertension, substance abuse with marijuana/EtOH/crack cocaine who presented to [facility] on 5/25 after being found down by family and police after a welfare check given patient did not show up for work.  Brought in by EMS with patient being very lethargic.  CT head without contrast notable for moderate intraventricular hemorrhage with large SAH that was suspicious of an aneurysm.  Patient was seen emergently at bedside by neurosurgery and underwent external ventriculostomy drain at bedside by Dr. Wynetta Emery.  Patient was initially admitted to the [facility] service.  Patient underwent diagnostic cerebral angiogram by neurosurgery, Dr. Conchita Paris on 05/20/2021 with successful coil embolization of the right A1-A2 junction aneurysm.  Patient then underwent a laparoscopic assisted ventriculoperitoneal shunt placement by Dr. Conchita Paris and Dr. Janee Morn on 06/08/2021.  Patient was transferred to the hospitalist service on 06/10/2021.  Currently medically stable for discharge pending SNF but difficult to place per TOC.   Assessment & Plan:   Active Problems:   Cerebral salt-wasting   Aneurysmal subarachnoid hemorrhage (HCC)   Vasospasm of cerebral artery   Diarrheal stools   Subarachnoid hemorrhage 2/2 ACA aneurysm s/p coil embolization 5/26 Communicating hydrocephalus secondary to [facility] Patient presenting to the ED after being found down at home by family and police on a welfare check given patient did not show up for work.  Patient was noted to be altered, CT head notable for intraventricular hemorrhage with large SAH suspicious for aneurysm.  Seen by neurosurgery emergently at bedside s/p EVD 5/25.  Cerebral angiogram with coil embolization ACA aneurysm on 5/26.  Underwent VP shunt on 6/14.  Completed seizure  prophylactic course of Keppra. Surgical staples of VP shunt removed on 6/29.  Outpatient follow-up with neurosurgery.   Dysphagia Etiology likely secondary to hemorrhagic stroke as above.  Tolerating tube feeds via PEG.  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  Remains on sodium chloride tablets 1 g 3 times daily.?  Gradually wean off salt tablets.  Hypokalemia Replaced.  Magnesium normal.  Physical deconditioning in the setting of SAH: --Continue PT/OT efforts, TOC for placement  History of polysubstance abuse with EtOH/marijuana/distant history of crack cocaine --Abstinence  DVT prophylaxis: heparin injection 5,000 Units Start: 06/15/21 2230 SCD's Start: 05/19/21 2021   Code Status: Full Code Family Communication: Discussed in detail with patient's mother at bedside, updated care and answered all questions.  Remains inpatient appropriate because:Unsafe d/c plan  Dispo: The patient is from: Home              Anticipated d/c is to: SNF              Patient currently is medically stable to d/c.   Difficult to place patient Yes   Significant Events: 5/25 Admitted with [name] secondary to ruptured aneurysm, ventriculostomy preformed in ED, Ancef started 5/26 Coil Embolization 5/29 worsening neurological exam consistent with vasospasm, hemodynamic augmentation initiated. 6/1 worsening hyponatremia: Na of 120.  Patient started on salt tablets 1 g twice daily and fludrocortisone 0.1 mg. 6/2 BP goal reduced 160 -180.  Salt Tablet increased to 1 g tid. Fludrocortisone increased 0.2 mg. 6/8 Ongoing agitation, intermittently following commands. CT Head with worsening hydrocephalus despite EVD, open to 10 per NSGY. 6/9 EVD remains in place, repeat head CT as below 6/10-12, no significant neuro changes,  intermittently agitated.  Ongoing EVD, plans for VP shunt next week 6/13 no neuro changes, remains intermittently restless 6/14 went for VP shunt, no neuro changes,  afebrile 6/21 PEG tube placement by IR    Consultants:  PCCM Neurosurgery General surgery -assisted neurosurgery with laparoscopic portion of VP shunt placement  Procedures:  Bedside EVD by Dr. Wynetta Emery on 5/25 ACA coil embolization 5/26, Dr. Conchita Paris VP shunt placement 6/14, Dr. Conchita Paris and Dr. Janee Morn PEG tube placement by IR 6/21, Dr. Grace Isaac  Antimicrobials:  Cefazolin 5/25 - 6/2, 6/14 - 6/15, 6/21 - 6/22,    Subjective: Patient seen along with mother at bedside.  Patient sleeping.  Difficult to arouse.  As per mother, was awake earlier this morning, watched a little bit of TV, spoke a few words with her, ate breakfast and then went off to sleep.  Objective: Vitals:   06/23/21 0500 06/23/21 0801 06/23/21 1151 06/23/21 1536  BP:  (!) 134/95 (!) 114/91 128/86  Pulse:  79 87 80  Resp:  18 16 16   Temp:  98.1 F (36.7 C) (!) 97.5 F (36.4 C) 98 F (36.7 C)  TempSrc:  Oral Oral Oral  SpO2:  100% 100% 100%  Weight: 58.4 kg     Height:        Intake/Output Summary (Last 24 hours) at 06/23/2021 1839 Last data filed at 06/23/2021 1100 Gross per 24 hour  Intake --  Output 1100 ml  Net -1100 ml   Filed Weights   06/21/21 0500 06/22/21 0500 06/23/21 0500  Weight: 60.6 kg 61 kg 58.4 kg    Examination:  General exam: Appears calm and comfortable, chronically ill in appearance, appears older than stated age HEENT: Scalp staples removed by RN this morning. Respiratory system: Clear to auscultation.  No increased work of breathing. Cardiovascular system: S1 and S2 heard, RRR.  No JVD, murmurs or pedal edema. Gastrointestinal system: Abdomen is nondistended, soft and nontender. No organomegaly or masses felt. Normal bowel sounds heard. Central nervous system: Mental status as noted above.  No focal neurological deficits. Extremities: Spontaneously moves all limbs. Skin: No rashes, lesions or ulcers Psychiatry: Judgement and insight cannot be assessed. Mood & affect cannot be  assessed    Data Reviewed: I have personally reviewed following labs and imaging studies  CBC: No results for input(s): WBC, NEUTROABS, HGB, HCT, MCV, PLT in the last 168 hours.  Basic Metabolic Panel: Recent Labs  Lab 06/17/21 0251 06/23/21 0327  NA 143 142  K 3.5 3.7  CL 109 107  CO2 28 28  GLUCOSE 124* 114*  BUN 20 15  CREATININE 0.56* 0.59*  CALCIUM 9.5 9.8  MG  --  2.0   GFR: Estimated Creatinine Clearance: 82.1 mL/min (A) (by C-G formula based on SCr of 0.59 mg/dL (L)).  CBG: Recent Labs  Lab 06/22/21 2324 06/23/21 0340 06/23/21 0758 06/23/21 1148 06/23/21 1539  GLUCAP 102* 115* 108* 138* 117*      Radiology Studies: No results found.      Scheduled Meds:  chlorhexidine  15 mL Mouth Rinse BID   Chlorhexidine Gluconate Cloth  6 each Topical Daily   feeding supplement (PROSource TF)  45 mL Per Tube BID   fiber  1 packet Per Tube BID   free water  100 mL Per Tube Q4H   heparin injection (subcutaneous)  5,000 Units Subcutaneous Q8H   mouth rinse  15 mL Mouth Rinse q12n4p   nystatin  1 application Topical TID   sodium chloride  1 g Per Tube BID WC   Continuous Infusions:  feeding supplement (JEVITY 1.2 CAL) 65 mL/hr at 06/22/21 0500     LOS: 35 days   Marcellus Scott, MD, [place], [facility]. [facility]  To contact the attending provider between 7A-7P or the covering provider during after hours 7P-7A, please log into the web site [email or web address] and access using universal [place] password for that web site. If you do not have the password, please call the hospital operator.

## 2021-06-23 NOTE — Plan of Care (Signed)
  Problem: Clinical Measurements: Goal: Will remain free from infection Outcome: Progressing Goal: Diagnostic test results will improve Outcome: Progressing Goal: Respiratory complications will improve Outcome: Progressing Goal: Cardiovascular complication will be avoided Outcome: Progressing   Problem: Activity: Goal: Risk for activity intolerance will decrease Outcome: Progressing   

## 2021-06-23 NOTE — Progress Notes (Signed)
12 staples removed from right side of forehead and 4 staples on right back side of head removed as ordered. Site intact and no drainage noted, patient tolerated well.

## 2021-06-24 LAB — GLUCOSE, CAPILLARY
Glucose-Capillary: 107 mg/dL — ABNORMAL HIGH (ref 70–99)
Glucose-Capillary: 109 mg/dL — ABNORMAL HIGH (ref 70–99)
Glucose-Capillary: 115 mg/dL — ABNORMAL HIGH (ref 70–99)
Glucose-Capillary: 123 mg/dL — ABNORMAL HIGH (ref 70–99)
Glucose-Capillary: 124 mg/dL — ABNORMAL HIGH (ref 70–99)
Glucose-Capillary: 128 mg/dL — ABNORMAL HIGH (ref 70–99)
Glucose-Capillary: 98 mg/dL (ref 70–99)

## 2021-06-24 MED ORDER — BOOST / RESOURCE BREEZE PO LIQD CUSTOM
1.0000 | Freq: Three times a day (TID) | ORAL | Status: DC
  Administered 2021-06-25 – 2021-06-29 (×14): 1 via ORAL
  Administered 2021-06-30: 355 mL via ORAL
  Administered 2021-06-30 – 2021-07-05 (×16): 1 via ORAL

## 2021-06-24 MED ORDER — JEVITY 1.5 CAL/FIBER PO LIQD
900.0000 mL | ORAL | Status: DC
  Administered 2021-06-25 – 2021-06-27 (×3): 900 mL
  Filled 2021-06-24 (×4): qty 948

## 2021-06-24 NOTE — Progress Notes (Signed)
Physical Therapy Treatment Patient Details Name: [name] [name] MRN: [id number] DOB: 12/11/1961 Today's Date: 06/24/2021    History of Present Illness 60 y.o. M who was admitted to ICU 05/19/21 with IVH with large acute SAH now s/p ventriculostomy. 5/26 coil embolization. 5/29 worsening neurological exam consistent with vasospasm. VP shunt placement 6/14. Significant PMH: rotator cuff tear, tobacco, marijuana and ETOH use, remote hx crack cocaine use.    PT Comments    Patient more alert and interactive this session but continues to be confused. Not oriented this session, stating name as "[name] [name]". Patient required maxA for bed mobility and maxA+2 for repeated sit to stands. Unable to come to full upright posture due to inability to extending B knees and resisting physical assistance to assist extending knees. Continue to recommend SNF for ongoing Physical Therapy.       Follow Up Recommendations  SNF     Equipment Recommendations  Wheelchair (measurements PT);Wheelchair cushion (measurements PT);Hospital bed;3in1 (PT);Other (comment)    Recommendations for Other Services       Precautions / Restrictions Precautions Precautions: Fall;Other (comment) Precaution Comments: safety mitts, PEG    Mobility  Bed Mobility Overal bed mobility: Needs Assistance Bed Mobility: Supine to Sit;Sit to Supine     Supine to sit: Max assist Sit to supine: Max assist   General bed mobility comments: maxA to bring LEs towards EOB as patient initiates but returns LEs back to original position. Required maxA to bring trunk towards EOB    Transfers Overall transfer level: Needs assistance Equipment used: 2 person hand held assist Transfers: Sit to/from [employer] Sit to Stand: Max assist;+2 physical assistance Stand pivot transfers: Max assist;+2 physical assistance       General transfer comment: maxA+2 to power up into standing and steady. Unable to achieve full erect  posture due to patient unable to extend knees and resisting physical assistance to extend B knees. Performed sit to stand x 6 during session and slow stand/squat pivots towards [facility] prior to sitting.  Ambulation/Gait             General Gait Details: unable   Stairs             Wheelchair Mobility    Modified Rankin (Stroke Patients Only) Modified Rankin (Stroke Patients Only) Pre-Morbid Rankin Score: No symptoms Modified Rankin: Severe disability     Balance Overall balance assessment: Needs assistance Sitting-balance support: No upper extremity supported;Feet supported Sitting balance-Leahy Scale: Poor Sitting balance - Comments: slight posterior lean; increased with fatigue. Requiring Min-Max A for support during sitting   Standing balance support: Bilateral upper extremity supported;During functional activity Standing balance-Leahy Scale: Zero Standing balance comment: max +2                            Cognition Arousal/Alertness: Awake/alert Behavior During Therapy: Flat affect Overall Cognitive Status: Impaired/Different from baseline Area of Impairment: Orientation;Attention;Following commands;Safety/judgement;Awareness;Problem solving                 Orientation Level: Disoriented to;Place;Time;Situation;Person Current Attention Level: Sustained Memory: Decreased short-term memory Following Commands: Follows one step commands inconsistently;Follows one step commands with increased time Safety/Judgement: Decreased awareness of safety;Decreased awareness of deficits Awareness: Intellectual Problem Solving: Slow processing;Difficulty sequencing;Requires verbal cues;Requires tactile cues General Comments: Stating name as "[name] [name]". Following simple commands with increased time. Required cues to attend to task as patient was distracted internally.      Exercises  General Comments        Pertinent Vitals/Pain Pain Assessment:  Faces Faces Pain Scale: No hurt Pain Intervention(s): Monitored during session    Home Living                      Prior Function            PT Goals (current goals can now be found in the care plan section) Acute Rehab PT Goals Patient Stated Goal: unable to state PT Goal Formulation: With patient/family Time For Goal Achievement: 06/28/21 Potential to Achieve Goals: Good Progress towards PT goals: Progressing toward goals    Frequency    Min 2X/week      PT Plan Current plan remains appropriate    Co-evaluation              AM-PAC PT "6 Clicks" Mobility   Outcome Measure  Help needed turning from your back to your side while in a flat bed without using bedrails?: A Lot Help needed moving from lying on your back to sitting on the side of a flat bed without using bedrails?: A Lot Help needed moving to and from a bed to a chair (including a wheelchair)?: Total Help needed standing up from a chair using your arms (e.g., wheelchair or bedside chair)?: Total Help needed to walk in hospital room?: Total Help needed climbing 3-5 steps with a railing? : Total 6 Click Score: 8    End of Session Equipment Utilized During Treatment: Gait belt Activity Tolerance: Patient tolerated treatment well Patient left: in bed;with call bell/phone within reach;with bed alarm set Nurse Communication: Mobility status PT Visit Diagnosis: Unsteadiness on feet (R26.81);Muscle weakness (generalized) (M62.81);Difficulty in walking, not elsewhere classified (R26.2);Other abnormalities of gait and mobility (R26.89)     Time: 2585-2778 PT Time Calculation (min) (ACUTE ONLY): 24 min  Charges:  $Therapeutic Activity: 23-37 mins                     [name] [name] A. Dan Humphreys PT, DPT Acute Rehabilitation Services Pager [phone] Office [phone]    Viviann Spare 06/24/2021, 4:43 PM

## 2021-06-24 NOTE — Progress Notes (Signed)
CSW spoke with Selena Batten of [employer] who states she is familiar with this patient as he has been at one of her facilities in the past - Selena Batten will not offer the patient a bed due to his history of substance abuse.  Edwin Dada, MSW, LCSW Transitions of Care  Clinical Social Worker II [phone]

## 2021-06-24 NOTE — Progress Notes (Signed)
Nutrition Follow-up  DOCUMENTATION CODES:   Not applicable  INTERVENTION:  -48 hour calorie count to be conducted over weekend, RD will follow-up with results on Monday, July 4th -Transition to nocturnal TF via PEG: Provide Jevity 1.5 @ 31ml/hr for 12 hours from 1800-0600 83ml Prosource TF BID free water Q4H  Provides 1430 kcals, 79g protein, free water ( total free water with flushes) Meets 79% minimum estimated calorie and protein needs  -Boost Breeze po TID, each supplement provides 250 kcal and 9 grams of protein -Magic cup TID with meals, each supplement provides 290 kcal and 9 grams of protein  NUTRITION DIAGNOSIS:   Inadequate oral intake related to inability to eat as evidenced by NPO status.  Progressing, pt now on dysphagia 3 diet with thin liquids  GOAL:   Patient will meet greater than or equal to 90% of their needs  progressing  MONITOR:   Diet advancement, Labs, Weight trends, TF tolerance, Skin, I & O's  REASON FOR ASSESSMENT:   Other (Comment)    ASSESSMENT:   Pt is a 60 you w/ PMH sinusitis, rotator cuff tear, tobacco, marijuana, and ETOH use, remote hx of crack cocaine use who is being managed for intraventricular hemorrhage w/ large acute SAH now s/p ventriculostomy.  5/27 cortrak placed 6/14 VP shunt 6/21 s/p PEG placement 6/22 diet advanced to dysphagia 1 with thin liquids 6/28 diet advanced to dysphagia 3 with thin liquids   Discussed pt with RN, SLP, and MD. Per SLP, pt doing better with oral intake, though no PO intake has been documented by RN staff. Plan for transition to nocturnal tube feeding with calorie count over the weekend. RD will follow up with results on Monday, July 4th.   Pt nonverbal and did not appear to be in distress at time of RD visit.  UOP: 1.9L x24 hours  Admit weight 63.5 kg Current weight 58.2 kg  Medications: Scheduled Meds:  chlorhexidine  15 mL Mouth Rinse BID   Chlorhexidine Gluconate  Cloth  6 each Topical Daily   feeding supplement (PROSource TF)  45 mL Per Tube BID   fiber  1 packet Per Tube BID   free water  100 mL Per Tube Q4H   heparin injection (subcutaneous)  5,000 Units Subcutaneous Q8H   mouth rinse  15 mL Mouth Rinse q12n4p   nystatin  1 application Topical TID   sodium chloride  1 g Per Tube BID WC   Continuous Infusions:  feeding supplement (JEVITY 1.2 CAL) 1,560 mL (06/24/21 0605)   Labs: Recent Labs  Lab 06/23/21 0327  NA 142  K 3.7  CL 107  CO2 28  BUN 15  CREATININE 0.59*  CALCIUM 9.8  MG 2.0  GLUCOSE 114*  CBGs 107-128-123   Diet Order:   Diet Order             DIET DYS 3 Room service appropriate? Yes with Assist; Fluid consistency: Thin  Diet effective now                   EDUCATION NEEDS:   Education needs have been addressed  Skin:  Skin Assessment: Skin Integrity Issues: Skin Integrity Issues:: Other (Comment), Incisions Incisions: head, abdomen Other: MASD scrotum  Last BM:  6/29 type 6  Height:   Ht Readings from Last 1 Encounters:  05/20/21 5\' 9"  (1.753 m)    Weight:   Wt Readings from Last 1 Encounters:  06/24/21 58.2 kg  Ideal Body Weight:  72.7 kg  BMI:  Body mass index is 18.95 kg/m.  Estimated Nutritional Needs:   Kcal:  1800-2100 kcals  Protein:  90-110 g  Fluid:  >/= 1.8 L    Eugene Gavia, MS, RD, LDN (she/her/hers) RD pager number and weekend/on-call pager number located in Amion.

## 2021-06-24 NOTE — Plan of Care (Signed)
  Problem: Education: Goal: Knowledge of General Education information will improve Description: Including pain rating scale, medication(s)/side effects and non-pharmacologic comfort measures 06/24/2021 1631 by Deliah Boston, RN Outcome: Progressing 06/24/2021 1628 by Deliah Boston, RN Outcome: Progressing   Problem: Health Behavior/Discharge Planning: Goal: Ability to manage health-related needs will improve 06/24/2021 1631 by Deliah Boston, RN Outcome: Progressing 06/24/2021 1628 by Deliah Boston, RN Outcome: Progressing   Problem: Clinical Measurements: Goal: Ability to maintain clinical measurements within normal limits will improve 06/24/2021 1631 by Deliah Boston, RN Outcome: Progressing 06/24/2021 1628 by Deliah Boston, RN Outcome: Progressing Goal: Will remain free from infection 06/24/2021 1631 by Deliah Boston, RN Outcome: Progressing 06/24/2021 1628 by Deliah Boston, RN Outcome: Progressing Goal: Diagnostic test results will improve 06/24/2021 1631 by Deliah Boston, RN Outcome: Progressing 06/24/2021 1628 by Deliah Boston, RN Outcome: Progressing Goal: Respiratory complications will improve 06/24/2021 1631 by Deliah Boston, RN Outcome: Progressing 06/24/2021 1628 by Deliah Boston, RN Outcome: Progressing Goal: Cardiovascular complication will be avoided 06/24/2021 1631 by Deliah Boston, RN Outcome: Progressing 06/24/2021 1628 by Deliah Boston, RN Outcome: Progressing   Problem: Activity: Goal: Risk for activity intolerance will decrease 06/24/2021 1631 by Deliah Boston, RN Outcome: Progressing 06/24/2021 1628 by Deliah Boston, RN Outcome: Progressing   Problem: Nutrition: Goal: Adequate nutrition will be maintained 06/24/2021 1631 by Deliah Boston, RN Outcome: Progressing 06/24/2021 1628 by Deliah Boston, RN Outcome: Progressing   Problem: Coping: Goal: Level of anxiety will decrease 06/24/2021 1631 by Deliah Boston, RN Outcome: Progressing 06/24/2021 1628 by Deliah Boston, RN Outcome: Progressing   Problem: Elimination: Goal: Will not experience complications related to bowel motility 06/24/2021 1631 by Deliah Boston, RN Outcome: Progressing 06/24/2021 1628 by Deliah Boston, RN Outcome: Progressing Goal: Will not experience complications related to urinary retention 06/24/2021 1631 by Deliah Boston, RN Outcome: Progressing 06/24/2021 1628 by Deliah Boston, RN Outcome: Progressing   Problem: Pain Managment: Goal: General experience of comfort will improve 06/24/2021 1631 by Deliah Boston, RN Outcome: Progressing 06/24/2021 1628 by Deliah Boston, RN Outcome: Progressing   Problem: Safety: Goal: Ability to remain free from injury will improve 06/24/2021 1631 by Deliah Boston, RN Outcome: Progressing 06/24/2021 1628 by Deliah Boston, RN Outcome: Progressing   Problem: Skin Integrity: Goal: Risk for impaired skin integrity will decrease 06/24/2021 1631 by Deliah Boston, RN Outcome: Progressing 06/24/2021 1628 by Deliah Boston, RN Outcome: Progressing   Problem: Education: Goal: Knowledge of disease or condition will improve 06/24/2021 1631 by Deliah Boston, RN Outcome: Progressing 06/24/2021 1628 by Deliah Boston, RN Outcome: Progressing Goal: Knowledge of secondary prevention will improve 06/24/2021 1631 by Deliah Boston, RN Outcome: Progressing 06/24/2021 1628 by Deliah Boston, RN Outcome: Progressing Goal: Knowledge of patient specific risk factors addressed and post discharge goals established will improve 06/24/2021 1631 by Deliah Boston, RN Outcome: Progressing 06/24/2021 1628 by Deliah Boston, RN Outcome: Progressing Goal: Individualized Educational Video(s) 06/24/2021 1631 by Deliah Boston, RN Outcome: Progressing 06/24/2021 1628 by Deliah Boston, RN Outcome: Progressing   Problem: Safety: Goal: Non-violent  Restraint(s) 06/24/2021 1631 by Deliah Boston, RN Outcome: Progressing 06/24/2021 1628 by Deliah Boston, RN Outcome: Progressing

## 2021-06-24 NOTE — Progress Notes (Signed)
NF PROGRESS NOTE    [name] [name]  BTD:[id number] DOB: [birth year] DOA: 05/19/2021 PCP: Pcp, No    Brief Narrative:  [name] [name] is a 60 year old male with past medical history significant for essential hypertension, substance abuse with marijuana/EtOH/crack cocaine who presented to [facility] on 5/25 after being found down by family and police after a welfare check given patient did not show up for work.  Brought in by EMS with patient being very lethargic.  CT head without contrast notable for moderate intraventricular hemorrhage with large SAH that was suspicious of an aneurysm.  Patient was seen emergently at bedside by neurosurgery and underwent external ventriculostomy drain at bedside by Dr. Wynetta Emery.  Patient was initially admitted to the [facility] service.  Patient underwent diagnostic cerebral angiogram by neurosurgery, Dr. Conchita Paris on 05/20/2021 with successful coil embolization of the right A1-A2 junction aneurysm.  Patient then underwent a laparoscopic assisted ventriculoperitoneal shunt placement by Dr. Conchita Paris and Dr. Janee Morn on 06/08/2021.  Patient was transferred to the hospitalist service on 06/10/2021.  Currently medically stable for discharge pending SNF but difficult to place per TOC.   Assessment & Plan:   Active Problems:   Cerebral salt-wasting   Aneurysmal subarachnoid hemorrhage (HCC)   Vasospasm of cerebral artery   Diarrheal stools   Subarachnoid hemorrhage 2/2 ACA aneurysm s/p coil embolization 5/26 Communicating hydrocephalus secondary to [facility] Patient presenting to the ED after being found down at home by family and police on a welfare check given patient did not show up for work.  Patient was noted to be altered, CT head notable for intraventricular hemorrhage with large SAH suspicious for aneurysm.  Seen by neurosurgery emergently at bedside s/p EVD 5/25.  Cerebral angiogram with coil embolization ACA aneurysm on 5/26.  Underwent VP shunt on 6/14.  Completed seizure  prophylactic course of Keppra. Surgical staples of VP shunt removed on 6/29.  Outpatient follow-up with neurosurgery.   Dysphagia Etiology likely secondary to hemorrhagic stroke as above.  Tolerating tube feeds via PEG.  As per speech therapy follow-up, diet has been advanced to dysphagia 3/mechanical soft diet and thin liquids and he seems to be tolerating this.  ST coordinated with RD to see if patient can have oral diet with calorie count in the daytime and switch tube feeds to only at night.  Monitor.  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  Remains on sodium chloride tablets 1 g 3 times daily.  Gradually wean off salt tablets if able.  Hypokalemia Replaced.  Magnesium normal.  Physical deconditioning in the setting of SAH: --Continue PT/OT efforts, TOC for placement  History of polysubstance abuse with EtOH/marijuana/distant history of crack cocaine --Abstinence  DVT prophylaxis: heparin injection 5,000 Units Start: 06/15/21 2230 SCD's Start: 05/19/21 2021   Code Status: Full Code Family Communication: Discussed in detail with patient's mother at bedside on 6/29, updated care and answered all questions.  Remains inpatient appropriate because:Unsafe d/c plan  Dispo: The patient is from: Home              Anticipated d/c is to: SNF              Patient currently is medically stable to d/c.   Difficult to place patient Yes   Significant Events: 5/25 Admitted with [facility] secondary to ruptured aneurysm, ventriculostomy preformed in ED, Ancef started 5/26 Coil Embolization 5/29 worsening neurological exam consistent with vasospasm, hemodynamic augmentation initiated. 6/1 worsening hyponatremia: Na of 120.  Patient started on salt tablets 1  g twice daily and fludrocortisone 0.1 mg. 6/2 BP goal reduced 160 -180.  Salt Tablet increased to 1 g tid. Fludrocortisone increased 0.2 mg. 6/8 Ongoing agitation, intermittently following commands. CT Head with worsening  hydrocephalus despite EVD, open to 10 per NSGY. 6/9 EVD remains in place, repeat head CT as below 6/10-12, no significant neuro changes, intermittently agitated.  Ongoing EVD, plans for VP shunt next week 6/13 no neuro changes, remains intermittently restless 6/14 went for VP shunt, no neuro changes, afebrile 6/21 PEG tube placement by IR    Consultants:  PCCM Neurosurgery General surgery -assisted neurosurgery with laparoscopic portion of VP shunt placement  Procedures:  Bedside EVD by Dr. Wynetta Emery on 5/25 ACA coil embolization 5/26, Dr. Conchita Paris VP shunt placement 6/14, Dr. Conchita Paris and Dr. Janee Morn PEG tube placement by IR 6/21, Dr. Grace Isaac  Antimicrobials:  Cefazolin 5/25 - 6/2, 6/14 - 6/15, 6/21 - 6/22,    Subjective: Seen this morning.  Appeared alert but eyes closed.  Did not open eyes despite repeated calling, touching and even noxious stimuli.  However doing purposeful movements with his hands.  Moving all extremities symmetrically.  Intermittently makes some grimacing faces but did not appear in overt pain.  Objective: Vitals:   06/24/21 0311 06/24/21 0748 06/24/21 1153 06/24/21 1609  BP: (!) 136/91 (!) 138/97 119/84 (!) 133/95  Pulse: 82 79 77 79  Resp: 18 16 16 16   Temp: 98 F (36.7 C) 97.8 F (36.6 C) 97.6 F (36.4 C) 97.6 F (36.4 C)  TempSrc:  Oral Oral Oral  SpO2: 100% 100% 100% 100%  Weight:      Height:        Intake/Output Summary (Last 24 hours) at 06/24/2021 1829 Last data filed at 06/24/2021 1614 Gross per 24 hour  Intake --  Output 2100 ml  Net -2100 ml   Filed Weights   06/22/21 0500 06/23/21 0500 06/24/21 0228  Weight: 61 kg 58.4 kg 58.2 kg    Examination:  General exam: Appears calm and comfortable, chronically ill in appearance, appears older than stated age HEENT: Scalp staples removed by RN this morning. Respiratory system: Clear to auscultation.  No increased work of breathing. Cardiovascular system: S1 and S2 heard, RRR.  No JVD,  murmurs or pedal edema. Gastrointestinal system: Abdomen is nondistended, soft and nontender. No organomegaly or masses felt. Normal bowel sounds heard. Central nervous system: Mental status as noted above.  Nonverbal.  No focal neurological deficits. Extremities: Spontaneously moves all limbs. Skin: No rashes, lesions or ulcers Psychiatry: Judgement and insight cannot be assessed. Mood & affect cannot be assessed    Data Reviewed: I have personally reviewed following labs and imaging studies  CBC: No results for input(s): WBC, NEUTROABS, HGB, HCT, MCV, PLT in the last 168 hours.  Basic Metabolic Panel: Recent Labs  Lab 06/23/21 0327  NA 142  K 3.7  CL 107  CO2 28  GLUCOSE 114*  BUN 15  CREATININE 0.59*  CALCIUM 9.8  MG 2.0   GFR: Estimated Creatinine Clearance: 81.8 mL/min (A) (by C-G formula based on SCr of 0.59 mg/dL (L)).  CBG: Recent Labs  Lab 06/24/21 0009 06/24/21 0310 06/24/21 0745 06/24/21 1155 06/24/21 1611  GLUCAP 115* 109* 124* 107* 128*      Radiology Studies: No results found.      Scheduled Meds:  chlorhexidine  15 mL Mouth Rinse BID   Chlorhexidine Gluconate Cloth  6 each Topical Daily   feeding supplement (PROSource TF)  45 mL  Per Tube BID   fiber  1 packet Per Tube BID   free water  100 mL Per Tube Q4H   heparin injection (subcutaneous)  5,000 Units Subcutaneous Q8H   mouth rinse  15 mL Mouth Rinse q12n4p   nystatin  1 application Topical TID   sodium chloride  1 g Per Tube BID WC   Continuous Infusions:  feeding supplement (JEVITY 1.2 CAL) 1,560 mL (06/24/21 0605)     LOS: 36 days   Marcellus Scott, MD, [place], [facility]. [facility]  To contact the attending provider between 7A-7P or the covering provider during after hours 7P-7A, please log into the web site [email or web address] and access using universal Avis password for that web site. If you do not have the password, please call the hospital operator.

## 2021-06-24 NOTE — Progress Notes (Signed)
  Speech Language Pathology Treatment: Dysphagia;Cognitive-Linquistic  Patient Details Name: [name] [name] MRN: [id number] DOB: Jan 13, 1961 Today's Date: 06/24/2021 Time: 2836-6294 SLP Time Calculation (min) (ACUTE ONLY): 23 min  Assessment / Plan / Recommendation Clinical Impression  Followed up for dysphagia and cognitive intervention. Pt recently advanced to mechanical soft diet by SLP 6/28; continues to tolerate trials well with SLP at bedside. With cueing pt able to self feed finger food snacks and consume thin liquids by both cup and straw. Pt does continue to have some nonsensical verbal output with ongoing confusion, though responding well to cueing from SLP. Following PO snacks pt stated, "That was good, I needed that". Coordinated with MD, RD for consideration and candidacy for nocturnal feeds, calorie count, oral supplements to reduce dependence of feed as pt mentation and diet advancement continues to improve. Pt required moderate cues for following simple commands during self feeding tasks. SLP to continue to follow.   HPI HPI: Pt is a 42 you w/ PMH sinusitis, rotator cuff tear, tobacco, marijuana, and ETOH use, remote hx of crack cocaine use who is being managed for parenchymal hemorrhage within the anterior  left frontal lobe. Mild surrounding edema within the anterior left  frontal lobe, now s/p ventriculostomy. Ventric removed and shunt placed 6/14.      SLP Plan  Continue with current plan of care       Recommendations  Diet recommendations: Dysphagia 3 (mechanical soft);Thin liquid Liquids provided via: Cup;Straw Medication Administration: Crushed with puree Supervision: Full supervision/cueing for compensatory strategies;Staff to assist with self feeding Compensations: Slow rate;Small sips/bites;Follow solids with liquid Postural Changes and/or Swallow Maneuvers: Seated upright 90 degrees;Upright 30-60 min after meal                Oral Care Recommendations: Oral  care BID Follow up Recommendations: Skilled Nursing facility SLP Visit Diagnosis: Dysphagia, oral phase (R13.11);Dysphagia, unspecified (R13.10);Cognitive communication deficit (R41.841) Plan: Continue with current plan of care       GO                Ardyth Gal MA, CCC-SLP Acute Rehabilitation Services   06/24/2021, 3:57 PM

## 2021-06-25 LAB — GLUCOSE, CAPILLARY
Glucose-Capillary: 100 mg/dL — ABNORMAL HIGH (ref 70–99)
Glucose-Capillary: 118 mg/dL — ABNORMAL HIGH (ref 70–99)
Glucose-Capillary: 122 mg/dL — ABNORMAL HIGH (ref 70–99)
Glucose-Capillary: 123 mg/dL — ABNORMAL HIGH (ref 70–99)
Glucose-Capillary: 128 mg/dL — ABNORMAL HIGH (ref 70–99)
Glucose-Capillary: 135 mg/dL — ABNORMAL HIGH (ref 70–99)
Glucose-Capillary: 97 mg/dL (ref 70–99)

## 2021-06-25 NOTE — Progress Notes (Signed)
Occupational Therapy Treatment Patient Details Name: [name] [name] MRN: [id number] DOB: 09-09-1961 Today's Date: 06/25/2021    History of present illness 60 y.o. M who was admitted to ICU 05/19/21 with IVH with large acute SAH now s/p ventriculostomy. 5/26 coil embolization. 5/29 worsening neurological exam consistent with vasospasm. VP shunt placement 6/14. Significant PMH: rotator cuff tear, tobacco, marijuana and ETOH use, remote hx crack cocaine use.   OT comments  Pt continues to present with decreased cognition, coordination, balance, strength, and activity tolerance. Pt tolerating sitting at EOB to perform self feeding task for ~15 minutes with Min-Mod A for sitting balance support. Pt continues to present with decreased sitting balance, cognition, and activity tolerance. Pt tangential and easily distractible. Pt requiring Max A for sit<>stand at EOB. Continue to recommend dc to SNF and will continue to follow acutely as admitted.    Follow Up Recommendations  SNF    Equipment Recommendations  Wheelchair (measurements OT);Wheelchair cushion (measurements OT);Hospital bed    Recommendations for Other Services      Precautions / Restrictions Precautions Precautions: Fall;Other (comment) Precaution Comments: safety mitts, PEG       Mobility Bed Mobility Overal bed mobility: Needs Assistance Bed Mobility: Supine to Sit;Sit to Supine     Supine to sit: Min assist Sit to supine: Max assist;+2 for physical assistance   General bed mobility comments: Min A for elevating trunk to sit at EOB. Max A +2 for managing trunk and BLEs to lower back to supine    Transfers Overall transfer level: Needs assistance Equipment used: 2 person hand held assist Transfers: Sit to/from [employer] Sit to Stand: Max assist;+2 physical assistance         General transfer comment: Max A +2 to power up and weight shift forward. Difficulty achieving full upright posture     Balance Overall balance assessment: Needs assistance Sitting-balance support: No upper extremity supported;Feet supported Sitting balance-Leahy Scale: Poor Sitting balance - Comments: slight posterior lean; increased with fatigue. Requiring Min-Mod A for support during sitting Postural control: Posterior lean;Right lateral lean Standing balance support: Bilateral upper extremity supported;During functional activity Standing balance-Leahy Scale: Zero Standing balance comment: max +2                           ADL either performed or assessed with clinical judgement   ADL Overall ADL's : Needs assistance/impaired Eating/Feeding: Sitting;Minimal assistance;Moderate assistance Eating/Feeding Details (indicate cue type and reason): Min-Mod A for support at knees for maintaining sitting balance. Pt eating mashpotatoes  with a spoon and tolerating sitting ~15 min.                     Toilet Transfer: Maximal assistance;+2 for safety/equipment Toilet Transfer Details (indicate cue type and reason): sit<>stand at EOB with Max A to transition hips towards [facility]         Functional mobility during ADLs: +2 for safety/equipment;Maximal assistance (sit<>stand) General ADL Comments: Focused session on sitting balance, activity tolerance, and participation in simple ADL. Pt performing self feeding and grooming     Vision   Vision Assessment?: Vision impaired- to be further tested in functional context Eye Alignment: Within Functional Limits Ocular Range of Motion: Restricted on the left;Impaired-to be further tested in functional context Alignment/Gaze Preference: Other (comment) (gaze right or midline; assist to turn head to L) Tracking/Visual Pursuits: Left eye does not track laterally;Impaired - to be further tested in functional context  Saccades: Undershoots;Impaired - to be further tested in functional context   Perception     Praxis      Cognition Arousal/Alertness:  Awake/alert Behavior During Therapy: Flat affect Overall Cognitive Status: Impaired/Different from baseline Area of Impairment: Orientation;Attention;Following commands;Safety/judgement;Awareness;Problem solving;Memory                 Orientation Level: Disoriented to;Place;Time;Situation;Person Current Attention Level: Sustained Memory: Decreased short-term memory Following Commands: Follows one step commands inconsistently;Follows one step commands with increased time Safety/Judgement: Decreased awareness of safety;Decreased awareness of deficits Awareness: Intellectual Problem Solving: Slow processing;Difficulty sequencing;Requires verbal cues;Requires tactile cues General Comments: Pt very tangiental in conversation. Tendency to verbalize paranoid thoughts such as "getting out of here", "you girls mean business," and "we killed em all. all of them." However, easily redirectable.Both internally and externally distractable.        Exercises     Shoulder Instructions       General Comments Playing jazz music during session    Pertinent Vitals/ Pain       Pain Assessment: Faces Faces Pain Scale: No hurt Pain Location: general discomfort Pain Descriptors / Indicators: Restless Pain Intervention(s): Monitored during session;Limited activity within patient's tolerance;Repositioned  Home Living                                          Prior Functioning/Environment              Frequency  Min 2X/week        Progress Toward Goals  OT Goals(current goals can now be found in the care plan section)  Progress towards OT goals: Progressing toward goals  Acute Rehab OT Goals Patient Stated Goal: unable to state OT Goal Formulation: Patient unable to participate in goal setting Time For Goal Achievement: 06/29/21 Potential to Achieve Goals: Good ADL Goals Pt Will Perform Grooming: with min assist;sitting Pt Will Perform Lower Body Dressing: with  mod assist;sitting/lateral leans;sit to/from stand Pt Will Perform Toileting - Clothing Manipulation and hygiene: with mod assist;sit to/from stand Pt/caregiver will Perform Home Exercise Program: Increased ROM;Increased strength;Both right and left upper extremity;With minimal assist;With written HEP provided Additional ADL Goal #1: Pt will follow 1-2 step commands with minimal cues to attend to task in 3/5 trials. Additional ADL Goal #2: Pt will utilize strategies for L inattention in order to participate with tasks on L side with minimal cues in 3/5 trials.  Plan Discharge plan remains appropriate    Co-evaluation                 AM-PAC OT "6 Clicks" Daily Activity     Outcome Measure   Help from another person eating meals?: Total Help from another person taking care of personal grooming?: A Lot Help from another person toileting, which includes using toliet, bedpan, or urinal?: Total Help from another person bathing (including washing, rinsing, drying)?: Total Help from another person to put on and taking off regular upper body clothing?: Total Help from another person to put on and taking off regular lower body clothing?: Total 6 Click Score: 7    End of Session    OT Visit Diagnosis: Unsteadiness on feet (R26.81);Muscle weakness (generalized) (M62.81);Other symptoms and signs involving cognitive function   Activity Tolerance Patient tolerated treatment well   Patient Left in bed;with call bell/phone within reach;with bed alarm set;with restraints reapplied;with SCD's reapplied   Nurse  Communication Mobility status (Continued BM)        Time: 3212-2482 OT Time Calculation (min): 29 min  Charges: OT General Charges $OT Visit: 1 Visit OT Treatments $Self Care/Home Management : 23-37 mins  Yuette Putnam MSOT, OTR/L Acute Rehab Pager: [phone] Office: [phone]   Theodoro Grist Ameera Tigue 06/25/2021, 3:39 PM

## 2021-06-25 NOTE — Progress Notes (Signed)
NF PROGRESS NOTE    [name] [name]  NOM:[id number] DOB: May 19, 1961 DOA: 05/19/2021 PCP: Pcp, No    Brief Narrative:  [name] [name] is a 60 year old male with past medical history significant for essential hypertension, substance abuse with marijuana/EtOH/crack cocaine who presented to [facility] on 5/25 after being found down by family and police after a welfare check given patient did not show up for work.  Brought in by EMS with patient being very lethargic.  CT head without contrast notable for moderate intraventricular hemorrhage with large SAH that was suspicious of an aneurysm.  Patient was seen emergently at bedside by neurosurgery and underwent external ventriculostomy drain at bedside by Dr. Wynetta Emery.  Patient was initially admitted to the [facility] service.  Patient underwent diagnostic cerebral angiogram by neurosurgery, Dr. Conchita Paris on 05/20/2021 with successful coil embolization of the right A1-A2 junction aneurysm.  Patient then underwent a laparoscopic assisted ventriculoperitoneal shunt placement by Dr. Conchita Paris and Dr. Janee Morn on 06/08/2021.  Patient was transferred to the hospitalist service on 06/10/2021.  Currently medically stable for discharge pending SNF but difficult to place per TOC.   Assessment & Plan:   Active Problems:   Cerebral salt-wasting   Aneurysmal subarachnoid hemorrhage (HCC)   Vasospasm of cerebral artery   Diarrheal stools   Subarachnoid hemorrhage 2/2 ACA aneurysm s/p coil embolization 5/26 Communicating hydrocephalus secondary to [facility] Patient presenting to the ED after being found down at home by family and police on a welfare check given patient did not show up for work.  Patient was noted to be altered, CT head notable for intraventricular hemorrhage with large SAH suspicious for aneurysm.  Seen by neurosurgery emergently at bedside s/p EVD 5/25.  Cerebral angiogram with coil embolization ACA aneurysm on 5/26.  Underwent VP shunt on 6/14.  Completed seizure  prophylactic course of Keppra. Surgical staples of VP shunt removed on 6/29.  Outpatient follow-up with neurosurgery.  Appears to have chronic mental status changes/encephalopathy now.   Dysphagia Etiology likely secondary to hemorrhagic stroke as above.  Tolerating tube feeds via PEG.  As per speech therapy follow-up, diet has been advanced to dysphagia 3/mechanical soft diet and thin liquids and he seems to be tolerating this.  ST coordinated with RD to see if patient can have oral diet with calorie count in the daytime and switch tube feeds to only at night.  Monitor.  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  Remains on sodium chloride tablets 1 g 3 times daily.  Gradually wean off salt tablets if able.  Hypokalemia Replaced.  Magnesium normal.  Physical deconditioning in the setting of SAH: --Continue PT/OT efforts, TOC for placement  History of polysubstance abuse with EtOH/marijuana/distant history of crack cocaine --Abstinence  DVT prophylaxis: heparin injection 5,000 Units Start: 06/15/21 2230 SCD's Start: 05/19/21 2021   Code Status: Full Code Family Communication: Discussed in detail with patient's mother at bedside on 6/29, updated care and answered all questions.  Remains inpatient appropriate because:Unsafe d/c plan  Dispo: The patient is from: Home              Anticipated d/c is to: SNF              Patient currently is medically stable to d/c.   Difficult to place patient Yes   Significant Events: 5/25 Admitted with [facility] secondary to ruptured aneurysm, ventriculostomy preformed in ED, Ancef started 5/26 Coil Embolization 5/29 worsening neurological exam consistent with vasospasm, hemodynamic augmentation initiated. 6/1 worsening hyponatremia: Na  of 120.  Patient started on salt tablets 1 g twice daily and fludrocortisone 0.1 mg. 6/2 BP goal reduced 160 -180.  Salt Tablet increased to 1 g tid. Fludrocortisone increased 0.2 mg. 6/8 Ongoing  agitation, intermittently following commands. CT Head with worsening hydrocephalus despite EVD, open to 10 per NSGY. 6/9 EVD remains in place, repeat head CT as below 6/10-12, no significant neuro changes, intermittently agitated.  Ongoing EVD, plans for VP shunt next week 6/13 no neuro changes, remains intermittently restless 6/14 went for VP shunt, no neuro changes, afebrile 6/21 PEG tube placement by IR    Consultants:  PCCM Neurosurgery General surgery -assisted neurosurgery with laparoscopic portion of VP shunt placement  Procedures:  Bedside EVD by Dr. Wynetta Emery on 5/25 ACA coil embolization 5/26, Dr. Conchita Paris VP shunt placement 6/14, Dr. Conchita Paris and Dr. Janee Morn PEG tube placement by IR 6/21, Dr. Grace Isaac  Antimicrobials:  Cefazolin 5/25 - 6/2, 6/14 - 6/15, 6/21 - 6/22,    Subjective: Briefly opened eyes this morning but nonverbal and does not follow instructions.  Keeps intermittently fidgeting in bed and had seen him lying almost horizontally in bed.  As per RN, does respond at times but is slow to respond.  Objective: Vitals:   06/25/21 0339 06/25/21 0800 06/25/21 1219 06/25/21 1532  BP: (!) 133/92  127/84 129/87  Pulse: 85  100 85  Resp: 18 18 18 16   Temp: 97.8 F (36.6 C) 97.7 F (36.5 C) 98 F (36.7 C) 98.2 F (36.8 C)  TempSrc: Oral Oral Oral Oral  SpO2:   100%   Weight: 60.3 kg     Height:        Intake/Output Summary (Last 24 hours) at 06/25/2021 1747 Last data filed at 06/25/2021 1219 Gross per 24 hour  Intake --  Output 1100 ml  Net -1100 ml   Filed Weights   06/23/21 0500 06/24/21 0228 06/25/21 0339  Weight: 58.4 kg 58.2 kg 60.3 kg    Examination:  General exam: Appears calm and comfortable, chronically ill in appearance, appears older than stated age HEENT: Scalp staples removed by RN this morning. Respiratory system: Clear to auscultation.  No increased work of breathing. Cardiovascular system: S1 and S2 heard, RRR.  No JVD, murmurs or pedal  edema. Gastrointestinal system: Abdomen is nondistended, soft and nontender. No organomegaly or masses felt. Normal bowel sounds heard. Central nervous system: Mental status as noted above.  Nonverbal.  No focal neurological deficits. Extremities: Spontaneously moves all limbs. Skin: No rashes, lesions or ulcers Psychiatry: Judgement and insight cannot be assessed. Mood & affect cannot be assessed    Data Reviewed: I have personally reviewed following labs and imaging studies  CBC: No results for input(s): WBC, NEUTROABS, HGB, HCT, MCV, PLT in the last 168 hours.  Basic Metabolic Panel: Recent Labs  Lab 06/23/21 0327  NA 142  K 3.7  CL 107  CO2 28  GLUCOSE 114*  BUN 15  CREATININE 0.59*  CALCIUM 9.8  MG 2.0   GFR: Estimated Creatinine Clearance: 84.8 mL/min (A) (by C-G formula based on SCr of 0.59 mg/dL (L)).  CBG: Recent Labs  Lab 06/25/21 0415 06/25/21 0623 06/25/21 0808 06/25/21 1217 06/25/21 1532  GLUCAP 118* 123* 97 135* 122*      Radiology Studies: No results found.      Scheduled Meds:  chlorhexidine  15 mL Mouth Rinse BID   Chlorhexidine Gluconate Cloth  6 each Topical Daily   feeding supplement  1 Container Oral TID  BM   feeding supplement (JEVITY 1.5 CAL/FIBER)  900 mL Per Tube Q24H   feeding supplement (PROSource TF)  45 mL Per Tube BID   fiber  1 packet Per Tube BID   free water  100 mL Per Tube Q4H   heparin injection (subcutaneous)  5,000 Units Subcutaneous Q8H   mouth rinse  15 mL Mouth Rinse q12n4p   nystatin  1 application Topical TID   sodium chloride  1 g Per Tube BID WC   Continuous Infusions:     LOS: 37 days   Marcellus Scott, MD, [place], [facility]. [facility]  To contact the attending provider between 7A-7P or the covering provider during after hours 7P-7A, please log into the web site [email or web address] and access using universal [place] password for that web site. If you do not have the password, please call the hospital  operator.

## 2021-06-25 NOTE — Plan of Care (Signed)

## 2021-06-25 NOTE — Plan of Care (Signed)
  Problem: Education: Goal: Knowledge of General Education information will improve Description: Including pain rating scale, medication(s)/side effects and non-pharmacologic comfort measures Outcome: Progressing   Problem: Health Behavior/Discharge Planning: Goal: Ability to manage health-related needs will improve Outcome: Progressing   Problem: Clinical Measurements: Goal: Ability to maintain clinical measurements within normal limits will improve Outcome: Progressing Goal: Will remain free from infection Outcome: Progressing Goal: Diagnostic test results will improve Outcome: Progressing Goal: Respiratory complications will improve Outcome: Progressing Goal: Cardiovascular complication will be avoided Outcome: Progressing   Problem: Activity: Goal: Risk for activity intolerance will decrease Outcome: Progressing   Problem: Nutrition: Goal: Adequate nutrition will be maintained Outcome: Progressing   Problem: Coping: Goal: Level of anxiety will decrease Outcome: Progressing   Problem: Elimination: Goal: Will not experience complications related to bowel motility Outcome: Progressing Goal: Will not experience complications related to urinary retention Outcome: Progressing   Problem: Pain Managment: Goal: General experience of comfort will improve Outcome: Progressing   Problem: Safety: Goal: Ability to remain free from injury will improve Outcome: Progressing   Problem: Skin Integrity: Goal: Risk for impaired skin integrity will decrease Outcome: Progressing   Problem: Education: Goal: Knowledge of disease or condition will improve Outcome: Progressing Goal: Knowledge of secondary prevention will improve Outcome: Progressing Goal: Knowledge of patient specific risk factors addressed and post discharge goals established will improve Outcome: Progressing Goal: Individualized Educational Video(s) Outcome: Progressing   Problem: Safety: Goal: Non-violent  Restraint(s) Outcome: Progressing   

## 2021-06-26 LAB — GLUCOSE, CAPILLARY
Glucose-Capillary: 105 mg/dL — ABNORMAL HIGH (ref 70–99)
Glucose-Capillary: 92 mg/dL (ref 70–99)
Glucose-Capillary: 102 mg/dL — ABNORMAL HIGH (ref 70–99)
Glucose-Capillary: 135 mg/dL — ABNORMAL HIGH (ref 70–99)

## 2021-06-26 MED ORDER — CAMPHOR-MENTHOL 0.5-0.5 % EX LOTN
TOPICAL_LOTION | CUTANEOUS | Status: DC | PRN
  Filled 2021-06-26: qty 222

## 2021-06-26 NOTE — Progress Notes (Signed)
NF PROGRESS NOTE    [name] [name]  XHB:[id number] DOB: 11/03/1961 DOA: 05/19/2021 PCP: Pcp, No    Brief Narrative:  [name] [name] is a 60 year old male with past medical history significant for essential hypertension, substance abuse with marijuana/EtOH/crack cocaine who presented to [facility] on 5/25 after being found down by family and police after a welfare check given patient did not show up for work.  Brought in by EMS with patient being very lethargic.  CT head without contrast notable for moderate intraventricular hemorrhage with large SAH that was suspicious of an aneurysm.  Patient was seen emergently at bedside by neurosurgery and underwent external ventriculostomy drain at bedside by Dr. Wynetta Emery.  Patient was initially admitted to the [facility] service.  Patient underwent diagnostic cerebral angiogram by neurosurgery, Dr. Conchita Paris on 05/20/2021 with successful coil embolization of the right A1-A2 junction aneurysm.  Patient then underwent a laparoscopic assisted ventriculoperitoneal shunt placement by Dr. Conchita Paris and Dr. Janee Morn on 06/08/2021.  Patient was transferred to the hospitalist service on 06/10/2021.  Currently medically stable for discharge pending SNF but difficult to place per TOC.  Has been medically optimized for DC to SNF.   Assessment & Plan:   Active Problems:   Cerebral salt-wasting   Aneurysmal subarachnoid hemorrhage (HCC)   Vasospasm of cerebral artery   Diarrheal stools   Subarachnoid hemorrhage 2/2 ACA aneurysm s/p coil embolization 5/26 Communicating hydrocephalus secondary to [facility] Patient presenting to the ED after being found down at home by family and police on a welfare check given patient did not show up for work.  Patient was noted to be altered, CT head notable for intraventricular hemorrhage with large SAH suspicious for aneurysm.  Seen by neurosurgery emergently at bedside s/p EVD 5/25.  Cerebral angiogram with coil embolization ACA aneurysm on 5/26.   Underwent VP shunt on 6/14.  Completed seizure prophylactic course of Keppra. Surgical staples of VP shunt removed on 6/29.  Outpatient follow-up with neurosurgery.  Appears to have chronic mental status changes/encephalopathy now.  Alert and somewhat interactive today.   Dysphagia Etiology likely secondary to hemorrhagic stroke as above.  Tolerating tube feeds via PEG.  As per speech therapy follow-up, diet has been advanced to dysphagia 3/mechanical soft diet and thin liquids and he seems to be tolerating this.  ST coordinated with RD to see if patient can have oral diet with calorie count in the daytime and switch tube feeds to only at night.  Monitor.  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  Remains on sodium chloride tablets 1 g 3 times daily.  Gradually wean off salt tablets if able.  Hypokalemia Replaced.  Magnesium normal.  Physical deconditioning in the setting of SAH: --Continue PT/OT efforts, TOC for placement  History of polysubstance abuse with EtOH/marijuana/distant history of crack cocaine --Abstinence  DVT prophylaxis: heparin injection 5,000 Units Start: 06/15/21 2230 SCD's Start: 05/19/21 2021   Code Status: Full Code Family Communication: Discussed in detail with patient's mother at bedside on 6/29, updated care and answered all questions.  Remains inpatient appropriate because:Unsafe d/c plan  Dispo: The patient is from: Home              Anticipated d/c is to: SNF              Patient currently is medically stable to d/c.   Difficult to place patient Yes   Significant Events: 5/25 Admitted with [facility] secondary to ruptured aneurysm, ventriculostomy preformed in ED, Ancef started 5/26 Coil  Embolization 5/29 worsening neurological exam consistent with vasospasm, hemodynamic augmentation initiated. 6/1 worsening hyponatremia: Na of 120.  Patient started on salt tablets 1 g twice daily and fludrocortisone 0.1 mg. 6/2 BP goal reduced 160 -180.   Salt Tablet increased to 1 g tid. Fludrocortisone increased 0.2 mg. 6/8 Ongoing agitation, intermittently following commands. CT Head with worsening hydrocephalus despite EVD, open to 10 per NSGY. 6/9 EVD remains in place, repeat head CT as below 6/10-12, no significant neuro changes, intermittently agitated.  Ongoing EVD, plans for VP shunt next week 6/13 no neuro changes, remains intermittently restless 6/14 went for VP shunt, no neuro changes, afebrile 6/21 PEG tube placement by IR    Consultants:  PCCM Neurosurgery General surgery -assisted neurosurgery with laparoscopic portion of VP shunt placement  Procedures:  Bedside EVD by Dr. Wynetta Emery on 5/25 ACA coil embolization 5/26, Dr. Conchita Paris VP shunt placement 6/14, Dr. Conchita Paris and Dr. Janee Morn PEG tube placement by IR 6/21, Dr. Grace Isaac  Antimicrobials:  Cefazolin 5/25 - 6/2, 6/14 - 6/15, 6/21 - 6/22,    Subjective: Alert and smiling as I walked into the room.  RN doing tube feeds.  This is the first time I have seen him awake and alert in the last 4 days.  Says his name is "[name] [name]".  No complaints reported  Objective: Vitals:   06/26/21 0406 06/26/21 0827 06/26/21 1246 06/26/21 1552  BP: 110/83 107/87 107/63 (!) 137/96  Pulse: 85 75 79 90  Resp: 16 16 18 16   Temp: 97.6 F (36.4 C) 98.1 F (36.7 C) 98.1 F (36.7 C) 98.3 F (36.8 C)  TempSrc: Oral Oral Oral   SpO2: 100% 100% 99% 100%  Weight:      Height:        Intake/Output Summary (Last 24 hours) at 06/26/2021 1607 Last data filed at 06/26/2021 1200 Gross per 24 hour  Intake 0 ml  Output 725 ml  Net -725 ml   Filed Weights   06/23/21 0500 06/24/21 0228 06/25/21 0339  Weight: 58.4 kg 58.2 kg 60.3 kg    Examination:  General exam: Appears calm and comfortable, chronically ill in appearance, appears older than stated age HEENT: Scalp staples removed by RN this morning. Respiratory system: Clear to auscultation.  No increased work of breathing. Cardiovascular  system: S1 and S2 heard, RRR.  No JVD, murmurs or pedal edema. Gastrointestinal system: Abdomen is nondistended, soft and nontender. No organomegaly or masses felt. Normal bowel sounds heard. Central nervous system: Mental status as noted above.  No focal neurological deficits. Extremities: Spontaneously moves all limbs. Skin: No rashes, lesions or ulcers Psychiatry: Judgement and insight cannot be assessed. Mood & affect cannot be assessed    Data Reviewed: I have personally reviewed following labs and imaging studies  CBC: No results for input(s): WBC, NEUTROABS, HGB, HCT, MCV, PLT in the last 168 hours.  Basic Metabolic Panel: Recent Labs  Lab 06/23/21 0327  NA 142  K 3.7  CL 107  CO2 28  GLUCOSE 114*  BUN 15  CREATININE 0.59*  CALCIUM 9.8  MG 2.0   GFR: Estimated Creatinine Clearance: 84.8 mL/min (A) (by C-G formula based on SCr of 0.59 mg/dL (L)).  CBG: Recent Labs  Lab 06/25/21 1532 06/25/21 2024 06/25/21 2335 06/26/21 0413 06/26/21 1158  GLUCAP 122* 128* 100* 105* 92      Radiology Studies: No results found.      Scheduled Meds:  chlorhexidine  15 mL Mouth Rinse BID   Chlorhexidine Gluconate  Cloth  6 each Topical Daily   feeding supplement  1 Container Oral TID BM   feeding supplement (JEVITY 1.5 CAL/FIBER)  900 mL Per Tube Q24H   feeding supplement (PROSource TF)  45 mL Per Tube BID   fiber  1 packet Per Tube BID   free water  100 mL Per Tube Q4H   heparin injection (subcutaneous)  5,000 Units Subcutaneous Q8H   mouth rinse  15 mL Mouth Rinse q12n4p   nystatin  1 application Topical TID   sodium chloride  1 g Per Tube BID WC   Continuous Infusions:     LOS: 38 days   Marcellus Scott, MD, [place], [facility]. [facility]  To contact the attending provider between 7A-7P or the covering provider during after hours 7P-7A, please log into the web site [email or web address] and access using universal [place] password for that web site. If you do not  have the password, please call the hospital operator.

## 2021-06-26 NOTE — Plan of Care (Signed)
  Problem: Education: Goal: Knowledge of General Education information will improve Description: Including pain rating scale, medication(s)/side effects and non-pharmacologic comfort measures Outcome: Progressing   Problem: Health Behavior/Discharge Planning: Goal: Ability to manage health-related needs will improve Outcome: Progressing   Problem: Clinical Measurements: Goal: Ability to maintain clinical measurements within normal limits will improve Outcome: Progressing Goal: Will remain free from infection Outcome: Progressing Goal: Diagnostic test results will improve Outcome: Progressing Goal: Respiratory complications will improve Outcome: Progressing Goal: Cardiovascular complication will be avoided Outcome: Progressing   Problem: Activity: Goal: Risk for activity intolerance will decrease Outcome: Progressing   Problem: Nutrition: Goal: Adequate nutrition will be maintained Outcome: Progressing   Problem: Coping: Goal: Level of anxiety will decrease Outcome: Progressing   Problem: Elimination: Goal: Will not experience complications related to bowel motility Outcome: Progressing Goal: Will not experience complications related to urinary retention Outcome: Progressing   Problem: Pain Managment: Goal: General experience of comfort will improve Outcome: Progressing   Problem: Safety: Goal: Ability to remain free from injury will improve Outcome: Progressing   Problem: Skin Integrity: Goal: Risk for impaired skin integrity will decrease Outcome: Progressing   Problem: Education: Goal: Knowledge of disease or condition will improve Outcome: Progressing Goal: Knowledge of secondary prevention will improve Outcome: Progressing Goal: Knowledge of patient specific risk factors addressed and post discharge goals established will improve Outcome: Progressing Goal: Individualized Educational Video(s) Outcome: Progressing   Problem: Safety: Goal: Non-violent  Restraint(s) Outcome: Progressing

## 2021-06-27 LAB — GLUCOSE, CAPILLARY
Glucose-Capillary: 101 mg/dL — ABNORMAL HIGH (ref 70–99)
Glucose-Capillary: 111 mg/dL — ABNORMAL HIGH (ref 70–99)
Glucose-Capillary: 116 mg/dL — ABNORMAL HIGH (ref 70–99)
Glucose-Capillary: 127 mg/dL — ABNORMAL HIGH (ref 70–99)

## 2021-06-27 NOTE — Progress Notes (Signed)
NF PROGRESS NOTE    [name] [name]  QAS:[id number] DOB: 1961/11/06 DOA: 05/19/2021 PCP: Pcp, No    Brief Narrative:  [name] [name] is a 60 year old male with past medical history significant for essential hypertension, substance abuse with marijuana/EtOH/crack cocaine who presented to [facility] on 5/25 after being found down by family and police after a welfare check given patient did not show up for work.  Brought in by EMS with patient being very lethargic.  CT head without contrast notable for moderate intraventricular hemorrhage with large SAH that was suspicious of an aneurysm.  Patient was seen emergently at bedside by neurosurgery and underwent external ventriculostomy drain at bedside by Dr. Wynetta Emery.  Patient was initially admitted to the [facility] service.  Patient underwent diagnostic cerebral angiogram by neurosurgery, Dr. Conchita Paris on 05/20/2021 with successful coil embolization of the right A1-A2 junction aneurysm.  Patient then underwent a laparoscopic assisted ventriculoperitoneal shunt placement by Dr. Conchita Paris and Dr. Janee Morn on 06/08/2021.  Patient was transferred to the hospitalist service on 06/10/2021.  Currently medically stable for discharge pending SNF but difficult to place per TOC.  Has been medically optimized for DC to SNF.   Assessment & Plan:   Active Problems:   Cerebral salt-wasting   Aneurysmal subarachnoid hemorrhage (HCC)   Vasospasm of cerebral artery   Diarrheal stools   Subarachnoid hemorrhage 2/2 ACA aneurysm s/p coil embolization 5/26 Communicating hydrocephalus secondary to [facility] Patient presenting to the ED after being found down at home by family and police on a welfare check given patient did not show up for work.  Patient was noted to be altered, CT head notable for intraventricular hemorrhage with large SAH suspicious for aneurysm.  Seen by neurosurgery emergently at bedside s/p EVD 5/25.  Cerebral angiogram with coil embolization ACA aneurysm on 5/26.   Underwent VP shunt on 6/14.  Completed seizure prophylactic course of Keppra. Surgical staples of VP shunt removed on 6/29.  Outpatient follow-up with neurosurgery.  Appears to have chronic mental status changes/encephalopathy now.  Back to sleeping this am. Per RN, alert and communicative this morning.   Dysphagia Etiology likely secondary to hemorrhagic stroke as above.  Tolerating tube feeds via PEG.  As per speech therapy follow-up, diet has been advanced to dysphagia 3/mechanical soft diet and thin liquids and he seems to be tolerating this.  ST coordinated with RD to see if patient can have oral diet with calorie count in the daytime and switch tube feeds to only at night.  Monitor.  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  Remains on sodium chloride tablets 1 g 3 times daily.  Gradually wean off salt tablets if able.  Hypokalemia Replaced.  Magnesium normal.  Physical deconditioning in the setting of SAH: --Continue PT/OT efforts, TOC for placement  History of polysubstance abuse with EtOH/marijuana/distant history of crack cocaine --Abstinence  DVT prophylaxis: heparin injection 5,000 Units Start: 06/15/21 2230 SCD's Start: 05/19/21 2021   Code Status: Full Code Family Communication: Discussed in detail with patient's mother at bedside on 6/29, updated care and answered all questions.  Remains inpatient appropriate because:Unsafe d/c plan  Dispo: The patient is from: Home              Anticipated d/c is to: SNF              Patient currently is medically stable to d/c.   Difficult to place patient Yes   Significant Events: 5/25 Admitted with [facility] secondary to ruptured aneurysm, ventriculostomy  preformed in ED, Ancef started 5/26 Coil Embolization 5/29 worsening neurological exam consistent with vasospasm, hemodynamic augmentation initiated. 6/1 worsening hyponatremia: Na of 120.  Patient started on salt tablets 1 g twice daily and fludrocortisone 0.1  mg. 6/2 BP goal reduced 160 -180.  Salt Tablet increased to 1 g tid. Fludrocortisone increased 0.2 mg. 6/8 Ongoing agitation, intermittently following commands. CT Head with worsening hydrocephalus despite EVD, open to 10 per NSGY. 6/9 EVD remains in place, repeat head CT as below 6/10-12, no significant neuro changes, intermittently agitated.  Ongoing EVD, plans for VP shunt next week 6/13 no neuro changes, remains intermittently restless 6/14 went for VP shunt, no neuro changes, afebrile 6/21 PEG tube placement by IR    Consultants:  PCCM Neurosurgery General surgery -assisted neurosurgery with laparoscopic portion of VP shunt placement  Procedures:  Bedside EVD by Dr. Wynetta Emery on 5/25 ACA coil embolization 5/26, Dr. Conchita Paris VP shunt placement 6/14, Dr. Conchita Paris and Dr. Janee Morn PEG tube placement by IR 6/21, Dr. Grace Isaac  Antimicrobials:  Cefazolin 5/25 - 6/2, 6/14 - 6/15, 6/21 - 6/22,    Subjective: Patient sleepy again during my visit but as per RN, was alert and communicative this morning and had breakfast.  Patient's mother voiced concern over the phone regarding some soreness of right upper extremity and was wondering if he fell a few days back-I have not received a fall report.  Also per my exam and RN, patient has been moving right upper extremity without pain or difficulty.  Objective: Vitals:   06/27/21 0036 06/27/21 0404 06/27/21 0745 06/27/21 1159  BP:  120/75 108/65 119/79  Pulse: 87 88 81 81  Resp: 16 16 16 18   Temp: 97.7 F (36.5 C) 97.7 F (36.5 C) 97.9 F (36.6 C) 97.7 F (36.5 C)  TempSrc: Oral Oral Oral Oral  SpO2: 100% 100% 99% 100%  Weight:      Height:        Intake/Output Summary (Last 24 hours) at 06/27/2021 1253 Last data filed at 06/27/2021 08/28/2021 Gross per 24 hour  Intake --  Output 2435 ml  Net -2435 ml   Filed Weights   06/23/21 0500 06/24/21 0228 06/25/21 0339  Weight: 58.4 kg 58.2 kg 60.3 kg    Examination:  General exam: Appears calm  and comfortable, chronically ill in appearance, appears older than stated age HEENT: Scalp staples removed by RN this morning. Respiratory system: Clear to auscultation.  No increased work of breathing. Cardiovascular system: S1 and S2 heard, RRR.  No JVD, murmurs or pedal edema. Gastrointestinal system: Abdomen is nondistended, soft and nontender. No organomegaly or masses felt. Normal bowel sounds heard. Central nervous system: Sleepy but briefly arousable and drifts back to sleep.  No focal neurological deficits. Extremities: Spontaneously moves all limbs. Skin: No rashes, lesions or ulcers Psychiatry: Judgement and insight cannot be assessed. Mood & affect cannot be assessed    Data Reviewed: I have personally reviewed following labs and imaging studies  CBC: No results for input(s): WBC, NEUTROABS, HGB, HCT, MCV, PLT in the last 168 hours.  Basic Metabolic Panel: Recent Labs  Lab 06/23/21 0327  NA 142  K 3.7  CL 107  CO2 28  GLUCOSE 114*  BUN 15  CREATININE 0.59*  CALCIUM 9.8  MG 2.0   GFR: Estimated Creatinine Clearance: 84.8 mL/min (A) (by C-G formula based on SCr of 0.59 mg/dL (L)).  CBG: Recent Labs  Lab 06/26/21 1637 06/26/21 2001 06/27/21 0040 06/27/21 0406 06/27/21 1201  GLUCAP 102* 135* 116* 127* 111*      Radiology Studies: No results found.      Scheduled Meds:  chlorhexidine  15 mL Mouth Rinse BID   Chlorhexidine Gluconate Cloth  6 each Topical Daily   feeding supplement  1 Container Oral TID BM   feeding supplement (JEVITY 1.5 CAL/FIBER)  900 mL Per Tube Q24H   feeding supplement (PROSource TF)  45 mL Per Tube BID   fiber  1 packet Per Tube BID   free water  100 mL Per Tube Q4H   heparin injection (subcutaneous)  5,000 Units Subcutaneous Q8H   mouth rinse  15 mL Mouth Rinse q12n4p   nystatin  1 application Topical TID   sodium chloride  1 g Per Tube BID WC   Continuous Infusions:     LOS: 39 days   Marcellus Scott, MD, [place],  [facility]. [facility]  To contact the attending provider between 7A-7P or the covering provider during after hours 7P-7A, please log into the web site [email or web address] and access using universal [place] password for that web site. If you do not have the password, please call the hospital operator.

## 2021-06-28 LAB — GLUCOSE, CAPILLARY
Glucose-Capillary: 108 mg/dL — ABNORMAL HIGH (ref 70–99)
Glucose-Capillary: 113 mg/dL — ABNORMAL HIGH (ref 70–99)
Glucose-Capillary: 114 mg/dL — ABNORMAL HIGH (ref 70–99)
Glucose-Capillary: 120 mg/dL — ABNORMAL HIGH (ref 70–99)
Glucose-Capillary: 124 mg/dL — ABNORMAL HIGH (ref 70–99)
Glucose-Capillary: 150 mg/dL — ABNORMAL HIGH (ref 70–99)
Glucose-Capillary: 80 mg/dL (ref 70–99)

## 2021-06-28 MED ORDER — SENNOSIDES-DOCUSATE SODIUM 8.6-50 MG PO TABS
1.0000 | ORAL_TABLET | Freq: Every day | ORAL | Status: DC
  Administered 2021-06-28 – 2021-07-01 (×4): 1
  Filled 2021-06-28 (×4): qty 1

## 2021-06-28 MED ORDER — POLYETHYLENE GLYCOL 3350 17 G PO PACK
17.0000 g | PACK | Freq: Every day | ORAL | Status: DC
  Administered 2021-06-28 – 2021-07-05 (×8): 17 g
  Filled 2021-06-28 (×7): qty 1

## 2021-06-28 MED ORDER — OSMOLITE 1.5 CAL PO LIQD
395.0000 mL | Freq: Three times a day (TID) | ORAL | Status: DC
  Administered 2021-06-28 – 2021-07-05 (×23): 395 mL
  Filled 2021-06-28 (×5): qty 474

## 2021-06-28 NOTE — Progress Notes (Signed)
Nutrition Follow-up  DOCUMENTATION CODES:   Not applicable  INTERVENTION:  No BM documented since 6/29; recommend initiation of bowel regimen  Continue TF via PEG: -Bolus 393m Osmolite 1.5 TID (total of 5 cartons per day) -473mProsource TF (or equivalent) BID -Flush with 6060mree water before and after each bolus   TF regimen provides 1855 kcals, 96g protein, 905m32mee water (1265ml40mal free water with boluses)  -Continue nutrisource fiber BID -Continue Boost Breeze TID  NUTRITION DIAGNOSIS:   Inadequate oral intake related to inability to eat as evidenced by NPO status.  Progressing, diet advanced to dysphagia 3 with thin liquids  GOAL:   Patient will meet greater than or equal to 90% of their needs  Not met  MONITOR:   Diet advancement, Labs, Weight trends, TF tolerance, Skin, I & O's  REASON FOR ASSESSMENT:   Other (Comment)    ASSESSMENT:   Pt is a 60 yo51w/ PMH sinusitis, rotator cuff tear, tobacco, marijuana, and ETOH use, remote hx of crack cocaine use who is being managed for intraventricular hemorrhage w/ large acute SAH now s/p ventriculostomy.  5/27 cortrak placed 6/14 VP shunt 6/21 s/p PEG placement 6/22 diet advanced to dysphagia 1 with thin liquids 6/28 diet advanced to dysphagia 3 with thin liquids  Calorie count conducted over the weekend. Only one meal ticket provided by RN staff -- pt ate none of the meal and only bites of the pudding and Magic Cup. Meal completions in Epic show similar intake with 0-bites documented. Given pt's continued poor po intake, recommend returning to meeting 100% kcal/protein needs via TF. Discussed with MD who has agreed with plan of care.   No BM documented since 6/29; recommend initiation of bowel regimen  UOP: 335ml 56mmented x24 hours  Admission weight: 63.5 kg Current weight: 60.3 kg  Medications:  chlorhexidine  15 mL Mouth Rinse BID   Chlorhexidine Gluconate Cloth  6 each Topical Daily   feeding  supplement  1 Container Oral TID BM   feeding supplement (OSMOLITE 1.5 CAL)  395 mL Per Tube TID   feeding supplement (PROSource TF)  45 mL Per Tube BID   fiber  1 packet Per Tube BID   free water  100 mL Per Tube Q4H   heparin injection (subcutaneous)  5,000 Units Subcutaneous Q8H   mouth rinse  15 mL Mouth Rinse q12n4p   nystatin  1 application Topical TID   sodium chloride  1 g Per Tube BID WC   Labs: Recent Labs  Lab 06/23/21 0327  NA 142  K 3.7  CL 107  CO2 28  BUN 15  CREATININE 0.59*  CALCIUM 9.8  MG 2.0  GLUCOSE 114*  CBGs 124-114-108   Diet Order:   Diet Order             DIET DYS 3 Room service appropriate? Yes with Assist; Fluid consistency: Thin  Diet effective now                   EDUCATION NEEDS:   Education needs have been addressed  Skin:  Skin Assessment: Skin Integrity Issues: Skin Integrity Issues:: Other (Comment), Incisions Incisions: head, abdomen Other: MASD scrotum  Last BM:  6/29  Height:   Ht Readings from Last 1 Encounters:  05/20/21 _0  (1.753 m)    Weight:   Wt Readings from Last 1 Encounters:  06/25/21 60.3 kg    Ideal Body Weight:  72.7 kg  BMI:  Body mass index is 19.63 kg/m.  Estimated Nutritional Needs:   Kcal:  1800-2100 kcals  Protein:  90-110 g  Fluid:  >/= 1.8 L    Larkin Ina, MS, RD, LDN (she/her/hers) RD pager number and weekend/on-call pager number located in [place].

## 2021-06-28 NOTE — Plan of Care (Signed)
  Problem: Clinical Measurements: Goal: Ability to maintain clinical measurements within normal limits will improve Outcome: Progressing Goal: Will remain free from infection Outcome: Progressing Goal: Diagnostic test results will improve Outcome: Progressing Goal: Respiratory complications will improve Outcome: Progressing Goal: Cardiovascular complication will be avoided Outcome: Progressing   Problem: Activity: Goal: Risk for activity intolerance will decrease Outcome: Progressing   Problem: Nutrition: Goal: Adequate nutrition will be maintained Outcome: Progressing   Problem: Coping: Goal: Level of anxiety will decrease Outcome: Progressing   Problem: Elimination: Goal: Will not experience complications related to urinary retention Outcome: Progressing   Problem: Pain Managment: Goal: General experience of comfort will improve Outcome: Progressing   Problem: Safety: Goal: Ability to remain free from injury will improve Outcome: Progressing   Problem: Skin Integrity: Goal: Risk for impaired skin integrity will decrease Outcome: Progressing   Problem: Safety: Goal: Non-violent Restraint(s) Outcome: Progressing   

## 2021-06-28 NOTE — Progress Notes (Addendum)
NF PROGRESS NOTE    [name] [name]  OZY:[id number] DOB: 02-Oct-1961 DOA: 05/19/2021 PCP: Pcp, No    Brief Narrative:  [name] [name] is a 60 year old male with past medical history significant for essential hypertension, substance abuse with marijuana/EtOH/crack cocaine who presented to [facility] on 5/25 after being found down by family and police after a welfare check given patient did not show up for work.  Brought in by EMS with patient being very lethargic.  CT head without contrast notable for moderate intraventricular hemorrhage with large SAH that was suspicious of an aneurysm.  Patient was seen emergently at bedside by neurosurgery and underwent external ventriculostomy drain at bedside by Dr. Wynetta Emery.  Patient was initially admitted to the [facility] service.  Patient underwent diagnostic cerebral angiogram by neurosurgery, Dr. Conchita Paris on 05/20/2021 with successful coil embolization of the right A1-A2 junction aneurysm.  Patient then underwent a laparoscopic assisted ventriculoperitoneal shunt placement by Dr. Conchita Paris and Dr. Janee Morn on 06/08/2021.  Patient was transferred to the hospitalist service on 06/10/2021.  Currently medically stable for discharge pending SNF but difficult to place per TOC.    Assessment & Plan:   Active Problems:   Cerebral salt-wasting   Aneurysmal subarachnoid hemorrhage (HCC)   Vasospasm of cerebral artery   Diarrheal stools   Subarachnoid hemorrhage 2/2 ACA aneurysm s/p coil embolization 5/26 Communicating hydrocephalus secondary to [facility] Patient presenting to the ED after being found down at home by family and police on a welfare check given patient did not show up for work.  Patient was noted to be altered, CT head notable for intraventricular hemorrhage with large SAH suspicious for aneurysm.  Seen by neurosurgery emergently at bedside s/p EVD 5/25.  Cerebral angiogram with coil embolization ACA aneurysm on 5/26.  Underwent VP shunt on 6/14.  Completed seizure  prophylactic course of Keppra. Surgical staples of VP shunt removed on 6/29.  Outpatient follow-up with neurosurgery.  Appears to have chronic mental status changes/encephalopathy with varying degrees of alertness and communicativeness.  Dysphagia Etiology likely secondary to hemorrhagic stroke as above.  Tolerating tube feeds via PEG.  Already trialed oral intake in the daytime and tube feeds at night but updated today that he is not eating much in the daytime to meet his nutritional needs and hence going back to continuous tube feeds.  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  Remains on sodium chloride tablets 1 g 3 times daily.  Gradually wean off salt tablets if able.  Will repeat BMP in AM.  Hypokalemia Labs unremarkable today.  Physical deconditioning in the setting of SAH: --Continue PT/OT efforts, TOC for placement  History of polysubstance abuse with EtOH/marijuana/distant history of crack cocaine --Abstinence  DVT prophylaxis: heparin injection 5,000 Units Start: 06/15/21 2230 SCD's Start: 05/19/21 2021   Code Status: Full Code Family Communication: Discussed in detail with patient's mother on 7/5 at bedside.  Updated care and answered questions  Remains inpatient appropriate because:Unsafe d/c plan  Dispo: The patient is from: Home              Anticipated d/c is to: SNF              Patient currently is medically stable to d/c.   Difficult to place patient Yes   Significant Events: 5/25 Admitted with [facility] secondary to ruptured aneurysm, ventriculostomy preformed in ED, Ancef started 5/26 Coil Embolization 5/29 worsening neurological exam consistent with vasospasm, hemodynamic augmentation initiated. 6/1 worsening hyponatremia: Na of 120.  Patient started  on salt tablets 1 g twice daily and fludrocortisone 0.1 mg. 6/2 BP goal reduced 160 -180.  Salt Tablet increased to 1 g tid. Fludrocortisone increased 0.2 mg. 6/8 Ongoing agitation,  intermittently following commands. CT Head with worsening hydrocephalus despite EVD, open to 10 per NSGY. 6/9 EVD remains in place, repeat head CT as below 6/10-12, no significant neuro changes, intermittently agitated.  Ongoing EVD, plans for VP shunt next week 6/13 no neuro changes, remains intermittently restless 6/14 went for VP shunt, no neuro changes, afebrile 6/21 PEG tube placement by IR    Consultants:  PCCM Neurosurgery General surgery -assisted neurosurgery with laparoscopic portion of VP shunt placement  Procedures:  Bedside EVD by Dr. Wynetta Emery on 5/25 ACA coil embolization 5/26, Dr. Conchita Paris VP shunt placement 6/14, Dr. Conchita Paris and Dr. Janee Morn PEG tube placement by IR 6/21, Dr. Grace Isaac  Antimicrobials:  Cefazolin 5/25 - 6/2, 6/14 - 6/15, 6/21 - 6/22,    Subjective: Patient awake and alert but not saying much.  Mother at bedside.  No concerns regarding pain or limb pain that she had expressed earlier.  She feels that it was just because he was lying on that right arm when she saw him a couple days ago.  Objective: Vitals:   06/29/21 0500 06/29/21 0800 06/29/21 1217 06/29/21 1620  BP:  112/90 (!) 149/93 120/78  Pulse:   98 (!) 103  Resp:  18 16   Temp:  (!) 97.4 F (36.3 C)    TempSrc:  Oral Other (Comment)   SpO2:   98% 100%  Weight: 59.2 kg     Height:        Intake/Output Summary (Last 24 hours) at 06/29/2021 1700 Last data filed at 06/29/2021 1110 Gross per 24 hour  Intake 100 ml  Output 1000 ml  Net -900 ml    Filed Weights   06/24/21 0228 06/25/21 0339 06/29/21 0500  Weight: 58.2 kg 60.3 kg 59.2 kg    Examination:  General exam: Appears calm and comfortable, chronically ill in appearance, appears older than stated age, sitting up comfortably in reclining chair this morning. Respiratory system: Clear to auscultation.  No increased work of breathing. Cardiovascular system: S1 and S2 heard, RRR.  No JVD, murmurs or pedal edema. Gastrointestinal  system: Abdomen is nondistended, soft and nontender. No organomegaly or masses felt. Normal bowel sounds heard. Central nervous system: Alert and tracks activity with his eyes, watching TV but not saying anything.  No focal neurological deficits. Extremities: Spontaneously moves all limbs without pain or difficulty Skin: No rashes, lesions or ulcers Psychiatry: Judgement and insight impaired. Mood & affect calm, cooperative and appropriate this morning.    Data Reviewed: I have personally reviewed following labs and imaging studies  CBC: Recent Labs  Lab 06/29/21 0302  WBC 7.8  HGB 13.8  HCT 40.0  MCV 97.1  PLT 394    Basic Metabolic Panel: Recent Labs  Lab 06/23/21 0327 06/29/21 0302  NA 142 138  K 3.7 4.2  CL 107 103  CO2 28 30  GLUCOSE 114* 82  BUN 15 19  CREATININE 0.59* 0.52*  CALCIUM 9.8 9.6  MG 2.0  --    GFR: Estimated Creatinine Clearance: 83.3 mL/min (A) (by C-G formula based on SCr of 0.52 mg/dL (L)).  CBG: Recent Labs  Lab 06/28/21 2313 06/29/21 0340 06/29/21 0800 06/29/21 1144 06/29/21 1620  GLUCAP 80 89 102* 126* 112*      Radiology Studies: No results found.  Scheduled Meds:  chlorhexidine  15 mL Mouth Rinse BID   Chlorhexidine Gluconate Cloth  6 each Topical Daily   feeding supplement  1 Container Oral TID BM   feeding supplement (OSMOLITE 1.5 CAL)  395 mL Per Tube TID   feeding supplement (PROSource TF)  45 mL Per Tube BID   fiber  1 packet Per Tube BID   free water  100 mL Per Tube Q4H   heparin injection (subcutaneous)  5,000 Units Subcutaneous Q8H   mouth rinse  15 mL Mouth Rinse q12n4p   nystatin  1 application Topical TID   polyethylene glycol  17 g Per Tube Daily   senna-docusate  1 tablet Per Tube Daily   sodium chloride  1 g Per Tube BID WC   Continuous Infusions:     LOS: 41 days   Marcellus Scott, MD, [place], [facility]. [facility]  To contact the attending provider between 7A-7P or the covering provider  during after hours 7P-7A, please log into the web site [email or web address] and access using universal [place] password for that web site. If you do not have the password, please call the hospital operator.

## 2021-06-29 LAB — CBC
HCT: 40 % (ref 39.0–52.0)
Hemoglobin: 13.8 g/dL (ref 13.0–17.0)
MCH: 33.5 pg (ref 26.0–34.0)
MCHC: 34.5 g/dL (ref 30.0–36.0)
MCV: 97.1 fL (ref 80.0–100.0)
Platelets: 394 10*3/uL (ref 150–400)
RBC: 4.12 MIL/uL — ABNORMAL LOW (ref 4.22–5.81)
RDW: 12 % (ref 11.5–15.5)
WBC: 7.8 10*3/uL (ref 4.0–10.5)
nRBC: 0 % (ref 0.0–0.2)

## 2021-06-29 LAB — COMPREHENSIVE METABOLIC PANEL
ALT: 72 U/L — ABNORMAL HIGH (ref 0–44)
AST: 38 U/L (ref 15–41)
Albumin: 3.4 g/dL — ABNORMAL LOW (ref 3.5–5.0)
Alkaline Phosphatase: 59 U/L (ref 38–126)
Anion gap: 5 (ref 5–15)
BUN: 19 mg/dL (ref 6–20)
CO2: 30 mmol/L (ref 22–32)
Calcium: 9.6 mg/dL (ref 8.9–10.3)
Chloride: 103 mmol/L (ref 98–111)
Creatinine, Ser: 0.52 mg/dL — ABNORMAL LOW (ref 0.61–1.24)
GFR, Estimated: >60 mL/min (ref >60)
Glucose, Bld: 82 mg/dL (ref 70–99)
Potassium: 4.2 mmol/L (ref 3.5–5.1)
Sodium: 138 mmol/L (ref 135–145)
Total Bilirubin: 0.6 mg/dL (ref 0.3–1.2)
Total Protein: 6.6 g/dL (ref 6.5–8.1)

## 2021-06-29 LAB — GLUCOSE, CAPILLARY
Glucose-Capillary: 102 mg/dL — ABNORMAL HIGH (ref 70–99)
Glucose-Capillary: 110 mg/dL — ABNORMAL HIGH (ref 70–99)
Glucose-Capillary: 112 mg/dL — ABNORMAL HIGH (ref 70–99)
Glucose-Capillary: 115 mg/dL — ABNORMAL HIGH (ref 70–99)
Glucose-Capillary: 126 mg/dL — ABNORMAL HIGH (ref 70–99)
Glucose-Capillary: 89 mg/dL (ref 70–99)

## 2021-06-29 NOTE — Progress Notes (Signed)
[name] [name]  XTK:[id number] DOB: 04/17/1961 DOA: 05/19/2021 PCP: Pcp, No    Brief Narrative:  [name] [name] is a 60 year old male with past medical history significant for essential hypertension, substance abuse with marijuana/EtOH/crack cocaine who presented to [facility] on 5/25 after being found down by family and police after a welfare check given patient did not show up for work.  Brought in by EMS with patient being very lethargic.  CT head without contrast notable for moderate intraventricular hemorrhage with large SAH that was suspicious of an aneurysm.  Patient was seen emergently at bedside by neurosurgery and underwent external ventriculostomy drain at bedside by Dr. Wynetta Emery.  Patient was initially admitted to the [facility] service.  Patient underwent diagnostic cerebral angiogram by neurosurgery, Dr. Conchita Paris on 05/20/2021 with successful coil embolization of the right A1-A2 junction aneurysm.  Patient then underwent a laparoscopic assisted ventriculoperitoneal shunt placement by Dr. Conchita Paris and Dr. Janee Morn on 06/08/2021.  Patient was transferred to the hospitalist service on 06/10/2021.  Currently medically stable for discharge pending SNF but difficult to place per TOC.    Assessment & Plan:   Active Problems:   Cerebral salt-wasting   Aneurysmal subarachnoid hemorrhage (HCC)   Vasospasm of cerebral artery   Diarrheal stools   Subarachnoid hemorrhage 2/2 ACA aneurysm s/p coil embolization 5/26 Communicating hydrocephalus secondary to [facility] Patient presenting to the ED after being found down at home by family and police on a welfare check given patient did not show up for work.  Patient was noted to be altered, CT head notable for intraventricular hemorrhage with large SAH suspicious for aneurysm.  Seen by neurosurgery emergently at bedside s/p EVD 5/25.  Cerebral angiogram with coil embolization ACA aneurysm on 5/26.  Underwent VP shunt on 6/14.  Completed seizure  prophylactic course of Keppra. Surgical staples of VP shunt removed on 6/29.  Outpatient follow-up with neurosurgery.  Appears to have chronic mental status changes/encephalopathy with varying degrees of alertness and communicativeness  Dysphagia Etiology likely secondary to hemorrhagic stroke as above.  Tolerating tube feeds via PEG.  RD trialed oral intake in the daytime and tube feeds at night for the last few days.  She updated today indicating that patient was not eating much to meet his nutritional needs and thereby has changed to continuous tube feeds.    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  Remains on sodium chloride tablets 1 g 3 times daily.  Gradually wean off salt tablets if able.  Sodium normal.  Hypokalemia Labs unremarkable today.  Physical deconditioning in the setting of SAH: --Continue PT/OT efforts, TOC for placement  History of polysubstance abuse with EtOH/marijuana/distant history of crack cocaine --Abstinence  DVT prophylaxis: heparin injection 5,000 Units Start: 06/15/21 2230 SCD's Start: 05/19/21 2021   Code Status: Full Code Family Communication: Discussed in detail with patient's mother on 7/5 at bedside.  Updated care and answered questions  Remains inpatient appropriate because:Unsafe d/c plan  Dispo: The patient is from: Home              Anticipated d/c is to: SNF              Patient currently is medically stable to d/c.   Difficult to place patient Yes   Significant Events: 5/25 Admitted with [facility] secondary to ruptured aneurysm, ventriculostomy preformed in ED, Ancef started 5/26 Coil Embolization 5/29 worsening neurological exam consistent with vasospasm, hemodynamic augmentation initiated. 6/1 worsening hyponatremia: Na of 120.  Patient started on salt tablets 1 g twice daily and fludrocortisone 0.1 mg. 6/2 BP goal reduced 160 -180.  Salt Tablet increased to 1 g tid. Fludrocortisone increased 0.2 mg. 6/8 Ongoing  agitation, intermittently following commands. CT Head with worsening hydrocephalus despite EVD, open to 10 per NSGY. 6/9 EVD remains in place, repeat head CT as below 6/10-12, no significant neuro changes, intermittently agitated.  Ongoing EVD, plans for VP shunt next week 6/13 no neuro changes, remains intermittently restless 6/14 went for VP shunt, no neuro changes, afebrile 6/21 PEG tube placement by IR    Consultants:  PCCM Neurosurgery General surgery -assisted neurosurgery with laparoscopic portion of VP shunt placement  Procedures:  Bedside EVD by Dr. Wynetta Emery on 5/25 ACA coil embolization 5/26, Dr. Conchita Paris VP shunt placement 6/14, Dr. Conchita Paris and Dr. Janee Morn PEG tube placement by IR 6/21, Dr. Grace Isaac  Antimicrobials:  Cefazolin 5/25 - 6/2, 6/14 - 6/15, 6/21 - 6/22,    Subjective: Patient awake and alert but not saying much.  Mother at bedside.  No concerns of pain.    Objective: Vitals:   06/29/21 0500 06/29/21 0800 06/29/21 1217 06/29/21 1620  BP:  112/90 (!) 149/93 120/78  Pulse:   98 (!) 103  Resp:  18 16   Temp:  (!) 97.4 F (36.3 C)    TempSrc:  Oral Other (Comment)   SpO2:   98% 100%  Weight: 59.2 kg     Height:        Intake/Output Summary (Last 24 hours) at 06/29/2021 1705 Last data filed at 06/29/2021 1110 Gross per 24 hour  Intake 100 ml  Output 1000 ml  Net -900 ml     Filed Weights   06/24/21 0228 06/25/21 0339 06/29/21 0500  Weight: 58.2 kg 60.3 kg 59.2 kg    Examination:  General exam: Appears calm and comfortable, chronically ill in appearance, appears older than stated age, sitting up comfortably in reclining chair this morning. Respiratory system: Clear to auscultation.  No increased work of breathing. Cardiovascular system: S1 and S2 heard, RRR.  No JVD, murmurs or pedal edema. Gastrointestinal system: Abdomen is nondistended, soft and nontender. No organomegaly or masses felt. Normal bowel sounds heard. Central nervous system: Alert  and tracks activity with his eyes, watching TV but not saying anything.  No focal neurological deficits. Extremities: Spontaneously moves all limbs without pain or difficulty Skin: No rashes, lesions or ulcers Psychiatry: Judgement and insight impaired. Mood & affect calm, cooperative and appropriate this morning.    Data Reviewed: I have personally reviewed following labs and imaging studies  CBC: Recent Labs  Lab 06/29/21 0302  WBC 7.8  HGB 13.8  HCT 40.0  MCV 97.1  PLT 394     Basic Metabolic Panel: Recent Labs  Lab 06/23/21 0327 06/29/21 0302  NA 142 138  K 3.7 4.2  CL 107 103  CO2 28 30  GLUCOSE 114* 82  BUN 15 19  CREATININE 0.59* 0.52*  CALCIUM 9.8 9.6  MG 2.0  --     GFR: Estimated Creatinine Clearance: 83.3 mL/min (A) (by C-G formula based on SCr of 0.52 mg/dL (L)).  CBG: Recent Labs  Lab 06/28/21 2313 06/29/21 0340 06/29/21 0800 06/29/21 1144 06/29/21 1620  GLUCAP 80 89 102* 126* 112*       Radiology Studies: No results found.      Scheduled Meds:  chlorhexidine  15 mL Mouth Rinse BID   Chlorhexidine Gluconate Cloth  6 each Topical Daily  feeding supplement  1 Container Oral TID BM   feeding supplement (OSMOLITE 1.5 CAL)  395 mL Per Tube TID   feeding supplement (PROSource TF)  45 mL Per Tube BID   fiber  1 packet Per Tube BID   free water  100 mL Per Tube Q4H   heparin injection (subcutaneous)  5,000 Units Subcutaneous Q8H   mouth rinse  15 mL Mouth Rinse q12n4p   nystatin  1 application Topical TID   polyethylene glycol  17 g Per Tube Daily   senna-docusate  1 tablet Per Tube Daily   sodium chloride  1 g Per Tube BID WC   Continuous Infusions:     LOS: 41 days   Marcellus Scott, MD, [place], [facility]. [facility]  To contact the attending provider between 7A-7P or the covering provider during after hours 7P-7A, please log into the web site [email or web address] and access using universal  password for that web site.  If you do not have the password, please call the hospital operator.

## 2021-06-29 NOTE — Progress Notes (Signed)
Occupational Therapy Treatment Patient Details Name: [name] [name] MRN: [id number] DOB: 08/31/1961 Today's Date: 06/29/2021    History of present illness Pt is a 59 y.o. M who was admitted to ICU 05/19/21 with IVH with large acute SAH now s/p ventriculostomy. 5/26 coil embolization. 5/29 worsening neurological exam consistent with vasospasm. VP shunt placement 6/14. Significant PMH: rotator cuff tear, tobacco, marijuana and ETOH use, remote hx crack cocaine use.   OT comments  Pt difficult to arouse in beginning of session, but with movement, pt easier to arouse. Pt would follow simple commands and state verbalizations, but not always audible. Pt following simple commands with mutlimodal cues. Pt's session focused on sitting balance, decreased strength and decreased ability to care for self. Pt's cognition appears to be increasing with alertness. Performing sit to stand from EOB x2, maxA +2 bilat knees blocked for stability.  Pt would benefit from continued OT skilled services. OT following acutely.   Follow Up Recommendations  SNF    Equipment Recommendations  Wheelchair (measurements OT);Wheelchair cushion (measurements OT);Hospital bed    Recommendations for Other Services      Precautions / Restrictions Precautions Precautions: Fall;Other (comment) Precaution Comments: PEG Restrictions Weight Bearing Restrictions: No       Mobility Bed Mobility Overal bed mobility: Needs Assistance Bed Mobility: Supine to Sit     Supine to sit: HOB elevated;Mod assist     General bed mobility comments: increased time and effort needed, assistance for bilateral LE movement off of bed, pt able to initiate trunk elevation to achieve an upright sitting position at EOB    Transfers Overall transfer level: Needs assistance Equipment used: 2 person hand held assist Transfers: Sit to/from [employer] Sit to Stand: Max assist;+2 physical assistance   Squat pivot transfers:  +2 physical assistance;Max assist     General transfer comment: sit to stand from EOB x2, maxA +2 bilat knees blocked for stability; cues for standing upright; squat pivot to recliner.    Balance Overall balance assessment: Needs assistance Sitting-balance support: Feet supported Sitting balance-Leahy Scale: Poor Sitting balance - Comments: very poor sitting balance with several R lateral and posterior LOBs without any protective reactions noted Postural control: Posterior lean;Right lateral lean Standing balance support: Bilateral upper extremity supported;During functional activity Standing balance-Leahy Scale: Zero Standing balance comment: max A x2 needed                           ADL either performed or assessed with clinical judgement   ADL Overall ADL's : Needs assistance/impaired                                     Functional mobility during ADLs: Maximal assistance;+2 for physical assistance;+2 for safety/equipment;Cueing for sequencing;Cueing for safety General ADL Comments: Pt's session focused on sitting balance, decreased strength and decreased ability to care for self. Pt's cognition appears to be increasing with alertness.     Vision   Vision Assessment?: Vision impaired- to be further tested in functional context Additional Comments: continue to assess   Perception     Praxis      Cognition Arousal/Alertness: Awake/alert Behavior During Therapy: Flat affect Overall Cognitive Status: Impaired/Different from baseline Area of Impairment: Orientation;Attention;Memory;Following commands;Safety/judgement;Awareness;Problem solving                 Orientation Level: Disoriented to;Situation;Time Current Attention Level:  Sustained Memory: Decreased short-term memory Following Commands: Follows one step commands inconsistently;Follows one step commands with increased time Safety/Judgement: Decreased awareness of deficits;Decreased  awareness of safety Awareness: Intellectual Problem Solving: Slow processing;Decreased initiation;Difficulty sequencing;Requires verbal cues;Requires tactile cues General Comments: Pt difficult to arouse, but with movement, pt easier to arouse. Pt would follow simple commands and state verbalizations, but not always audible. Pt following simple commands with mutlimodal cues.        Exercises Other Exercises Other Exercises: AAROM BUE/LE   Shoulder Instructions       General Comments      Pertinent Vitals/ Pain       Pain Assessment: Faces Faces Pain Scale: No hurt Pain Location: general discomfort Pain Descriptors / Indicators: Restless Pain Intervention(s): Monitored during session  Home Living                                          Prior Functioning/Environment              Frequency  Min 2X/week        Progress Toward Goals  OT Goals(current goals can now be found in the care plan section)  Progress towards OT goals: Progressing toward goals  Acute Rehab OT Goals Patient Stated Goal: unable to state OT Goal Formulation: Patient unable to participate in goal setting Time For Goal Achievement: 07/13/21 Potential to Achieve Goals: Good ADL Goals Pt Will Perform Grooming: with min assist;sitting Pt Will Perform Lower Body Dressing: with max assist;sitting/lateral leans;sit to/from stand Pt Will Perform Toileting - Clothing Manipulation and hygiene: with mod assist;sit to/from stand Pt/caregiver will Perform Home Exercise Program: Increased ROM;Increased strength;Both right and left upper extremity;With minimal assist;With written HEP provided Additional ADL Goal #1: Pt will follow 1-2 step commands with minimal cues to attend to task in 3/5 trials. Additional ADL Goal #2: Pt will utilize strategies for L inattention in order to participate with tasks on L side with minimal cues in 3/5 trials.  Plan Discharge plan remains appropriate     Co-evaluation    PT/OT/SLP Co-Evaluation/Treatment: Yes Reason for Co-Treatment: Complexity of the patient's impairments (multi-system involvement);For patient/therapist safety;To address functional/ADL transfers   OT goals addressed during session: ADL's and self-care      AM-PAC OT "6 Clicks" Daily Activity     Outcome Measure   Help from another person eating meals?: A Lot Help from another person taking care of personal grooming?: A Lot Help from another person toileting, which includes using toliet, bedpan, or urinal?: Total Help from another person bathing (including washing, rinsing, drying)?: Total Help from another person to put on and taking off regular upper body clothing?: A Lot Help from another person to put on and taking off regular lower body clothing?: Total 6 Click Score: 9    End of Session Equipment Utilized During Treatment: Gait belt  OT Visit Diagnosis: Unsteadiness on feet (R26.81);Muscle weakness (generalized) (M62.81);Other symptoms and signs involving cognitive function   Activity Tolerance Patient tolerated treatment well   Patient Left in bed;with call bell/phone within reach;with bed alarm set;with restraints reapplied;with SCD's reapplied   Nurse Communication Mobility status        Time: 5573-2202 OT Time Calculation (min): 24 min  Charges: OT General Charges $OT Visit: 1 Visit OT Treatments $Neuromuscular Re-education: 8-22 mins  Flora Lipps, OTR/L Acute Rehabilitation Services Pager: [phone] Office: [phone]  Kaiya Boatman C 06/29/2021, 2:42 PM

## 2021-06-29 NOTE — Plan of Care (Signed)

## 2021-06-29 NOTE — Progress Notes (Signed)
  Speech Language Pathology Treatment: Dysphagia;Cognitive-Linquistic  Patient Details Name: [name] [name] MRN: [id number] DOB: [birth year] Today's Date: 06/29/2021 Time: 8413-2440 SLP Time Calculation (min) (ACUTE ONLY): 22 min  Assessment / Plan / Recommendation Clinical Impression  Pt was seen for swallowing tx but did not eat any solids offered. He did consume thin liquids with no overt s/s of aspiration, but needing frequent cues for sustained attention and command following.   Session also focused on completion of the SLUMS, which was specifically requested by NP. Pt had significant difficulty completing testing, scoring 3/30, appearing to have significant receptive language deficits that likely impacted performance. He accurately identified the day of the week, the state we are in, and which of three items was the largest. For all additional prompts he often responded with fluent responses, which at times were appropriate, but often void of much content ("I see where you're going with this," "too much going on," and "I'll take it from here" as a few examples). Even in more functional contexts he had a lot of difficulty following one-step commands. Pt will benefit from ongoing cognitive-linguistic and swallowing therapy with 24/7 supervision also recommended upon discharge.    HPI HPI: Pt is a 34 you w/ PMH sinusitis, rotator cuff tear, tobacco, marijuana, and ETOH use, remote hx of crack cocaine use who is being managed for parenchymal hemorrhage within the anterior  left frontal lobe. Mild surrounding edema within the anterior left  frontal lobe, now s/p ventriculostomy. Ventric removed and shunt placed 6/14.      SLP Plan  Continue with current plan of care       Recommendations  Diet recommendations: Dysphagia 3 (mechanical soft);Thin liquid Liquids provided via: Cup;Straw Medication Administration: Crushed with puree Supervision: Full supervision/cueing for compensatory  strategies;Staff to assist with self feeding Compensations: Slow rate;Small sips/bites;Follow solids with liquid Postural Changes and/or Swallow Maneuvers: Seated upright 90 degrees;Upright 30-60 min after meal                Oral Care Recommendations: Oral care BID Follow up Recommendations: Skilled Nursing facility SLP Visit Diagnosis: Dysphagia, unspecified (R13.10);Cognitive communication deficit (R41.841) Plan: Continue with current plan of care       GO                Mahala Menghini., M.A. CCC-SLP Acute Rehabilitation Services Pager [phone] Office [phone]  06/29/2021, 4:19 PM

## 2021-06-29 NOTE — Progress Notes (Signed)
Physical Therapy Treatment Patient Details Name: [name] [name] MRN: [id number] DOB: 1961/09/19 Today's Date: 06/29/2021    History of Present Illness Pt is a 60 y.o. M who was admitted to ICU 05/19/21 with IVH with large acute SAH now s/p ventriculostomy. 5/26 coil embolization. 5/29 worsening neurological exam consistent with vasospasm. VP shunt placement 6/14. Significant PMH: rotator cuff tear, tobacco, marijuana and ETOH use, remote hx crack cocaine use.    PT Comments    Pt seen for mobility progression. He was asleep upon arrival but able to be aroused to participate in therapy session. He continues to require heavy two person physical assistance for functional mobility. Pt would continue to benefit from skilled physical therapy services at this time while admitted and after d/c to address the below listed limitations in order to improve overall safety and independence with functional mobility.    Follow Up Recommendations  SNF     Equipment Recommendations  Other (comment) (defer to next venue of care)    Recommendations for Other Services       Precautions / Restrictions Precautions Precautions: Fall;Other (comment) Precaution Comments: PEG Restrictions Weight Bearing Restrictions: No    Mobility  Bed Mobility Overal bed mobility: Needs Assistance Bed Mobility: Supine to Sit     Supine to sit: HOB elevated;Mod assist     General bed mobility comments: increased time and effort needed, assistance for bilateral LE movement off of bed, pt able to initiate trunk elevation to achieve an upright sitting position at EOB    Transfers Overall transfer level: Needs assistance Equipment used: 2 person hand held assist Transfers: Sit to/from [employer] Sit to Stand: Max assist;+2 physical assistance   Squat pivot transfers: +2 physical assistance;Max assist     General transfer comment: pt with posterior lean and some what resistant to standing; he  demonstrated great difficulties with anterior weight shift and unable to get his L LE appropriately positioned underneath of himself in standing. pt performed sit<>stand from EOB x2 with max A x2 needed each time, pivot towards his R side to the recliner chair  Ambulation/Gait                 Stairs             Wheelchair Mobility    Modified Rankin (Stroke Patients Only) Modified Rankin (Stroke Patients Only) Pre-Morbid Rankin Score: No symptoms Modified Rankin: Severe disability     Balance Overall balance assessment: Needs assistance Sitting-balance support: Feet supported Sitting balance-Leahy Scale: Poor Sitting balance - Comments: very poor sitting balance with several R lateral and posterior LOBs without any protective reactions noted Postural control: Posterior lean;Right lateral lean Standing balance support: Bilateral upper extremity supported;During functional activity Standing balance-Leahy Scale: Zero Standing balance comment: max A x2 needed                            Cognition Arousal/Alertness: Awake/alert Behavior During Therapy: Flat affect Overall Cognitive Status: Impaired/Different from baseline Area of Impairment: Orientation;Attention;Memory;Following commands;Safety/judgement;Awareness;Problem solving                 Orientation Level: Disoriented to;Situation;Time Current Attention Level: Sustained Memory: Decreased short-term memory Following Commands: Follows one step commands inconsistently;Follows one step commands with increased time Safety/Judgement: Decreased awareness of deficits;Decreased awareness of safety Awareness: Intellectual Problem Solving: Slow processing;Decreased initiation;Difficulty sequencing;Requires verbal cues;Requires tactile cues        Exercises  General Comments        Pertinent Vitals/Pain Pain Assessment: Faces Faces Pain Scale: No hurt    Home Living                       Prior Function            PT Goals (current goals can now be found in the care plan section) Acute Rehab PT Goals PT Goal Formulation: With patient/family Time For Goal Achievement: 07/13/21 Potential to Achieve Goals: Fair Progress towards PT goals: Progressing toward goals    Frequency    Min 2X/week      PT Plan Current plan remains appropriate    Co-evaluation PT/OT/SLP Co-Evaluation/Treatment: Yes Reason for Co-Treatment: Complexity of the patient's impairments (multi-system involvement);Necessary to address cognition/behavior during functional activity;To address functional/ADL transfers;For patient/therapist safety PT goals addressed during session: Mobility/safety with mobility;Balance;Strengthening/ROM        AM-PAC PT "6 Clicks" Mobility   Outcome Measure  Help needed turning from your back to your side while in a flat bed without using bedrails?: A Lot Help needed moving from lying on your back to sitting on the side of a flat bed without using bedrails?: A Lot Help needed moving to and from a bed to a chair (including a wheelchair)?: Total Help needed standing up from a chair using your arms (e.g., wheelchair or bedside chair)?: Total Help needed to walk in hospital room?: Total Help needed climbing 3-5 steps with a railing? : Total 6 Click Score: 8    End of Session Equipment Utilized During Treatment: Gait belt Activity Tolerance: Patient tolerated treatment well Patient left: in chair;with call bell/phone within reach;with chair alarm set Nurse Communication: Mobility status PT Visit Diagnosis: Unsteadiness on feet (R26.81);Muscle weakness (generalized) (M62.81);Difficulty in walking, not elsewhere classified (R26.2);Other abnormalities of gait and mobility (R26.89)     Time: 8676-7209 PT Time Calculation (min) (ACUTE ONLY): 24 min  Charges:  $Therapeutic Activity: 8-22 mins                     Arletta Bale, DPT  Acute  Rehabilitation Services Pager [phone] Office [phone]    Alessandra Bevels Aidee Latimore 06/29/2021, 10:11 AM

## 2021-06-30 DIAGNOSIS — R4701 Aphasia: Secondary | ICD-10-CM

## 2021-06-30 DIAGNOSIS — Z982 Presence of cerebrospinal fluid drainage device: Secondary | ICD-10-CM

## 2021-06-30 DIAGNOSIS — Z931 Gastrostomy status: Secondary | ICD-10-CM

## 2021-06-30 DIAGNOSIS — G910 Communicating hydrocephalus: Secondary | ICD-10-CM

## 2021-06-30 LAB — GLUCOSE, CAPILLARY
Glucose-Capillary: 109 mg/dL — ABNORMAL HIGH (ref 70–99)
Glucose-Capillary: 145 mg/dL — ABNORMAL HIGH (ref 70–99)
Glucose-Capillary: 92 mg/dL (ref 70–99)

## 2021-06-30 NOTE — Progress Notes (Signed)
Occupational Therapy Treatment Patient Details Name: [name] [name] MRN: [id number] DOB: 1961/06/28 Today's Date: 06/30/2021    History of present illness Pt is a 60 y.o. M who was admitted to ICU 05/19/21 with IVH with large acute SAH now s/p ventriculostomy. 5/26 coil embolization. 5/29 worsening neurological exam consistent with vasospasm. VP shunt placement 6/14. Significant PMH: rotator cuff tear, tobacco, marijuana and ETOH use, remote hx crack cocaine use.   OT comments  Pt seen for further cognitive assessment using Pillbox Test. Pt able to read labels as first part of test and able to tell OTR where to place 2 "pills," but pt unable to sustain attention on task at this time. Pt inappropriately laughing at times. Pt with max cues to complete 1/5 pill boxes.  Pt alert and attempting to complete task, but unable to pass the Pillbox Test at this time.  ADL task complete sitting edge of recliner. Pt performing grooming with cues for sequencing. PLOF: Pt had a job.  Pt would require 24/7 for medication management and for overall safety in a post acute care setting due to new cognitive deficits and inability to properly perform self care and safe decision-making with novel and familiar tasks. (Delayed entry)     Follow Up Recommendations  SNF    Equipment Recommendations  Wheelchair (measurements OT);Wheelchair cushion (measurements OT);Hospital bed    Recommendations for Other Services      Precautions / Restrictions Precautions Precautions: Fall;Other (comment) Precaution Comments: PEG       Mobility Bed Mobility                    Transfers                      Balance                                           ADL either performed or assessed with clinical judgement   ADL       Grooming: Set up;Cueing for sequencing;Sitting Grooming Details (indicate cue type and reason): max cues for sequencing.                                      Vision       Perception     Praxis      Cognition Arousal/Alertness: Awake/alert Behavior During Therapy: Flat affect Overall Cognitive Status: Impaired/Different from baseline Area of Impairment: Orientation;Attention;Memory;Following commands;Safety/judgement;Awareness;Problem solving                 Orientation Level: Disoriented to;Situation;Time Current Attention Level: Sustained Memory: Decreased short-term memory Following Commands: Follows one step commands inconsistently;Follows one step commands with increased time Safety/Judgement: Decreased awareness of deficits;Decreased awareness of safety Awareness: Intellectual Problem Solving: Slow processing;Decreased initiation;Difficulty sequencing;Requires verbal cues;Requires tactile cues General Comments: Pt seen for further cognitive assessment using Pillbox Test. Pt able to read labels as first part of test and able to tell OTR where to place 2 "pills," but pt unable to sustain attention on task at this time. Pt inappropriately laughing at times. Pt with max cues to complete 1/5 pil boxes. PLOF: Pt had a job. Pt alert and attempting to complete task, but unable to pass the Pillbox Test at this time. Pt would require 24/7 for medication  management and for overall safety in a post acute care setting.        Exercises     Shoulder Instructions       General Comments      Pertinent Vitals/ Pain       Pain Assessment: Faces Faces Pain Scale: No hurt  Home Living                                          Prior Functioning/Environment              Frequency  Min 2X/week        Progress Toward Goals  OT Goals(current goals can now be found in the care plan section)     Acute Rehab OT Goals Patient Stated Goal: unable to state OT Goal Formulation: Patient unable to participate in goal setting Time For Goal Achievement: 07/13/21 Potential to Achieve Goals:  Good ADL Goals Pt Will Perform Grooming: with min assist;sitting Pt Will Perform Lower Body Dressing: with max assist;sitting/lateral leans;sit to/from stand Pt Will Perform Toileting - Clothing Manipulation and hygiene: with mod assist;sit to/from stand Pt/caregiver will Perform Home Exercise Program: Increased ROM;Increased strength;Both right and left upper extremity;With minimal assist;With written HEP provided Additional ADL Goal #1: Pt will follow 1-2 step commands with minimal cues to attend to task in 3/5 trials. Additional ADL Goal #2: Pt will utilize strategies for L inattention in order to participate with tasks on L side with minimal cues in 3/5 trials.  Plan Discharge plan remains appropriate    Co-evaluation                 AM-PAC OT "6 Clicks" Daily Activity     Outcome Measure   Help from another person eating meals?: A Lot Help from another person taking care of personal grooming?: A Lot Help from another person toileting, which includes using toliet, bedpan, or urinal?: Total Help from another person bathing (including washing, rinsing, drying)?: Total Help from another person to put on and taking off regular upper body clothing?: A Lot Help from another person to put on and taking off regular lower body clothing?: Total 6 Click Score: 9    End of Session    OT Visit Diagnosis: Unsteadiness on feet (R26.81);Muscle weakness (generalized) (M62.81);Other symptoms and signs involving cognitive function   Activity Tolerance Patient tolerated treatment well   Patient Left in bed;with call bell/phone within reach;with bed alarm set;with restraints reapplied;with SCD's reapplied   Nurse Communication Mobility status        Time: 5366-4403 OT Time Calculation (min): 35 min  Charges: OT Treatments $Cognitive Funtion inital: Initial 15 mins $Cognitive Funtion additional: Additional15 mins   Flora Lipps, OTR/L Acute Rehabilitation Services Pager:  [phone] Office: [phone]    Thorn Demas C 06/30/2021, 5:20 AM

## 2021-06-30 NOTE — Progress Notes (Signed)
CSW spoke with [name] [name] at [facility] who is agreeable to review referral.  Edwin Dada, MSW, LCSW Transitions of Care  Clinical Social Worker II [phone]

## 2021-06-30 NOTE — Progress Notes (Addendum)
[facility] PROGRESS NOTE  [name] [name] FUX:[id number] DOB: [birth year] DOA: 05/19/2021 PCP: Pcp, No  Status:  Remains inpatient appropriate because:Altered mental status and Unsafe d/c plan  Dispo: The patient is from: Home              Anticipated d/c is to: SNF              Patient currently is medically stable to d/c.   Difficult to place patient Yes              Barriers to discharge: Anticipate will require prolonged rehabilitation and unclear at this point if some of this can be completed at home once he is more stable.  Currently he needs SNF placement.  Patient has private insurance from job that he is no longer able to return to.  Disability and Medicaid applications will be initiated with assistance of financial counseling team.   Level of care: Progressive  Code Status:  Family Communication:  DVT prophylaxis:  COVID vaccination status:    HPI: 60 year old male with past medical history significant for substance abuse with marijuana/EtOH/crack cocaine. Seen in the ER initially on 5/11 for headache with negative CT imaging and was discharged with a diagnosis of sinus headache. He returned to the ER on 5/16 due to complaint of persistent headache not relieved by medication prescribed at 5/11 visit.  CT head at visit on 5/16 also unremarkable and he did not demonstrate any focal neurological signs at that visit as well.    He subsequently presented to [facility] on 5/25 after being found down by family and police after a welfare check given patient did not show up for work.  Brought in by EMS with patient being very lethargic.  CT head without contrast notable for moderate intraventricular hemorrhage with large SAH that was suspicious of an aneurysm.  Patient was seen emergently at bedside by neurosurgery and underwent external ventriculostomy drain at bedside by [name] [name].  Patient was initially admitted to the [facility] service.  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  Patient then underwent a laparoscopic assisted ventriculoperitoneal shunt placement by [name] [name] and [name] [name] on 06/08/2021.  Patient was transferred to the hospitalist service on 06/10/2021.  Subjective: Patient awakened.  Primarily nodded to questions asked.  For the most part he seemed to not understand what we were asking but he did not that he was working prior to admission and nodded again in agreement that he would likely lose his insurance now that he is unable to work.  Reassured patient that given his recent stroke he would likely qualify for disability Medicaid and we are initiating that process.  Objective: Vitals:   06/30/21 0723 06/30/21 1154  BP: 110/82 (!) 129/114  Pulse: 93 (!) 104  Resp: 18 16  Temp: 97.6 F (36.4 C) 97.7 F (36.5 C)  SpO2: 99% 96%    Intake/Output Summary (Last 24 hours) at 06/30/2021 1308 Last data filed at 06/30/2021 1100 Gross per 24 hour  Intake 201 ml  Output 500 ml  Net -299 ml   Filed Weights   06/24/21 0228 06/25/21 0339 06/29/21 0500  Weight: 58.2 kg 60.3 kg 59.2 kg    Exam:  Constitutional: NAD, calm, comfortable Respiratory: clear to auscultation bilaterally, no wheezing, no crackles. Normal respiratory effort. No accessory muscle use.  Room air Cardiovascular: Regular rate and rhythm, no murmurs / rubs /  gallops. No extremity edema.  Abdomen: no tenderness, no masses palpated. Bowel sounds positive. LBM 7/4 Musculoskeletal: no clubbing / cyanosis. No joint deformity upper and lower extremities. Good ROM, no contractures. Normal muscle tone.  Neurologic: CN 2-12 grossly intact. Sensation intact, spontaneously moving all extremities.  Strength appears to be 4/5 in all extremities. Psychiatric: Normal judgment and insight. Alert and oriented x 3. Normal mood.  Documented receptive aphasia by  SLP.   Assessment/Plan: Acute problems: Subarachnoid hemorrhage 2/2 ACA aneurysm s/p coil embolization 5/26 Communicating hydrocephalus secondary to [facility] Found down at home by family and police on a welfare check given patient did not show up for work.    CT head: IVH with large SAH suspicious for aneurysm.  Neurosurgery evaluated emergently at bedside s/p EVD 5/25.  Cerebral angiogram with coil embolization ACA aneurysm on 5/26.  Underwent VP shunt on 6/14.   Completed seizure prophylactic course of Keppra. Neurosurgery recommended Outpatient follow-up PT documented continued necessity for two-person physical assistance from functional mobility  ?? Hypertension Reported history of hypertension prior to admission but was not on any medications according to the medication reconciliation Review of previous orthopedic notes and H&P's for previous surgical procedures does not document any history of hypertension Current blood pressure readings well controlled without medication  Nontraumatic brain injury related cognitive impairment with receptive aphasia Did poorly on SLUMS cognitive evaluation scoring 3/30 with speech therapist documenting significant receptive language deficits that likely impacted his performance.  Patient was documented with difficulty following one-step commands with recommendation for ongoing cognitive-linguistic and swallowing therapy with 24/7 supervision recommended at discharge OT also performed pillbox test.  He was able to read labels as first part of test but was unable to sustain attention to complete task past placement of 2 objects.  He required max cueing to complete 1 of 5 pillboxes but did not pass the pillbox test.  Again recommendation is for 24/7 supervision for medication management and overall safety due to new cognitive deficits and inability to properly perform self-care and safe decision-making with novel unfamiliar tasks   Dysphagia PEG tube for  continuous feeds-placed 6/21 in IR Dysphagia diet ordered but patient did not eat enough was changed back from nocturnal feeds to continuous Nutrition Problem: Inadequate oral intake Etiology: inability to eat Signs/Symptoms: NPO status Interventions: Tube feeding with Prosource feeding supplement; fiber supplementation and free water Body mass index is 19.27 kg/m.      Hyponatremia:  Resolved Suspect cerebral salt wasting syndrome.   Continue  NaCl tablets 1 g 3 times daily.   Consider gradual weaning of sodium replacement   Acute hypokalemia Resolved.   Physical deconditioning/ambulatory dysfunction in the setting of SAH: -- Recommendation is for SNF --Given severity of post stroke symptoms it is unlikely he will be able to immediately return to work and anticipate at least out of work x1 year, possibly permanently depending on recovery --Please be aware that November 2021 patient underwent manipulation of right shoulder under anesthesia for adhesive capsulitis   History of polysubstance abuse with EtOH/marijuana/distant history of crack cocaine -- Once mentation and aphasia improves will need to be counseled regarding cessation --No withdrawal symptoms documented during hospitalization      Data Reviewed: Basic Metabolic Panel: Recent Labs  Lab 06/29/21 0302  NA 138  K 4.2  CL 103  CO2 30  GLUCOSE 82  BUN 19  CREATININE 0.52*  CALCIUM 9.6   Liver Function Tests: Recent Labs  Lab 06/29/21 0302  AST 38  ALT 72*  ALKPHOS 59  BILITOT 0.6  PROT 6.6  ALBUMIN 3.4*   No results for input(s): LIPASE, AMYLASE in the last 168 hours. No results for input(s): AMMONIA in the last 168 hours. CBC: Recent Labs  Lab 06/29/21 0302  WBC 7.8  HGB 13.8  HCT 40.0  MCV 97.1  PLT 394   Cardiac Enzymes: No results for input(s): CKTOTAL, CKMB, CKMBINDEX, TROPONINI in the last 168 hours. BNP (last 3 results) No results for input(s): BNP in the last 8760 hours.  ProBNP  (last 3 results) No results for input(s): PROBNP in the last 8760 hours.  CBG: Recent Labs  Lab 06/29/21 1144 06/29/21 1620 06/29/21 2005 06/29/21 2351 06/30/21 0335  GLUCAP 126* 112* 115* 110* 145*    No results found for this or any previous visit (from the past 240 hour(s)).   Studies: No results found.  Scheduled Meds:  chlorhexidine  15 mL Mouth Rinse BID   Chlorhexidine Gluconate Cloth  6 each Topical Daily   feeding supplement  1 Container Oral TID BM   feeding supplement (OSMOLITE 1.5 CAL)  395 mL Per Tube TID   feeding supplement (PROSource TF)  45 mL Per Tube BID   fiber  1 packet Per Tube BID   free water  100 mL Per Tube Q4H   heparin injection (subcutaneous)  5,000 Units Subcutaneous Q8H   mouth rinse  15 mL Mouth Rinse q12n4p   nystatin  1 application Topical TID   polyethylene glycol  17 g Per Tube Daily   senna-docusate  1 tablet Per Tube Daily   sodium chloride  1 g Per Tube BID WC   Continuous Infusions:  Active Problems:   Cerebral salt-wasting   Aneurysmal subarachnoid hemorrhage (HCC)   Vasospasm of cerebral artery   Diarrheal stools   Communicating hydrocephalus (HCC)   S/P VP shunt   Receptive aphasia   S/P percutaneous endoscopic gastrostomy (PEG) tube placement [facility])   Consultants: PCCM Neurosurgery General surgery  Procedures: Bedside EVD by [name] [name] on 5/25 ACA coil embolization 5/26, [name] [name] VP shunt placement 6/14, [name] [name] and [name] [name] PEG tube placement by IR 6/21, Dr. Grace Isaac  Antibiotics: Cefazolin 5/25 - 6/2, 6/14 - 6/15, 6/21 - 6/22,   Time spent: 35 minutes    [name] [name] ANP  [facility] 7 am - 330 pm/M-F for direct patient care and secure chat Please refer to Amion for contact info 42  days

## 2021-07-01 LAB — GLUCOSE, CAPILLARY
Glucose-Capillary: 114 mg/dL — ABNORMAL HIGH (ref 70–99)
Glucose-Capillary: 123 mg/dL — ABNORMAL HIGH (ref 70–99)
Glucose-Capillary: 127 mg/dL — ABNORMAL HIGH (ref 70–99)
Glucose-Capillary: 103 mg/dL — ABNORMAL HIGH (ref 70–99)
Glucose-Capillary: 138 mg/dL — ABNORMAL HIGH (ref 70–99)

## 2021-07-01 MED ORDER — QUETIAPINE FUMARATE 25 MG PO TABS
12.5000 mg | ORAL_TABLET | Freq: Two times a day (BID) | ORAL | Status: DC
  Administered 2021-07-01 (×2): 12.5 mg via ORAL
  Filled 2021-07-01 (×2): qty 1

## 2021-07-01 MED ORDER — SENNOSIDES-DOCUSATE SODIUM 8.6-50 MG PO TABS
2.0000 | ORAL_TABLET | Freq: Two times a day (BID) | ORAL | Status: DC
  Administered 2021-07-01 – 2021-07-05 (×8): 2
  Filled 2021-07-01 (×9): qty 2

## 2021-07-01 NOTE — Progress Notes (Addendum)
2:20pm: CSW spoke attempted to reach Dewayne Hatch, Sports coach at [employer] without success - a voicemail was left requesting a return call.  11:30am: CSW was notified by Delorise Shiner at [facility] stating she was unable to obtain insurance authorizatoin for patient but did not state a reason as to why.  CSW spoke representative at [facility] to initiate insurance authorization. CSW faxed patient's clinicals and prior authorization request form to [phone].   7:40am: CSW was notified by Delorise Shiner at [facility] that the facility can extend a bed offer - Delorise Shiner to initiate insurance authorization from [facility].  Edwin Dada, MSW, LCSW Transitions of Care  Clinical Social Worker II [phone]

## 2021-07-01 NOTE — Progress Notes (Signed)
[facility] PROGRESS NOTE  [name] [name] UMP:[id number] DOB: 09/03/1961 DOA: 05/19/2021 PCP: Pcp, No  Status:  Remains inpatient appropriate because:Altered mental status and Unsafe d/c plan  Dispo: The patient is from: Home              Anticipated d/c is to: SNF-has bed offer from [place]-insurance authorization pending              Patient currently is medically stable to d/c.   Difficult to place patient Yes              Barriers to discharge: Anticipate will require prolonged rehabilitation and unclear at this point if some of this can be completed at home once he is more stable.  Currently he needs SNF placement.  Patient has private insurance from job that he is no longer able to return to.  Apparently patient had active disability in place prior to admission.  Medicaid application in process   Level of care: Progressive  Code Status: Full Family Communication: Mother Irven Easterly [phone]) updated by CM re plans for discharge once insurance authorization achieved.  Also updated per myself on same phone call regarding addition of medications to help with anxiety and behavioral issues DVT prophylaxis: Subcutaneous heparin COVID vaccination status: Pfizer 03/20/2020 and 04/08/2020-has not received first booster    HPI: 60 year old male with past medical history significant for substance abuse with marijuana/EtOH/crack cocaine. Seen in the ER initially on 5/11 for headache with negative CT imaging and was discharged with a diagnosis of sinus headache. He returned to the ER on 5/16 due to complaint of persistent headache not relieved by medication prescribed at 5/11 visit.  CT head at visit on 5/16 also unremarkable and he did not demonstrate any focal neurological signs at that visit as well.    He subsequently presented to [facility] on 5/25 after being found down by family and police after a welfare check given patient did not show up for work.  Brought in by EMS with  patient being very lethargic.  CT head without contrast notable for moderate intraventricular hemorrhage with large SAH that was suspicious of an aneurysm.  Patient was seen emergently at bedside by neurosurgery and underwent external ventriculostomy drain at bedside by Dr. Wynetta Emery.  Patient was initially admitted to the [facility] service.  Patient underwent diagnostic cerebral angiogram by neurosurgery, Dr. Conchita Paris on 05/20/2021 with successful coil embolization of the right A1-A2 junction aneurysm.  Patient then underwent a laparoscopic assisted ventriculoperitoneal shunt placement by Dr. Conchita Paris and Dr. Janee Morn on 06/08/2021.  Patient was transferred to the hospitalist service on 06/10/2021.  Subjective: Awake and malpositioned in the bed.  Restless and appears to be trying to get out of bed unassisted.  Remains very confused and having difficulty answering questions.  Somewhat difficult to redirect.  Objective: Vitals:   06/30/21 2351 07/01/21 0343  BP: 106/84 (!) 126/91  Pulse: 88 87  Resp: 17 17  Temp: 98 F (36.7 C) 98 F (36.7 C)  SpO2: 100% 100%    Intake/Output Summary (Last 24 hours) at 07/01/2021 0803 Last data filed at 06/30/2021 2038 Gross per 24 hour  Intake 441 ml  Output 1 ml  Net 440 ml   Filed Weights   06/25/21 0339 06/29/21 0500 07/01/21 0500  Weight: 60.3 kg 59.2 kg 59.9 kg    Exam:  Constitutional: Restless but with examination does not exhibit any signs of pain. Respiratory: Lung sounds remain clear.  He is stable on  room air.  His oximetry between 95 and 100% Cardiovascular: S1-S2, no peripheral edema, normotensive, regular pulse Abdomen: LBM 7/4, oral intake remains poor.  PEG tube in place with continuous tube feedings.  Normoactive bowel sounds. Musculoskeletal: no clubbing / cyanosis. No joint deformity upper and lower extremities. Good ROM, no contractures. Normal muscle tone.  Neurologic: CN 2-12 grossly intact. Sensation intact, spontaneously moving all  extremities.  Strength appears to be 4/5 in all extremities. Psychiatric: Alert and restless, difficult to redirect.  Attempts to answer orientation questions but not accurate.  Unclear how much of this is related to receptive aphasia versus confusion versus both   Assessment/Plan: Acute problems: Subarachnoid hemorrhage 2/2 ACA aneurysm s/p coil embolization 5/26 Communicating hydrocephalus secondary to [facility] Found down at home by family and police on a welfare check given patient did not show up for work.    CT head: IVH with large SAH suspicious for aneurysm.  Neurosurgery evaluated emergently at bedside s/p EVD 5/25.  Cerebral angiogram with coil embolization ACA aneurysm on 5/26.  Underwent VP shunt on 6/14.   Completed seizure prophylactic course of Keppra. Neurosurgery recommended Outpatient follow-up PT documented continued necessity for two-person physical assistance from functional mobility  Nontraumatic brain injury related cognitive impairment with receptive aphasia SLP documents poor cognitive functioning likely impacted by receptive aphasia OT documents poor cognitive functioning with inability to perform functional ADL Patient very restless today.  He does have a history of prior polysubstance abuse.  To decrease fall risk we will start low-dose Seroquel 12.5 mg twice daily and adjust as needed.  Likely will require higher nighttime dose   Dysphagia PEG tube for continuous feeds-placed 6/21 in IR Dysphagia diet ordered but patient did not eat enough so was changed back to continuous feeds Nutrition Problem: Inadequate oral intake Etiology: inability to eat Signs/Symptoms: NPO status Interventions: Tube feeding with Prosource feeding supplement; fiber supplementation and free water Body mass index is 19.5 kg/m.    Hyponatremia:  Resolved Suspect cerebral salt wasting syndrome.   Continue  NaCl tablets 1 g 3 times daily.   Consider gradual weaning of sodium replacement    Acute hypokalemia Resolved.   Physical deconditioning/ambulatory dysfunction in the setting of SAH: -- Recommendation is for SNF --Given severity of post stroke symptoms it is unlikely he will be able to immediately return to work and anticipate at least out of work x1 year, possibly permanently depending on recovery --Please be aware that November 2021 patient underwent manipulation of right shoulder under anesthesia for adhesive capsulitis   History of polysubstance abuse with EtOH/marijuana/distant history of crack cocaine -- Once mentation and aphasia improves will need to be counseled regarding cessation --No withdrawal symptoms documented during hospitalization      Data Reviewed: Basic Metabolic Panel: Recent Labs  Lab 06/29/21 0302  NA 138  K 4.2  CL 103  CO2 30  GLUCOSE 82  BUN 19  CREATININE 0.52*  CALCIUM 9.6   Liver Function Tests: Recent Labs  Lab 06/29/21 0302  AST 38  ALT 72*  ALKPHOS 59  BILITOT 0.6  PROT 6.6  ALBUMIN 3.4*   No results for input(s): LIPASE, AMYLASE in the last 168 hours. No results for input(s): AMMONIA in the last 168 hours. CBC: Recent Labs  Lab 06/29/21 0302  WBC 7.8  HGB 13.8  HCT 40.0  MCV 97.1  PLT 394   Cardiac Enzymes: No results for input(s): CKTOTAL, CKMB, CKMBINDEX, TROPONINI in the last 168 hours. BNP (last 3  results) No results for input(s): BNP in the last 8760 hours.  ProBNP (last 3 results) No results for input(s): PROBNP in the last 8760 hours.  CBG: Recent Labs  Lab 06/29/21 2351 06/30/21 0335 06/30/21 2043 06/30/21 2349 07/01/21 0340  GLUCAP 110* 145* 109* 92 114*    No results found for this or any previous visit (from the past 240 hour(s)).   Studies: No results found.  Scheduled Meds:  chlorhexidine  15 mL Mouth Rinse BID   Chlorhexidine Gluconate Cloth  6 each Topical Daily   feeding supplement  1 Container Oral TID BM   feeding supplement (OSMOLITE 1.5 CAL)  395 mL Per Tube TID    feeding supplement (PROSource TF)  45 mL Per Tube BID   fiber  1 packet Per Tube BID   free water  100 mL Per Tube Q4H   heparin injection (subcutaneous)  5,000 Units Subcutaneous Q8H   mouth rinse  15 mL Mouth Rinse q12n4p   nystatin  1 application Topical TID   polyethylene glycol  17 g Per Tube Daily   senna-docusate  1 tablet Per Tube Daily   sodium chloride  1 g Per Tube BID WC   Continuous Infusions:  Active Problems:   Cerebral salt-wasting   Aneurysmal subarachnoid hemorrhage (HCC)   Vasospasm of cerebral artery   Diarrheal stools   Communicating hydrocephalus (HCC)   S/P VP shunt   Receptive aphasia   S/P percutaneous endoscopic gastrostomy (PEG) tube placement [facility])   Consultants: PCCM Neurosurgery General surgery  Procedures: Bedside EVD by Dr. Wynetta Emery on 5/25 ACA coil embolization 5/26, Dr. Conchita Paris VP shunt placement 6/14, Dr. Conchita Paris and Dr. Janee Morn PEG tube placement by IR 6/21, Dr. Grace Isaac  Antibiotics: Cefazolin 5/25 - 6/2, 6/14 - 6/15, 6/21 - 6/22,   Time spent: 35 minutes    Junious Silk ANP  [facility] 7 am - 330 pm/M-F for direct patient care and secure chat Please refer to Amion for contact info 43  days

## 2021-07-01 NOTE — Progress Notes (Signed)
Physical Therapy Treatment Patient Details Name: [name] [name] MRN: [id number] DOB: 1961/08/25 Today's Date: 07/01/2021    History of Present Illness Pt is a 60 y.o. M who was admitted to ICU 05/19/21 with IVH with large acute SAH now s/p ventriculostomy. 5/26 coil embolization. 5/29 worsening neurological exam consistent with vasospasm. VP shunt placement 6/14. Significant PMH: rotator cuff tear, tobacco, marijuana and ETOH use, remote hx crack cocaine use.    PT Comments    Pt was very lethargic today, maintaining his eyes closed for most of the session. He continues to require heavy physical assistance for bed mobility. He refused further mobility once sitting at EOB and frequently attempting to lie back down once sitting up. Pt would continue to benefit from skilled physical therapy services at this time while admitted and after d/c to address the below listed limitations in order to improve overall safety and independence with functional mobility.    Follow Up Recommendations  SNF     Equipment Recommendations  Other (comment) (defer to next venue of care)    Recommendations for Other Services       Precautions / Restrictions Precautions Precautions: Fall;Other (comment) Precaution Comments: PEG Restrictions Weight Bearing Restrictions: No    Mobility  Bed Mobility Overal bed mobility: Needs Assistance Bed Mobility: Supine to Sit;Sit to Supine     Supine to sit: HOB elevated;Mod assist Sit to supine: Max assist;+2 for physical assistance   General bed mobility comments: increased time and effort needed, assistance for bilateral LE movement off of bed, pt able to initiate trunk elevation to achieve an upright sitting position at EOB; max A x2 to return to supine and position in bed    Transfers                 General transfer comment: pt lethargic and declining further mobility  Ambulation/Gait                 Stairs             Wheelchair  Mobility    Modified Rankin (Stroke Patients Only) Modified Rankin (Stroke Patients Only) Pre-Morbid Rankin Score: No symptoms Modified Rankin: Severe disability     Balance Overall balance assessment: Needs assistance Sitting-balance support: Feet supported Sitting balance-Leahy Scale: Poor                                      Cognition Arousal/Alertness: Lethargic Behavior During Therapy: Flat affect Overall Cognitive Status: Impaired/Different from baseline Area of Impairment: Orientation;Attention;Memory;Following commands;Safety/judgement;Awareness;Problem solving                 Orientation Level: Disoriented to;Situation;Time Current Attention Level: Sustained Memory: Decreased short-term memory Following Commands: Follows one step commands inconsistently;Follows one step commands with increased time Safety/Judgement: Decreased awareness of deficits;Decreased awareness of safety Awareness: Intellectual Problem Solving: Slow processing;Decreased initiation;Difficulty sequencing;Requires verbal cues;Requires tactile cues        Exercises      General Comments        Pertinent Vitals/Pain Pain Assessment: Faces Faces Pain Scale: No hurt    Home Living                      Prior Function            PT Goals (current goals can now be found in the care plan section) Acute Rehab PT Goals PT  Goal Formulation: With patient/family Time For Goal Achievement: 07/13/21 Potential to Achieve Goals: Fair Progress towards PT goals: Progressing toward goals    Frequency    Min 2X/week      PT Plan Current plan remains appropriate    Co-evaluation              AM-PAC PT "6 Clicks" Mobility   Outcome Measure  Help needed turning from your back to your side while in a flat bed without using bedrails?: A Lot Help needed moving from lying on your back to sitting on the side of a flat bed without using bedrails?: A Lot Help  needed moving to and from a bed to a chair (including a wheelchair)?: A Lot Help needed standing up from a chair using your arms (e.g., wheelchair or bedside chair)?: A Lot Help needed to walk in hospital room?: Total Help needed climbing 3-5 steps with a railing? : Total 6 Click Score: 10    End of Session   Activity Tolerance: Patient limited by lethargy Patient left: in bed;with call bell/phone within reach;with bed alarm set Nurse Communication: Mobility status PT Visit Diagnosis: Unsteadiness on feet (R26.81);Muscle weakness (generalized) (M62.81);Difficulty in walking, not elsewhere classified (R26.2);Other abnormalities of gait and mobility (R26.89)     Time: 1141-1155 PT Time Calculation (min) (ACUTE ONLY): 14 min  Charges:  $Therapeutic Activity: 8-22 mins                     Arletta Bale, DPT  Acute Rehabilitation Services Office [phone]    Alessandra Bevels [name] 07/01/2021, 12:55 PM

## 2021-07-02 LAB — GLUCOSE, CAPILLARY
Glucose-Capillary: 102 mg/dL — ABNORMAL HIGH (ref 70–99)
Glucose-Capillary: 107 mg/dL — ABNORMAL HIGH (ref 70–99)
Glucose-Capillary: 111 mg/dL — ABNORMAL HIGH (ref 70–99)
Glucose-Capillary: 169 mg/dL — ABNORMAL HIGH (ref 70–99)
Glucose-Capillary: 189 mg/dL — ABNORMAL HIGH (ref 70–99)
Glucose-Capillary: 91 mg/dL (ref 70–99)

## 2021-07-02 MED ORDER — QUETIAPINE FUMARATE 25 MG PO TABS
25.0000 mg | ORAL_TABLET | Freq: Two times a day (BID) | ORAL | Status: DC
  Administered 2021-07-02 – 2021-07-03 (×3): 25 mg via ORAL
  Filled 2021-07-02 (×3): qty 1

## 2021-07-02 NOTE — Progress Notes (Signed)
[facility] PROGRESS NOTE  [name] [name]:[id number] DOB: January 16, 1961 DOA: 05/19/2021 PCP: Pcp, No  Status:  Remains inpatient appropriate because:Altered mental status and Unsafe d/c plan  Dispo: The patient is from: Home              Anticipated d/c is to: SNF-has bed offer from [place]-insurance authorization pending              Patient currently is medically stable to d/c.   Difficult to place patient Yes              Barriers to discharge: Anticipate will require prolonged rehabilitation and unclear at this point if some of this can be completed at home once he is more stable.  Currently he needs SNF placement.  Patient has private insurance from job that he is no longer able to return to.  Apparently patient had active disability in place prior to admission.  Medicaid application in process   Level of care: Progressive  Code Status: Full Family Communication: Mother Irven Easterly [phone]) updated by CM re plans for discharge once insurance authorization achieved.  Also updated per myself on same phone call regarding addition of medications to help with anxiety and behavioral issues DVT prophylaxis: Subcutaneous heparin COVID vaccination status: Pfizer 03/20/2020 and 04/08/2020-has not received first booster    HPI: 60 year old male with past medical history significant for substance abuse with marijuana/EtOH/crack cocaine. Seen in the ER initially on 5/11 for headache with negative CT imaging and was discharged with a diagnosis of sinus headache. He returned to the ER on 5/16 due to complaint of persistent headache not relieved by medication prescribed at 5/11 visit.  CT head at visit on 5/16 also unremarkable and he did not demonstrate any focal neurological signs at that visit as well.    He subsequently presented to [facility] on 5/25 after being found down by family and police after a welfare check given patient did not show up for work.  Brought in by EMS with  patient being very lethargic.  CT head without contrast notable for moderate intraventricular hemorrhage with large SAH that was suspicious of an aneurysm.  Patient was seen emergently at bedside by neurosurgery and underwent external ventriculostomy drain at bedside by Dr. Wynetta Emery.  Patient was initially admitted to the [facility] service.  Patient underwent diagnostic cerebral angiogram by neurosurgery, Dr. Conchita Paris on 05/20/2021 with successful coil embolization of the right A1-A2 junction aneurysm.  Patient then underwent a laparoscopic assisted ventriculoperitoneal shunt placement by Dr. Conchita Paris and Dr. Janee Morn on 06/08/2021.  Patient was transferred to the hospitalist service on 06/10/2021.  Subjective: Awake.  Much less restless today.  Initially seem to be more appropriate in responses but as her conversation persisted he began saying things that were not consistent or appropriate to the conversation at hand.  When I asked him if his anxiety was better since I started the medication he said yes reported "it could be better".  Objective: Vitals:   07/02/21 0343 07/02/21 0741  BP: 115/82 125/86  Pulse: 91 86  Resp: 17 17  Temp: 98.8 F (37.1 C) 97.8 F (36.6 C)  SpO2: 99% 100%    Intake/Output Summary (Last 24 hours) at 07/02/2021 0800 Last data filed at 07/02/2021 0745 Gross per 24 hour  Intake 240 ml  Output 900 ml  Net -660 ml   Filed Weights   06/25/21 0339 06/29/21 0500 07/01/21 0500  Weight: 60.3 kg 59.2 kg 59.9 kg  Exam:  Constitutional: Calm and in no acute distress Respiratory: Anterior lung sounds are clear to auscultation.  Stable on room air Cardiovascular: Normal heart sounds, regular pulse, no peripheral edema Abdomen: LBM 7/4,  Musculoskeletal: no clubbing / cyanosis. No joint deformity upper and lower extremities. Good ROM, no contractures. Normal muscle tone.  Neurologic: CN 2-12 grossly intact. Sensation intact, spontaneously moving all extremities.  Strength appears  to be 4/5 in all extremities. Psychiatric: Alert but not oriented.  Less restless and pleasant today.   Assessment/Plan: Acute problems: Subarachnoid hemorrhage 2/2 ACA aneurysm s/p coil embolization 5/26 Communicating hydrocephalus secondary to [facility] Found down at home by family and police on a welfare check given patient did not show up for work.    CT head: IVH with large SAH suspicious for aneurysm.  Neurosurgery evaluated emergently at bedside s/p EVD 5/25.  Cerebral angiogram with coil embolization ACA aneurysm on 5/26.  Underwent VP shunt on 6/14.   Completed seizure prophylactic course of Keppra. Neurosurgery recommended Outpatient follow-up PT documented continued necessity for two-person physical assistance from functional mobility  Nontraumatic brain injury related cognitive impairment with receptive aphasia SLP documents poor cognitive functioning likely impacted by receptive aphasia OT documents poor cognitive functioning with inability to perform functional ADL Patient very restless today.  He does have a history of prior polysubstance abuse.  Behaviors better on Seroquel but will increase to 25 mg BID   Dysphagia PEG tube for continuous feeds-placed 6/21 in IR Dysphagia diet ordered but patient did not eat enough so was changed back to continuous feeds; unfortunately continues with poor oral intake Nutrition Problem: Inadequate oral intake Etiology: inability to eat Signs/Symptoms: NPO status Interventions: Tube feeding with Prosource feeding supplement; fiber supplementation and free water Body mass index is 19.5 kg/m.    Hyponatremia:  Resolved Suspect cerebral salt wasting syndrome.   Continue  NaCl tablets 1 g 3 times daily.   Consider gradual weaning of sodium replacement Check BMET 7/9   Acute hypokalemia Resolved.   Physical deconditioning/ambulatory dysfunction in the setting of SAH: -- Recommendation is for SNF --Given severity of post stroke symptoms  it is unlikely he will be able to immediately return to work and anticipate at least out of work x1 year, possibly permanently depending on recovery --Please be aware that November 2021 patient underwent manipulation of right shoulder under anesthesia for adhesive capsulitis   History of polysubstance abuse with EtOH/marijuana/distant history of crack cocaine -- Once mentation and aphasia improves will need to be counseled regarding cessation --No withdrawal symptoms documented during hospitalization      Data Reviewed: Basic Metabolic Panel: Recent Labs  Lab 06/29/21 0302  NA 138  K 4.2  CL 103  CO2 30  GLUCOSE 82  BUN 19  CREATININE 0.52*  CALCIUM 9.6   Liver Function Tests: Recent Labs  Lab 06/29/21 0302  AST 38  ALT 72*  ALKPHOS 59  BILITOT 0.6  PROT 6.6  ALBUMIN 3.4*   No results for input(s): LIPASE, AMYLASE in the last 168 hours. No results for input(s): AMMONIA in the last 168 hours. CBC: Recent Labs  Lab 06/29/21 0302  WBC 7.8  HGB 13.8  HCT 40.0  MCV 97.1  PLT 394   Cardiac Enzymes: No results for input(s): CKTOTAL, CKMB, CKMBINDEX, TROPONINI in the last 168 hours. BNP (last 3 results) No results for input(s): BNP in the last 8760 hours.  ProBNP (last 3 results) No results for input(s): PROBNP in the last 8760 hours.  CBG: Recent Labs  Lab 07/01/21 1256 07/01/21 1622 07/01/21 2029 07/02/21 0004 07/02/21 0342  GLUCAP 127* 103* 138* 107* 102*    No results found for this or any previous visit (from the past 240 hour(s)).   Studies: No results found.  Scheduled Meds:  chlorhexidine  15 mL Mouth Rinse BID   Chlorhexidine Gluconate Cloth  6 each Topical Daily   feeding supplement  1 Container Oral TID BM   feeding supplement (OSMOLITE 1.5 CAL)  395 mL Per Tube TID   feeding supplement (PROSource TF)  45 mL Per Tube BID   fiber  1 packet Per Tube BID   free water  100 mL Per Tube Q4H   heparin injection (subcutaneous)  5,000 Units  Subcutaneous Q8H   mouth rinse  15 mL Mouth Rinse q12n4p   nystatin  1 application Topical TID   polyethylene glycol  17 g Per Tube Daily   QUEtiapine  12.5 mg Oral BID   senna-docusate  2 tablet Per Tube BID   sodium chloride  1 g Per Tube BID WC   Continuous Infusions:  Active Problems:   Cerebral salt-wasting   Aneurysmal subarachnoid hemorrhage (HCC)   Vasospasm of cerebral artery   Diarrheal stools   Communicating hydrocephalus (HCC)   S/P VP shunt   Receptive aphasia   S/P percutaneous endoscopic gastrostomy (PEG) tube placement [facility])   Consultants: PCCM Neurosurgery General surgery  Procedures: Bedside EVD by Dr. Wynetta Emery on 5/25 ACA coil embolization 5/26, Dr. Conchita Paris VP shunt placement 6/14, Dr. Conchita Paris and Dr. Janee Morn PEG tube placement by IR 6/21, Dr. Grace Isaac  Antibiotics: Cefazolin 5/25 - 6/2, 6/14 - 6/15, 6/21 - 6/22,   Time spent: 35 minutes    [name] [name] ANP  [facility] 7 am - 330 pm/M-F for direct patient care and secure chat Please refer to Amion for contact info 44  days

## 2021-07-02 NOTE — Progress Notes (Signed)
Occupational Therapy Treatment Patient Details Name: [name] [name] MRN: [id number] DOB: 1961-10-09 Today's Date: 07/02/2021    History of present illness 60 y.o. male who was admitted to ICU 05/19/21 with IVH with large acute SAH now s/p ventriculostomy. 5/26 coil embolization. 5/29 worsening neurological exam consistent with vasospasm. VP shunt placement 6/14. Significant PMH: rotator cuff tear, tobacco, marijuana and ETOH use, remote hx crack cocaine use.   OT comments  Pt progressing towards established OT goals. Pt performing self feeding with min A for sitting balance. Performing squat pivot transfer with max A. Pt closing eyes frequently during session, requiring verbal cues to attend to task. Pt continues to present with decreased activity tolerance, cognition, and sitting balance. Recommend discharge with OT at SNF and will continue to follow acutely to optimize independence in ADLs.    Follow Up Recommendations  SNF    Equipment Recommendations  Wheelchair (measurements OT);Wheelchair cushion (measurements OT);Hospital bed    Recommendations for Other Services      Precautions / Restrictions Precautions Precautions: Fall;Other (comment) Precaution Comments: PEG Restrictions Weight Bearing Restrictions: No       Mobility Bed Mobility Overal bed mobility: Needs Assistance Bed Mobility: Supine to Sit     Supine to sit: HOB elevated;+2 for safety/equipment;+2 for physical assistance;Max assist     General bed mobility comments: Increased time and effort. Assist to bring BLE off EOB and assist to elevate trunk.    Transfers Overall transfer level: Needs assistance   Transfers: Squat Pivot Transfers           General transfer comment: Pt agreeable to sitting in recliner. Performing squat pivot transfer with max A.    Balance Overall balance assessment: Needs assistance Sitting-balance support: Feet supported;Single extremity supported;Bilateral upper extremity  supported Sitting balance-Leahy Scale: Poor Sitting balance - Comments: Pt requiring external support to maintain sitting balance Postural control: Posterior lean;Right lateral lean Standing balance support: Bilateral upper extremity supported;During functional activity Standing balance-Leahy Scale: Zero Standing balance comment: Max A for squat pivot transfer                           ADL either performed or assessed with clinical judgement   ADL Overall ADL's : Needs assistance/impaired Eating/Feeding: Minimal assistance;Sitting Eating/Feeding Details (indicate cue type and reason): Min A for maintianing midline posture. Grooming: Therapist, nutritional;Set up;Cueing for sequencing Grooming Details (indicate cue type and reason): cueing to wash entire face         Upper Body Dressing : Cueing for sequencing;Sitting;Moderate assistance Upper Body Dressing Details (indicate cue type and reason): Mod A and max verbal cues to don gown.       Toilet Transfer Details (indicate cue type and reason): Squat pivot transfer to chair simulatig toilet transfer.         Functional mobility during ADLs: Maximal assistance;+2 for physical assistance;+2 for safety/equipment;Cueing for sequencing;Cueing for safety General ADL Comments: Performing squat pivot transfer to recliner with max A. Pt performing self feeding with min A to maintain upright posture and cues for initiation.     Vision   Vision Assessment?: Vision impaired- to be further tested in functional context   Perception     Praxis      Cognition Arousal/Alertness: Lethargic Behavior During Therapy: Flat affect Overall Cognitive Status: Impaired/Different from baseline Area of Impairment: Orientation;Attention;Memory;Following commands;Safety/judgement;Awareness;Problem solving                 Orientation Level:  Disoriented to;Situation;Time Current Attention Level: Sustained Memory: Decreased short-term  memory Following Commands: Follows one step commands inconsistently;Follows one step commands with increased time Safety/Judgement: Decreased awareness of deficits;Decreased awareness of safety Awareness: Intellectual Problem Solving: Slow processing;Decreased initiation;Difficulty sequencing;Requires verbal cues;Requires tactile cues General Comments: Pt requiring cues for for initiating tasks, attention, and sequencing during session. Pt attenting for short periods of time with self feeding.        Exercises     Shoulder Instructions       General Comments Pt with greater attention this session.    Pertinent Vitals/ Pain       Pain Assessment: Faces Faces Pain Scale: Hurts a little bit Pain Location: general discomfort Pain Descriptors / Indicators: Discomfort Pain Intervention(s): Limited activity within patient's tolerance;Monitored during session;Repositioned  Home Living          Prior Functioning/Environment              Frequency  Min 2X/week        Progress Toward Goals  OT Goals(current goals can now be found in the care plan section)  Progress towards OT goals: Progressing toward goals  Acute Rehab OT Goals Patient Stated Goal: unable to state OT Goal Formulation: Patient unable to participate in goal setting Time For Goal Achievement: 07/13/21 Potential to Achieve Goals: Good ADL Goals Pt Will Perform Grooming: with min assist;sitting Pt Will Perform Lower Body Dressing: with max assist;sitting/lateral leans;sit to/from stand Pt Will Perform Toileting - Clothing Manipulation and hygiene: with mod assist;sit to/from stand Pt/caregiver will Perform Home Exercise Program: Increased ROM;Increased strength;Both right and left upper extremity;With minimal assist;With written HEP provided Additional ADL Goal #1: Pt will follow 1-2 step commands with minimal cues to attend to task in 3/5 trials. Additional ADL Goal #2: Pt will utilize strategies for L  inattention in order to participate with tasks on L side with minimal cues in 3/5 trials.  Plan Discharge plan remains appropriate    Co-evaluation         AM-PAC OT "6 Clicks" Daily Activity     Outcome Measure   Help from another person eating meals?: A Little Help from another person taking care of personal grooming?: A Little Help from another person toileting, which includes using toliet, bedpan, or urinal?: Total Help from another person bathing (including washing, rinsing, drying)?: Total Help from another person to put on and taking off regular upper body clothing?: A Lot Help from another person to put on and taking off regular lower body clothing?: Total 6 Click Score: 11    End of Session    OT Visit Diagnosis: Unsteadiness on feet (R26.81);Muscle weakness (generalized) (M62.81);Other symptoms and signs involving cognitive function   Activity Tolerance Patient tolerated treatment well   Patient Left in chair;with call bell/phone within reach;with chair alarm set   Nurse Communication Mobility status        Time: 1451-1526 OT Time Calculation (min): 35 min  Charges: OT General Charges $OT Visit: 1 Visit OT Treatments $Self Care/Home Management : 23-37 mins  Ladene Artist, OTDS    Ladene Artist 07/02/2021, 4:43 PM

## 2021-07-02 NOTE — Progress Notes (Addendum)
1:45pm: CSW spoke with [employer] representative who states this patient's insurance policy is no longer active and has been terminated. CSW asked for a specific date that the policy was terminated and representative would not release that information.  CSW spoke with [employer] representative to verify patient's benefits - this plan is currently active.   CSW notified Delorise Shiner at [facility] who will initiate authorization from [place].  8am: CSW submitted PT note and Tax ID # for facility to [facility] for insurance authorization.  Edwin Dada, MSW, LCSW Transitions of Care  Clinical Social Worker II [phone]

## 2021-07-03 LAB — BASIC METABOLIC PANEL
Anion gap: 9 (ref 5–15)
BUN: 12 mg/dL (ref 6–20)
CO2: 26 mmol/L (ref 22–32)
Calcium: 9.5 mg/dL (ref 8.9–10.3)
Chloride: 101 mmol/L (ref 98–111)
Creatinine, Ser: 0.52 mg/dL — ABNORMAL LOW (ref 0.61–1.24)
GFR, Estimated: >60 mL/min (ref >60)
Glucose, Bld: 103 mg/dL — ABNORMAL HIGH (ref 70–99)
Potassium: 3.8 mmol/L (ref 3.5–5.1)
Sodium: 136 mmol/L (ref 135–145)

## 2021-07-03 LAB — GLUCOSE, CAPILLARY
Glucose-Capillary: 102 mg/dL — ABNORMAL HIGH (ref 70–99)
Glucose-Capillary: 107 mg/dL — ABNORMAL HIGH (ref 70–99)
Glucose-Capillary: 108 mg/dL — ABNORMAL HIGH (ref 70–99)
Glucose-Capillary: 113 mg/dL — ABNORMAL HIGH (ref 70–99)
Glucose-Capillary: 139 mg/dL — ABNORMAL HIGH (ref 70–99)
Glucose-Capillary: 94 mg/dL (ref 70–99)

## 2021-07-03 MED ORDER — QUETIAPINE FUMARATE 25 MG PO TABS
12.5000 mg | ORAL_TABLET | Freq: Two times a day (BID) | ORAL | Status: DC
  Administered 2021-07-03 – 2021-07-05 (×5): 12.5 mg via ORAL
  Filled 2021-07-03 (×5): qty 1

## 2021-07-03 NOTE — Progress Notes (Signed)
Patient seen and examined, 60 year old male with history of polysubstance and alcohol abuse admitted with subarachnoid hemorrhage underwent ACA aneurysm coil embolization, hydrocephalus etc. status post EVD, status post VP shunt -With resultant brain injury, cognitive impairment, receptive aphasia -Will need prolonged rehabilitation -Increased daytime somnolence will decrease a.m. Seroquel dose  Zannie Cove, MD

## 2021-07-03 NOTE — Plan of Care (Signed)
  Problem: Education: Goal: Knowledge of General Education information will improve Description: Including pain rating scale, medication(s)/side effects and non-pharmacologic comfort measures Outcome: Progressing   Problem: Health Behavior/Discharge Planning: Goal: Ability to manage health-related needs will improve Outcome: Progressing   Problem: Clinical Measurements: Goal: Ability to maintain clinical measurements within normal limits will improve Outcome: Progressing Goal: Will remain free from infection Outcome: Progressing Goal: Diagnostic test results will improve Outcome: Progressing Goal: Respiratory complications will improve Outcome: Progressing Goal: Cardiovascular complication will be avoided Outcome: Progressing   Problem: Activity: Goal: Risk for activity intolerance will decrease Outcome: Progressing   Problem: Nutrition: Goal: Adequate nutrition will be maintained Outcome: Progressing   Problem: Coping: Goal: Level of anxiety will decrease Outcome: Progressing   Problem: Elimination: Goal: Will not experience complications related to bowel motility Outcome: Progressing Goal: Will not experience complications related to urinary retention Outcome: Progressing   Problem: Pain Managment: Goal: General experience of comfort will improve Outcome: Progressing   Problem: Safety: Goal: Ability to remain free from injury will improve Outcome: Progressing   Problem: Skin Integrity: Goal: Risk for impaired skin integrity will decrease Outcome: Progressing   Problem: Education: Goal: Knowledge of disease or condition will improve Outcome: Progressing Goal: Knowledge of secondary prevention will improve Outcome: Progressing Goal: Knowledge of patient specific risk factors addressed and post discharge goals established will improve Outcome: Progressing Goal: Individualized Educational Video(s) Outcome: Progressing   Problem: Safety: Goal: Non-violent  Restraint(s) Outcome: Progressing   

## 2021-07-04 LAB — GLUCOSE, CAPILLARY
Glucose-Capillary: 101 mg/dL — ABNORMAL HIGH (ref 70–99)
Glucose-Capillary: 107 mg/dL — ABNORMAL HIGH (ref 70–99)
Glucose-Capillary: 108 mg/dL — ABNORMAL HIGH (ref 70–99)
Glucose-Capillary: 111 mg/dL — ABNORMAL HIGH (ref 70–99)
Glucose-Capillary: 147 mg/dL — ABNORMAL HIGH (ref 70–99)
Glucose-Capillary: 162 mg/dL — ABNORMAL HIGH (ref 70–99)

## 2021-07-04 NOTE — Progress Notes (Signed)
Patient seen and examined, please see detailed note by Junious Silk, NP Briefly 60/M with history of polysubstance and alcohol abuse admitted with subarachnoid hemorrhage underwent ACA aneurysm coil embolization, hydrocephalus etc. status post EVD, status post VP shunt -With resultant brain injury, cognitive impairment, receptive aphasia -Will need prolonged rehabilitation -Decreased his Seroquel dose yesterday due to increased daytime somnolence, appears stable, able to feed himself few bites  Zannie Cove, MD

## 2021-07-05 DIAGNOSIS — R1314 Dysphagia, pharyngoesophageal phase: Secondary | ICD-10-CM

## 2021-07-05 DIAGNOSIS — R4189 Other symptoms and signs involving cognitive functions and awareness: Secondary | ICD-10-CM

## 2021-07-05 DIAGNOSIS — R262 Difficulty in walking, not elsewhere classified: Secondary | ICD-10-CM | POA: Diagnosis not present

## 2021-07-05 LAB — GLUCOSE, CAPILLARY
Glucose-Capillary: 106 mg/dL — ABNORMAL HIGH (ref 70–99)
Glucose-Capillary: 108 mg/dL — ABNORMAL HIGH (ref 70–99)
Glucose-Capillary: 121 mg/dL — ABNORMAL HIGH (ref 70–99)
Glucose-Capillary: 102 mg/dL — ABNORMAL HIGH (ref 70–99)
Glucose-Capillary: 144 mg/dL — ABNORMAL HIGH (ref 70–99)

## 2021-07-05 MED ORDER — QUETIAPINE FUMARATE 25 MG PO TABS: 12.5000 mg | ORAL_TABLET | Freq: Two times a day (BID) | ORAL | Status: DC

## 2021-07-05 MED ORDER — FREE WATER: 100.0000 mL | Status: DC

## 2021-07-05 MED ORDER — OSMOLITE 1.5 CAL PO LIQD: 395.0000 mL | Freq: Three times a day (TID) | ORAL | 0 refills | Status: DC

## 2021-07-05 MED ORDER — POLYETHYLENE GLYCOL 3350 17 G PO PACK: 17.0000 g | PACK | Freq: Every day | ORAL | 0 refills | Status: DC

## 2021-07-05 MED ORDER — SENNOSIDES-DOCUSATE SODIUM 8.6-50 MG PO TABS: 2.0000 | ORAL_TABLET | Freq: Two times a day (BID) | ORAL | Status: DC

## 2021-07-05 MED ORDER — SODIUM CHLORIDE 1 G PO TABS: 1.0000 g | ORAL_TABLET | Freq: Two times a day (BID) | ORAL | Status: DC

## 2021-07-05 MED ORDER — NYSTATIN 100000 UNIT/GM EX POWD: 1.0000 "application " | Freq: Three times a day (TID) | CUTANEOUS | 0 refills | Status: DC

## 2021-07-05 MED ORDER — PROSOURCE TF PO LIQD: 45.0000 mL | Freq: Two times a day (BID) | ORAL | Status: DC

## 2021-07-05 MED ORDER — ACETAMINOPHEN 325 MG PO TABS: 650.0000 mg | ORAL_TABLET | ORAL | Status: DC | PRN

## 2021-07-05 MED ORDER — NUTRISOURCE FIBER PO PACK: 1.0000 | PACK | Freq: Two times a day (BID) | ORAL | Status: DC

## 2021-07-05 NOTE — Progress Notes (Signed)
[facility] arrived shortly ago to transport patient. Evening meds completed except for osmolite feeding supplement and fiber. Called [place] to update this, but could not reach RN. [facility] updated on this. Pt in route to facility.

## 2021-07-05 NOTE — Discharge Summary (Signed)
Physician Discharge Summary  CUTTER PASSEY ZSW:[id number] DOB: [birth year] DOA: 05/19/2021  PCP: Pcp, No  Admit date: 05/19/2021 Discharge date: 07/05/2021  Time spent: 35 minutes  Recommendations for Outpatient Follow-up:  Patient will only need to follow-up with the general surgery practice if there are are any concerns or questions regarding the laparoscopic surgery or incisions related to the same surgery. Patient will need to follow-up at least 1 time with neurosurgeon Dr. Conchita Paris regarding VP shunt placement during this admission. Patient will be discharging to [facility], SNF He will need an electrolyte panel in 1 week after discharge.  He remains on salt replacement tablets and will need to have his sodium followed regularly after discharge. Patient will need to be followed by speech therapy after discharge.  He has been cleared for a dysphagia 3 diet with thin liquids but has been not eating enough to maintain intake.  He has been switched from continuous tube feedings to boluses of Osmolite 1.5 Cal 395 cc TID (pharmacist may replace with alternate tube feeding if necessary) He will also need to continue PT and OT for rehabilitative therapy   Discharge Diagnoses:  Active Problems:   Hyponatremia   Aneurysmal subarachnoid hemorrhage (HCC)   Vasospasm of cerebral artery   Communicating hydrocephalus (HCC)   S/P VP shunt   Receptive aphasia   S/P percutaneous endoscopic gastrostomy (PEG) tube placement (HCC)   Dysphagia, pharyngoesophageal phase-tol D3 diet w/ thin liquids at dc 7/11   Ambulatory dysfunction   Persistent cognitive impairment after [facility]    Discharge Condition: Stable  Diet recommendation: Dysphagia 3 with thin liquids with supplemental bolus tube feedings TID  Filed Weights   07/01/21 0500 07/04/21 0500 07/05/21 0600  Weight: 59.9 kg 59.3 kg 60.7 kg    History of present illness:  60 year old male with past medical history significant for substance  abuse with marijuana/EtOH/crack cocaine. Seen in the ER initially on 5/11 for headache with negative CT imaging and was discharged with a diagnosis of sinus headache. He returned to the ER on 5/16 due to complaint of persistent headache not relieved by medication prescribed at 5/11 visit.  CT head at visit on 5/16 also unremarkable and he did not demonstrate any focal neurological signs at that visit as well.     He subsequently presented to [facility] on 5/25 after being found down by family and police after a welfare check given patient did not show up for work.  Brought in by EMS with patient being very lethargic.  CT head without contrast notable for moderate intraventricular hemorrhage with large SAH that was suspicious of an aneurysm.  Patient was seen emergently at bedside by neurosurgery and underwent external ventriculostomy drain at bedside by Dr. Wynetta Emery.  Patient was initially admitted to the [facility] service.  Patient underwent diagnostic cerebral angiogram by neurosurgery, Dr. Conchita Paris on 05/20/2021 with successful coil embolization of the right A1-A2 junction aneurysm.  Patient then underwent a laparoscopic assisted ventriculoperitoneal shunt placement by Dr. Conchita Paris and Dr. Janee Morn on 06/08/2021.  Patient was transferred to the hospitalist service on 06/10/2021.  Hospital Course: Subarachnoid hemorrhage 2/2 ACA aneurysm s/p coil embolization 5/26 Communicating hydrocephalus secondary to [facility] Found down at home by family and police on a welfare check given patient did not show up for work.  Work-up revealed IVH with large SAH suspicious for aneurysm.  Neurosurgery evaluated emergently at bedside s/p EVD 5/25.  Cerebral angiogram with coil embolization ACA aneurysm on 5/26.  Underwent VP shunt on 6/14.  Ambulatory referral sent to neurosurgery/Dr. Val Riles office-they should be contacting facility regarding follow-up appointment. Continues to require 2+ assistance for mobility.   Nontraumatic brain  injury related cognitive impairment with receptive aphasia/persistent cognitive impairment SLP documents poor cognitive functioning likely impacted by receptive aphasia. OT documents poor cognitive functioning with inability to perform functional ADL.  Continue low-dose Seroquel.   Dysphagia PEG tube for continuous feeds-placed 6/21 in IR.  SLP has advanced to dysphagia 3 with thin liquid.  Patient with variable oral intake so continue bolus tube feedings TID.     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  Continue  NaCl tablets 1 g 3 times daily.  Consider gradual weaning of sodium replacement.  Sodium was 136 on 7/9.  Recommend repeat electrolyte panel 1 week after admission to facility    Acute hypokalemia Resolved.   Physical deconditioning/ambulatory dysfunction in the setting of SAH: Therapies recommend SNF for placement. Given severity of post stroke symptoms it is unlikely he will be able to immediately return to work and anticipate at least out of work x1 year, possibly permanently depending on recovery. Please be aware that November 2021 patient underwent manipulation of right shoulder under anesthesia for adhesive capsulitis   History of polysubstance abuse with EtOH/marijuana/distant history of crack cocaine No withdrawal symptoms documented during hospitalization    Procedures: Bedside EVD by Dr. Wynetta Emery on 5/25 ACA coil embolization 5/26, Dr. Conchita Paris VP shunt placement 6/14, Dr. Conchita Paris and Dr. Janee Morn PEG tube placement by IR 6/21, Dr. Grace Isaac    Consultations: PCCM Neurosurgery General surgery  Discharge Exam: Vitals:   07/05/21 0408 07/05/21 0810  BP: (!) 115/98 129/89  Pulse: 90 92  Resp: 18 18  Temp: 97.7 F (36.5 C) 98.2 F (36.8 C)  SpO2: 100% 100%   Constitutional: Awakens easily.  Calm affect.  Note that patient fell asleep while eating breakfast. Respiratory: Anterior lung sounds remain clear, stable on room air with normal  pulse oximetry readings and no increased work of breathing Cardiovascular: S1-S2, no peripheral edema, normotensive, regular pulse Abdomen: LBM 7/10, abdomen soft nontender nondistended with normoactive bowel sounds.  Oral intake marginal and mostly poor Neurologic: CN 2-12 grossly intact. Sensation intact, spontaneously moving all extremities.  Strength appears to be 4/5 in all extremities. Psychiatric: Awakens.  Oriented to name only.  Not to year place or situation.    Discharge Instructions   Discharge Instructions     Ambulatory referral to Neurosurgery   Complete by: As directed    Patient had VP shunt placed during most recent hospitalization and likely needs at least 1 outpatient follow-up visit after discharge.   Change dressing (specify)   Complete by: As directed    Routine PEG care.  Change dressing daily and as needed   Diet general   Complete by: As directed    Dysphagia 3 with thin liquids.  Administer supplemental bolus tube feedings TID along with free water as listed on the MAR.   Increase activity slowly   Complete by: As directed    No wound care   Complete by: As directed       Allergies as of 07/05/2021   No Known Allergies      Medication List     STOP taking these medications    [employer] Allergy Relief-D 5-120 MG tablet Generic drug: cetirizine-pseudoephedrine   doxycycline 100 MG capsule Commonly known as: VIBRAMYCIN   etodolac 500 MG tablet Commonly known as: LODINE   predniSONE 20 MG tablet Commonly  known as: DELTASONE   triamcinolone cream 0.5 % Commonly known as: KENALOG       TAKE these medications    acetaminophen 325 MG tablet Commonly known as: TYLENOL Take 2 tablets (650 mg total) by mouth every 4 (four) hours as needed for mild pain (or temp > 37.5 C (99.5 F)).   feeding supplement (PROSource TF) liquid Place 45 mLs into feeding tube 2 (two) times daily.   feeding supplement (OSMOLITE 1.5 CAL) Liqd Place 395 mLs into  feeding tube 3 (three) times daily.   fiber Pack packet Place 1 packet into feeding tube 2 (two) times daily.   free water Soln Place 100 mLs into feeding tube every 4 (four) hours.   nystatin powder Commonly known as: MYCOSTATIN/NYSTOP Apply 1 application topically 3 (three) times daily.   polyethylene glycol 17 g packet Commonly known as: MIRALAX / GLYCOLAX Place 17 g into feeding tube daily. Start taking on: July 06, 2021   QUEtiapine 25 MG tablet Commonly known as: SEROQUEL Take 0.5 tablets (12.5 mg total) by mouth 2 (two) times daily.   senna-docusate 8.6-50 MG tablet Commonly known as: Senokot-S Place 2 tablets into feeding tube 2 (two) times daily.   sodium chloride 1 g tablet Place 1 tablet (1 g total) into feeding tube 2 (two) times daily with a meal.               Discharge Care Instructions  (From admission, onward)           Start     Ordered   07/05/21 0000  Change dressing (specify)       Comments: Routine PEG care.  Change dressing daily and as needed   07/05/21 1057           No Known Allergies  Contact information for follow-up providers     [facility]   Specialty: General Surgery Why: as needed for any problems with your abdominal incisions or concerns after your laparoscopic surgery Contact information: [address] [place] [id number] [phone]             Contact information for after-discharge care     Destination     [facility] .   Service: Skilled Nursing Contact information: [address] [place] [id number] [phone]                      The results of significant diagnostics from this hospitalization (including imaging, microbiology, ancillary and laboratory) are listed below for reference.    Significant Diagnostic Studies: CT ABDOMEN WO CONTRAST  Result Date: 06/14/2021 CLINICAL  DATA:  Evaluate anatomy for percutaneous gastrostomy tube placement. History of intracranial aneurysm and hemorrhage. EXAM: CT ABDOMEN WITHOUT CONTRAST TECHNIQUE: Multidetector CT imaging of the abdomen was performed following the standard protocol without IV contrast. COMPARISON:  None. FINDINGS: Lower chest: Few dependent densities at the right lung base. Otherwise, the lung bases are clear. There appears to be a small hiatal hernia. Hepatobiliary: Normal appearance of the liver and gallbladder. No significant biliary dilatation. Pancreas: Unremarkable. No pancreatic ductal dilatation or surrounding inflammatory changes. Spleen: Normal in size without focal abnormality. Adrenals/Urinary Tract: Normal appearance of the adrenal glands. Normal appearance of both kidneys without stones or hydronephrosis. Stomach/Bowel: Nasogastric tube extends in the stomach and terminates near the duodenal bulb. The stomach is cephalad to the transverse colon. No significant bowel dilatation. No focal  bowel inflammation. Vascular/Lymphatic: Atherosclerotic calcifications in the abdominal aorta without aneurysm. No significant lymph node enlargement in the abdomen. Other: VP shunt enters through the anterior right upper abdomen. There is a small amount of gas around the shunt tubing as it enters the abdominal cavity. There is also free air within the anterior upper abdomen likely secondary to the shunt placement. The shunt tubing coils around the right side of the abdomen and terminates in the mid upper abdomen adjacent to the mid transverse colon. No significant free fluid in the abdomen. Areas of subcutaneous edema in the anterior abdomen could be related to injection sites. Musculoskeletal: Sclerosis involving the anterior and mid L2 vertebral body is likely related to degenerative changes. No suspicious bone findings. IMPRESSION: 1. Stomach is located in the anterior upper abdomen and anatomy should be amendable for percutaneous  gastrostomy tube placement. 2. Pneumoperitoneum which is likely secondary to recent placement of VP shunt. 3. Probable small hiatal hernia. Electronically Signed   By: Richarda Overlie M.D.   On: 06/14/2021 08:09   IR GASTROSTOMY TUBE MOD SED  Result Date: 06/15/2021 INDICATION: Dysphagia second to subarachnoid hemorrhage post coiling of A-comm aneurysm and VP shunt placement. Please perform percutaneous gastrostomy tube placement for enteric nutrition supplementation purposes. EXAM: PULL TROUGH GASTROSTOMY TUBE PLACEMENT COMPARISON:  None. MEDICATIONS: Ancef 2 gm IV; Antibiotics were administered within 1 hour of the procedure. Glucagon 1 mg IV CONTRAST:  15 cc of Omnipaque 300 administered into the gastric lumen. ANESTHESIA/SEDATION: Moderate (conscious) sedation was employed during this procedure. A total of Versed 1 mg and Fentanyl 50 mcg was administered intravenously. Moderate Sedation Time: 33 minutes. The patient's level of consciousness and vital signs were monitored continuously by radiology nursing throughout the procedure under my direct supervision. FLUOROSCOPY TIME:  6 minutes, 48 seconds (46 mGy) COMPLICATIONS: None immediate. PROCEDURE: Informed written consent was obtained from the patient's family following explanation of the procedure, risks, benefits and alternatives. A time out was performed prior to the initiation of the procedure. Ultrasound scanning was performed to demarcate the edge of the left lobe of the liver. Maximal barrier sterile technique utilized including caps, mask, sterile gowns, sterile gloves, large sterile drape, hand hygiene and Betadine prep. The left upper quadrant was sterilely prepped and draped. An oral gastric catheter was inserted into the stomach under fluoroscopy. The existing nasogastric feeding tube was removed. The left costal margin and air opacified transverse colon were identified and avoided. Air was injected into the stomach for insufflation and visualization  under fluoroscopy. Under sterile conditions a 17 gauge trocar needle was utilized to access the stomach percutaneously beneath the left subcostal margin after the overlying soft tissues were anesthetized with 1% Lidocaine with epinephrine. Special attention was made to avoid the tip of the ventriculoperitoneal catheter tubing. Needle position was confirmed within the stomach with aspiration of air and injection of small amount of contrast. A single T tack was deployed for gastropexy. Over an Amplatz guide wire, a 9-French sheath was inserted into the stomach. A snare device was utilized to capture the oral gastric catheter. The snare device was pulled retrograde from the stomach up the esophagus and out the oropharynx. The 20-French pull-through gastrostomy was connected to the snare device and pulled antegrade through the oropharynx down the esophagus into the stomach and then through the percutaneous tract external to the patient. The gastrostomy was assembled externally. Contrast injection confirms appropriate positioning within the stomach. Several spot radiographic images were obtained in various obliquities for  documentation. Dressings were applied. The patient tolerated procedure well without immediate post procedural complication. FINDINGS: After successful fluoroscopic guided placement, the gastrostomy tube is appropriately positioned with internal disc positioned against the inner ventral wall of the gastric lumen. IMPRESSION: Successful fluoroscopic insertion of a 20-French pull-through gastrostomy tube. The gastrostomy may be used immediately for medication administration and in 24 hrs for the initiation of feeds. Electronically Signed   By: Simonne Come M.D.   On: 06/15/2021 16:38    Microbiology: No results found for this or any previous visit (from the past 240 hour(s)).   Labs: Basic Metabolic Panel: Recent Labs  Lab 06/29/21 0302 07/03/21 0616  NA 138 136  K 4.2 3.8  CL 103 101  CO2 30  26  GLUCOSE 82 103*  BUN 19 12  CREATININE 0.52* 0.52*  CALCIUM 9.6 9.5   Liver Function Tests: Recent Labs  Lab 06/29/21 0302  AST 38  ALT 72*  ALKPHOS 59  BILITOT 0.6  PROT 6.6  ALBUMIN 3.4*   No results for input(s): LIPASE, AMYLASE in the last 168 hours. No results for input(s): AMMONIA in the last 168 hours. CBC: Recent Labs  Lab 06/29/21 0302  WBC 7.8  HGB 13.8  HCT 40.0  MCV 97.1  PLT 394   Cardiac Enzymes: No results for input(s): CKTOTAL, CKMB, CKMBINDEX, TROPONINI in the last 168 hours. BNP: BNP (last 3 results) No results for input(s): BNP in the last 8760 hours.  ProBNP (last 3 results) No results for input(s): PROBNP in the last 8760 hours.  CBG: Recent Labs  Lab 07/04/21 1626 07/04/21 2001 07/04/21 2339 07/05/21 0316 07/05/21 0807  GLUCAP 107* 147* 162* 106* 108*       Signed:  Junious Silk ANP [facility] 07/05/2021, 10:58 AM

## 2021-07-05 NOTE — Progress Notes (Signed)
Pt alert and verbally responsive. Pt continues to wait on PTAR to come transport off to disposition. Reported off to oncoming RN. Dionne Bucy RN

## 2021-07-05 NOTE — TOC Transition Note (Signed)
Transition of Care [facility]) - CM/SW Discharge Note   Patient Details  Name: DEVYN SHEERIN MRN: [id number] Date of Birth: 1961/03/19  Transition of Care [facility]) CM/SW Contact:  Janae Bridgeman, RN Phone Number: 07/05/2021, 1:28 PM   Clinical Narrative:    Case management spoke with Willodean Rosenthal, MSW and arrangements are being made to transfer the patient to [facility] today via PTAR and the patient's family is aware of the transfer to the facility.  CM and MSW with DTP Team will continue to follow the patient for discharge to the facility today.     Barriers to Discharge: Continued Medical Work up   Patient Goals and CMS Choice Patient states their goals for this hospitalization and ongoing recovery are:: Rehab CMS Medicare.gov Compare Post Acute Care list provided to:: Patient Represenative (must comment) Choice offered to / list presented to : Parent  Discharge Placement                       Discharge Plan and Services In-house Referral: Clinical Social Work   Post Acute Care Choice: IP Rehab, Skilled Nursing Facility                               Social Determinants of Health (SDOH) Interventions     Readmission Risk Interventions No flowsheet data found.

## 2021-07-05 NOTE — Progress Notes (Signed)
Nutrition Follow-up  DOCUMENTATION CODES:   Not applicable  INTERVENTION:  Continue TF via PEG: -Bolus Osmolite 1.5 TID (total of 5 cartons per day) -83ml Prosource TF (or equivalent) BID -Flush with 26ml free water before and after each bolus   TF regimen provides 1855 kcals, 96g protein, free water ( total free water with boluses)  -Continue nutrisource fiber BID -Continue Boost Breeze TID  NUTRITION DIAGNOSIS:   Inadequate oral intake related to inability to eat as evidenced by NPO status.  Progressing, diet advanced to dysphagia 3 with thin liquids  GOAL:   Patient will meet greater than or equal to 90% of their needs  Addressing with TF and supplements  MONITOR:   Diet advancement, Labs, Weight trends, TF tolerance, Skin, I & O's  REASON FOR ASSESSMENT:   Other (Comment)    ASSESSMENT:   Pt is a 60 you w/ PMH sinusitis, rotator cuff tear, tobacco, marijuana, and ETOH use, remote hx of crack cocaine use who is being managed for intraventricular hemorrhage w/ large acute SAH now s/p ventriculostomy.  5/27 cortrak placed 6/14 VP shunt 6/21 s/p PEG placement 6/22 diet advanced to dysphagia 1 with thin liquids 6/28 diet advanced to dysphagia 3 with thin liquids  Discussed pt with RN. Pt is supposed to discharge today; discharge summary and orders are placed. Pt is going to [facility], [place]. Given pt's continued poor po intake (10-50% x 5 recorded meals since last RD assessment, 21% average intake), recommend continue to provide bolus TF via PEG as described above.   UOP: documented x24 hours  Admission weight: 63.5 kg Current weight: 60.7 kg  Medications:  chlorhexidine  15 mL Mouth Rinse BID   Chlorhexidine Gluconate Cloth  6 each Topical Daily   feeding supplement  1 Container Oral TID BM   feeding supplement (OSMOLITE 1.5 CAL)  395 mL Per Tube TID   feeding supplement (PROSource TF)  45 mL Per Tube BID   fiber  1 packet Per  Tube BID   free water  100 mL Per Tube Q4H   heparin injection (subcutaneous)  5,000 Units Subcutaneous Q8H   mouth rinse  15 mL Mouth Rinse q12n4p   nystatin  1 application Topical TID   polyethylene glycol  17 g Per Tube Daily   QUEtiapine  12.5 mg Oral BID   senna-docusate  2 tablet Per Tube BID   sodium chloride  1 g Per Tube BID WC   Labs: Recent Labs  Lab 06/29/21 0302 07/03/21 0616  NA 138 136  K 4.2 3.8  CL 103 101  CO2 30 26  BUN 19 12  CREATININE 0.52* 0.52*  CALCIUM 9.6 9.5  GLUCOSE 82 103*  CBGs 106-108-121   Diet Order:   Diet Order             Diet general           DIET DYS 3 Room service appropriate? Yes with Assist; Fluid consistency: Thin  Diet effective now                   EDUCATION NEEDS:   Education needs have been addressed  Skin:  Skin Assessment: Skin Integrity Issues: Skin Integrity Issues:: Other (Comment), Incisions Incisions: head, abdomen Other: MASD scrotum  Last BM:  7/10 type 5  Height:   Ht Readings from Last 1 Encounters:  05/20/21 5\' 9"  (1.753 m)    Weight:   Wt Readings from Last 1 Encounters:  07/05/21 60.7 kg    Ideal Body Weight:  72.7 kg  BMI:  Body mass index is 19.76 kg/m.  Estimated Nutritional Needs:   Kcal:  1800-2100 kcals  Protein:  90-110 g  Fluid:  >/= 1.8 L    Eugene Gavia, MS, RD, LDN (she/her/hers) RD pager number and weekend/on-call pager number located in Amion.

## 2021-07-05 NOTE — Progress Notes (Signed)
Pt discharge to [facility] and report called off to nurse Morrie Sheldon at the facility. Pt awaiting on PTAR to be transported to disposition. Pt didn't have any IV or telemetry or lines to be removed. Will continue to closely monitor pt till pick up by PTAR. Dionne Bucy RN

## 2021-07-05 NOTE — Progress Notes (Addendum)
11:35am: The number to call for report is [phone].   9:40am: CSW received confirmation that patient's insurance authorization was approved.   CSW notified Revonda Standard, NP of information.  CSW spoke with patient's mother [name] to inform her of discharge information - she is agreeable to plan.  CSW scheduled PTAR for pick up at 1pm.  8:30am: CSW attempted to reach [place] at [place] to determine the status of the patient's BCBS authorization - the authorization was submitted, but additional clinicals have been requested. CSW sent clinicals to [facility] for submission.  Edwin Dada, MSW, LCSW Transitions of Care  Clinical Social Worker II [phone]

## 2021-11-30 ENCOUNTER — Other Ambulatory Visit (HOSPITAL_COMMUNITY): Payer: Self-pay | Admitting: Internal Medicine

## 2021-11-30 ENCOUNTER — Telehealth (HOSPITAL_COMMUNITY): Payer: Self-pay

## 2021-11-30 DIAGNOSIS — Z431 Encounter for attention to gastrostomy: Secondary | ICD-10-CM

## 2021-11-30 NOTE — Telephone Encounter (Signed)
Returned call to [place] at [facility] to schedule peg removal, no answer, left vm. AW

## 2021-12-03 ENCOUNTER — Ambulatory Visit (HOSPITAL_COMMUNITY)
Admission: RE | Admit: 2021-12-03 | Discharge: 2021-12-03 | Disposition: A | Discharge status: Home / Self Care | Payer: Medicaid Other | Source: Ambulatory Visit | Attending: Internal Medicine | Admitting: Internal Medicine

## 2021-12-03 ENCOUNTER — Other Ambulatory Visit: Payer: Self-pay

## 2021-12-03 DIAGNOSIS — Z431 Encounter for attention to gastrostomy: Secondary | ICD-10-CM | POA: Insufficient documentation

## 2021-12-03 HISTORY — PX: IR GASTROSTOMY TUBE REMOVAL: IMG5492

## 2021-12-03 MED ORDER — LIDOCAINE VISCOUS HCL 2 % MT SOLN
OROMUCOSAL | Status: AC
  Filled 2021-12-03: qty 15

## 2021-12-03 NOTE — Procedures (Signed)
Gastrostomy tube removed per orders. Please see full dictation under Imaging tab in Epic.  Alwyn Ren, [place] [phone] 12/03/2021, 2:40 PM

## 2021-12-29 IMAGING — CT CT HEAD W/O CM
3 of 4 series · 13 of 47 positions shown, 15 images · non-contrast
Comparison: CT May 23, 2020.
COMPARISON: CT May 23, 2020.

Addendum:
CLINICAL DATA: Hydrocephalus.

EXAM:
CT HEAD WITHOUT CONTRAST
TECHNIQUE: Contiguous axial images were obtained from the base of the skull
through the vertex without intravenous contrast.

[Series 3: head without · axial · non-contrast · 0.44mm/px · z∈[-42,+78]mm · 7 of 32 slices shown, 9 images]
[im 4/32  brain]
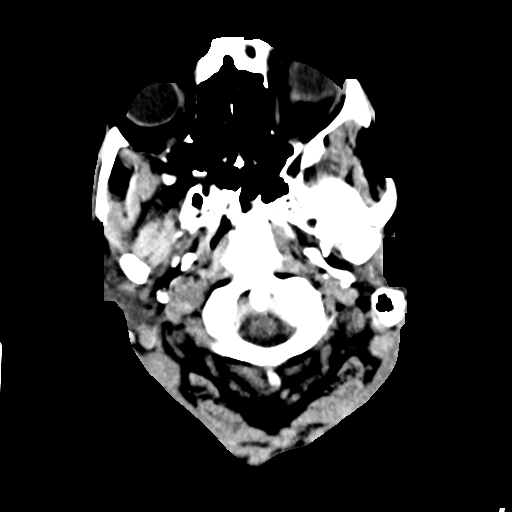
[im 4/32  bone]
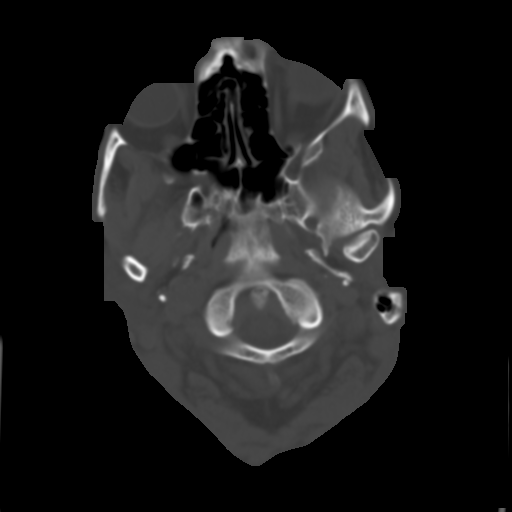
[im 8/32  brain]
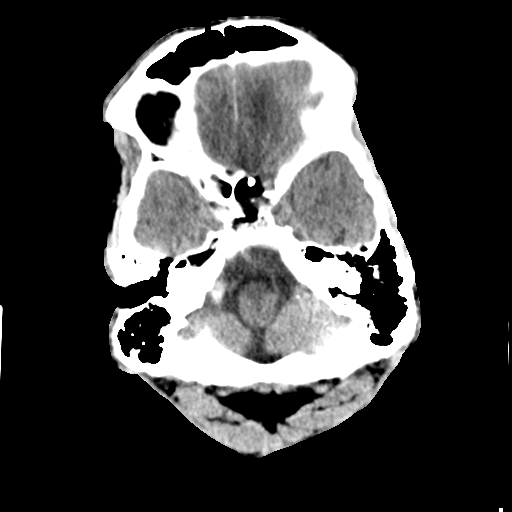
[im 12/32  brain]
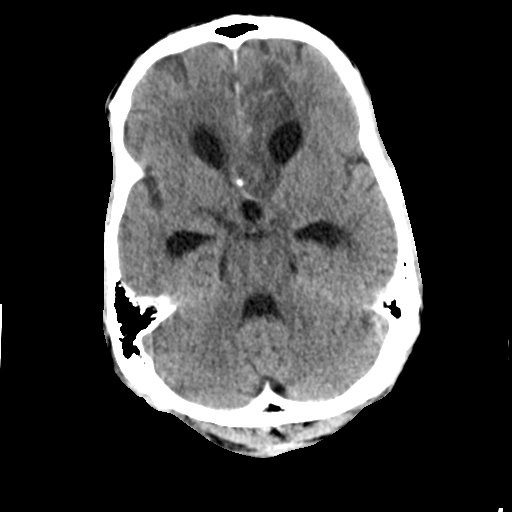
[im 16/32  brain]
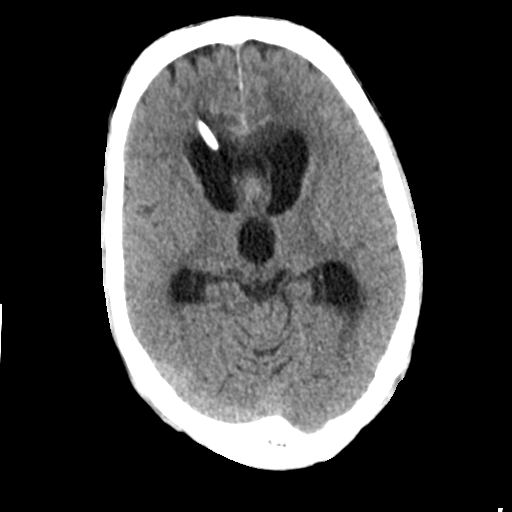
[im 20/32  brain]
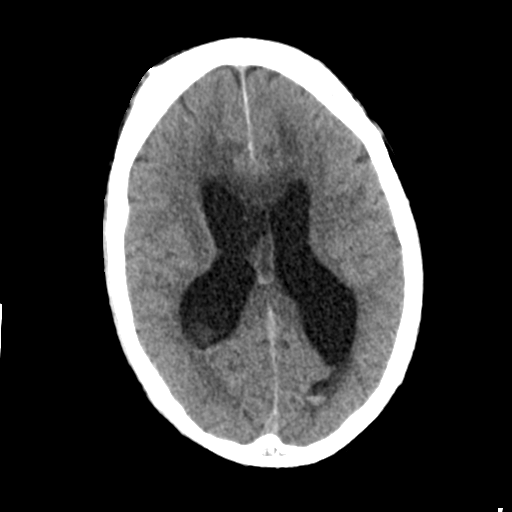
[im 20/32  bone]
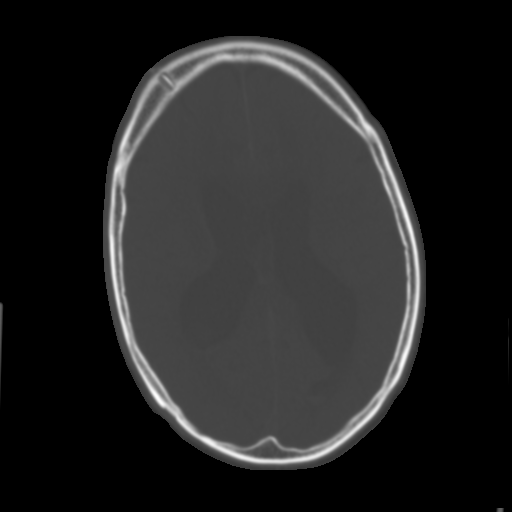
[im 24/32  brain]
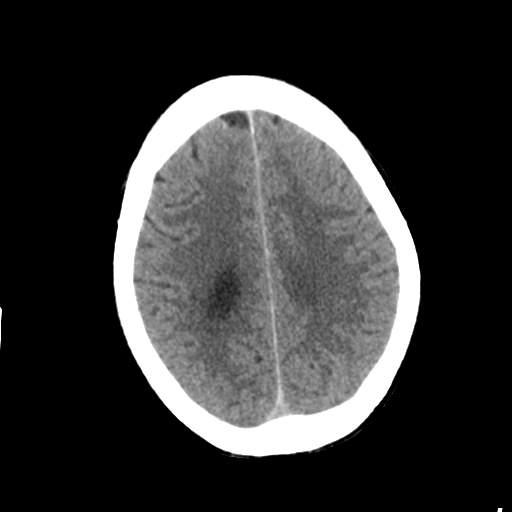
[im 28/32  brain]
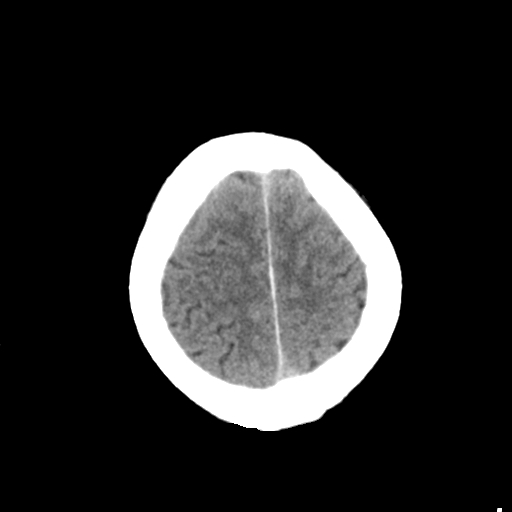

[Series 5: head without cor · coronal · non-contrast · 0.37mm/px · 3 of 68 slices shown]
[im 23/68  brain]
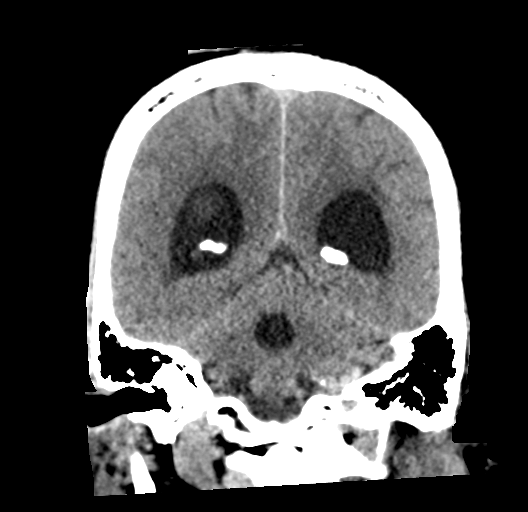
[im 30/68  brain]
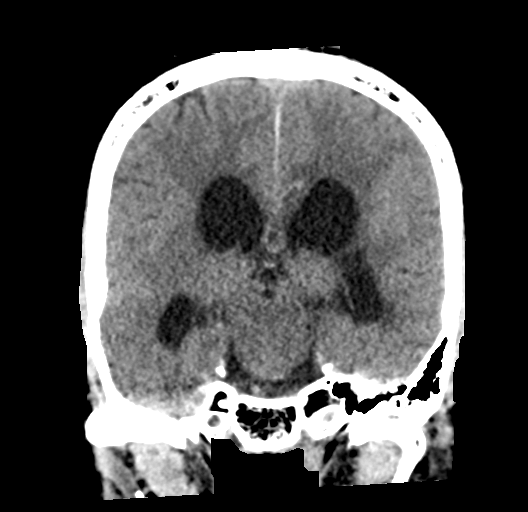
[im 38/68  brain]
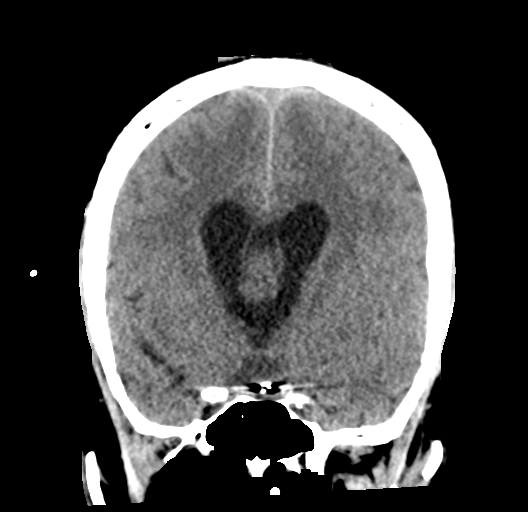

[Series 6: head without sag · sagittal · non-contrast · 0.31mm/px · 3 of 67 slices shown]
[im 23/67  brain]
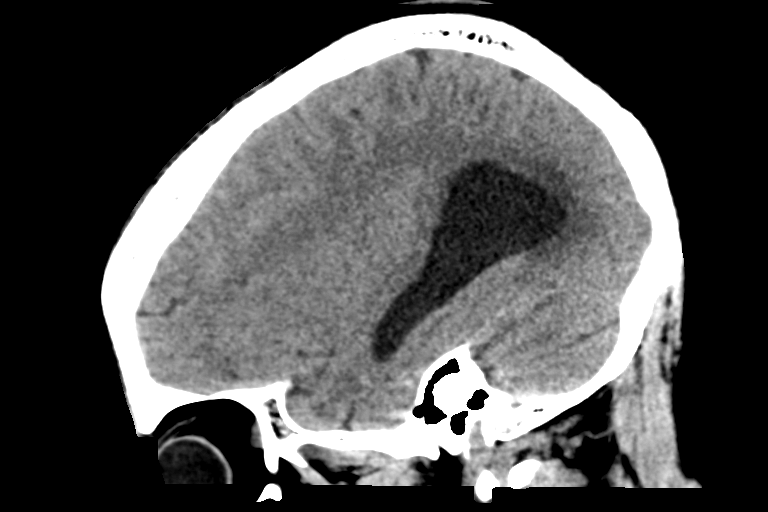
[im 34/67  brain]
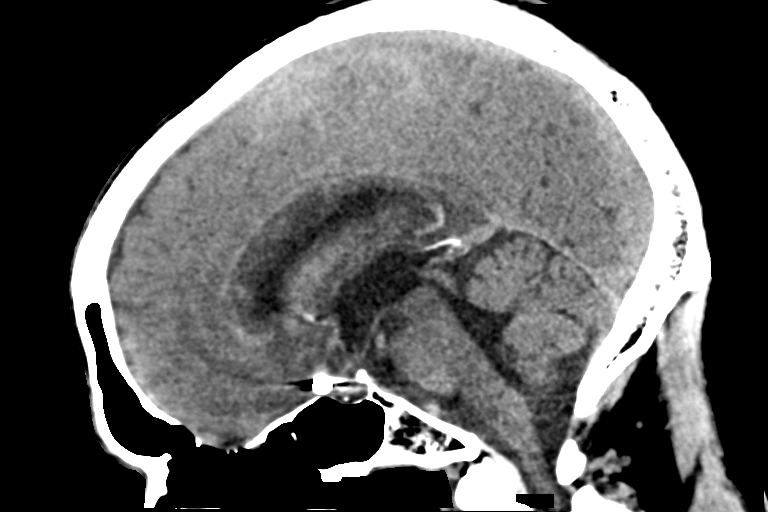
[im 45/67  brain]
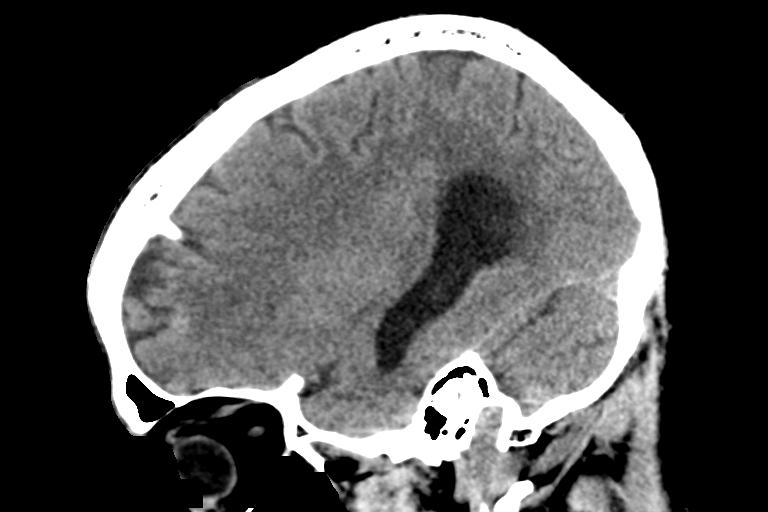

[13 of 47 positions shown; findings below may reference images not displayed]

FINDINGS: Brain: Significantly increased ventriculomegaly, compatible with
worsening hydrocephalus. Right frontal approach ventriculostomy
catheter with the tip at the right lateral ventricle near the
foramen of Angela Maria, similar to prior. Visualized portions of the shunt
catheter appear intact.

Small (7 mm) focus of hyperdense hemorrhage in the left pericallosal
frontal lobe (series 3, image 19 and series 6, image 38) is
concerning for a new/interval acute hemorrhage given no definite
hyperdense hemorrhage at this site on the prior, although a direct
comparison is limited given evolving blood products.

Otherwise, decreased size and density of intraparenchymal hemorrhage
in the inferior left frontal lobe and corpus callosum. Hypodensity
in these areas likely represents edema and developing
encephalomalacia. Small volume of surrounding subarachnoid
hemorrhage, decreased. No evidence of interval acute large vascular
territory infarct.

Decreased layering intraventricular hemorrhage. Significantly
increased size of the lateral and third ventricles, compatible with
worsening hydrocephalus. There is rounding of the temporal horns and
outward convexity of the third ventricle.

Vascular: No hyperdense vessel identified. Sequela of left anterior
communicating artery aneurysm coiling. Surrounding streak artifact
limits evaluation in this region.

Skull: Right frontal burr hole.  No acute fracture.

Sinuses/Orbits: Visualized sinuses are clear.

Other: No mastoid effusions.
IMPRESSION: 1. Significantly increased ventriculomegaly, compatible with
worsening hydrocephalus.
2. Small (7 mm) focus of hyperdense hemorrhage in the left
pericallosal frontal lobe is concerning for a new/interval site of
acute hemorrhage given no definite hyperdense hemorrhage at this
site on the prior, although a direct comparison is limited given
evolving blood products. Recommend attention on close interval
follow-up.
3. Otherwise, improving intraparenchymal, subarachnoid, and
intraventricular hemorrhage with edema and suspected developing
encephalomalacia in the inferior left frontal lobe and corpus
callosum.

ADDENDUM:
Findings and recommendations discussed with Dr. Ceejay via telephone
at [DATE] a.m.

*** End of Addendum ***
FINDINGS: Brain: Significantly increased ventriculomegaly, compatible with
worsening hydrocephalus. Right frontal approach ventriculostomy
catheter with the tip at the right lateral ventricle near the
foramen of Angela Maria, similar to prior. Visualized portions of the shunt
catheter appear intact.

Small (7 mm) focus of hyperdense hemorrhage in the left pericallosal
frontal lobe (series 3, image 19 and series 6, image 38) is
concerning for a new/interval acute hemorrhage given no definite
hyperdense hemorrhage at this site on the prior, although a direct
comparison is limited given evolving blood products.

Otherwise, decreased size and density of intraparenchymal hemorrhage
in the inferior left frontal lobe and corpus callosum. Hypodensity
in these areas likely represents edema and developing
encephalomalacia. Small volume of surrounding subarachnoid
hemorrhage, decreased. No evidence of interval acute large vascular
territory infarct.

Decreased layering intraventricular hemorrhage. Significantly
increased size of the lateral and third ventricles, compatible with
worsening hydrocephalus. There is rounding of the temporal horns and
outward convexity of the third ventricle.

Vascular: No hyperdense vessel identified. Sequela of left anterior
communicating artery aneurysm coiling. Surrounding streak artifact
limits evaluation in this region.

Skull: Right frontal burr hole.  No acute fracture.

Sinuses/Orbits: Visualized sinuses are clear.

Other: No mastoid effusions.
IMPRESSION: 1. Significantly increased ventriculomegaly, compatible with
worsening hydrocephalus.
2. Small (7 mm) focus of hyperdense hemorrhage in the left
pericallosal frontal lobe is concerning for a new/interval site of
acute hemorrhage given no definite hyperdense hemorrhage at this
site on the prior, although a direct comparison is limited given
evolving blood products. Recommend attention on close interval
follow-up.
3. Otherwise, improving intraparenchymal, subarachnoid, and
intraventricular hemorrhage with edema and suspected developing
encephalomalacia in the inferior left frontal lobe and corpus
callosum.

## 2022-01-26 ENCOUNTER — Other Ambulatory Visit: Payer: Self-pay | Admitting: Neurosurgery

## 2022-01-26 DIAGNOSIS — I609 Nontraumatic subarachnoid hemorrhage, unspecified: Secondary | ICD-10-CM

## 2022-02-08 ENCOUNTER — Other Ambulatory Visit: Payer: Self-pay

## 2022-02-08 ENCOUNTER — Other Ambulatory Visit: Payer: Self-pay | Admitting: Neurosurgery

## 2022-02-08 ENCOUNTER — Ambulatory Visit (HOSPITAL_COMMUNITY)
Admission: RE | Admit: 2022-02-08 | Discharge: 2022-02-08 | Disposition: A | Discharge status: Home / Self Care | Payer: Medicaid Other | Source: Ambulatory Visit | Attending: Neurosurgery | Admitting: Neurosurgery

## 2022-02-08 VITALS — BP 131/88 | HR 80 | Temp 98.0°F | Resp 16 | Ht 64.0 in | Wt 140.0 lb

## 2022-02-08 DIAGNOSIS — R4189 Other symptoms and signs involving cognitive functions and awareness: Secondary | ICD-10-CM

## 2022-02-08 DIAGNOSIS — F17210 Nicotine dependence, cigarettes, uncomplicated: Secondary | ICD-10-CM | POA: Insufficient documentation

## 2022-02-08 DIAGNOSIS — I609 Nontraumatic subarachnoid hemorrhage, unspecified: Secondary | ICD-10-CM

## 2022-02-08 DIAGNOSIS — I671 Cerebral aneurysm, nonruptured: Secondary | ICD-10-CM | POA: Insufficient documentation

## 2022-02-08 HISTORY — PX: IR ANGIO INTRA EXTRACRAN SEL INTERNAL CAROTID UNI R MOD SED: IMG5362

## 2022-02-08 HISTORY — PX: IR US GUIDE VASC ACCESS RIGHT: IMG2390

## 2022-02-08 LAB — CBC WITH DIFFERENTIAL/PLATELET
Abs Immature Granulocytes: 0.01 10*3/uL (ref 0.00–0.07)
Basophils Absolute: 0 10*3/uL (ref 0.0–0.1)
Basophils Relative: 1 %
Eosinophils Absolute: 0.3 10*3/uL (ref 0.0–0.5)
Eosinophils Relative: 6 %
HCT: 41.3 % (ref 39.0–52.0)
Hemoglobin: 14 g/dL (ref 13.0–17.0)
Immature Granulocytes: 0 %
Lymphocytes Relative: 38 %
Lymphs Abs: 1.8 10*3/uL (ref 0.7–4.0)
MCH: 31.3 pg (ref 26.0–34.0)
MCHC: 33.9 g/dL (ref 30.0–36.0)
MCV: 92.4 fL (ref 80.0–100.0)
Monocytes Absolute: 0.4 10*3/uL (ref 0.1–1.0)
Monocytes Relative: 9 %
Neutro Abs: 2.3 10*3/uL (ref 1.7–7.7)
Neutrophils Relative %: 46 %
Platelets: UNDETERMINED 10*3/uL (ref 150–400)
RBC: 4.47 MIL/uL (ref 4.22–5.81)
RDW: 14.1 % (ref 11.5–15.5)
WBC: 4.9 10*3/uL (ref 4.0–10.5)
nRBC: 0 % (ref 0.0–0.2)

## 2022-02-08 LAB — BASIC METABOLIC PANEL
Anion gap: 8 (ref 5–15)
BUN: 10 mg/dL (ref 6–20)
CO2: 23 mmol/L (ref 22–32)
Calcium: 9.6 mg/dL (ref 8.9–10.3)
Chloride: 108 mmol/L (ref 98–111)
Creatinine, Ser: 0.73 mg/dL (ref 0.61–1.24)
GFR, Estimated: >60 mL/min (ref >60)
Glucose, Bld: 98 mg/dL (ref 70–99)
Potassium: 4 mmol/L (ref 3.5–5.1)
Sodium: 139 mmol/L (ref 135–145)

## 2022-02-08 LAB — APTT: aPTT: 29 seconds (ref 24–36)

## 2022-02-08 LAB — PROTIME-INR
INR: 0.9 (ref 0.8–1.2)
Prothrombin Time: 12.6 seconds (ref 11.4–15.2)

## 2022-02-08 MED ORDER — LIDOCAINE HCL 1 % IJ SOLN
INTRAMUSCULAR | Status: AC
  Administered 2022-02-08: 10 mL
  Filled 2022-02-08: qty 20

## 2022-02-08 MED ORDER — MIDAZOLAM HCL 2 MG/2ML IJ SOLN
INTRAMUSCULAR | Status: AC | PRN
  Administered 2022-02-08: 1 mg via INTRAVENOUS

## 2022-02-08 MED ORDER — HEPARIN SODIUM (PORCINE) 1000 UNIT/ML IJ SOLN
INTRAMUSCULAR | Status: AC | PRN
  Administered 2022-02-08: 2000 [IU] via INTRAVENOUS

## 2022-02-08 MED ORDER — FENTANYL CITRATE (PF) 100 MCG/2ML IJ SOLN
INTRAMUSCULAR | Status: AC | PRN
  Administered 2022-02-08: 25 ug via INTRAVENOUS

## 2022-02-08 MED ORDER — IOHEXOL 300 MG/ML  SOLN
100.0000 mL | Freq: Once | INTRAMUSCULAR | Status: AC | PRN
  Administered 2022-02-08: 32 mL via INTRAVENOUS

## 2022-02-08 MED ORDER — FENTANYL CITRATE (PF) 100 MCG/2ML IJ SOLN
INTRAMUSCULAR | Status: AC
  Filled 2022-02-08: qty 2

## 2022-02-08 MED ORDER — HEPARIN SODIUM (PORCINE) 1000 UNIT/ML IJ SOLN
INTRAMUSCULAR | Status: AC
  Filled 2022-02-08: qty 10

## 2022-02-08 MED ORDER — CHLORHEXIDINE GLUCONATE CLOTH 2 % EX PADS: 6.0000 | MEDICATED_PAD | Freq: Once | CUTANEOUS | Status: DC

## 2022-02-08 MED ORDER — SODIUM CHLORIDE 0.9 % IV SOLN: INTRAVENOUS | Status: DC

## 2022-02-08 MED ORDER — MIDAZOLAM HCL 2 MG/2ML IJ SOLN
INTRAMUSCULAR | Status: AC
  Filled 2022-02-08: qty 2

## 2022-02-08 MED ORDER — CEFAZOLIN SODIUM-DEXTROSE 2-4 GM/100ML-% IV SOLN: 2.0000 g | INTRAVENOUS | Status: DC

## 2022-02-08 MED ORDER — HYDROCODONE-ACETAMINOPHEN 5-325 MG PO TABS: 1.0000 | ORAL_TABLET | ORAL | Status: DC | PRN

## 2022-02-08 NOTE — Brief Op Note (Signed)
°  NEUROSURGERY BRIEF OPERATIVE  NOTE   PREOP DX: Cerebral aneurysm  POSTOP DX: Same  PROCEDURE: Diagnostic cerebral angiogram  SURGEON: Dr. Lisbeth Renshaw, MD  ANESTHESIA: IV Sedation with Local  EBL: Minimal  SPECIMENS: None  COMPLICATIONS: None  CONDITION: Stable to recovery  FINDINGS (Full report in [facility]): 1. Persistent occlusion of previously coiled right A1-2 junction aneurysm approximately 8 months after coiling for subarachnoid hemorrhage   Lisbeth Renshaw, MD [facility]

## 2022-02-08 NOTE — H&P (Signed)
Chief Complaint  Aneurysm  History of Present Illness  [name] [name] is a 61 y.o. male who is now approximately 7 months status post subarachnoid hemorrhage related to a ruptured anterior communicating artery aneurysm.  Patient underwent coil embolization and had a protracted hospital stay, ultimately discharged to rehab and subsequently home.  He has actually had an excellent recovery and comes in today for routine short-term angiographic follow-up.  Past Medical History   Past Medical History:  Diagnosis Date   Alcohol abuse    Drug abuse (HCC)    Right rotator cuff tear    Seasonal allergies     Past Surgical History   Past Surgical History:  Procedure Laterality Date   AMPUTATION Left 05/30/2019   Procedure: REVISION AMPUTATION OF L INDEX FINGER;  Surgeon: Betha Loa, MD;  Location: [facility] OR;  Service: Orthopedics;  Laterality: Left;   ANEURYSM COILING  05/19/2021   Anterior communicating aneurysm   IR ANGIO INTRA EXTRACRAN SEL INTERNAL CAROTID UNI R MOD SED  05/20/2021   IR ANGIOGRAM FOLLOW UP STUDY  05/20/2021   IR GASTROSTOMY TUBE MOD SED  06/15/2021   IR GASTROSTOMY TUBE REMOVAL  12/03/2021   IR TRANSCATH/EMBOLIZ  05/20/2021   LAPAROSCOPIC REVISION VENTRICULAR-PERITONEAL (V-P) SHUNT Right 06/08/2021   Procedure: LAPAROSCOPIC ASSISTED VENTRICULAR-PERITONEAL (V-P) SHUNT;  Surgeon: Violeta Gelinas, MD;  Location: [facility] OR;  Service: General;  Laterality: Right;   RADIOLOGY WITH ANESTHESIA N/A 05/20/2021   Procedure: IR WITH ANESTHESIA;  Surgeon: Lisbeth Renshaw, MD;  Location: [facility] OR;  Service: Radiology;  Laterality: N/A;   SHOULDER ARTHROSCOPY WITH OPEN ROTATOR CUFF REPAIR AND DISTAL CLAVICLE ACROMINECTOMY Right 07/03/2020   Procedure: SHOULDER ARTHROSCOPY WITH OPEN ROTATOR CUFF REPAIR AND DISTAL CLAVICLE ARTHROSCOPIC DEBRIDEMENT;  Surgeon: Frederico Hamman, MD;  Location: [facility];  Service: Orthopedics;  Laterality: Right;   SHOULDER CLOSED REDUCTION Right  10/28/2020   Procedure: CLOSED MANIPULATION SHOULDER;  Surgeon: Frederico Hamman, MD;  Location: [facility];  Service: Orthopedics;  Laterality: Right;   toe amputaion Left yrs ago   VENTRICULOPERITONEAL SHUNT N/A 06/08/2021   Procedure: SHUNT INSERTION VENTRICULAR-PERITONEAL;  Surgeon: Lisbeth Renshaw, MD;  Location: [facility] OR;  Service: Neurosurgery;  Laterality: N/A;    Social History   Social History   Tobacco Use   Smoking status: Every Day    Packs/day: 0.25    Years: 30.00    Pack years: 7.50    Types: Cigarettes   Smokeless tobacco: Never  Vaping Use   Vaping Use: Never used  Substance Use Topics   Alcohol use: Yes    Alcohol/week: 3.0 standard drinks    Types: 3 Cans of beer per week    Comment: daily 3-5 beers per day, sometimes none   Drug use: Yes    Types: Marijuana, "Crack" cocaine    Comment: former cocaine/crack last used yrs ago, marijuana last used 04/2021    Medications   Prior to Admission medications   Medication Sig Start Date End Date Taking? Authorizing Provider  acetaminophen (TYLENOL) 325 MG tablet Take 2 tablets (650 mg total) by mouth every 4 (four) hours as needed for mild pain (or temp > 37.5 C (99.5 F)). 07/05/21  Yes Russella Dar, NP  fiber (NUTRISOURCE FIBER) PACK packet Place 1 packet into feeding tube 2 (two) times daily. 07/05/21   Russella Dar, NP  Nutritional Supplements (FEEDING SUPPLEMENT, OSMOLITE 1.5 CAL,) LIQD Place 395 mLs into feeding tube 3 (three) times daily. 07/05/21  Samella Parr, NP  Nutritional Supplements (FEEDING SUPPLEMENT, PROSOURCE TF,) liquid Place 45 mLs into feeding tube 2 (two) times daily. 07/05/21   Samella Parr, NP  nystatin (MYCOSTATIN/NYSTOP) powder Apply 1 application topically 3 (three) times daily. 07/05/21   Samella Parr, NP  polyethylene glycol (MIRALAX / GLYCOLAX) 17 g packet Place 17 g into feeding tube daily. 07/06/21   Samella Parr, NP  QUEtiapine (SEROQUEL) 25 MG tablet  Take 0.5 tablets (12.5 mg total) by mouth 2 (two) times daily. 07/05/21   Samella Parr, NP  senna-docusate (SENOKOT-S) 8.6-50 MG tablet Place 2 tablets into feeding tube 2 (two) times daily. 07/05/21   Samella Parr, NP  sertraline (ZOLOFT) 50 MG tablet Take 50 mg by mouth daily. 01/12/22   [provider]  sodium chloride 1 g tablet Place 1 tablet (1 g total) into feeding tube 2 (two) times daily with a meal. 07/05/21   Samella Parr, NP  Vitamin D, Ergocalciferol, (DRISDOL) 1.25 MG (50000 UNIT) CAPS capsule Take 50,000 Units by mouth once a week. 01/12/22   [provider]  Water For Irrigation, Sterile (FREE WATER) SOLN Place 100 mLs into feeding tube every 4 (four) hours. 07/05/21   Samella Parr, NP    Allergies  No Known Allergies  Review of Systems  ROS  Neurologic Exam  Awake, alert, oriented Memory and concentration grossly intact Speech fluent, appropriate CN grossly intact Motor exam: Upper Extremities Deltoid Bicep Tricep Grip  Right 5/5 5/5 5/5 5/5  Left 5/5 5/5 5/5 5/5   Lower Extremities IP Quad PF DF EHL  Right 5/5 5/5 5/5 5/5 5/5  Left 5/5 5/5 5/5 5/5 5/5   Sensation grossly intact to LT  Impression  - 61 y.o. male 75-month status post subarachnoid hemorrhage and coil embolization of an anterior communicating artery aneurysm.  He is doing well neurologically.  Plan  -We will plan on proceeding with routine short-term follow-up diagnostic cerebral angiogram   I have reviewed the details of the procedure as well as the expected postoperative course and recovery with the patient in the office.  We have also discussed the associated risks, benefits, and alternatives.  All his questions were answered and he provided informed consent to proceed.  Consuella Lose, MD [facility]

## 2022-03-28 ENCOUNTER — Ambulatory Visit: Payer: Medicaid Other | Attending: Internal Medicine | Admitting: Internal Medicine

## 2022-03-28 ENCOUNTER — Encounter: Payer: Self-pay | Admitting: Internal Medicine

## 2022-03-28 ENCOUNTER — Other Ambulatory Visit: Payer: Self-pay | Admitting: Internal Medicine

## 2022-03-28 VITALS — BP 148/97 | HR 75 | Resp 16 | Ht 64.0 in | Wt 149.6 lb

## 2022-03-28 DIAGNOSIS — F1414 Cocaine abuse with cocaine-induced mood disorder: Secondary | ICD-10-CM | POA: Diagnosis not present

## 2022-03-28 DIAGNOSIS — E559 Vitamin D deficiency, unspecified: Secondary | ICD-10-CM | POA: Diagnosis not present

## 2022-03-28 DIAGNOSIS — F17210 Nicotine dependence, cigarettes, uncomplicated: Secondary | ICD-10-CM | POA: Diagnosis not present

## 2022-03-28 DIAGNOSIS — Z7901 Long term (current) use of anticoagulants: Secondary | ICD-10-CM | POA: Insufficient documentation

## 2022-03-28 DIAGNOSIS — R519 Headache, unspecified: Secondary | ICD-10-CM | POA: Diagnosis not present

## 2022-03-28 DIAGNOSIS — Z7689 Persons encountering health services in other specified circumstances: Secondary | ICD-10-CM | POA: Diagnosis not present

## 2022-03-28 DIAGNOSIS — Z1211 Encounter for screening for malignant neoplasm of colon: Secondary | ICD-10-CM | POA: Diagnosis not present

## 2022-03-28 DIAGNOSIS — Z8673 Personal history of transient ischemic attack (TIA), and cerebral infarction without residual deficits: Secondary | ICD-10-CM | POA: Insufficient documentation

## 2022-03-28 DIAGNOSIS — K5909 Other constipation: Secondary | ICD-10-CM | POA: Diagnosis not present

## 2022-03-28 DIAGNOSIS — Z79899 Other long term (current) drug therapy: Secondary | ICD-10-CM | POA: Diagnosis not present

## 2022-03-28 DIAGNOSIS — F1994 Other psychoactive substance use, unspecified with psychoactive substance-induced mood disorder: Secondary | ICD-10-CM

## 2022-03-28 DIAGNOSIS — I10 Essential (primary) hypertension: Secondary | ICD-10-CM | POA: Diagnosis not present

## 2022-03-28 DIAGNOSIS — Z0189 Encounter for other specified special examinations: Secondary | ICD-10-CM | POA: Insufficient documentation

## 2022-03-28 DIAGNOSIS — Z1159 Encounter for screening for other viral diseases: Secondary | ICD-10-CM | POA: Diagnosis not present

## 2022-03-28 DIAGNOSIS — G8929 Other chronic pain: Secondary | ICD-10-CM | POA: Diagnosis not present

## 2022-03-28 DIAGNOSIS — F17200 Nicotine dependence, unspecified, uncomplicated: Secondary | ICD-10-CM | POA: Diagnosis not present

## 2022-03-28 DIAGNOSIS — M19049 Primary osteoarthritis, unspecified hand: Secondary | ICD-10-CM

## 2022-03-28 DIAGNOSIS — M79641 Pain in right hand: Secondary | ICD-10-CM | POA: Insufficient documentation

## 2022-03-28 DIAGNOSIS — I6930 Unspecified sequelae of cerebral infarction: Secondary | ICD-10-CM

## 2022-03-28 DIAGNOSIS — Z125 Encounter for screening for malignant neoplasm of prostate: Secondary | ICD-10-CM | POA: Diagnosis not present

## 2022-03-28 DIAGNOSIS — R7303 Prediabetes: Secondary | ICD-10-CM

## 2022-03-28 DIAGNOSIS — F1911 Other psychoactive substance abuse, in remission: Secondary | ICD-10-CM

## 2022-03-28 LAB — POCT GLYCOSYLATED HEMOGLOBIN (HGB A1C): HbA1c, POC (prediabetic range): 5.5 % — AB (ref 5.7–6.4)

## 2022-03-28 MED ORDER — VITAMIN D (ERGOCALCIFEROL) 1.25 MG (50000 UNIT) PO CAPS: 50000.0000 [IU] | ORAL_CAPSULE | ORAL | 1 refills | Status: DC

## 2022-03-28 MED ORDER — DICLOFENAC SODIUM 1 % EX GEL: 2.0000 g | Freq: Four times a day (QID) | CUTANEOUS | 2 refills | Status: AC

## 2022-03-28 MED ORDER — AMLODIPINE BESYLATE 5 MG PO TABS: 5.0000 mg | ORAL_TABLET | Freq: Every day | ORAL | 1 refills | Status: DC

## 2022-03-28 MED ORDER — POLYETHYLENE GLYCOL 3350 17 G PO PACK: 17.0000 g | PACK | ORAL | 4 refills | Status: DC

## 2022-03-28 MED ORDER — SERTRALINE HCL 50 MG PO TABS: 50.0000 mg | ORAL_TABLET | Freq: Every day | ORAL | 4 refills | Status: AC

## 2022-03-28 MED ORDER — NICOTINE 14 MG/24HR TD PT24: 14.0000 mg | MEDICATED_PATCH | Freq: Every day | TRANSDERMAL | 1 refills | Status: DC

## 2022-03-28 MED ORDER — SENNOSIDES-DOCUSATE SODIUM 8.6-50 MG PO TABS: 1.0000 | ORAL_TABLET | Freq: Two times a day (BID) | ORAL | 3 refills | Status: AC | PRN

## 2022-03-28 MED ORDER — ACETAMINOPHEN 325 MG PO TABS: 650.0000 mg | ORAL_TABLET | Freq: Two times a day (BID) | ORAL | Status: AC | PRN

## 2022-03-28 MED ORDER — QUETIAPINE FUMARATE 25 MG PO TABS: 12.5000 mg | ORAL_TABLET | Freq: Every day | ORAL | 2 refills | Status: AC

## 2022-03-28 NOTE — Patient Instructions (Signed)

## 2022-03-28 NOTE — Progress Notes (Signed)
? ? ?Patient ID: [name] [name], male    DOB: 02-26-1961  MRN: [id number] ? ?CC: New Patient (Initial Visit), Headache, Hand Pain (Right), and A1C (5.5) ? ? ?Subjective: ?[name] [name] is a 61 y.o. male who presents for new patient visit.  Mother, Hedda Slade, is with him.  ?His concerns today include:  ?Patient with history of [place] 04/2021 due to ACA aneurysmal hemorrhage s/p post coil embolization, communicating hydrocephalus s/p VP shunt, prediabetes, substance-induced mood disorder, tobacco dependence, polysubstance abuse (crack cocaine, marijuana, EtOH), s/p PEG since removed ? ?Pt no no prior PCP ?He was hosp from 5-06/2021 secondary to [facility]. Transferred to  [place] post hosp and was finally discgh 12/2021.   ?PEG tube d/c as off 11/2021. Eating regular foods ?Speech back to nl. ?No assistive device ?Needs help with dressing and bathing. Has a shower chair which he uses.  Independent in toileting and feeding.  Poor memory. ?BP elev today.  No previous hx and not on BP med ?On Zoloft daily and Seroquel BID, mom thinks for depression.  The Seroquel makes him drowsy.  She sometimes hold off on giving it to him in the mornings because of this. ?C/o constant headache since [facility].  Describes as dull ache.  Frontal.  No associated blurred vision, N/V, photophobia.  Dizziness at times.  No fever.  Takes Tylenol 325 mg BID.  Tylenol knots it down some. ?Saw Dr. Kathyrn Sheriff  02/08/2022 and had dx cerebral angiogram and was told that everything looked okay from the previous coiling procedure. ?Has applied for disability and has appt for psychiatric eval 04/01/22 and for general exam 04/21/22 scheduled by the disability office.  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 ?Still smoking 3-4 cigarettes a wk. Same prior to [facility]. Smoked for 30-40 yrs.  Ready to quit.  Stayed clean of ETOH and street drugs since hospitalization ? ?Complains of stiffness and pain in the right hand. ? ?Lives with mom.  Has 2 grown  children, they are not involved in his care.  Single.   ? ?Has vitamin D deficiency for which she takes high-dose vitamin D once a week. ?Also has chronic constipation.  His mother gives him MiraLAX twice a week.  On Senokot tablets twice a day.  Reports bowel movements have been regular. ? ?HM:  had 4 COVID vaccine, flu shot ? ?Patient Active Problem List  ? Diagnosis Date Noted  ? Dysphagia, pharyngoesophageal phase-tol D3 diet w/ thin liquids at dc 7/11 07/05/2021  ? Ambulatory dysfunction 07/05/2021  ? Persistent cognitive impairment after [facility] 07/05/2021  ? Communicating hydrocephalus ([place])   ? S/P VP shunt   ? Receptive aphasia   ? S/P percutaneous endoscopic gastrostomy (PEG) tube placement ([place])   ? Hyponatremia 05/28/2021  ? Aneurysmal subarachnoid hemorrhage ([place]) 05/28/2021  ? Vasospasm of cerebral artery 05/28/2021  ? Substance induced mood disorder ([place]) 10/31/2016  ?  ? ?No current outpatient medications on file prior to visit.  ? ?No current facility-administered medications on file prior to visit.  ? ? ?No Known Allergies ? ?Social History  ? ?Socioeconomic History  ? Marital status: Single  ?  Spouse name: Not on file  ? Number of children: Not on file  ? Years of education: Not on file  ? Highest education level: Not on file  ?Occupational History  ? Not on file  ?Tobacco Use  ? Smoking status: Every Day  ?  Packs/day: 0.25  ?  Years: 30.00  ?  Pack years: 7.50  ?  Types: Cigarettes  ? Smokeless tobacco: Never  ?Vaping Use  ? Vaping Use: Never used  ?Substance and Sexual Activity  ? Alcohol use: Yes  ?  Alcohol/week: 3.0 standard drinks  ?  Types: 3 Cans of beer per week  ?  Comment: daily 3-5 beers per day, sometimes none  ? Drug use: Yes  ?  Types: Marijuana, "Crack" cocaine  ?  Comment: former cocaine/crack last used yrs ago, marijuana last used 04/2021  ? Sexual activity: Not on file  ?Other Topics Concern  ? Not on file  ?Social History Narrative  ? Not on file  ? ?Social Determinants of Health   ? ?Financial Resource Strain: Not on file  ?Food Insecurity: Not on file  ?Transportation Needs: Not on file  ?Physical Activity: Not on file  ?Stress: Not on file  ?Social Connections: Not on file  ?Intimate Partner Violence: Not on file  ? ? ?Family History  ?Problem Relation Age of Onset  ? Hypertension Mother   ? Hypertension Maternal Grandmother   ? Cancer Maternal Grandfather   ? ? ?Past Surgical History:  ?Procedure Laterality Date  ? AMPUTATION Left 05/30/2019  ? Procedure: REVISION AMPUTATION OF L INDEX FINGER;  Surgeon: Leanora Cover, MD;  Location: [place];  Service: Orthopedics;  Laterality: Left;  ? ANEURYSM COILING  05/19/2021  ? Anterior communicating aneurysm  ? IR ANGIO INTRA EXTRACRAN SEL INTERNAL CAROTID UNI R MOD SED  05/20/2021  ? IR ANGIO INTRA EXTRACRAN SEL INTERNAL CAROTID UNI R MOD SED  02/08/2022  ? IR ANGIOGRAM FOLLOW UP STUDY  05/20/2021  ? IR GASTROSTOMY TUBE MOD SED  06/15/2021  ? IR GASTROSTOMY TUBE REMOVAL  12/03/2021  ? IR TRANSCATH/EMBOLIZ  05/20/2021  ? IR US GUIDE VASC ACCESS RIGHT  02/08/2022  ? LAPAROSCOPIC REVISION VENTRICULAR-PERITONEAL (V-P) SHUNT Right 06/08/2021  ? Procedure: LAPAROSCOPIC ASSISTED VENTRICULAR-PERITONEAL (V-P) SHUNT;  Surgeon: Georganna Skeans, MD;  Location: [place];  Service: General;  Laterality: Right;  ? RADIOLOGY WITH ANESTHESIA N/A 05/20/2021  ? Procedure: IR WITH ANESTHESIA;  Surgeon: Consuella Lose, MD;  Location: [place];  Service: Radiology;  Laterality: N/A;  ? SHOULDER ARTHROSCOPY WITH OPEN ROTATOR CUFF REPAIR AND DISTAL CLAVICLE ACROMINECTOMY Right 07/03/2020  ? Procedure: SHOULDER ARTHROSCOPY WITH OPEN ROTATOR CUFF REPAIR AND DISTAL CLAVICLE ARTHROSCOPIC DEBRIDEMENT;  Surgeon: Earlie Server, MD;  Location: [facility];  Service: Orthopedics;  Laterality: Right;  ? SHOULDER CLOSED REDUCTION Right 10/28/2020  ? Procedure: CLOSED MANIPULATION SHOULDER;  Surgeon: Earlie Server, MD;  Location: [place];  Service: Orthopedics;   Laterality: Right;  ? toe amputaion Left yrs ago  ? VENTRICULOPERITONEAL SHUNT N/A 06/08/2021  ? Procedure: SHUNT INSERTION VENTRICULAR-PERITONEAL;  Surgeon: Consuella Lose, MD;  Location: [place];  Service: Neurosurgery;  Laterality: N/A;  ? ? ?ROS: ?Review of Systems ?Negative except as stated above ? ?PHYSICAL EXAM: ?BP (!) 148/97   Pulse 75   Resp 16   Ht 5\' 4"  (1.626 m)   Wt 149 lb 9.6 oz (67.9 kg)   SpO2 95%   BMI 25.68 kg/m?   ?Physical Exam ? ? ?General appearance - alert, well appearing, older African-American male and in no distress ?Mental status -patient is a little forgetful but answers most questions appropriately to the best of his ability. ?Eyes -pink conjunctiva ?Mouth - mucous membranes moist, pharynx normal without lesions tonsils enlarged right greater than left ?Neck - supple, no significant adenopathy ?Chest - clear to  auscultation, no wheezes, rales or rhonchi, symmetric air entry ?Heart - normal rate, regular rhythm, normal S1, S2, no murmurs, rubs, clicks or gallops ?Neurological -speech is normal. ?Grip 3/5 right, 4/5 the left.  Power proximally and distally in the upper extremities 5/5 bilaterally.  Power in the lower extremities 5/5 distally, 4+/5 proximally.  Gait is slow with low foot to floor clearance.  He ambulates independently. ?Extremities -no lower extremity edema.  Good peripheral pulses. ?MSK: Enlargement of the MCP joints of the second and third fingers bilaterally.  Mild enlargement of the PIP and DIP joints. ? ?  03/28/2022  ?  8:47 AM  ?Depression screen PHQ 2/9  ?Decreased Interest 1  ?Down, Depressed, Hopeless 1  ?PHQ - 2 Score 2  ?Altered sleeping 3  ?Tired, decreased energy 2  ?Change in appetite 0  ?Feeling bad or failure about yourself  2  ?Trouble concentrating 0  ?Moving slowly or fidgety/restless 0  ?Suicidal thoughts 1  ?PHQ-9 Score 10  ? ? ?  03/28/2022  ?  8:47 AM  ?GAD 7 : Generalized Anxiety Score  ?Nervous, Anxious, on Edge 2  ?Control/stop worrying 2   ?Worry too much - different things 3  ?Trouble relaxing 0  ?Restless 0  ?Easily annoyed or irritable 2  ?Afraid - awful might happen 3  ?Total GAD 7 Score 12  ? ? ? ? ?  Latest Ref Rng & Units 02/08/2022  ?  8:18 AM

## 2022-03-29 ENCOUNTER — Other Ambulatory Visit: Payer: Self-pay | Admitting: Internal Medicine

## 2022-03-29 LAB — COMPREHENSIVE METABOLIC PANEL
ALT: 12 IU/L (ref 0–44)
AST: 12 IU/L (ref 0–40)
Albumin/Globulin Ratio: 1.7 (ref 1.2–2.2)
Albumin: 4.5 g/dL (ref 3.8–4.9)
Alkaline Phosphatase: 83 IU/L (ref 44–121)
BUN/Creatinine Ratio: 9 — ABNORMAL LOW (ref 10–24)
BUN: 7 mg/dL — ABNORMAL LOW (ref 8–27)
Bilirubin Total: 0.3 mg/dL (ref 0.0–1.2)
CO2: 22 mmol/L (ref 20–29)
Calcium: 9.3 mg/dL (ref 8.6–10.2)
Chloride: 106 mmol/L (ref 96–106)
Creatinine, Ser: 0.75 mg/dL — ABNORMAL LOW (ref 0.76–1.27)
Globulin, Total: 2.7 g/dL (ref 1.5–4.5)
Glucose: 83 mg/dL (ref 70–99)
Potassium: 4.2 mmol/L (ref 3.5–5.2)
Sodium: 141 mmol/L (ref 134–144)
Total Protein: 7.2 g/dL (ref 6.0–8.5)
eGFR: 103 mL/min/{1.73_m2} (ref >59)

## 2022-03-29 LAB — LIPID PANEL
Chol/HDL Ratio: 4.1 ratio (ref 0.0–5.0)
Cholesterol, Total: 199 mg/dL (ref 100–199)
HDL: 48 mg/dL (ref >39)
LDL Chol Calc (NIH): 133 mg/dL — ABNORMAL HIGH (ref 0–99)
Triglycerides: 100 mg/dL (ref 0–149)
VLDL Cholesterol Cal: 18 mg/dL (ref 5–40)

## 2022-03-29 LAB — VITAMIN D 25 HYDROXY (VIT D DEFICIENCY, FRACTURES): Vit D, 25-Hydroxy: 38.3 ng/mL (ref 30.0–100.0)

## 2022-03-29 LAB — PSA: Prostate Specific Ag, Serum: 0.6 ng/mL (ref 0.0–4.0)

## 2022-03-29 MED ORDER — ATORVASTATIN CALCIUM 10 MG PO TABS: 10.0000 mg | ORAL_TABLET | Freq: Every day | ORAL | 3 refills | Status: DC

## 2022-03-29 NOTE — Telephone Encounter (Signed)
Requested medications are due for refill today.  no ? ?Requested medications are on the active medications list.  yes ? ?Last refill. 03/28/2022 #5 1 refill ? ?Future visit scheduled.   yes ? ?Notes to clinic.  Medication was refilled yesterday. Medication refills and refusals are not delegated. ? ? ? ?Requested Prescriptions  ?Pending Prescriptions Disp Refills  ? Vitamin D, Ergocalciferol, (DRISDOL) 1.25 MG (50000 UNIT) CAPS capsule [Pharmacy Med Name: VITAMIN D2 50,000IU (ERGO) CAP RX] 12 capsule   ?  Sig: TAKE 1 CAPSULE BY MOUTH 1 TIME A WEEK  ?  ? Endocrinology:  Vitamins - Vitamin D Supplementation 2 Failed - 03/28/2022 10:17 AM  ?  ?  Failed - Manual Review: Route requests for 50,000 IU strength to the provider  ?  ?  Failed - Vitamin D in normal range and within 360 days  ?  Vit D, 25-Hydroxy  ?Date Value Ref Range Status  ?03/28/2022 38.3 30.0 - 100.0 ng/mL Final  ?  Comment:  ?  Vitamin D deficiency has been defined by the Institute of ?Medicine and an Endocrine Society practice guideline as a ?level of serum 25-OH vitamin D less than 20 ng/mL (1,2). ?The Endocrine Society went on to further define vitamin D ?insufficiency as a level between 21 and 29 ng/mL (2). ?1. IOM (Institute of Medicine). 2010. Dietary reference ?   intakes for calcium and D. [place]: The ?   [employer]. ?2. Holick MF, [place], Bischoff-Ferrari HA, et al. ?   Evaluation, treatment, and prevention of vitamin D ?   deficiency: an Endocrine Society clinical practice ?   guideline. JCEM. 2011 Jul; 96(7):1911-30. ?  ?  ?  ?  ?  Passed - Ca in normal range and within 360 days  ?  Calcium  ?Date Value Ref Range Status  ?03/28/2022 9.3 8.6 - 10.2 mg/dL Final  ? ?Calcium, Total  ?Date Value Ref Range Status  ?12/14/2013 8.4 (L) 8.5 - 10.1 mg/dL Final  ?  ?  ?  ?  Passed - Valid encounter within last 12 months  ?  Recent Outpatient Visits   ? ?      ? Yesterday Establishing care with new doctor, encounter for  ? [facility] Marcine Matar, MD  ? ?  ?  ?Future Appointments   ? ?        ? In 1 week Lois Huxley, Cornelius Moras, RPH-CPP [facility]  ? In 4 months Marcine Matar, MD [facility]  ? ?  ? ?  ?  ?  ?  ?

## 2022-03-29 NOTE — Progress Notes (Signed)
Let patient and his mother know kidney and liver function levels are good.  PSA level is normal.  This is the screening test for prostate cancer.  Vitamin D level is normal.  Cholesterol level elevated at 133 with goal being less than 100.  High cholesterol increases risk for heart attack and strokes.  I recommend starting low-dose of cholesterol-lowering medication called atorvastatin.  Prescription sent to his pharmacy. ?The rest of this is for my information. ?The 10-year ASCVD risk score (Arnett DK, et al., 2019) is: 47% ?  Values used to calculate the score: ?    Age: 61 years ?    Sex: Male ?    Is Non-Hispanic African American: Yes ?    Diabetic: Yes ?    Tobacco smoker: Yes ?    Systolic Blood Pressure: 148 mmHg ?    Is BP treated: Yes ?    HDL Cholesterol: 48 mg/dL ?    Total Cholesterol: 199 mg/dL ?

## 2022-04-07 NOTE — Progress Notes (Signed)
? ?S:    ?[name] [name] [name] is a 61 y.o. male who presents for hypertension evaluation, education, and management. 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 Patient was referred and last seen by Primary Care Provider, Dr. Laural Benes, on 03/28/22. At last visit, BP was 148/97 and he was started on amlodipine.  ? ?Today, patient arrives in good spirits and presents without assistance accompanied by his mother. Some occasional dizziness when showering or walking. Still having ongoing headaches. No blurred vision, swelling.  ? ?Family/Social history:  ?-HTN in mother and grandmother ?-Still smoking 3-4 cigarettes a wk. Same prior to [name]. Smoked for 30-40 yrs.  Ready to quit.  Stayed clean of ETOH and street drugs since hospitalization.  ? ?Medication adherence reported. Patient has not taken BP medications today. His mother manages his medications.  ? ?Current antihypertensives include: amlodipine 5 mg daily ? ?Antihypertensives tried in the past include: none ? ?Reported home BP readings: checks daily with a bicep cuff. Log of readings: 119/74, 128/80, 122/77, 131/85, 132/91, 134/86, 113/83 ? ?Patient reported dietary habits: 1 cup of coffee, does eat salt (cooks and adds to food) ? ?Patient-reported exercise habits: walks about 30 minutes per day ? ?O:  ?Vitals:  ? 04/11/22 1507  ?BP: 107/75  ?Pulse: 75  ? ?Last 3 Office BP readings: ?BP Readings from Last 3 Encounters:  ?04/11/22 107/75  ?03/28/22 (!) 148/97  ?02/08/22 131/88  ? ?BMET ?   ?Component Value Date/Time  ? NA 141 03/28/2022 1019  ? NA 137 12/14/2013 0530  ? K 4.2 03/28/2022 1019  ? K 3.4 (L) 12/14/2013 0530  ? CL 106 03/28/2022 1019  ? CL 105 12/14/2013 0530  ? CO2 22 03/28/2022 1019  ? CO2 26 12/14/2013 0530  ? GLUCOSE 83 03/28/2022 1019  ? GLUCOSE 98 02/08/2022 0818  ? GLUCOSE 124 (H) 12/14/2013 0530  ? BUN 7 (L) 03/28/2022 1019  ? BUN 11 12/14/2013  0530  ? CREATININE 0.75 (L) 03/28/2022 1019  ? CREATININE 0.81 12/14/2013 0530  ? CALCIUM 9.3 03/28/2022 1019  ? CALCIUM 8.4 (L) 12/14/2013 0530  ? GFRNONAA >60 02/08/2022 0818  ? GFRNONAA >60 12/14/2013 0530  ? GFRAA >60 05/30/2019 1258  ? GFRAA >60 12/14/2013 0530  ? ?Renal function: ?CrCl cannot be calculated (Unknown ideal weight.). ? ?Clinical ASCVD: No  ?The 10-year ASCVD risk score (Arnett DK, et al., 2019) is: 28.9% ?  Values used to calculate the score: ?    Age: 37 years ?    Sex: Male ?    Is Non-Hispanic African American: Yes ?    Diabetic: Yes ?    Tobacco smoker: Yes ?    Systolic Blood Pressure: 107 mmHg ?    Is BP treated: Yes ?    HDL Cholesterol: 48 mg/dL ?    Total Cholesterol: 199 mg/dL ? ?A/P: ?Hypertension diagnosed currently controlled on current medications after starting amlodipine. BP goal < 130/80 mmHg. Medication adherence appears appropriate.  ?-Continue amlodipine 5 mg daily.  ?-Counseled on lifestyle modifications for blood pressure control including reduced dietary sodium, increased exercise, adequate sleep. ?-Encouraged patient to check BP at home and bring log of readings to next visit. Counseled on proper use of home BP cuff.  ? ?Educated patient that Dr. Laural Benes sent in a medication for his cholesterol called atorvastatin. They will pick this up from the pharmacy and start taking it.  ? ?Results reviewed  and written information provided. Patient verbalized understanding of treatment plan. Total time in face-to-face counseling 25 minutes.  ? ?F/u clinic visit in 2 months with pharmacist for BP recheck to confirm still at goal. Next visit with PCP 07/28/22.  ? ?Pervis Hocking, PharmD ?PGY2 Ambulatory Care Pharmacy Resident ?04/11/2022 4:45 PM ? ?

## 2022-04-11 ENCOUNTER — Telehealth: Payer: Self-pay | Admitting: Internal Medicine

## 2022-04-11 ENCOUNTER — Other Ambulatory Visit: Payer: Self-pay

## 2022-04-11 ENCOUNTER — Ambulatory Visit: Payer: Medicaid Other | Attending: Internal Medicine | Admitting: Pharmacist

## 2022-04-11 VITALS — BP 107/75 | HR 75

## 2022-04-11 DIAGNOSIS — I10 Essential (primary) hypertension: Secondary | ICD-10-CM

## 2022-04-11 DIAGNOSIS — I728 Aneurysm of other specified arteries: Secondary | ICD-10-CM | POA: Diagnosis not present

## 2022-04-11 DIAGNOSIS — Z1211 Encounter for screening for malignant neoplasm of colon: Secondary | ICD-10-CM

## 2022-04-11 DIAGNOSIS — I609 Nontraumatic subarachnoid hemorrhage, unspecified: Secondary | ICD-10-CM | POA: Insufficient documentation

## 2022-04-11 DIAGNOSIS — Z982 Presence of cerebrospinal fluid drainage device: Secondary | ICD-10-CM | POA: Diagnosis not present

## 2022-04-11 DIAGNOSIS — R7303 Prediabetes: Secondary | ICD-10-CM | POA: Insufficient documentation

## 2022-04-11 DIAGNOSIS — G910 Communicating hydrocephalus: Secondary | ICD-10-CM | POA: Diagnosis not present

## 2022-04-11 MED ORDER — AMLODIPINE BESYLATE 5 MG PO TABS: 5.0000 mg | ORAL_TABLET | Freq: Every day | ORAL | 1 refills | Status: AC

## 2022-04-11 MED ORDER — AMLODIPINE BESYLATE 10 MG PO TABS: 10.0000 mg | ORAL_TABLET | Freq: Every day | ORAL | 1 refills | Status: DC

## 2022-04-11 NOTE — Telephone Encounter (Signed)
Copied from [place] [id number]. Topic: General - Other ?>> Apr 11, 2022  9:44 AM Tessa Lerner A wrote: ?Reason for CRM: The patient's mother would like to speak with a member of staff regarding the patient's stool sample testing kit ? ?The patient's mother is having a difficult time getting the sample  ? ?Please contact further when possible ?

## 2022-04-11 NOTE — Telephone Encounter (Signed)
Pt came into the office today with mother because he had an appt with Franky Macho. Made pt and mother aware aware that we will place a referral for GI to have colonoscopy done. Pt and mother doesn't have any questions or concerns  ?

## 2022-05-02 NOTE — Progress Notes (Signed)
? ?Referring:  ?[name] Matar, MD ?[address] [place] ?[address] ?[place],  [name] [address] ? ?PCP: ?[name] Matar, MD ? ?Neurology was asked to evaluate [name] [name], a 61 year old male for a chief complaint of headaches.  Our recommendations of care will be communicated by shared medical record.   ? ?CC:  headaches ? ?History provided from self and mother [name] [name] ? ?HPI:  ?Medical co-morbidities: SAH 2/2 ACA aneurysm s/p coil embolization (04/2021), communicating hydrocephalus s/p VP shunt, HTN, prediabetes. ? ?The patient presents for evaluation of headaches which began following a ruptured ACA aneurysm in May 2022. This was complicated by hydrocephalus and he is s/p VP shunt. Since the aneurysm he has suffered from constant daily headaches. They have remained stable and have not gotten better or worse over time. Pain fluctuates in severity but is always present. Headache is described as bifrontal throbbing. He denies photophobia, phonophobia, or nausea, but reports that headache does become severe enough that he will need to lie down. ? ?He currently takes Tylenol for his headaches every day. This does not help much. ? ?He follows with NSGY for aneurysm and VP shunt. Was last seen in January 2023. Repeat cerebral angiogram in February 2023 showed a stable coiled aneurysm. ? ?Headache History: ?Onset: May 2022 ?Triggers: none ?Aura: none ?Location: bifrontal ?Quality/Description: throbbing ?Associated Symptoms: ? Photophobia: no ? Phonophobia: no ? Nausea: no ?Worse with activity?: yes ?Duration of headaches: constant ? ? ?Headache days per month: 30 ?Headache free days per month: 0 ? ?Current Treatment: ?Abortive ?Tylenol ? ?Preventative ?none ? ?Prior Therapies                                 ?amlodipine ?Zoloft 50 mg daily ? ?LABS: ?CBC ?   ?Component Value Date/Time  ? WBC 4.9 02/08/2022 0818  ? RBC 4.47 02/08/2022 0818  ? HGB 14.0 02/08/2022 0818  ? HGB 12.0 (L) 12/14/2013 0530  ? HCT 41.3 02/08/2022 0818   ? HCT 35.5 (L) 12/14/2013 0530  ? PLT PLATELET CLUMPS NOTED ON SMEAR, UNABLE TO ESTIMATE 02/08/2022 0818  ? PLT 252 12/14/2013 0530  ? MCV 92.4 02/08/2022 0818  ? MCV 95 12/14/2013 0530  ? MCH 31.3 02/08/2022 0818  ? MCHC 33.9 02/08/2022 0818  ? RDW 14.1 02/08/2022 0818  ? RDW 13.0 12/14/2013 0530  ? LYMPHSABS 1.8 02/08/2022 0818  ? LYMPHSABS 1.5 12/14/2013 0530  ? MONOABS 0.4 02/08/2022 0818  ? MONOABS 0.6 12/14/2013 0530  ? EOSABS 0.3 02/08/2022 0818  ? EOSABS 0.4 12/14/2013 0530  ? BASOSABS 0.0 02/08/2022 0818  ? BASOSABS 0.1 12/14/2013 0530  ? ? ?  Latest Ref Rng & Units 03/28/2022  ? 10:19 AM 02/08/2022  ?  8:18 AM 07/03/2021  ?  6:16 AM  ?CMP  ?Glucose 70 - 99 mg/dL 83   98   953    ?BUN 8 - 27 mg/dL 7   10   12     ?Creatinine 0.76 - 1.27 mg/dL   9.67   2.89    ?Sodium 134 - 144 mmol/L 141   139   136    ?Potassium 3.5 - 5.2 mmol/L 4.2   4.0   3.8    ?Chloride 96 - 106 mmol/L 106   108   101    ?CO2 20 - 29 mmol/L 22   23   26     ?Calcium 8.6 -  10.2 mg/dL 9.3   9.6   9.5    ?Total Protein 6.0 - 8.5 g/dL 7.2      ?Total Bilirubin 0.0 - 1.2 mg/dL 0.3      ?Alkaline Phos 44 - 121 IU/L 83      ?AST 0 - 40 IU/L 12      ?ALT 0 - 44 IU/L 12      ? ? ? ?IMAGING:  ?CTH 06/02/21: ?1. Significantly increased ventriculomegaly, compatible with ?worsening hydrocephalus. ?2. Small (7 mm) focus of hyperdense hemorrhage in the left ?pericallosal frontal lobe is concerning for a new/interval site of ?acute hemorrhage given no definite hyperdense hemorrhage at this ?site on the prior, although a direct comparison is limited given ?evolving blood products. Recommend attention on close interval ?follow-up. ?3. Otherwise, improving intraparenchymal, subarachnoid, and ?intraventricular hemorrhage with edema and suspected developing ?encephalomalacia in the inferior left frontal lobe and corpus ?callosum. ?  ?Cerebral angiogram 02/08/22: ?1. Persistent occlusion of a right A1 A2 junction aneurysm 8 months ?after subarachnoid hemorrhage  and coil embolization. ? ?Imaging independently reviewed on May 03, 2022  ? ?Current Outpatient Medications on File Prior to Visit  ?Medication Sig Dispense Refill  ? acetaminophen (TYLENOL) 325 MG tablet Take 2 tablets (650 mg total) by mouth 2 (two) times daily as needed for mild pain (or temp > 37.5 C (99.5 F)).    ? amLODipine (NORVASC) 5 MG tablet Take 1 tablet (5 mg total) by mouth daily. 90 tablet 1  ? atorvastatin (LIPITOR) 10 MG tablet Take 1 tablet (10 mg total) by mouth daily. 90 tablet 3  ? diclofenac Sodium (VOLTAREN) 1 % GEL Apply 2 g topically 4 (four) times daily. 100 g 2  ? nicotine (NICODERM CQ - DOSED IN MG/24 HOURS) 14 mg/24hr patch Place 1 patch (14 mg total) onto the skin daily. 28 patch 1  ? polyethylene glycol (MIRALAX / GLYCOLAX) 17 g packet Take 17 g by mouth 2 (two) times a week. 30 each 4  ? QUEtiapine (SEROQUEL) 25 MG tablet Take 0.5 tablets (12.5 mg total) by mouth at bedtime. 30 tablet 2  ? senna-docusate (SENOKOT-S) 8.6-50 MG tablet Take 1 tablet by mouth 2 (two) times daily as needed for mild constipation. 60 tablet 3  ? sertraline (ZOLOFT) 50 MG tablet Take 1 tablet (50 mg total) by mouth daily. 30 tablet 4  ? Vitamin D, Ergocalciferol, (DRISDOL) 1.25 MG (50000 UNIT) CAPS capsule TAKE 1 CAPSULE BY MOUTH 1 TIME A WEEK 12 capsule 0  ? ?No current facility-administered medications on file prior to visit.  ? ? ? ?Allergies: ?No Known Allergies ? ?Family History: ?Migraine or other headaches in the family:  none ?Aneurysms in a first degree relative:  none ?Brain tumors in the family:  none ?Other neurological illness in the family:   none ? ?Past Medical History: ?Past Medical History:  ?Diagnosis Date  ? Alcohol abuse   ? Drug abuse (HCC)   ? Right rotator cuff tear   ? Seasonal allergies   ? ? ?Past Surgical History ?Past Surgical History:  ?Procedure Laterality Date  ? AMPUTATION Left 05/30/2019  ? Procedure: REVISION AMPUTATION OF L INDEX FINGER;  Surgeon: Betha Loa, MD;  Location:  [facility] OR;  Service: Orthopedics;  Laterality: Left;  ? ANEURYSM COILING  05/19/2021  ? Anterior communicating aneurysm  ? IR ANGIO INTRA EXTRACRAN SEL INTERNAL CAROTID UNI R MOD SED  05/20/2021  ? IR ANGIO INTRA EXTRACRAN SEL INTERNAL CAROTID UNI R MOD  SED  02/08/2022  ? IR ANGIOGRAM FOLLOW UP STUDY  05/20/2021  ? IR GASTROSTOMY TUBE MOD SED  06/15/2021  ? IR GASTROSTOMY TUBE REMOVAL  12/03/2021  ? IR TRANSCATH/EMBOLIZ  05/20/2021  ? IR US GUIDE VASC ACCESS RIGHT  02/08/2022  ? LAPAROSCOPIC REVISION VENTRICULAR-PERITONEAL (V-P) SHUNT Right 06/08/2021  ? Procedure: LAPAROSCOPIC ASSISTED VENTRICULAR-PERITONEAL (V-P) SHUNT;  Surgeon: Violeta Gelinas, MD;  Location: [facility] OR;  Service: General;  Laterality: Right;  ? RADIOLOGY WITH ANESTHESIA N/A 05/20/2021  ? Procedure: IR WITH ANESTHESIA;  Surgeon: Lisbeth Renshaw, MD;  Location: [facility] OR;  Service: Radiology;  Laterality: N/A;  ? SHOULDER ARTHROSCOPY WITH OPEN ROTATOR CUFF REPAIR AND DISTAL CLAVICLE ACROMINECTOMY Right 07/03/2020  ? Procedure: SHOULDER ARTHROSCOPY WITH OPEN ROTATOR CUFF REPAIR AND DISTAL CLAVICLE ARTHROSCOPIC DEBRIDEMENT;  Surgeon: Frederico Hamman, MD;  Location: [facility];  Service: Orthopedics;  Laterality: Right;  ? SHOULDER CLOSED REDUCTION Right 10/28/2020  ? Procedure: CLOSED MANIPULATION SHOULDER;  Surgeon: Frederico Hamman, MD;  Location: [facility];  Service: Orthopedics;  Laterality: Right;  ? toe amputaion Left yrs ago  ? VENTRICULOPERITONEAL SHUNT N/A 06/08/2021  ? Procedure: SHUNT INSERTION VENTRICULAR-PERITONEAL;  Surgeon: Lisbeth Renshaw, MD;  Location: [facility] OR;  Service: Neurosurgery;  Laterality: N/A;  ? ? ?Social History: ?Social History  ? ?Tobacco Use  ? Smoking status: Every Day  ?  Packs/day: 0.25  ?  Years: 30.00  ?  Pack years: 7.50  ?  Types: Cigarettes  ? Smokeless tobacco: Never  ?Vaping Use  ? Vaping Use: Never used  ?Substance Use Topics  ? Alcohol use: Yes  ?  Alcohol/week: 3.0 standard drinks  ?  Types: 3  Cans of beer per week  ?  Comment: daily 3-5 beers per day, sometimes none  ? Drug use: Yes  ?  Types: Marijuana, "Crack" cocaine  ?  Comment: former cocaine/crack last used yrs ago, marijuana last u

## 2022-05-03 ENCOUNTER — Encounter: Payer: Self-pay | Admitting: Psychiatry

## 2022-05-03 ENCOUNTER — Ambulatory Visit: Payer: Medicaid Other | Admitting: Psychiatry

## 2022-05-03 VITALS — BP 120/82 | HR 69 | Ht 65.0 in | Wt 144.0 lb

## 2022-05-03 DIAGNOSIS — I608 Other nontraumatic subarachnoid hemorrhage: Secondary | ICD-10-CM

## 2022-05-03 DIAGNOSIS — G43009 Migraine without aura, not intractable, without status migrainosus: Secondary | ICD-10-CM

## 2022-05-03 MED ORDER — UBRELVY 100 MG PO TABS: 100.0000 mg | ORAL_TABLET | ORAL | 3 refills | Status: DC | PRN

## 2022-05-03 MED ORDER — TOPIRAMATE 25 MG PO TABS: ORAL_TABLET | ORAL | 3 refills | Status: DC

## 2022-05-03 NOTE — Patient Instructions (Addendum)
Start Topamax daily for headache prevention. Take 25 mg (1 pill) at bedtime for one week, then increase to 50 mg (2 pills) at bedtime for one week, then take 75 mg (3 pills) at bedtime for one week, then take 100 mg (4 pills) at bedtime. Let me know when you reach 4 pills a day and I can send in 100 mg tablets ?Start [place] as needed for migraines. Take 50-100 mg at earliest sign of migraine. May repeat once in 2 hours.  ?

## 2022-05-05 ENCOUNTER — Telehealth: Payer: Self-pay | Admitting: Psychiatry

## 2022-05-05 ENCOUNTER — Other Ambulatory Visit: Payer: Self-pay | Admitting: Psychiatry

## 2022-05-05 MED ORDER — TOPIRAMATE 25 MG PO TABS: 25.0000 mg | ORAL_TABLET | Freq: Every day | ORAL | 3 refills | Status: DC

## 2022-05-05 NOTE — Telephone Encounter (Signed)
Called mother who stated both medications are too expensive. I informed her I'll let Dr Delena Bali know and will do PA on Ubrelvy. I will call her back. She verbalized understanding, appreciation. ?[employer], was on hold for extended time.  ?

## 2022-05-05 NOTE — Telephone Encounter (Signed)
Attempted to reach mother to inform her Dr Delena Bali sent in new Rx. No answer and VMB not set up. If she calls back phone staff may let her know to get new Rx at [facility].  ?

## 2022-05-05 NOTE — Telephone Encounter (Signed)
[place] [facility] [place] PA form completed, signed and faxed to [employer] [facility].  ?

## 2022-05-05 NOTE — Telephone Encounter (Signed)
Sure, I sent in a new rx for topamax 25 mg tablets for 30 days

## 2022-05-05 NOTE — Telephone Encounter (Signed)
Pt's mother, Jama Flavors (on [place]) pharmacy said medication is expensive and medicaid do not cover the cost. Would like a call from the nurse to discuss to alterative medication. ?

## 2022-05-09 ENCOUNTER — Telehealth: Payer: Self-pay | Admitting: *Deleted

## 2022-05-09 ENCOUNTER — Other Ambulatory Visit: Payer: Self-pay | Admitting: Psychiatry

## 2022-05-09 MED ORDER — NURTEC 75 MG PO TBDP: 75.0000 mg | ORAL_TABLET | ORAL | 3 refills | Status: DC | PRN

## 2022-05-09 NOTE — Telephone Encounter (Signed)
I'll send an rx for Nurtec and see if we have any luck with that.

## 2022-05-09 NOTE — Telephone Encounter (Signed)
Called  Tracks to check status of [place] PA. Spoke with Desma Maxim who stated ubrelvy denied. Paitient must not have headaches > 15 days a month for prior 6 months. Sent to MD.  ?Ref # for call [id number] ?

## 2022-05-09 NOTE — Telephone Encounter (Signed)
[place] [facility] Nurtec PA filled out, signed by Dr Delena Bali, faxed to [facility]. Received confirmation. ?

## 2022-05-10 ENCOUNTER — Other Ambulatory Visit: Payer: Medicaid Other

## 2022-05-10 NOTE — Telephone Encounter (Signed)
Called [place] [facility], spoke with [name] who stated Nurtec was approved 05/09/22 to 05/04/2023. Interaction # I [id number].  ?

## 2022-05-17 NOTE — Telephone Encounter (Signed)
Received message : Pt's mother, Jama Flavors said medicaid denied [place]. Would like a call from the nurse to discuss if there is another medication you can prescribe. Called pharmacy, spoke with Kathlene November who stated they needed Nurtec directions clarification for insurance purposes. I advised him patient can take 75 mg once a day as needed, max one tablet in 24 hours, 8 tablets in 30 days. He stated he will fill and system will call patient.  Called mother to advise her of new Rx that will be filled. No answer and voice MB not set up. If she calls back phone staff may inform her.

## 2022-05-17 NOTE — Telephone Encounter (Signed)
Pt's mother, Jama Flavors said medicaid denied [place]. Would like a call from the nurse to discuss if there is another medication you can prescribe.

## 2022-05-26 ENCOUNTER — Telehealth: Payer: Self-pay

## 2022-05-26 NOTE — Telephone Encounter (Signed)
Referral received for PCS, unsure who requested the services. I called patient's mother and she said she would like help for him with bathing, personal care.

## 2022-05-30 NOTE — Telephone Encounter (Signed)
Order for [facility] was faxed to [facility] 05/27/2022

## 2022-06-07 NOTE — Progress Notes (Unsigned)
S:    [name] [name] is a 61 y.o. male who presents for hypertension evaluation, education, and management. PMH is significant for 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 Patient was referred and last seen by Primary Care Provider, Dr. Laural Benes, on 03/28/22. At last visit, BP was 148/97 and he was started on amlodipine. At last pharmacist visit on 04/11/22, BP was now at goal.   Today, patient arrives in good spirits and presents without assistance accompanied by his mother. Some occasional dizziness when showering or walking. Still having ongoing headaches. No blurred vision, swelling.  ***confirm BP still at goal, home readings? Started atorva? Next PCP 8/3  Family/Social history:  -HTN in mother and grandmother -Still smoking 3-4 cigarettes a wk. Same prior to [facility]. Smoked for 30-40 yrs.  Ready to quit.  Stayed clean of ETOH and street drugs since hospitalization.   Medication adherence reported. Patient has not taken BP medications today. His mother manages his medications.   Current antihypertensives include: amlodipine 5 mg daily  Antihypertensives tried in the past include: none  Reported home BP readings: checks daily with a bicep cuff. Log of readings: 119/74, 128/80, 122/77, 131/85, 132/91, 134/86, 113/83  Patient reported dietary habits: 1 cup of coffee, does eat salt (cooks and adds to food)  Patient-reported exercise habits: walks about 30 minutes per day  O:  There were no vitals filed for this visit.  Last 3 Office BP readings: BP Readings from Last 3 Encounters:  05/03/22 120/82  04/11/22 107/75  03/28/22 (!) 148/97   BMET    Component Value Date/Time   NA 141 03/28/2022 1019   NA 137 12/14/2013 0530   K 4.2 03/28/2022 1019   K 3.4 (L) 12/14/2013 0530   CL 106 03/28/2022 1019   CL 105 12/14/2013 0530   CO2 22 03/28/2022 1019   CO2 26 12/14/2013 0530   GLUCOSE 83 03/28/2022  1019   GLUCOSE 98 02/08/2022 0818   GLUCOSE 124 (H) 12/14/2013 0530   BUN 7 (L) 03/28/2022 1019   BUN 11 12/14/2013 0530   CREATININE 0.75 (L) 03/28/2022 1019   CREATININE 0.81 12/14/2013 0530   CALCIUM 9.3 03/28/2022 1019   CALCIUM 8.4 (L) 12/14/2013 0530   GFRNONAA >60 02/08/2022 0818   GFRNONAA >60 12/14/2013 0530   GFRAA >60 05/30/2019 1258   GFRAA >60 12/14/2013 0530   Renal function: CrCl cannot be calculated (Patient's most recent lab result is older than the maximum 21 days allowed.).  Clinical ASCVD: No  The 10-year ASCVD risk score (Arnett DK, et al., 2019) is: 34.6%   Values used to calculate the score:     Age: 77 years     Sex: Male     Is Non-Hispanic African American: Yes     Diabetic: Yes     Tobacco smoker: Yes     Systolic Blood Pressure: 120 mmHg     Is BP treated: Yes     HDL Cholesterol: 48 mg/dL     Total Cholesterol: 199 mg/dL  A/P: Hypertension diagnosed currently controlled on current medications after starting amlodipine. BP goal < 130/80 mmHg. Medication adherence appears appropriate.  -Continue amlodipine 5 mg daily.  -Counseled on lifestyle modifications for blood pressure control including reduced dietary sodium, increased exercise, adequate sleep. -Encouraged patient to check BP at home and bring log of readings to next visit. Counseled on proper use of home BP cuff.   Educated  patient that Dr. Laural Benes sent in a medication for his cholesterol called atorvastatin. They will pick this up from the pharmacy and start taking it.   Results reviewed and written information provided. Patient verbalized understanding of treatment plan. Total time in face-to-face counseling 25 minutes.   F/u clinic visit in 2 months with pharmacist for BP recheck to confirm still at goal. Next visit with PCP 07/28/22.   Pervis Hocking, PharmD PGY2 Ambulatory Care Pharmacy Resident 06/07/2022 1:53 PM

## 2022-06-09 ENCOUNTER — Encounter (HOSPITAL_COMMUNITY): Payer: Self-pay

## 2022-06-09 ENCOUNTER — Ambulatory Visit (HOSPITAL_COMMUNITY)
Admission: EM | Admit: 2022-06-09 | Discharge: 2022-06-09 | Disposition: A | Discharge status: Home / Self Care | Payer: Medicaid Other | Attending: Nurse Practitioner | Admitting: Nurse Practitioner

## 2022-06-09 ENCOUNTER — Ambulatory Visit (INDEPENDENT_AMBULATORY_CARE_PROVIDER_SITE_OTHER): Payer: Medicaid Other

## 2022-06-09 DIAGNOSIS — M79641 Pain in right hand: Secondary | ICD-10-CM

## 2022-06-09 DIAGNOSIS — M25572 Pain in left ankle and joints of left foot: Secondary | ICD-10-CM

## 2022-06-09 DIAGNOSIS — M79672 Pain in left foot: Secondary | ICD-10-CM | POA: Diagnosis not present

## 2022-06-09 DIAGNOSIS — Y92009 Unspecified place in unspecified non-institutional (private) residence as the place of occurrence of the external cause: Secondary | ICD-10-CM

## 2022-06-09 DIAGNOSIS — W19XXXA Unspecified fall, initial encounter: Secondary | ICD-10-CM | POA: Diagnosis not present

## 2022-06-09 DIAGNOSIS — G8929 Other chronic pain: Secondary | ICD-10-CM

## 2022-06-09 NOTE — Discharge Instructions (Addendum)
[name] [name] This is your after visit summary for today.   Fall Education provided for prevention   Left foot pain Xray results non displaced fracture will have you to schedule an appointment with [facility]  Arthritis right hand xray results   Please feel free to call our office, if you have any questions. Have a good day. Thad Ranger NP

## 2022-06-09 NOTE — ED Triage Notes (Signed)
Pt states got dizzy and fell over a week ago and hurt his lt foot. States has been soaking in epson salt and taking his home pain meds.

## 2022-06-09 NOTE — ED Provider Notes (Addendum)
[place]    CSN: [id number] Arrival date & time: 06/09/22  0845      History   Chief Complaint Chief Complaint  Patient presents with   Fall   Foot Injury    HPI RAEKWAN KAUER is a 61 y.o. male.   HPI  He is in today because he suffered a fall while coming up his back step. He had to sit for a while before getting up. He has had pain in his left foot since the fall. He denies ay numbness and tingling. He has been able to walk but with some discomfort. He has a history of injury to his great toe with partial amputation.   He has been having constant right hand pain for greater than 6 months. He denies any injury. He is currently wearing a brace and use of Voltaren Gel . He does have some numbness and tingling with some weakness. He denies any swelling.   Past Medical History:  Diagnosis Date   Alcohol abuse    Drug abuse ([place])    Right rotator cuff tear    Seasonal allergies     Patient Active Problem List   Diagnosis Date Noted   Vitamin D deficiency 03/28/2022   Tobacco dependence 03/28/2022   Essential hypertension 03/28/2022   Substance abuse in remission ([place]) 03/28/2022   Arthritis pain, hand 03/28/2022   Dysphagia, pharyngoesophageal phase-tol D3 diet w/ thin liquids at dc 7/11 07/05/2021   Ambulatory dysfunction 07/05/2021   Persistent cognitive impairment after [facility] 07/05/2021   Communicating hydrocephalus ([place])    S/P VP shunt    Receptive aphasia    S/P percutaneous endoscopic gastrostomy (PEG) tube placement ([place])    Hyponatremia 05/28/2021   Aneurysmal subarachnoid hemorrhage ([place]) 05/28/2021   Vasospasm of cerebral artery 05/28/2021   Substance induced mood disorder ([place]) 10/31/2016    Past Surgical History:  Procedure Laterality Date   AMPUTATION Left 05/30/2019   Procedure: REVISION AMPUTATION OF L INDEX FINGER;  Surgeon: Leanora Cover, MD;  Location: [facility];  Service: Orthopedics;  Laterality: Left;   ANEURYSM COILING  05/19/2021    Anterior communicating aneurysm   IR ANGIO INTRA EXTRACRAN SEL INTERNAL CAROTID UNI R MOD SED  05/20/2021   IR ANGIO INTRA EXTRACRAN SEL INTERNAL CAROTID UNI R MOD SED  02/08/2022   IR ANGIOGRAM FOLLOW UP STUDY  05/20/2021   IR GASTROSTOMY TUBE MOD SED  06/15/2021   IR GASTROSTOMY TUBE REMOVAL  12/03/2021   IR TRANSCATH/EMBOLIZ  05/20/2021   IR US GUIDE VASC ACCESS RIGHT  02/08/2022   LAPAROSCOPIC REVISION VENTRICULAR-PERITONEAL (V-P) SHUNT Right 06/08/2021   Procedure: LAPAROSCOPIC ASSISTED VENTRICULAR-PERITONEAL (V-P) SHUNT;  Surgeon: Georganna Skeans, MD;  Location: [place];  Service: General;  Laterality: Right;   RADIOLOGY WITH ANESTHESIA N/A 05/20/2021   Procedure: IR WITH ANESTHESIA;  Surgeon: Consuella Lose, MD;  Location: [place];  Service: Radiology;  Laterality: N/A;   SHOULDER ARTHROSCOPY WITH OPEN ROTATOR CUFF REPAIR AND DISTAL CLAVICLE ACROMINECTOMY Right 07/03/2020   Procedure: SHOULDER ARTHROSCOPY WITH OPEN ROTATOR CUFF REPAIR AND DISTAL CLAVICLE ARTHROSCOPIC DEBRIDEMENT;  Surgeon: Earlie Server, MD;  Location: [facility];  Service: Orthopedics;  Laterality: Right;   SHOULDER CLOSED REDUCTION Right 10/28/2020   Procedure: CLOSED MANIPULATION SHOULDER;  Surgeon: Earlie Server, MD;  Location: [place];  Service: Orthopedics;  Laterality: Right;   toe amputaion Left yrs ago   VENTRICULOPERITONEAL SHUNT N/A 06/08/2021   Procedure: SHUNT INSERTION VENTRICULAR-PERITONEAL;  Surgeon: Kathyrn Sheriff,  Marlane Hatcher, MD;  Location[facility];  Service: Neurosurgery;  Laterality: N/A;       Home Medications    Prior to Admission medications   Medication Sig Start Date End Date Taking? Authorizing Provider  acetaminophen (TYLENOL) 325 MG tablet Take 2 tablets (650 mg total) by mouth 2 (two) times daily as needed for mild pain (or temp > 37.5 C (99.5 F)). 03/28/22   Marcine Matar, MD  amLODipine (NORVASC) 5 MG tablet Take 1 tablet (5 mg total) by mouth daily. 04/11/22    Marcine Matar, MD  atorvastatin (LIPITOR) 10 MG tablet Take 1 tablet (10 mg total) by mouth daily. 03/29/22   Marcine Matar, MD  diclofenac Sodium (VOLTAREN) 1 % GEL Apply 2 g topically 4 (four) times daily. 03/28/22   Marcine Matar, MD  nicotine (NICODERM CQ - DOSED IN MG/24 HOURS) 14 mg/24hr patch Place 1 patch (14 mg total) onto the skin daily. 03/28/22   Marcine Matar, MD  polyethylene glycol (MIRALAX / GLYCOLAX) 17 g packet Take 17 g by mouth 2 (two) times a week. 03/28/22   Marcine Matar, MD  QUEtiapine (SEROQUEL) 25 MG tablet Take 0.5 tablets (12.5 mg total) by mouth at bedtime. 03/28/22   Marcine Matar, MD  Rimegepant Sulfate (NURTEC) 75 MG TBDP Take 75 mg by mouth as needed. 05/09/22   Ocie Doyne, MD  senna-docusate (SENOKOT-S) 8.6-50 MG tablet Take 1 tablet by mouth 2 (two) times daily as needed for mild constipation. 03/28/22   Marcine Matar, MD  sertraline (ZOLOFT) 50 MG tablet Take 1 tablet (50 mg total) by mouth daily. 03/28/22   Marcine Matar, MD  topiramate (TOPAMAX) 25 MG tablet Take 1 tablet (25 mg total) by mouth at bedtime. 05/05/22   Ocie Doyne, MD  Vitamin D, Ergocalciferol, (DRISDOL) 1.25 MG (50000 UNIT) CAPS capsule TAKE 1 CAPSULE BY MOUTH 1 TIME A WEEK 03/29/22   Hoy Register, MD    Family History Family History  Problem Relation Age of Onset   Hypertension Mother    Hypertension Maternal Grandmother    Cancer Maternal Grandfather     Social History Social History   Tobacco Use   Smoking status: Every Day    Packs/day: 0.25    Years: 30.00    Total pack years: 7.50    Types: Cigarettes   Smokeless tobacco: Never  Vaping Use   Vaping Use: Never used  Substance Use Topics   Alcohol use: Yes    Alcohol/week: 3.0 standard drinks of alcohol    Types: 3 Cans of beer per week    Comment: daily 3-5 beers per day, sometimes none   Drug use: Yes    Types: Marijuana, "Crack" cocaine    Comment: former cocaine/crack last used yrs  ago, marijuana last used 04/2021     Allergies   Patient has no known allergies.   Review of Systems Review of Systems   Physical Exam Triage Vital Signs ED Triage Vitals  Enc Vitals Group     BP 06/09/22 0936 119/76     Pulse Rate 06/09/22 0936 82     Resp 06/09/22 0936 18     Temp 06/09/22 0936 98 F (36.7 C)     Temp Source 06/09/22 0936 Oral     SpO2 06/09/22 0936 98 %     Weight --      Height --      Head Circumference --  Peak Flow --      Pain Score 06/09/22 0937 6     Pain Loc --      Pain Edu? --      Excl. in GC? --    No data found.  Updated Vital Signs BP 119/76 (BP Location: Left Arm)   Pulse 82   Temp 98 F (36.7 C) (Oral)   Resp 18   SpO2 98%   Visual Acuity Right Eye Distance:   Left Eye Distance:   Bilateral Distance:    Right Eye Near:   Left Eye Near:    Bilateral Near:     Physical Exam Constitutional:      General: He is in acute distress (mild discomfort).  HENT:     Head: Normocephalic and atraumatic.  Cardiovascular:     Rate and Rhythm: Normal rate.     Pulses: Normal pulses.  Pulmonary:     Effort: Pulmonary effort is normal.  Musculoskeletal:     Left hand: Normal.       Hands:     Cervical back: Normal range of motion.       Feet:  Neurological:     Mental Status: He is alert.      UC Treatments / Results  Labs (all labs ordered are listed, but only abnormal results are displayed) Labs Reviewed - No data to display  EKG   Radiology No results found.  Procedures Procedures (including critical care time)  Medications Ordered in UC Medications - No data to display  Initial Impression / Assessment and Plan / UC Course  I have reviewed the triage vital signs and the nursing notes.  Pertinent labs & imaging results that were available during my care of the patient were reviewed by me and considered in my medical decision making (see chart for details).     Fall  Final Clinical Impressions(s)  / UC Diagnoses   Final diagnoses:  Fall in home, initial encounter  Pain of joint of left ankle and foot  Chronic hand pain, right     Discharge Instructions      Mr. Nichol This is your after visit summary for today.   Fall Education provided for prevention  Left foot pain Xray results  Arthritis right hand xray results   Please feel free to call our office, if you have any questions. Have a good day. Thad Ranger NP      ED Prescriptions   None    PDMP not reviewed this encounter.   Barbette Merino, NP 06/09/22 2155    Barbette Merino, NP 06/09/22 2158

## 2022-06-13 ENCOUNTER — Ambulatory Visit: Payer: Medicaid Other | Admitting: Pharmacist

## 2022-06-13 ENCOUNTER — Telehealth: Payer: Self-pay | Admitting: Internal Medicine

## 2022-06-13 NOTE — Telephone Encounter (Signed)
Copied from CRM [id number]. Topic: General - Other >> Jun 13, 2022 12:27 PM Faith T wrote: Reason for CRM: Pts mother called in stating the [facility] told her that the pt may need to go to a group home, due to her not being able to fully take care of him, and wanted to speak with someone about it, please advise.

## 2022-06-14 ENCOUNTER — Telehealth: Payer: Self-pay

## 2022-06-14 NOTE — Telephone Encounter (Signed)
I spoke to [employer] who confirmed receipt of the referral for [facility] and said that the patient had his assessment yesterday - 06/13/2022.

## 2022-06-15 NOTE — Telephone Encounter (Signed)
I spoke to patient's mother, [name].  She explained that she would like him to have more socialization.  It is getting difficult for her to care for him and meet all of his needs  She is considering a group home for him and she has a cousin who owns 2 group homes and needs to contact him for more information about group homes in general.  I provided her with the phone number for the [facility] # [phone] and explained to her that their community health workers may be able to provide additional information about group homes if she is interested.   We also discussed the services offered by [facility] and she was very interested. I provided her with the phone number for [facility] and encouraged her to call for more information and request a tour if she is still interested.  She was thinking the [facility] might be the best option at this point.

## 2022-06-16 DIAGNOSIS — M79672 Pain in left foot: Secondary | ICD-10-CM | POA: Insufficient documentation

## 2022-06-23 DIAGNOSIS — S92512A Displaced fracture of proximal phalanx of left lesser toe(s), initial encounter for closed fracture: Secondary | ICD-10-CM | POA: Insufficient documentation

## 2022-07-08 ENCOUNTER — Ambulatory Visit: Payer: Medicaid Other | Attending: Internal Medicine | Admitting: Pharmacist

## 2022-07-08 VITALS — BP 105/65

## 2022-07-08 DIAGNOSIS — Z79899 Other long term (current) drug therapy: Secondary | ICD-10-CM | POA: Insufficient documentation

## 2022-07-08 DIAGNOSIS — R42 Dizziness and giddiness: Secondary | ICD-10-CM | POA: Insufficient documentation

## 2022-07-08 DIAGNOSIS — F17210 Nicotine dependence, cigarettes, uncomplicated: Secondary | ICD-10-CM | POA: Diagnosis not present

## 2022-07-08 DIAGNOSIS — I10 Essential (primary) hypertension: Secondary | ICD-10-CM

## 2022-07-08 DIAGNOSIS — Z0130 Encounter for examination of blood pressure without abnormal findings: Secondary | ICD-10-CM | POA: Diagnosis not present

## 2022-07-08 DIAGNOSIS — Z8249 Family history of ischemic heart disease and other diseases of the circulatory system: Secondary | ICD-10-CM | POA: Diagnosis not present

## 2022-07-08 NOTE — Progress Notes (Signed)
S:    [name] [name] is a 61 y.o. male who presents for hypertension evaluation, education, and management. PMH is significant for [facility] 04/2021 due to ACA aneurysmal hemorrhage s/p post coil embolization, communicating hydrocephalus s/p VP shunt, prediabetes. Patient was referred and last seen by Primary Care Provider, Dr. Laural Benes, on 03/28/22. We saw him in April and his BP was good.   Since that visit, he was seen in the ED after a fall on 06/09/2022. Hand xrays were negative. L foot XR showed mild cortical buckling of the distal portion of the proximal phalanxof the second toe, possibly due to nondisplaced fracture. BP in the ED was 119/76 mmHg.   Today, patient arrives in good spirits and presents without assistance accompanied by his mother. Some occasional dizziness when showering or walking. No chest pains or shortness of breath.   Family/Social history:  -HTN in mother and grandmother -Still smoking 3-4 cigarettes a wk. Same prior to [facility]. Smoked for 30-40 yrs.  Ready to quit.  Stayed clean of ETOH and street drugs since hospitalization.   Medication adherence reported. Patient has not taken BP medications today. His mother manages his medications.   Current antihypertensives include: amlodipine 5 mg daily  Antihypertensives tried in the past include: none  Reported home BP readings: has not been checking at home recently  Patient reported dietary habits: 1 cup of coffee, does eat salt (cooks and adds to food)  Patient-reported exercise habits: walks about 30 minutes per day  O:  Vitals:   07/08/22 1617  BP: 105/65    Last 3 Office BP readings: BP Readings from Last 3 Encounters:  07/08/22 105/65  06/09/22 119/76  05/03/22 120/82   BMET    Component Value Date/Time   NA 141 03/28/2022 1019   NA 137 12/14/2013 0530   K 4.2 03/28/2022 1019   K 3.4 (L) 12/14/2013 0530   CL 106 03/28/2022 1019   CL 105 12/14/2013 0530   CO2 22 03/28/2022 1019   CO2 26 12/14/2013 0530    GLUCOSE 83 03/28/2022 1019   GLUCOSE 98 02/08/2022 0818   GLUCOSE 124 (H) 12/14/2013 0530   BUN 7 (L) 03/28/2022 1019   BUN 11 12/14/2013 0530   CREATININE 0.75 (L) 03/28/2022 1019   CREATININE 0.81 12/14/2013 0530   CALCIUM 9.3 03/28/2022 1019   CALCIUM 8.4 (L) 12/14/2013 0530   GFRNONAA >60 02/08/2022 0818   GFRNONAA >60 12/14/2013 0530   GFRAA >60 05/30/2019 1258   GFRAA >60 12/14/2013 0530   Renal function: CrCl cannot be calculated (Patient's most recent lab result is older than the maximum 21 days allowed.).  Clinical ASCVD: No  The 10-year ASCVD risk score (Arnett DK, et al., 2019) is: 28%   Values used to calculate the score:     Age: 61 years     Sex: Male     Is Non-Hispanic African American: Yes     Diabetic: Yes     Tobacco smoker: Yes     Systolic Blood Pressure: 105 mmHg     Is BP treated: Yes     HDL Cholesterol: 48 mg/dL     Total Cholesterol: 199 mg/dL  A/P: Hypertension diagnosed currently controlled on current medications after starting amlodipine. BP goal < 130/80 mmHg. Medication adherence appears appropriate.  -Continue amlodipine 5 mg daily.  -Counseled on lifestyle modifications for blood pressure control including reduced dietary sodium, increased exercise, adequate sleep. -Encouraged patient to check BP at home and bring log  of readings to next visit. Counseled on proper use of home BP cuff.   Results reviewed and written information provided. Patient verbalized understanding of treatment plan. Total time in face-to-face counseling 25 minutes.   F/u clinic visit with PCP next month.   Butch Penny, PharmD, P[place]y [place], CPP Clinical Pharmacist [facility] [phone]

## 2022-07-28 ENCOUNTER — Ambulatory Visit: Payer: Medicaid Other | Admitting: Internal Medicine

## 2022-08-17 ENCOUNTER — Telehealth: Payer: Self-pay | Admitting: Internal Medicine

## 2022-08-17 NOTE — Telephone Encounter (Signed)
For [name], pt mother, [name] [name], had spoke with you concerning [facility] for her son, she states she can not find that info and states he is going to be placed in a home and that she does not know if she keeps existing appts etc . [name] seems overwhelmed in the details of caring for her son and states she is very sorry but does need to touch base with you again and get herself straight FU at [phone]. [phone]. She states if not to the ph in time to pls leave a message and will return call.

## 2022-08-18 NOTE — Telephone Encounter (Signed)
I spoke to patient's mother, [name], and she explained that the patient will be starting with [facility] on 09/01/2022.  They are going to meet the doctors and staff on 08/30/2022.  She is very pleased with the facility.  He will start at their day program 2 days/week from 1030-1430 and they told her that the days/ times can be increased if needed.   She wanted to cancel the appointment with Dr Laural Benes scheduled for 10/17/2022 because he will be receiving his care at [facility].

## 2022-09-05 ENCOUNTER — Other Ambulatory Visit: Payer: Self-pay | Admitting: Vascular Surgery

## 2022-09-19 ENCOUNTER — Telehealth: Payer: Self-pay

## 2022-09-19 NOTE — Telephone Encounter (Signed)
Pt PCP Clemens Catholic from [facility] called and was asking can pt be switched from Nurtec PRN to [place] because of the cost and his new insurance no longer covers it. She is also asking would it be daily or just as needed. She stated to give her a call about any questions [phone]

## 2022-09-20 ENCOUNTER — Other Ambulatory Visit: Payer: Self-pay | Admitting: Psychiatry

## 2022-09-20 MED ORDER — QULIPTA 60 MG PO TABS
60.0000 mg | ORAL_TABLET | Freq: Every day | ORAL | 6 refills | Status: DC
Start: 2022-09-20 — End: 2023-02-01

## 2022-09-20 NOTE — Telephone Encounter (Signed)
We can switch to [name] [name]. It will need to be taken daily

## 2022-09-21 ENCOUNTER — Ambulatory Visit (INDEPENDENT_AMBULATORY_CARE_PROVIDER_SITE_OTHER): Payer: Medicaid Other | Admitting: Psychiatry

## 2022-09-21 VITALS — BP 124/81 | HR 68 | Ht 64.0 in | Wt 136.5 lb

## 2022-09-21 DIAGNOSIS — G43009 Migraine without aura, not intractable, without status migrainosus: Secondary | ICD-10-CM | POA: Diagnosis not present

## 2022-09-21 MED ORDER — TOPIRAMATE 25 MG PO TABS: ORAL_TABLET | ORAL | 6 refills | Status: DC

## 2022-09-21 MED ORDER — REYVOW 100 MG PO TABS: 100.0000 mg | ORAL_TABLET | ORAL | 5 refills | Status: AC | PRN

## 2022-09-21 NOTE — Patient Instructions (Signed)
Start Topamax for headache prevention. Take 25 mg (1 pill) at bedtime for one week, then increase to 50 mg (2 pills) at bedtime for one week, then take 75 mg (3 pills) at bedtime for one week, then take 100 mg (4 pills) at bedtime  Start Qulipta daily for headache prevention  Start Reyvow (lasmiditan) as needed for migraines

## 2022-09-21 NOTE — Progress Notes (Signed)
   CC:  headaches  Follow-up Visit  Last visit: 05/03/22  Brief HPI: 61 year old male with a history of SAH 2/2 ACA aneurysm s/p coil embolization (04/2021), communicating hydrocephalus s/p VP shunt, HTN, prediabetes who follows in clinic for headaches following ruptured ACA aneurysm.  At his last visit he was noted to be overusing Tylenol. He was started on Topamax for headache prevention. Roselyn Meier was started for rescue.  Interval History:  Headaches are still daily but they are no longer constant. Has not been taking Topamax consistently. Insurance denied Roselyn Meier, so Nurtec was prescribed. Nurtec did help, but his insurance stopped covering it. He was switched to [place] earlier this week and has not yet picked it up. He is currently not taking anything for migraine rescue.  Headache days per month: 30 Headache free days per month: 0  Current Headache Regimen: Preventative: Qulipta 60 mg daily, Topamax 25 mg QHS (taking inconsistently) Abortive: none   Prior Therapies                                  Amlodipine Zoloft 50 mg daily Topamax 25 mg QHS Qulipta 60 mg daily Nurtec - helped but insurance stopped covering  Physical Exam:   Vital Signs: BP 124/81   Pulse 68   Ht 5\' 4"  (1.626 m)   Wt 136 lb 8 oz (61.9 kg)   BMI 23.43 kg/m  GENERAL:  well appearing, in no acute distress, alert  SKIN:  Color, texture, turgor normal. No rashes or lesions HEAD:  Normocephalic/atraumatic. RESP: normal respiratory effort MSK:  No gross joint deformities.   NEUROLOGICAL: Mental Status: Alert, oriented to person, place and time, Follows commands, and Speech fluent and appropriate. Cranial Nerves: PERRL, face symmetric, no dysarthria, hearing grossly intact Motor: 4+/5 strength bilateral hip flexion, otherwise 5/5 throughout Gait: antalgic gait  IMPRESSION: 61 year old male with a history of SAH 2/2 ACA aneurysm s/p coil embolization (04/2021), communicating hydrocephalus s/p VP shunt,  HTN, prediabetes who presents for follow up of chronic migraines. He has not been taking Topamax consistently. Counseled him on taking medication daily as prescribed. His insurance no longer covers [place] or Nurtec. Will start Reyvow for rescue. Will continue Qulipta for now as he had improvement with gepants previously.  PLAN: -Prevention: Continue Qulipta 60 mg daily. Take Topamax 25 mg QHS x1 week, then increase by 25 mg weekly up to 100 mg QHS -Rescue: Start Reyvow 100 mg PRN -Next steps: consider CGRP, Botox for prevention   Follow-up: 4 months  I spent a total of 30 minutes on the date of the service. Headache education was done. Discussed treatment options including preventive and acute medications.  Discussed medication side effects, adverse reactions and drug interactions. Written educational materials and patient instructions outlining all of the above were given.  Genia Harold, MD 09/21/22 1:52 PM

## 2022-09-22 ENCOUNTER — Other Ambulatory Visit: Payer: Self-pay | Admitting: Family Medicine

## 2022-09-26 ENCOUNTER — Telehealth: Payer: Self-pay

## 2022-09-26 NOTE — Telephone Encounter (Signed)
That’s fine with me

## 2022-09-26 NOTE — Telephone Encounter (Signed)
FYI  Clemens Catholic NP from [facility] called to discuss pts medication. She asked if pt could try topamax and rayvow for now and if medications didn't work, he could start the [place]. She is trying to keep him on the least amount of medications possible and also keep cost down. Headaches have been manageable so far. I advised her that should be fine, will notify Dr Billey Gosling and if she disagree's I will let her know.  She was appreciative.

## 2022-10-17 ENCOUNTER — Ambulatory Visit: Payer: Self-pay | Admitting: Internal Medicine

## 2022-11-25 ENCOUNTER — Encounter: Payer: Self-pay | Admitting: Vascular Surgery

## 2022-11-25 ENCOUNTER — Encounter: Payer: Self-pay | Admitting: Family Medicine

## 2022-12-01 ENCOUNTER — Other Ambulatory Visit: Payer: Self-pay | Admitting: Vascular Surgery

## 2022-12-01 DIAGNOSIS — Z122 Encounter for screening for malignant neoplasm of respiratory organs: Secondary | ICD-10-CM

## 2023-01-06 ENCOUNTER — Ambulatory Visit
Admission: RE | Admit: 2023-01-06 | Discharge: 2023-01-06 | Disposition: A | Discharge status: Home / Self Care | Payer: Medicaid Other | Source: Ambulatory Visit | Attending: Vascular Surgery | Admitting: Vascular Surgery

## 2023-01-06 DIAGNOSIS — Z122 Encounter for screening for malignant neoplasm of respiratory organs: Secondary | ICD-10-CM

## 2023-01-26 ENCOUNTER — Ambulatory Visit: Payer: Medicaid Other | Admitting: Gastroenterology

## 2023-01-31 NOTE — Progress Notes (Unsigned)
CC:  headaches Chief Complaint  Patient presents with   Follow-up    Patient in room #8 with his mother. Patient states things has been up and down but not been having any headaches.    Follow-up Visit  Last visit: 09/21/2022 with Dr. 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  At prior visit, recommended continuation of Qulipta (ordered prior to visit but not yet started) and discussed taking topiramate daily as previously not using daily. He was started on Reyvow for rescue as insurance stopped coverage on [place].    Interval History:  Shortly after prior visit, [facility] contacted office requesting patient hold off on starting Qulipta and continue on topiramate to limit medication burden and cost.  Currently taking topiramate 100mg  nighlty. Reports great improvement of headaches, denies any recent headache occurrence over the past 30 days. Use of Reyvow with benefit but has not had to use recently.   Headache days per month: 0 Headache free days per month: 30  Current Headache Regimen: Preventative: Topamax 100 mg QHS  Abortive: Reyvow   Prior Therapies                                  Amlodipine Zoloft 50 mg daily Topamax 25 mg QHS Qulipta 60 mg daily Nurtec - helped but insurance stopped covering    Current Outpatient Medications on File Prior to Visit  Medication Sig Dispense Refill   acetaminophen (TYLENOL) 325 MG tablet Take 2 tablets (650 mg total) by mouth 2 (two) times daily as needed for mild pain (or temp > 37.5 C (99.5 F)).     amLODipine (NORVASC) 5 MG tablet Take 1 tablet (5 mg total) by mouth daily. 90 tablet 1   diclofenac Sodium (VOLTAREN) 1 % GEL Apply 2 g topically 4 (four) times daily. 100 g 2   Lasmiditan Succinate (REYVOW) 100 MG TABS Take 100 mg by  mouth as needed (for migraine). Max dose 1 pill in 24 hours. Avoid driving for 8 hours after taking this medication. 10 tablet 5   QUEtiapine (SEROQUEL) 25 MG tablet Take 0.5 tablets (12.5 mg total) by mouth at bedtime. 30 tablet 2   rosuvastatin (CRESTOR) 20 MG tablet Take 20 mg by mouth daily.     senna-docusate (SENOKOT-S) 8.6-50 MG tablet Take 1 tablet by mouth 2 (two) times daily as needed for mild constipation. 60 tablet 3   sertraline (ZOLOFT) 50 MG tablet Take 1 tablet (50 mg total) by mouth daily. 30 tablet 4   topiramate (TOPAMAX) 100 MG tablet Take 100 mg by mouth at bedtime.     Vitamin D, Ergocalciferol, (DRISDOL) 1.25 MG (50000 UNIT) CAPS capsule TAKE 1 CAPSULE BY MOUTH 1 TIME A WEEK 12 capsule 0   No current facility-administered medications on file prior to visit.   Past Medical History:  Diagnosis Date   Alcohol abuse    Drug abuse ([place])    Right rotator cuff tear    Seasonal allergies    Past Surgical History:  Procedure Laterality Date   AMPUTATION Left 05/30/2019   Procedure: REVISION AMPUTATION OF L INDEX FINGER;  Surgeon: Leanora Cover, MD;  Location: [place];  Service: Orthopedics;  Laterality: Left;   ANEURYSM COILING  05/19/2021  Anterior communicating aneurysm   IR ANGIO INTRA EXTRACRAN SEL INTERNAL CAROTID UNI R MOD SED  05/20/2021   IR ANGIO INTRA EXTRACRAN SEL INTERNAL CAROTID UNI R MOD SED  02/08/2022   IR ANGIOGRAM FOLLOW UP STUDY  05/20/2021   IR GASTROSTOMY TUBE MOD SED  06/15/2021   IR GASTROSTOMY TUBE REMOVAL  12/03/2021   IR TRANSCATH/EMBOLIZ  05/20/2021   IR US GUIDE VASC ACCESS RIGHT  02/08/2022   LAPAROSCOPIC REVISION VENTRICULAR-PERITONEAL (V-P) SHUNT Right 06/08/2021   Procedure: LAPAROSCOPIC ASSISTED VENTRICULAR-PERITONEAL (V-P) SHUNT;  Surgeon: Georganna Skeans, MD;  Location: [place];  Service: General;  Laterality: Right;   RADIOLOGY WITH ANESTHESIA N/A 05/20/2021   Procedure: IR WITH ANESTHESIA;  Surgeon: Consuella Lose, MD;  Location: [place];   Service: Radiology;  Laterality: N/A;   SHOULDER ARTHROSCOPY WITH OPEN ROTATOR CUFF REPAIR AND DISTAL CLAVICLE ACROMINECTOMY Right 07/03/2020   Procedure: SHOULDER ARTHROSCOPY WITH OPEN ROTATOR CUFF REPAIR AND DISTAL CLAVICLE ARTHROSCOPIC DEBRIDEMENT;  Surgeon: Earlie Server, MD;  Location[facility];  Service: Orthopedics;  Laterality: Right;   SHOULDER CLOSED REDUCTION Right 10/28/2020   Procedure: CLOSED MANIPULATION SHOULDER;  Surgeon: Earlie Server, MD;  Location: [place];  Service: Orthopedics;  Laterality: Right;   toe amputaion Left yrs ago   VENTRICULOPERITONEAL SHUNT N/A 06/08/2021   Procedure: SHUNT INSERTION VENTRICULAR-PERITONEAL;  Surgeon: Consuella Lose, MD;  Location: [place];  Service: Neurosurgery;  Laterality: N/A;       Physical Exam:   Vital Signs: BP 100/70 (BP Location: Left Arm, Patient Position: Sitting, Cuff Size: Small)   Pulse 83   Ht 5\' 4"  (1.626 m)   Wt 133 lb 3.2 oz (60.4 kg)   BMI 22.86 kg/m  GENERAL:  well appearing, in no acute distress, alert  SKIN:  Color, texture, turgor normal. No rashes or lesions HEAD:  Normocephalic/atraumatic. RESP: normal respiratory effort MSK:  No gross joint deformities.   NEUROLOGICAL: Mental Status: Alert, oriented to person, place and time, Follows commands, and Speech fluent and appropriate. 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  Significant improvement of migraine headaches on topiramate 100 mg nightly.    PLAN: -Prevention: Continue topiramate 100 mg nightly -Rescue: Continue Reyvow 100 mg PRN -Next steps: consider CGRP, Botox for  prevention   Follow-up in 6 months or call earlier if needed    I spent 21 minutes of face-to-face and non-face-to-face time with patient and mother.  This included previsit chart review, lab review, study review, order entry, electronic health record documentation, patient and mother education and discussion regarding the above and answered all the questions to patient's satisfaction  Frann Rider, [facility]  [facility] [address] [place], [place] [address]  Phone [phone] Fax [phone] Note: This document was prepared with digital dictation and possible smart phrase technology. Any transcriptional errors that result from this process are unintentional.

## 2023-02-01 ENCOUNTER — Ambulatory Visit (INDEPENDENT_AMBULATORY_CARE_PROVIDER_SITE_OTHER): Payer: Medicaid Other | Admitting: Adult Health

## 2023-02-01 ENCOUNTER — Encounter: Payer: Self-pay | Admitting: Adult Health

## 2023-02-01 VITALS — BP 100/70 | HR 83 | Ht 64.0 in | Wt 133.2 lb

## 2023-02-01 DIAGNOSIS — I608 Other nontraumatic subarachnoid hemorrhage: Secondary | ICD-10-CM

## 2023-02-01 DIAGNOSIS — G43009 Migraine without aura, not intractable, without status migrainosus: Secondary | ICD-10-CM | POA: Diagnosis not present

## 2023-02-01 NOTE — Patient Instructions (Signed)
Continue topamax 100mg  nightly for headache prevention  Continue use of Reyvow as needed for acute migraine management    Follow up in 6 months or call earlier if needed

## 2023-02-21 ENCOUNTER — Ambulatory Visit (INDEPENDENT_AMBULATORY_CARE_PROVIDER_SITE_OTHER): Payer: PRIVATE HEALTH INSURANCE | Admitting: Gastroenterology

## 2023-02-21 ENCOUNTER — Encounter: Payer: Self-pay | Admitting: Gastroenterology

## 2023-02-21 VITALS — BP 118/70 | HR 62 | Ht 64.0 in | Wt 134.0 lb

## 2023-02-21 DIAGNOSIS — Z1211 Encounter for screening for malignant neoplasm of colon: Secondary | ICD-10-CM | POA: Diagnosis not present

## 2023-02-21 DIAGNOSIS — Z8679 Personal history of other diseases of the circulatory system: Secondary | ICD-10-CM | POA: Diagnosis not present

## 2023-02-21 MED ORDER — NA SULFATE-K SULFATE-MG SULF 17.5-3.13-1.6 GM/177ML PO SOLN: 1.0000 | Freq: Once | ORAL | 0 refills | Status: AC

## 2023-02-21 NOTE — Progress Notes (Signed)
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 Karle Plumber for colon cancer screening.  The patient denies any previous colonoscopies or other colon cancer screening tests.  He has no known family history of colon cancer. He denies any chronic GI symptoms.  Specifically, he denies abdominal pain, constipation, diarrhea or blood in the stool.  His weight is stable. He also denies any chronic upper GI symptoms such as nausea/vomiting, heartburn, acid regurgitation, dyspepsia or dysphagia.  He suffered an [place] in 2022 but has recovered well.  His remaining deficits seem to be mostly related to memory.  He lives with his mother who manages his medications.   He is an active smoker but has no known cardiopulmonary comorbidities.  He has an occasional cough, but denies wheezing or shortness of breath.  He does not exercise but walks most days, up to 30 minutes.    Past Medical History:  Diagnosis Date   Alcohol abuse    Drug abuse ([place])    Right rotator cuff tear    Seasonal allergies      Past Surgical History:  Procedure Laterality Date   AMPUTATION Left 05/30/2019   Procedure: REVISION AMPUTATION OF L INDEX FINGER;  Surgeon: Leanora Cover, MD;  Location: [place];  Service: Orthopedics;  Laterality: Left;   ANEURYSM COILING  05/19/2021   Anterior communicating aneurysm   IR ANGIO INTRA EXTRACRAN SEL INTERNAL CAROTID UNI R MOD SED  05/20/2021   IR ANGIO INTRA EXTRACRAN SEL INTERNAL CAROTID UNI R MOD SED  02/08/2022   IR ANGIOGRAM FOLLOW UP STUDY  05/20/2021   IR GASTROSTOMY TUBE MOD SED  06/15/2021   IR GASTROSTOMY TUBE REMOVAL  12/03/2021   IR TRANSCATH/EMBOLIZ  05/20/2021   IR US GUIDE VASC ACCESS RIGHT  02/08/2022   LAPAROSCOPIC REVISION VENTRICULAR-PERITONEAL (V-P) SHUNT Right 06/08/2021   Procedure: LAPAROSCOPIC ASSISTED VENTRICULAR-PERITONEAL (V-P) SHUNT;  Surgeon:  Georganna Skeans, MD;  Location: [place];  Service: General;  Laterality: Right;   RADIOLOGY WITH ANESTHESIA N/A 05/20/2021   Procedure: IR WITH ANESTHESIA;  Surgeon: Consuella Lose, MD;  Location: [place];  Service: Radiology;  Laterality: N/A;   SHOULDER ARTHROSCOPY WITH OPEN ROTATOR CUFF REPAIR AND DISTAL CLAVICLE ACROMINECTOMY Right 07/03/2020   Procedure: SHOULDER ARTHROSCOPY WITH OPEN ROTATOR CUFF REPAIR AND DISTAL CLAVICLE ARTHROSCOPIC DEBRIDEMENT;  Surgeon: Earlie Server, MD;  Location: [facility];  Service: Orthopedics;  Laterality: Right;   SHOULDER CLOSED REDUCTION Right 10/28/2020   Procedure: CLOSED MANIPULATION SHOULDER;  Surgeon: Earlie Server, MD;  Location: [place];  Service: Orthopedics;  Laterality: Right;   toe amputaion Left yrs ago   VENTRICULOPERITONEAL SHUNT N/A 06/08/2021   Procedure: SHUNT INSERTION VENTRICULAR-PERITONEAL;  Surgeon: Consuella Lose, MD;  Location: [place];  Service: Neurosurgery;  Laterality: N/A;   Family History  Problem Relation Age of Onset   Hypertension Mother    Other Father        hx unknown   Hypertension Maternal Grandmother    Cancer Maternal Grandfather        type unknown   Social History   Tobacco Use   Smoking status: Every Day    Packs/day: 0.25    Years: 30.00    Total pack years: 7.50    Types: Cigarettes   Smokeless tobacco: Never  Vaping Use   Vaping Use: Never used  Substance Use Topics   Alcohol use: Not Currently    Alcohol/week: 3.0 standard drinks of alcohol    Types: 3 Cans of beer per week   Drug use: Not Currently    Types: Marijuana, "Crack" cocaine    Comment: former cocaine/crack last used yrs ago, marijuana last used 04/2021   Current Outpatient Medications  Medication Sig Dispense Refill   acetaminophen (TYLENOL) 325 MG tablet Take 2 tablets (650 mg total) by mouth 2 (two) times daily as needed for mild pain (or temp > 37.5 C (99.5 F)).     amLODipine (NORVASC) 5 MG  tablet Take 1 tablet (5 mg total) by mouth daily. 90 tablet 1   diclofenac Sodium (VOLTAREN) 1 % GEL Apply 2 g topically 4 (four) times daily. 100 g 2   Lasmiditan Succinate (REYVOW) 100 MG TABS Take 100 mg by mouth as needed (for migraine). Max dose 1 pill in 24 hours. Avoid driving for 8 hours after taking this medication. 10 tablet 5   QUEtiapine (SEROQUEL) 25 MG tablet Take 0.5 tablets (12.5 mg total) by mouth at bedtime. 30 tablet 2   rosuvastatin (CRESTOR) 20 MG tablet Take 20 mg by mouth daily.     senna-docusate (SENOKOT-S) 8.6-50 MG tablet Take 1 tablet by mouth 2 (two) times daily as needed for mild constipation. 60 tablet 3   sertraline (ZOLOFT) 50 MG tablet Take 1 tablet (50 mg total) by mouth daily. 30 tablet 4   topiramate (TOPAMAX) 100 MG tablet Take 100 mg by mouth at bedtime.     Vitamin D, Ergocalciferol, (DRISDOL) 1.25 MG (50000 UNIT) CAPS capsule TAKE 1 CAPSULE BY MOUTH 1 TIME A WEEK 12 capsule 0   No current facility-administered medications for this visit.   No Known Allergies   Review of Systems: All systems reviewed and negative except where noted in HPI.    No results found.  Physical Exam: BP 118/70   Pulse 62   Ht '5\' 4"'$  (1.626 m)   Wt 134 lb (60.8 kg)   BMI 23.00 kg/m  Constitutional: Pleasant,well-developed, African American male in no acute distress.  Accompanied by his mother HEENT: Normocephalic and atraumatic. Conjunctivae are normal. No scleral icterus.  Numerous teeth missing, no loose teeth Neck supple.  Cardiovascular: Normal rate, regular rhythm.  Pulmonary/chest: Effort normal and breath sounds normal. No wheezing, rales or rhonchi. Abdominal: Soft, nondistended, nontender. Bowel sounds active throughout. There are no masses palpable. No hepatomegaly.  Remote gastrostomy scar present Extremities: no edema Neurological: Alert and oriented to person place and time. Skin: Skin is warm and dry. No rashes noted. Psychiatric: Normal mood and  affect. Behavior is normal.  CBC    Component Value Date/Time   WBC 4.9 02/08/2022 0818   RBC 4.47 02/08/2022 0818   HGB 14.0 02/08/2022 0818   HGB 12.0 (L) 12/14/2013 0530   HCT 41.3 02/08/2022 0818   HCT 35.5 (L) 12/14/2013 0530   PLT PLATELET CLUMPS NOTED ON SMEAR, UNABLE TO ESTIMATE 02/08/2022 0818   PLT 252 12/14/2013 0530   MCV 92.4 02/08/2022 0818   MCV 95 12/14/2013 0530   MCH 31.3 02/08/2022 0818   MCHC 33.9 02/08/2022 0818   RDW 14.1 02/08/2022 0818   RDW 13.0 12/14/2013 0530   LYMPHSABS 1.8 02/08/2022 0818   LYMPHSABS 1.5 12/14/2013 0530   MONOABS 0.4 02/08/2022 0818   MONOABS 0.6 12/14/2013 0530   EOSABS 0.3 02/08/2022 0818   EOSABS 0.4 12/14/2013 0530   BASOSABS 0.0 02/08/2022 0818  BASOSABS 0.1 12/14/2013 0530    CMP     Component Value Date/Time   NA 141 03/28/2022 1019   NA 137 12/14/2013 0530   K 4.2 03/28/2022 1019   K 3.4 (L) 12/14/2013 0530   CL 106 03/28/2022 1019   CL 105 12/14/2013 0530   CO2 22 03/28/2022 1019   CO2 26 12/14/2013 0530   GLUCOSE 83 03/28/2022 1019   GLUCOSE 98 02/08/2022 0818   GLUCOSE 124 (H) 12/14/2013 0530   BUN 7 (L) 03/28/2022 1019   BUN 11 12/14/2013 0530   CREATININE 0.75 (L) 03/28/2022 1019   CREATININE 0.81 12/14/2013 0530   CALCIUM 9.3 03/28/2022 1019   CALCIUM 8.4 (L) 12/14/2013 0530   PROT 7.2 03/28/2022 1019   PROT 6.1 (L) 12/13/2013 1927   ALBUMIN 4.5 03/28/2022 1019   ALBUMIN 3.1 (L) 12/13/2013 1927   AST 12 03/28/2022 1019   AST 26 12/13/2013 1927   ALT 12 03/28/2022 1019   ALT 22 12/13/2013 1927   ALKPHOS 83 03/28/2022 1019   ALKPHOS 60 12/13/2013 1927   BILITOT 0.3 03/28/2022 1019   BILITOT 0.4 12/13/2013 1927   GFRNONAA >60 02/08/2022 0818   GFRNONAA >60 12/14/2013 0530   GFRAA >60 05/30/2019 1258   GFRAA >60 12/14/2013 0530     ASSESSMENT AND PLAN:  62 year old male with history of SAH from ACA aneurysm and communicating hydrocephalus s/p VP shunt overdue for initial average risk  screening colonoscopy.  He has no chronic GI symptoms and no family history of colon cancer.  He is not on any blood thinners and does not have any active cardiopulmonary comorbidities that would preclude an elective sedated procedure in our endoscopy suite. Will schedule patient for colonoscopy  Colon cancer screening - Colonoscopy  The details, risks (including bleeding, perforation, infection, missed lesions, medication reactions and possible hospitalization or surgery if complications occur), benefits, and alternatives to colonoscopy with possible biopsy and possible polypectomy were discussed with the patient and he consents to proceed.   Tarance Balan E. Candis Schatz, MD [facility]  CC:  Ladell Pier, MD

## 2023-02-21 NOTE — Patient Instructions (Signed)
_______________________________________________________  If your blood pressure at your visit was 140/90 or greater, please contact your primary care physician to follow up on this.  _______________________________________________________  If you are age 61 or older, your body mass index should be between 23-30. Your Body mass index is 23 kg/m. If this is out of the aforementioned range listed, please consider follow up with your Primary Care Provider.  If you are age 108 or younger, your body mass index should be between 19-25. Your Body mass index is 23 kg/m. If this is out of the aformentioned range listed, please consider follow up with your Primary Care Provider.   You have been scheduled for a colonoscopy. Please follow written instructions given to you at your visit today.  Please pick up your prep supplies at the pharmacy within the next 1-3 days. If you use inhalers (even only as needed), please bring them with you on the day of your procedure.   The [facility] providers would like to encourage you to use [facility] to communicate with providers for non-urgent requests or questions.  Due to long hold times on the telephone, sending your provider a message by [facility] may be a faster and more efficient way to get a response.  Please allow 48 business hours for a response.  Please remember that this is for non-urgent requests.   It was a pleasure to see you today!  Thank you for trusting me with your gastrointestinal care!    Scott E.Candis Schatz , MD

## 2023-02-23 ENCOUNTER — Telehealth: Payer: Self-pay | Admitting: Gastroenterology

## 2023-02-23 NOTE — Telephone Encounter (Signed)
Dr. Candis Schatz please see note below and advise.

## 2023-02-23 NOTE — Telephone Encounter (Signed)
Inbound call from patients mother requesting a call back to discuss patients upcoming procedure with Dr. Candis Schatz on 4/4 at 3:30. Patients mother stated that 2 years ago he had a shunt placed in his brain and was not sure if she mentioned that to Dr. Candis Schatz and just wanted to make sure patient would be okay with procedure. Please advise.

## 2023-02-23 NOTE — Telephone Encounter (Signed)
Attempted to call pt but unable to reach pt or leave a message.

## 2023-02-24 NOTE — Telephone Encounter (Signed)
Unable to reach pt or leave a message  

## 2023-02-24 NOTE — Telephone Encounter (Signed)
Spoke with pts mother and she is aware of Dr. Dayle Points comments.

## 2023-03-13 ENCOUNTER — Other Ambulatory Visit: Payer: Self-pay | Admitting: Vascular Surgery

## 2023-03-13 DIAGNOSIS — Z122 Encounter for screening for malignant neoplasm of respiratory organs: Secondary | ICD-10-CM

## 2023-03-13 DIAGNOSIS — R911 Solitary pulmonary nodule: Secondary | ICD-10-CM

## 2023-03-22 DIAGNOSIS — I609 Nontraumatic subarachnoid hemorrhage, unspecified: Secondary | ICD-10-CM | POA: Insufficient documentation

## 2023-03-30 ENCOUNTER — Telehealth: Payer: Self-pay | Admitting: Gastroenterology

## 2023-03-30 ENCOUNTER — Ambulatory Visit (AMBULATORY_SURGERY_CENTER): Payer: Medicaid Other | Admitting: Gastroenterology

## 2023-03-30 ENCOUNTER — Telehealth: Payer: Self-pay

## 2023-03-30 ENCOUNTER — Encounter: Payer: Self-pay | Admitting: Gastroenterology

## 2023-03-30 ENCOUNTER — Encounter: Payer: Medicaid Other | Admitting: Gastroenterology

## 2023-03-30 VITALS — BP 120/86 | HR 66 | Temp 98.0°F | Resp 15 | Ht 64.0 in | Wt 134.0 lb

## 2023-03-30 DIAGNOSIS — Z1211 Encounter for screening for malignant neoplasm of colon: Secondary | ICD-10-CM

## 2023-03-30 DIAGNOSIS — D123 Benign neoplasm of transverse colon: Secondary | ICD-10-CM | POA: Diagnosis not present

## 2023-03-30 MED ORDER — SODIUM CHLORIDE 0.9 % IV SOLN: 500.0000 mL | Freq: Once | INTRAVENOUS | Status: DC

## 2023-03-30 NOTE — Telephone Encounter (Signed)
Returned patients phone call and spoke to his mother. Patient was able to finish all prep and keep it down. He reports passing brown liquid, encouraged patient to continue to drink clear liquids until 1:30pm and come for the scheduled appointment. Both mother and patient agreed to [place]

## 2023-03-30 NOTE — Progress Notes (Signed)
Called to room to assist during endoscopic procedure.  Patient ID and intended procedure confirmed with present staff. Received instructions for my participation in the procedure from the performing physician.  

## 2023-03-30 NOTE — Telephone Encounter (Signed)
Patient's mother calling states he is unable to finish the second round of prep. Please advise

## 2023-03-30 NOTE — Progress Notes (Signed)
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 Karle Plumber for colon cancer screening.  The patient denies any previous colonoscopies or other colon cancer screening tests.  He has no known family history of colon cancer. He denies any chronic GI symptoms.  Specifically, he denies abdominal pain, constipation, diarrhea or blood in the stool.  His weight is stable. He also denies any chronic upper GI symptoms such as nausea/vomiting, heartburn, acid regurgitation, dyspepsia or dysphagia.   He suffered an [place] in 2022 but has recovered well.  His remaining deficits seem to be mostly related to memory.  He lives with his mother who manages his medications.    He is an active smoker but has no known cardiopulmonary comorbidities.  He has an occasional cough, but denies wheezing or shortness of breath.  He does not exercise but walks most days, up to 30 minutes.           Past Medical History:  Diagnosis Date   Alcohol abuse     Drug abuse ([place])     Right rotator cuff tear     Seasonal allergies               Past Surgical History:  Procedure Laterality Date   AMPUTATION Left 05/30/2019    Procedure: REVISION AMPUTATION OF L INDEX FINGER;  Surgeon: Leanora Cover, MD;  Location: [facility];  Service: Orthopedics;  Laterality: Left;   ANEURYSM COILING   05/19/2021    Anterior communicating aneurysm   IR ANGIO INTRA EXTRACRAN SEL INTERNAL CAROTID UNI R MOD SED   05/20/2021   IR ANGIO INTRA EXTRACRAN SEL INTERNAL CAROTID UNI R MOD SED   02/08/2022   IR ANGIOGRAM FOLLOW UP STUDY   05/20/2021   IR GASTROSTOMY TUBE MOD SED   06/15/2021   IR GASTROSTOMY TUBE REMOVAL   12/03/2021   IR TRANSCATH/EMBOLIZ   05/20/2021   IR US GUIDE VASC ACCESS RIGHT   02/08/2022   LAPAROSCOPIC REVISION VENTRICULAR-PERITONEAL (V-P) SHUNT Right 06/08/2021    Procedure: LAPAROSCOPIC ASSISTED  VENTRICULAR-PERITONEAL (V-P) SHUNT;  Surgeon: Georganna Skeans, MD;  Location: [facility];  Service: General;  Laterality: Right;   RADIOLOGY WITH ANESTHESIA N/A 05/20/2021    Procedure: IR WITH ANESTHESIA;  Surgeon: Consuella Lose, MD;  Location: [facility];  Service: Radiology;  Laterality: N/A;   SHOULDER ARTHROSCOPY WITH OPEN ROTATOR CUFF REPAIR AND DISTAL CLAVICLE ACROMINECTOMY Right 07/03/2020    Procedure: SHOULDER ARTHROSCOPY WITH OPEN ROTATOR CUFF REPAIR AND DISTAL CLAVICLE ARTHROSCOPIC DEBRIDEMENT;  Surgeon: Earlie Server, MD;  Location: [facility];  Service: Orthopedics;  Laterality: Right;   SHOULDER CLOSED REDUCTION Right 10/28/2020    Procedure: CLOSED MANIPULATION SHOULDER;  Surgeon: Earlie Server, MD;  Location: [facility];  Service: Orthopedics;  Laterality: Right;   toe amputaion Left yrs ago   VENTRICULOPERITONEAL SHUNT N/A 06/08/2021    Procedure: SHUNT INSERTION VENTRICULAR-PERITONEAL;  Surgeon: Consuella Lose, MD;  Location: [facility];  Service: Neurosurgery;  Laterality: N/A;         Family History  Problem Relation Age of Onset   Hypertension Mother     Other Father          hx unknown   Hypertension Maternal Grandmother     Cancer Maternal Grandfather          type unknown  Social History         Tobacco Use   Smoking status: Every Day      Packs/day: 0.25      Years: 30.00      Total pack years: 7.50      Types: Cigarettes   Smokeless tobacco: Never  Vaping Use   Vaping Use: Never used  Substance Use Topics   Alcohol use: Not Currently      Alcohol/week: 3.0 standard drinks of alcohol      Types: 3 Cans of beer per week   Drug use: Not Currently      Types: Marijuana, "Crack" cocaine      Comment: former cocaine/crack last used yrs ago, marijuana last used 04/2021          Current Outpatient Medications  Medication Sig Dispense Refill   acetaminophen (TYLENOL) 325 MG tablet Take 2 tablets (650 mg total) by mouth 2 (two)  times daily as needed for mild pain (or temp > 37.5 C (99.5 F)).       amLODipine (NORVASC) 5 MG tablet Take 1 tablet (5 mg total) by mouth daily. 90 tablet 1   diclofenac Sodium (VOLTAREN) 1 % GEL Apply 2 g topically 4 (four) times daily. 100 g 2   Lasmiditan Succinate (REYVOW) 100 MG TABS Take 100 mg by mouth as needed (for migraine). Max dose 1 pill in 24 hours. Avoid driving for 8 hours after taking this medication. 10 tablet 5   QUEtiapine (SEROQUEL) 25 MG tablet Take 0.5 tablets (12.5 mg total) by mouth at bedtime. 30 tablet 2   rosuvastatin (CRESTOR) 20 MG tablet Take 20 mg by mouth daily.       senna-docusate (SENOKOT-S) 8.6-50 MG tablet Take 1 tablet by mouth 2 (two) times daily as needed for mild constipation. 60 tablet 3   sertraline (ZOLOFT) 50 MG tablet Take 1 tablet (50 mg total) by mouth daily. 30 tablet 4   topiramate (TOPAMAX) 100 MG tablet Take 100 mg by mouth at bedtime.       Vitamin D, Ergocalciferol, (DRISDOL) 1.25 MG (50000 UNIT) CAPS capsule TAKE 1 CAPSULE BY MOUTH 1 TIME A WEEK 12 capsule 0    No current facility-administered medications for this visit.    No Known Allergies     Review of Systems: All systems reviewed and negative except where noted in HPI.      Imaging Results  No results found.     Physical Exam: BP 118/70   Pulse 62   Ht 5\' 4"  (1.626 m)   Wt 134 lb (60.8 kg)   BMI 23.00 kg/m  Constitutional: Pleasant,well-developed, African American male in no acute distress.  Accompanied by his mother HEENT: Normocephalic and atraumatic. Conjunctivae are normal. No scleral icterus.  Numerous teeth missing, no loose teeth Neck supple.  Cardiovascular: Normal rate, regular rhythm.  Pulmonary/chest: Effort normal and breath sounds normal. No wheezing, rales or rhonchi. Abdominal: Soft, nondistended, nontender. Bowel sounds active throughout. There are no masses palpable. No hepatomegaly.  Remote gastrostomy scar present Extremities: no  edema Neurological: Alert and oriented to person place and time. Skin: Skin is warm and dry. No rashes noted. Psychiatric: Normal mood and affect. Behavior is normal.   CBC Labs (Brief)          Component Value Date/Time    WBC 4.9 02/08/2022 0818    RBC 4.47 02/08/2022 0818    HGB 14.0 02/08/2022 0818    HGB 12.0 (L) 12/14/2013 0530  HCT 41.3 02/08/2022 0818    HCT 35.5 (L) 12/14/2013 0530    PLT PLATELET CLUMPS NOTED ON SMEAR, UNABLE TO ESTIMATE 02/08/2022 0818    PLT 252 12/14/2013 0530    MCV 92.4 02/08/2022 0818    MCV 95 12/14/2013 0530    MCH 31.3 02/08/2022 0818    MCHC 33.9 02/08/2022 0818    RDW 14.1 02/08/2022 0818    RDW 13.0 12/14/2013 0530    LYMPHSABS 1.8 02/08/2022 0818    LYMPHSABS 1.5 12/14/2013 0530    MONOABS 0.4 02/08/2022 0818    MONOABS 0.6 12/14/2013 0530    EOSABS 0.3 02/08/2022 0818    EOSABS 0.4 12/14/2013 0530    BASOSABS 0.0 02/08/2022 0818    BASOSABS 0.1 12/14/2013 0530        CMP     Labs (Brief)          Component Value Date/Time    NA 141 03/28/2022 1019    NA 137 12/14/2013 0530    K 4.2 03/28/2022 1019    K 3.4 (L) 12/14/2013 0530    CL 106 03/28/2022 1019    CL 105 12/14/2013 0530    CO2 22 03/28/2022 1019    CO2 26 12/14/2013 0530    GLUCOSE 83 03/28/2022 1019    GLUCOSE 98 02/08/2022 0818    GLUCOSE 124 (H) 12/14/2013 0530    BUN 7 (L) 03/28/2022 1019    BUN 11 12/14/2013 0530    CREATININE 0.75 (L) 03/28/2022 1019    CREATININE 0.81 12/14/2013 0530    CALCIUM 9.3 03/28/2022 1019    CALCIUM 8.4 (L) 12/14/2013 0530    PROT 7.2 03/28/2022 1019    PROT 6.1 (L) 12/13/2013 1927    ALBUMIN 4.5 03/28/2022 1019    ALBUMIN 3.1 (L) 12/13/2013 1927    AST 12 03/28/2022 1019    AST 26 12/13/2013 1927    ALT 12 03/28/2022 1019    ALT 22 12/13/2013 1927    ALKPHOS 83 03/28/2022 1019    ALKPHOS 60 12/13/2013 1927    BILITOT 0.3 03/28/2022 1019    BILITOT 0.4 12/13/2013 1927    GFRNONAA >60 02/08/2022 0818    GFRNONAA >60  12/14/2013 0530    GFRAA >60 05/30/2019 1258    GFRAA >60 12/14/2013 0530          ASSESSMENT AND PLAN:   62 year old male with history 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  He has no chronic GI symptoms and no family history of colon cancer.  He is not on any blood thinners and does not have any active cardiopulmonary comorbidities that would preclude an elective sedated procedure in our endoscopy suite. Will schedule patient for colonoscopy   Colon cancer screening - Colonoscopy   The details, risks (including bleeding, perforation, infection, missed lesions, medication reactions and possible hospitalization or surgery if complications occur), benefits, and alternatives to colonoscopy with possible biopsy and possible polypectomy were discussed with the patient and he consents to proceed.    Scott E. Candis Schatz, MD [facility]   CC:  Ladell Pier, MD    Patient got confused regarding date of colonoscopy.  Today "walk-in" colon  Dr. Candis Schatz schedule is full  hence I volunteered to do his colonoscopy.  RG

## 2023-03-30 NOTE — Patient Instructions (Signed)
Handouts Provided:  Polyps, Diverticulosis and High fiber diet  RESUME previous high fiber diet.  YOU HAD AN ENDOSCOPIC PROCEDURE TODAY AT [facility]:   Refer to the procedure report that was given to you for any specific questions about what was found during the examination.  If the procedure report does not answer your questions, please call your gastroenterologist to clarify.  If you requested that your care partner not be given the details of your procedure findings, then the procedure report has been included in a sealed envelope for you to review at your convenience later.  YOU SHOULD EXPECT: Some feelings of bloating in the abdomen. Passage of more gas than usual.  Walking can help get rid of the air that was put into your GI tract during the procedure and reduce the bloating. If you had a lower endoscopy (such as a colonoscopy or flexible sigmoidoscopy) you may notice spotting of blood in your stool or on the toilet paper. If you underwent a bowel prep for your procedure, you may not have a normal bowel movement for a few days.  Please Note:  You might notice some irritation and congestion in your nose or some drainage.  This is from the oxygen used during your procedure.  There is no need for concern and it should clear up in a day or so.  SYMPTOMS TO REPORT IMMEDIATELY:  Following lower endoscopy (colonoscopy or flexible sigmoidoscopy):  Excessive amounts of blood in the stool  Significant tenderness or worsening of abdominal pains  Swelling of the abdomen that is new, acute  Fever of 100F or higher  For urgent or emergent issues, a gastroenterologist can be reached at any hour by calling [phone]. Do not use MyChart messaging for urgent concerns.    DIET:  We do recommend a small meal at first, but then you may proceed to your regular diet.  Drink plenty of fluids but you should avoid alcoholic beverages for 24 hours.  ACTIVITY:  You should plan to take it  easy for the rest of today and you should NOT DRIVE or use heavy machinery until tomorrow (because of the sedation medicines used during the test).    FOLLOW UP: Our staff will call the number listed on your records the next business day following your procedure.  We will call around 7:15- 8:00 am to check on you and address any questions or concerns that you may have regarding the information given to you following your procedure. If we do not reach you, we will leave a message.     If any biopsies were taken you will be contacted by phone or by letter within the next 1-3 weeks.  Please call us at [phone] if you have not heard about the biopsies in 3 weeks.    SIGNATURES/CONFIDENTIALITY: You and/or your care partner have signed paperwork which will be entered into your electronic medical record.  These signatures attest to the fact that that the information above on your After Visit Summary has been reviewed and is understood.  Full responsibility of the confidentiality of this discharge information lies with you and/or your care-partner.

## 2023-03-30 NOTE — Progress Notes (Signed)
Vss nad trans to pacu 

## 2023-03-30 NOTE — Op Note (Signed)
[facility] Patient Name: [name] [name] Procedure Date: 03/30/2023 3:32 PM MRN: [id number] Endoscopist: Jackquline Denmark , MD, [id number] Age: 62 Referring MD:  Date of Birth: Oct 04, 1961 Gender: Male Account #: [id number] Procedure:                Colonoscopy Indications:              Screening for colorectal malignant neoplasm Medicines:                Monitored Anesthesia Care Procedure:                Pre-Anesthesia Assessment:                           - Prior to the procedure, a History and Physical                            was performed, and patient medications and                            allergies were reviewed. The patient's tolerance of                            previous anesthesia was also reviewed. The risks                            and benefits of the procedure and the sedation                            options and risks were discussed with the patient.                            All questions were answered, and informed consent                            was obtained. Prior Anticoagulants: The patient has                            taken no anticoagulant or antiplatelet agents. ASA                            Grade Assessment: III - A patient with severe                            systemic disease. After reviewing the risks and                            benefits, the patient was deemed in satisfactory                            condition to undergo the procedure.                           After obtaining informed consent, the colonoscope  was passed under direct vision. Throughout the                            procedure, the patient's blood pressure, pulse, and                            oxygen saturations were monitored continuously. The                            Olympus CF-HQ190L ([id number]) Colonoscope was                            introduced through the anus and advanced to the the                            cecum, identified  by appendiceal orifice and                            ileocecal valve. The colonoscopy was technically                            difficult and complex due to a tortuous colon.                            Successful completion of the procedure was aided by                            applying abdominal pressure. The patient tolerated                            the procedure well. The quality of the bowel                            preparation was good. The ileocecal valve,                            appendiceal orifice, and rectum were photographed. Scope In: 3:36:36 PM Scope Out: 3:58:50 PM Scope Withdrawal Time: 0 hours 15 minutes 10 seconds  Total Procedure Duration: 0 hours 22 minutes 14 seconds  Findings:                 A 6 mm polyp was found in the mid transverse colon.                            The polyp was sessile. The polyp was removed with a                            cold snare. Resection and retrieval were complete.                           A few small-mouthed diverticula were found in the  sigmoid colon, transverse colon and ascending colon.                           Non-bleeding internal hemorrhoids were found during                            retroflexion. The hemorrhoids were small and Grade                            I (internal hemorrhoids that do not prolapse).                           A diffuse area of mild melanosis was found in the                            sigmoid colon, in the descending colon and in the                            ascending colon.                           The exam was otherwise without abnormality on                            direct and retroflexion views. The colon was highly                            redundant. Complications:            No immediate complications. Estimated Blood Loss:     Estimated blood loss: none. Impression:               - One 6 mm polyp in the mid transverse colon,                             removed with a cold snare. Resected and retrieved.                           - Mild pancolonic diverticulosis.                           - Mild melanosis coli.                           - The examination was otherwise normal on direct                            and retroflexion views. Recommendation:           - Patient has a contact number available for                            emergencies. The signs and symptoms of potential                            delayed complications were discussed with  the                            patient. Return to normal activities tomorrow.                            Written discharge instructions were provided to the                            patient.                           - Resume previous high-fiber diet.                           - Continue present medications.                           - Await pathology results.                           - Repeat colonoscopy for surveillance based on                            pathology results.                           - FU with Dr Dustin Flock as needed.                           - The findings and recommendations were discussed                            with the patient's family. Jackquline Denmark, MD 03/30/2023 4:04:29 PM This report has been signed electronically.

## 2023-03-31 ENCOUNTER — Telehealth: Payer: Self-pay | Admitting: *Deleted

## 2023-03-31 NOTE — Telephone Encounter (Signed)
Attempted to call patient for their post-procedure follow-up call. No answer. Unable to leave voicemail due to voicemail box not set up yet. If you have any questions or concerns following your procedure yesterday at [facility] please give [place] a call.

## 2023-04-04 ENCOUNTER — Telehealth: Payer: Self-pay | Admitting: Gastroenterology

## 2023-04-04 NOTE — Telephone Encounter (Signed)
Heather from [facility] called in regards to appointment scheduled for tomorrow for patient for a pre visit. They are wondering if pre visit and procedure should be kept for 5/1 as the patient just had a colonoscopy screening with Dr. Chales Abrahams on 4/4. Call back phone for Herbert Seta at [facility] is [phone].

## 2023-04-04 NOTE — Telephone Encounter (Signed)
Dr.Cunningham please see the note below. Does pt need another colon? Please advise.

## 2023-04-05 NOTE — Telephone Encounter (Signed)
Spoke with Herbert Seta and she is aware, previsit and colon appts cancelled.

## 2023-04-13 ENCOUNTER — Encounter: Payer: Self-pay | Admitting: Gastroenterology

## 2023-04-26 ENCOUNTER — Encounter: Payer: Medicaid Other | Admitting: Gastroenterology

## 2023-08-02 ENCOUNTER — Encounter: Payer: Self-pay | Admitting: Adult Health

## 2023-08-02 ENCOUNTER — Ambulatory Visit (INDEPENDENT_AMBULATORY_CARE_PROVIDER_SITE_OTHER): Payer: Medicaid Other | Admitting: Adult Health

## 2023-08-02 VITALS — BP 119/78 | HR 73 | Ht 64.0 in | Wt 142.6 lb

## 2023-08-02 DIAGNOSIS — G43009 Migraine without aura, not intractable, without status migrainosus: Secondary | ICD-10-CM

## 2023-08-02 NOTE — Progress Notes (Signed)
CC:  headaches Chief Complaint  Patient presents with   Follow-up    Patient in room #8 with his mother. Patient states he hasn't had an migraines since the last visit.    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  At prior visit, reported significant remote headaches with no recent headache occurrence on topiramate 100 mg nightly.  Recommended continuation. Continue Reyvow for rescue but had not needed to use.      Interval History:  Patient accompanied by his mother.  Doing well since prior visit, denies having any headaches since that time.  Currently taking topiramate 50mg  nightly per mother - [facility] decreased dose from 100mg  as he has been doing well. Does have rx for Reyvow but has not needed.     Headache days per month: 0 Headache free days per month: 30  Current Headache Regimen: Preventative: Topamax 100 mg QHS  Abortive: Reyvow   Prior Therapies                                  Amlodipine Zoloft 50 mg daily Topamax 25 mg QHS Qulipta 60 mg daily Nurtec - helped but insurance stopped covering [place] -insurance not covering    Current Outpatient Medications on File Prior to Visit  Medication Sig Dispense Refill   acetaminophen (TYLENOL) 325 MG tablet Take 2 tablets (650 mg total) by mouth 2 (two) times daily as needed for mild pain (or temp > 37.5 C (99.5 F)).     diclofenac Sodium (VOLTAREN) 1 % GEL Apply 2 g topically 4 (four) times daily. 100 g 2   Lasmiditan Succinate (REYVOW) 100 MG TABS Take 100 mg by mouth as needed (for migraine). Max dose 1 pill in 24 hours. Avoid driving for 8 hours after taking this medication. 10 tablet 5   polyethylene glycol (MIRALAX / GLYCOLAX) 17 g packet Take 17 g by mouth daily.     QUEtiapine  (SEROQUEL) 25 MG tablet Take 0.5 tablets (12.5 mg total) by mouth at bedtime. 30 tablet 2   rosuvastatin (CRESTOR) 20 MG tablet Take 20 mg by mouth daily.     senna-docusate (SENOKOT-S) 8.6-50 MG tablet Take 1 tablet by mouth 2 (two) times daily as needed for mild constipation. 60 tablet 3   sertraline (ZOLOFT) 50 MG tablet Take 1 tablet (50 mg total) by mouth daily. 30 tablet 4   topiramate (TOPAMAX) 100 MG tablet Take 100 mg by mouth at bedtime.     Vitamin D, Ergocalciferol, (DRISDOL) 1.25 MG (50000 UNIT) CAPS capsule TAKE 1 CAPSULE BY MOUTH 1 TIME A WEEK 12 capsule 0   amLODipine (NORVASC) 5 MG tablet Take 1 tablet (5 mg total) by mouth daily. (Patient not taking: Reported on 08/02/2023) 90 tablet 1   No current facility-administered medications on file prior to visit.   Past Medical History:  Diagnosis Date   Alcohol abuse    Aneurysm (HCC) 2022   "brain"   Drug abuse (HCC)    Insomnia    Right rotator cuff tear    Seasonal allergies    Past Surgical History:  Procedure Laterality Date   AMPUTATION Left 05/30/2019   Procedure: REVISION AMPUTATION OF L INDEX FINGER;  Surgeon: Merlyn Lot,  Caryn Bee, MD;  Location: [facility] OR;  Service: Orthopedics;  Laterality: Left;   ANEURYSM COILING  05/19/2021   Anterior communicating aneurysm   IR ANGIO INTRA EXTRACRAN SEL INTERNAL CAROTID UNI R MOD SED  05/20/2021   IR ANGIO INTRA EXTRACRAN SEL INTERNAL CAROTID UNI R MOD SED  02/08/2022   IR ANGIOGRAM FOLLOW UP STUDY  05/20/2021   IR GASTROSTOMY TUBE MOD SED  06/15/2021   IR GASTROSTOMY TUBE REMOVAL  12/03/2021   IR TRANSCATH/EMBOLIZ  05/20/2021   IR US GUIDE VASC ACCESS RIGHT  02/08/2022   LAPAROSCOPIC REVISION VENTRICULAR-PERITONEAL (V-P) SHUNT Right 06/08/2021   Procedure: LAPAROSCOPIC ASSISTED VENTRICULAR-PERITONEAL (V-P) SHUNT;  Surgeon: Violeta Gelinas, MD;  Location: [facility] OR;  Service: General;  Laterality: Right;   RADIOLOGY WITH ANESTHESIA N/A 05/20/2021   Procedure: IR WITH ANESTHESIA;  Surgeon: Lisbeth Renshaw, MD;  Location: [facility] OR;  Service: Radiology;  Laterality: N/A;   SHOULDER ARTHROSCOPY WITH OPEN ROTATOR CUFF REPAIR AND DISTAL CLAVICLE ACROMINECTOMY Right 07/03/2020   Procedure: SHOULDER ARTHROSCOPY WITH OPEN ROTATOR CUFF REPAIR AND DISTAL CLAVICLE ARTHROSCOPIC DEBRIDEMENT;  Surgeon: Frederico Hamman, MD;  Location: [facility];  Service: Orthopedics;  Laterality: Right;   SHOULDER CLOSED REDUCTION Right 10/28/2020   Procedure: CLOSED MANIPULATION SHOULDER;  Surgeon: Frederico Hamman, MD;  Location: [facility];  Service: Orthopedics;  Laterality: Right;   toe amputaion Left yrs ago   VENTRICULOPERITONEAL SHUNT N/A 06/08/2021   Procedure: SHUNT INSERTION VENTRICULAR-PERITONEAL;  Surgeon: Lisbeth Renshaw, MD;  Location: [facility] OR;  Service: Neurosurgery;  Laterality: N/A;       Physical Exam:   Vital Signs: BP 119/78 (BP Location: Left Arm, Patient Position: Sitting, Cuff Size: Small)   Pulse 73   Ht 5\' 4"  (1.626 m)   Wt 142 lb 9.6 oz (64.7 kg)   BMI 24.48 kg/m  GENERAL:  well appearing, in no acute distress, alert  SKIN:  Color, texture, turgor normal. No rashes or lesions HEAD:  Normocephalic/atraumatic. RESP: normal respiratory effort MSK:  No gross joint deformities.   NEUROLOGICAL: Mental Status: Alert, oriented to person, place and time, Follows commands, and Speech fluent and appropriate. 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  Headaches continue to be well-controlled on topiramate.    PLAN: -Prevention: Continue topiramate 50 mg nightly - [facility] lowered dose per mother, recommend continued monitoring of  headaches and if still remains stable, can consider further reducing dosage but will defer to [facility] -Rescue: Continue Reyvow 100 mg PRN -Next steps: consider CGRP, Botox for prevention   Currently stable from neurological standpoint and can return back to [facility] providers for ongoing management at this time.     I spent 20 minutes of face-to-face and non-face-to-face time with patient and mother.  This included previsit chart review, lab review, study review, order entry, electronic health record documentation, patient and mother education and discussion regarding the above and answered all the questions to patient's satisfaction  Ihor Austin, [facility]  [facility] [address] [place], [name] [address]  Phone [phone] Fax [phone] Note: This document was prepared with digital dictation and possible smart phrase technology. Any transcriptional errors that result from this process are unintentional.

## 2023-08-02 NOTE — Patient Instructions (Signed)
Continue topamax at current dosage - can consider further lowering dosage as long as headaches remain stable - this can be done by [facility] provider     Follow up as needed at this time - please call with any worsening headaches

## 2023-09-14 ENCOUNTER — Encounter: Payer: Self-pay | Admitting: Pulmonary Disease

## 2023-09-15 ENCOUNTER — Institutional Professional Consult (permissible substitution): Payer: Medicaid Other | Admitting: Pulmonary Disease

## 2023-11-01 ENCOUNTER — Institutional Professional Consult (permissible substitution): Payer: Medicaid Other | Admitting: Pulmonary Disease

## 2023-11-07 ENCOUNTER — Encounter: Payer: Self-pay | Admitting: Pulmonary Disease

## 2023-11-07 ENCOUNTER — Ambulatory Visit (INDEPENDENT_AMBULATORY_CARE_PROVIDER_SITE_OTHER): Payer: Medicare Other | Admitting: Pulmonary Disease

## 2023-11-07 VITALS — BP 104/70 | HR 75 | Temp 98.0°F | Ht 65.0 in | Wt 141.2 lb

## 2023-11-07 DIAGNOSIS — R911 Solitary pulmonary nodule: Secondary | ICD-10-CM | POA: Diagnosis not present

## 2023-11-07 DIAGNOSIS — F17210 Nicotine dependence, cigarettes, uncomplicated: Secondary | ICD-10-CM | POA: Diagnosis not present

## 2023-11-07 NOTE — Progress Notes (Signed)
@Patient  ID: [name] [name], male    DOB: Oct 18, 1961, 62 y.o.   MRN: [id number]  Chief Complaint  Patient presents with   Consult    Referred by [facility] for lung cancer screening program.    Last CT 01/06/2023.    Referring provider: [facility]  HPI:   62 y.o. man whom we are seeing for evaluation of lung nodule.  Note from referring provider reviewed.  Most recent neurology note reviewed.  Most recent GI note reviewed.  Patient was enrolled in lung cancer screening it sounds like via PCP office.  Underwent low-dose CT scan 12/2022.  This was read as lung RADS 4 with concerning 1.2 cm nodule right lower lobe.  On my review interpretation as it is reported in the report there is a cluster of nodules here and I favor this to represent inflammatory etiology although malignancy is not excluded.  A repeat CT scan is recommended in 3 months.  This was ordered.  Appears maybe he no showed this appointment.  Perhaps it was never scheduled.  Certainly was ordered.  Regardless it was not done.  He is never a smoker down to a couple cigarettes a day.  He denies any significant dyspnea on exertion.  No shortness of breath.  No cough.  No real respiratory complaints.  Spent a lot of time in the room today discussing results of prior scan with patient and mother.  Discussed role and rationale for additional scanning which they agree.  Discussed role and rationale for biopsy.  Discussed likely navigational robotic bronchoscopy in detail with patient and mother in the room.  Discussed need for repeat scan in terms of accuracy of surgery if needed.  Discussed need for repeat scan that if is getting smaller no need for procedure and would be reassuring.  But if same size or enlarging would need biopsy.  They expressed understanding.  He is on no blood thinners.   Questionaires / Pulmonary Flowsheets:   ACT:      No data to display          MMRC:     No data to display           Epworth:      No data to display          Tests:   FENO:  No results found for: "NITRICOXIDE"  PFT:     No data to display          WALK:      No data to display          Imaging: Personally reviewed and as per EMR and discussion in this note No results found.  Lab Results: Personally reviewed CBC    Component Value Date/Time   WBC 4.9 02/08/2022 0818   RBC 4.47 02/08/2022 0818   HGB 14.0 02/08/2022 0818   HGB 12.0 (L) 12/14/2013 0530   HCT 41.3 02/08/2022 0818   HCT 35.5 (L) 12/14/2013 0530   PLT PLATELET CLUMPS NOTED ON SMEAR, UNABLE TO ESTIMATE 02/08/2022 0818   PLT 252 12/14/2013 0530   MCV 92.4 02/08/2022 0818   MCV 95 12/14/2013 0530   MCH 31.3 02/08/2022 0818   MCHC 33.9 02/08/2022 0818   RDW 14.1 02/08/2022 0818   RDW 13.0 12/14/2013 0530   LYMPHSABS 1.8 02/08/2022 0818   LYMPHSABS 1.5 12/14/2013 0530   MONOABS 0.4 02/08/2022 0818   MONOABS 0.6 12/14/2013 0530   EOSABS 0.3 02/08/2022 0818  EOSABS 0.4 12/14/2013 0530   BASOSABS 0.0 02/08/2022 0818   BASOSABS 0.1 12/14/2013 0530    BMET    Component Value Date/Time   NA 141 03/28/2022 1019   NA 137 12/14/2013 0530   K 4.2 03/28/2022 1019   K 3.4 (L) 12/14/2013 0530   CL 106 03/28/2022 1019   CL 105 12/14/2013 0530   CO2 22 03/28/2022 1019   CO2 26 12/14/2013 0530   GLUCOSE 83 03/28/2022 1019   GLUCOSE 98 02/08/2022 0818   GLUCOSE 124 (H) 12/14/2013 0530   BUN 7 (L) 03/28/2022 1019   BUN 11 12/14/2013 0530   CREATININE 0.75 (L) 03/28/2022 1019   CREATININE 0.81 12/14/2013 0530   CALCIUM 9.3 03/28/2022 1019   CALCIUM 8.4 (L) 12/14/2013 0530   GFRNONAA >60 02/08/2022 0818   GFRNONAA >60 12/14/2013 0530   GFRAA >60 05/30/2019 1258   GFRAA >60 12/14/2013 0530    BNP No results found for: "BNP"  ProBNP No results found for: "PROBNP"  Specialty Problems   None   No Known Allergies  Immunization History  Administered Date(s) Administered   Influenza-Unspecified  10/23/2021   PFIZER(Purple Top)SARS-COV-2 Vaccination 03/20/2020, 04/08/2020, 11/26/2020   Pfizer Covid-19 Vaccine Bivalent Booster 58yrs & up 11/09/2021   Tdap 05/30/2019    Past Medical History:  Diagnosis Date   Alcohol abuse    Aneurysm (HCC) 2022   "brain"   Drug abuse (HCC)    Insomnia    Right rotator cuff tear    Seasonal allergies     Tobacco History: Social History   Tobacco Use  Smoking Status Every Day   Current packs/day: 0.25   Average packs/day: 0.3 packs/day for 30.0 years (7.5 ttl pk-yrs)   Types: Cigarettes  Smokeless Tobacco Never   Ready to quit: Not Answered Counseling given: Not Answered    Outpatient Encounter Medications as of 11/07/2023  Medication Sig   acetaminophen (TYLENOL) 325 MG tablet Take 2 tablets (650 mg total) by mouth 2 (two) times daily as needed for mild pain (or temp > 37.5 C (99.5 F)).   amLODipine (NORVASC) 5 MG tablet Take 1 tablet (5 mg total) by mouth daily.   diclofenac Sodium (VOLTAREN) 1 % GEL Apply 2 g topically 4 (four) times daily.   fexofenadine (ALLEGRA) 180 MG tablet Take 180 mg by mouth daily.   Lasmiditan Succinate (REYVOW) 100 MG TABS Take 100 mg by mouth as needed (for migraine). Max dose 1 pill in 24 hours. Avoid driving for 8 hours after taking this medication. (Patient taking differently: Take 50 mg by mouth as needed (for migraine). Max dose 1 pill in 24 hours. Avoid driving for 8 hours after taking this medication.)   melatonin 3 MG TABS tablet Take 3 mg by mouth at bedtime.   polyethylene glycol (MIRALAX / GLYCOLAX) 17 g packet Take 17 g by mouth daily.   QUEtiapine (SEROQUEL) 25 MG tablet Take 0.5 tablets (12.5 mg total) by mouth at bedtime.   rosuvastatin (CRESTOR) 20 MG tablet Take 20 mg by mouth daily.   senna-docusate (SENOKOT-S) 8.6-50 MG tablet Take 1 tablet by mouth 2 (two) times daily as needed for mild constipation.   sertraline (ZOLOFT) 50 MG tablet Take 1 tablet (50 mg total) by mouth daily.    topiramate (TOPAMAX) 100 MG tablet Take 100 mg by mouth at bedtime.   Vitamin D, Ergocalciferol, (DRISDOL) 1.25 MG (50000 UNIT) CAPS capsule TAKE 1 CAPSULE BY MOUTH 1 TIME A WEEK   No  facility-administered encounter medications on file as of 11/07/2023.     Review of Systems  Review of Systems  No chest pain with exertion.  No orthopnea or PND.  Comprehensive review of systems otherwise negative.    Physical Exam  BP 104/70 (BP Location: Left Arm, Patient Position: Sitting, Cuff Size: Normal)   Pulse 75   Temp 98 F (36.7 C) (Oral)   Ht 5\' 5"  (1.651 m)   Wt 141 lb 3.2 oz (64 kg)   SpO2 99%   BMI 23.50 kg/m   Wt Readings from Last 5 Encounters:  11/07/23 141 lb 3.2 oz (64 kg)  08/02/23 142 lb 9.6 oz (64.7 kg)  03/30/23 134 lb (60.8 kg)  02/21/23 134 lb (60.8 kg)  02/01/23 133 lb 3.2 oz (60.4 kg)    BMI Readings from Last 5 Encounters:  11/07/23 23.50 kg/m  08/02/23 24.48 kg/m  03/30/23 23.00 kg/m  02/21/23 23.00 kg/m  02/01/23 22.86 kg/m     Physical Exam General: Sitting in chair, no acute distress Eyes: EOMI, no icterus Neck: Supple, no JVP Pulmonary: Distant, normal work of breathing Cardiovascular: Warm, no edema Abdomen: Nondistended, bowel sounds present MSK: No synovitis, no joint effusion Neuro: Normal gait, no weakness Psych: Normal mood, full affect  Assessment & Plan:   Lung nodule: CT scan 12/2022 with a cluster of right lower lobe nodules the largest 1.2 cm recommended 65-month follow-up.  The scan was ordered.  He did not come for the CT scan.  He is overdue for this.  Urgent CT super D ordered today for follow-up.  If not improving or enlarging recommend biopsy.  Discussed in detail.  Tobacco Abuse: Congratulated on decreasing smoking over time.  Encouraged strict abstinence.  If nodule has improved will refer to lung cancer screening program. Smoking assessment and cessation counseling I have advised the patient to quit/stop smoking as soon  as possible due to high risk for multiple medical problems.  It will also be very difficult for [place] to manage patient's  respiratory symptoms and status if we continue to expose her lungs to a known irritant.  Patient is willing to quit smoking. I have advised the patient that we can assist and have options of nicotine replacement therapy, provided smoking cessation education today, provided smoking cessation counseling, and provided cessation resources. Follow-up next office visit office visit for assessment of smoking cessation.  I spent 3 minutes in smoking cessation counseling.    Return if symptoms worsen or fail to improve.   Karren Burly, MD 11/07/2023   This appointment required 64 minutes of patient care (this includes precharting, chart review, review of results, face-to-face care, etc.).

## 2023-11-07 NOTE — Patient Instructions (Signed)
Nice to meet you  We need to get a CT scan done soon to keep an eye on it on the nodule seen in January  My hope is that the nodule is getting smaller and there is nothing to worry about  But if the nodule is the same size or getting larger on the CT scan and it does make me worry a bit about cancer.  If that is the case we will need to do a biopsy.  I can help arrange that.  I ordered the CT scan to be done in [facility] but I asked them to look anywhere in [place] so we can get the scan done for your next few days.  Based on the results we will discuss next steps.

## 2023-11-08 ENCOUNTER — Ambulatory Visit
Admission: RE | Admit: 2023-11-08 | Discharge: 2023-11-08 | Disposition: A | Discharge status: Home / Self Care | Payer: Medicare Other | Source: Ambulatory Visit | Attending: Pulmonary Disease | Admitting: Pulmonary Disease

## 2023-11-08 DIAGNOSIS — R911 Solitary pulmonary nodule: Secondary | ICD-10-CM

## 2023-11-09 ENCOUNTER — Telehealth: Payer: Self-pay | Admitting: Pulmonary Disease

## 2023-11-09 NOTE — Telephone Encounter (Signed)
DRI dropped off CT disk. It will be placed in Dr.Hunsucker's box.

## 2023-12-04 ENCOUNTER — Telehealth: Payer: Self-pay | Admitting: Pulmonary Disease

## 2023-12-04 NOTE — Telephone Encounter (Signed)
DRI Disk will be placed in Dr.Hunsucker's box.

## 2024-05-30 ENCOUNTER — Telehealth: Payer: Self-pay | Admitting: *Deleted

## 2024-05-30 NOTE — Telephone Encounter (Signed)
 Copied from CRM [id number]. Topic: Appointments - Scheduling Inquiry for Clinic >> May 29, 2024 10:34 AM Justina Oman C wrote: Reason for CRM: Patient's mother Erna He [phone] states is patient guardian and needs to accompany patient for office visit. Erna He states [facility] went to [facility] yesterday and was diagnosis with a cold, coughing, and sneezing, fatigue, no shortness of breath. Erna He wants patient to be seen earlier, but 06/11/24 is unable. Unable to schedule due to [employer] is showing no solution found without overruling. Patient is already on wait list. Please advise.  ATC patient's mother, Lula ([facility]).  Patient was last seen in the office by Dr. Marygrace Snellen for a lung nodule.  He will need to contact his PCP for an appointment.

## 2024-06-05 NOTE — Telephone Encounter (Signed)
 Called and spoke with patient's mother, Erna He ([place]), I advised her that we only see him for a pulmonary nodule and he would need to see his PCP for his symptoms.  She said he goes to [facility] and he is getting better.  While we were on the phone, she wanted to verify his appointment in July, I let her know it was 07/18/24 at 9 am and he would need to arrive by 8:45 am for check in.  She said she does not drive and she depends on someone to drive them and he takes other people to appointments as well.  She requested something around 11 am.  I changed the appointment to 07/17/24 at 11:30 am, advised to arrive by 11:15 am for check in.  She verbalized understanding.  Nothing further needed.

## 2024-06-12 ENCOUNTER — Ambulatory Visit: Admitting: Nurse Practitioner

## 2024-06-21 ENCOUNTER — Encounter (HOSPITAL_COMMUNITY): Payer: Self-pay | Admitting: Interventional Radiology

## 2024-07-17 ENCOUNTER — Ambulatory Visit: Admitting: Nurse Practitioner

## 2024-07-17 ENCOUNTER — Encounter: Payer: Self-pay | Admitting: Nurse Practitioner

## 2024-07-17 VITALS — BP 110/80 | HR 66 | Ht 64.0 in | Wt 147.4 lb

## 2024-07-17 DIAGNOSIS — F17210 Nicotine dependence, cigarettes, uncomplicated: Secondary | ICD-10-CM | POA: Diagnosis not present

## 2024-07-17 DIAGNOSIS — R911 Solitary pulmonary nodule: Secondary | ICD-10-CM | POA: Diagnosis not present

## 2024-07-17 DIAGNOSIS — F17200 Nicotine dependence, unspecified, uncomplicated: Secondary | ICD-10-CM

## 2024-07-17 NOTE — Patient Instructions (Signed)
 Last CT chest looked better. You are not a candidate for the lung cancer screening program since you don't have a 20 pack year history; however given your continued smoking and history, recommend we continue yearly CT scans for monitoring  Work on quitting smoking!  CT chest in early November 2025  Follow up with Dr. Annella mid November 2025 to review CT results. If symptoms worsen, please contact office for sooner follow up or seek emergency care.

## 2024-07-17 NOTE — Progress Notes (Signed)
 @Patient  ID: [name] [name], male    DOB: [birth year], 63 y.o.   MRN: [id number]  Chief Complaint  Patient presents with   Follow-up    Ct result    Referring provider: Cloria Annabella CROME, DO  HPI: 63 year old male, active smoker followed for lung nodule. Past medical history significant for HTN, cognitive impairment, hx of aneurysmal subarachnoid hemorrhage, substance abuse in remission, hydrocephalus s/p VP shunt.   TEST/EVENTS:  11/08/2023 CT chest: atherosclerosis. Emphysema. B/l lower lobe volume loss. Peribronchovascular nodularity lower lobes, resolved. Debris in the airway. Small hiatal hernia.  Ventriculoperitoneal shunt in abd.   11/07/2023: OV with Dr. Annella. LDCT chest 12/2022 Lung RADS 4 concerning 1.2 cm nodule RLL. Repeat CT in 3 months but never attended. No SOB. No cough. Discussed need for repeat CT imaging; urgent order placed. If same size or enlarging, would need biopsy.   07/17/2024: Today - follow up Discussed the use of AI scribe software for clinical note transcription with the patient, who gave verbal consent to proceed.  History of Present Illness [name] [name] is a 64 year old male who presents for follow-up after a CT scan and discussion of lung cancer screening eligibility.  He underwent a CT scan in November, which showed resolution in previous nodular findings.  He has a history of smoking since age 25, with a maximum 0.25 ppd. His smoking history amounts to an 11 to 12 pack-year history, which does not qualify him for the lung cancer screening program that requires a 20 pack-year history.  No symptoms such as hemoptysis, weight loss, or appetite changes. No issues with breathing or cough.     No Known Allergies  Immunization History  Administered Date(s) Administered   Influenza-Unspecified 10/23/2021   PFIZER(Purple Top)SARS-COV-2 Vaccination 03/20/2020, 04/08/2020, 11/26/2020   Pfizer Covid-19 Vaccine Bivalent Booster 15yrs & up  11/09/2021   Tdap 05/30/2019    Past Medical History:  Diagnosis Date   Alcohol abuse    Aneurysm (HCC) 2022   brain   Drug abuse (HCC)    Insomnia    Right rotator cuff tear    Seasonal allergies     Tobacco History: Social History   Tobacco Use  Smoking Status Every Day   Current packs/day: 0.25   Average packs/day: 0.3 packs/day for 47.6 years (11.9 ttl pk-yrs)   Types: Cigarettes   Start date: 1978  Smokeless Tobacco Never  Tobacco Comments   I-2 cigarettes a day.   Ready to quit: Not Answered Counseling given: Not Answered Tobacco comments: I-2 cigarettes a day.   Outpatient Medications Prior to Visit  Medication Sig Dispense Refill   acetaminophen  (TYLENOL ) 325 MG tablet Take 2 tablets (650 mg total) by mouth 2 (two) times daily as needed for mild pain (or temp > 37.5 C (99.5 F)).     diclofenac  Sodium (VOLTAREN ) 1 % GEL Apply 2 g topically 4 (four) times daily. 100 g 2   fexofenadine (ALLEGRA) 180 MG tablet Take 180 mg by mouth daily.     fexofenadine (ALLEGRA) 180 MG tablet Take 180 mg by mouth daily.     QUEtiapine  (SEROQUEL ) 25 MG tablet Take 0.5 tablets (12.5 mg total) by mouth at bedtime. 30 tablet 2   rosuvastatin (CRESTOR) 20 MG tablet Take 20 mg by mouth daily.     sertraline  (ZOLOFT ) 50 MG tablet Take 1 tablet (50 mg total) by mouth daily. 30 tablet 4   topiramate  (TOPAMAX ) 100 MG tablet Take 100 mg  by mouth at bedtime.     Vitamin D , Ergocalciferol , (DRISDOL ) 1.25 MG (50000 UNIT) CAPS capsule TAKE 1 CAPSULE BY MOUTH 1 TIME A WEEK 12 capsule 0   amLODipine  (NORVASC ) 5 MG tablet Take 1 tablet (5 mg total) by mouth daily. (Patient not taking: Reported on 07/17/2024) 90 tablet 1   Lasmiditan  Succinate (REYVOW ) 100 MG TABS Take 100 mg by mouth as needed (for migraine). Max dose 1 pill in 24 hours. Avoid driving for 8 hours after taking this medication. (Patient taking differently: Take 50 mg by mouth as needed (for migraine). Max dose 1 pill in 24 hours.  Avoid driving for 8 hours after taking this medication.) 10 tablet 5   melatonin 3 MG TABS tablet Take 3 mg by mouth at bedtime. (Patient not taking: Reported on 07/17/2024)     polyethylene glycol (MIRALAX  / GLYCOLAX ) 17 g packet Take 17 g by mouth daily. (Patient not taking: Reported on 07/17/2024)     senna-docusate (SENOKOT-S) 8.6-50 MG tablet Take 1 tablet by mouth 2 (two) times daily as needed for mild constipation. 60 tablet 3   No facility-administered medications prior to visit.     Review of Systems:   Constitutional: No weight loss or gain, night sweats, fevers, chills, fatigue, or lassitude. CV:  No chest pain Resp: No shortness of breath with exertion or at rest. No cough GI:  No heartburn, indigestion Neuro: No dizziness or lightheadedness.  Psych: No depression or anxiety. Mood stable.     Physical Exam:  BP 110/80 (BP Location: Left Arm, Patient Position: Sitting, Cuff Size: Normal)   Pulse 66   Ht 5' 4 (1.626 m)   Wt 147 lb 6.4 oz (66.9 kg)   SpO2 99%   BMI 25.30 kg/m   GEN: Pleasant, interactive, well-appearing; in no acute distress HEENT:  Normocephalic and atraumatic. PERRLA. Sclera white. Nasal turbinates pink, moist and patent bilaterally. No rhinorrhea present. Oropharynx pink and moist, without exudate or edema. No lesions, ulcerations, or postnasal drip.  NECK:  Supple w/ fair ROM. No lymphadenopathy.   CV: RRR, no m/r/g, no peripheral edema. Pulses intact, +2 bilaterally. No cyanosis, pallor or clubbing. PULMONARY:  Unlabored, regular breathing. Clear bilaterally A&P w/o wheezes/rales/rhonchi.   GI: BS present and normoactive. Soft, non-tender to palpation. No organomegaly or masses detected. MSK: No erythema, warmth or tenderness. Cap refil <2 sec all extrem.  Neuro: A/Ox3. No focal deficits noted.   Skin: Warm, no lesions or rashe Psych: Normal affect and behavior. Judgement and thought content appropriate.     Lab Results:  CBC    Component  Value Date/Time   WBC 4.9 02/08/2022 0818   RBC 4.47 02/08/2022 0818   HGB 14.0 02/08/2022 0818   HGB 12.0 (L) 12/14/2013 0530   HCT 41.3 02/08/2022 0818   HCT 35.5 (L) 12/14/2013 0530   PLT PLATELET CLUMPS NOTED ON SMEAR, UNABLE TO ESTIMATE 02/08/2022 0818   PLT 252 12/14/2013 0530   MCV 92.4 02/08/2022 0818   MCV 95 12/14/2013 0530   MCH 31.3 02/08/2022 0818   MCHC 33.9 02/08/2022 0818   RDW 14.1 02/08/2022 0818   RDW 13.0 12/14/2013 0530   LYMPHSABS 1.8 02/08/2022 0818   LYMPHSABS 1.5 12/14/2013 0530   MONOABS 0.4 02/08/2022 0818   MONOABS 0.6 12/14/2013 0530   EOSABS 0.3 02/08/2022 0818   EOSABS 0.4 12/14/2013 0530   BASOSABS 0.0 02/08/2022 0818   BASOSABS 0.1 12/14/2013 0530    BMET    Component Value  Date/Time   NA 141 03/28/2022 1019   NA 137 12/14/2013 0530   K 4.2 03/28/2022 1019   K 3.4 (L) 12/14/2013 0530   CL 106 03/28/2022 1019   CL 105 12/14/2013 0530   CO2 22 03/28/2022 1019   CO2 26 12/14/2013 0530   GLUCOSE 83 03/28/2022 1019   GLUCOSE 98 02/08/2022 0818   GLUCOSE 124 (H) 12/14/2013 0530   BUN 7 (L) 03/28/2022 1019   BUN 11 12/14/2013 0530   CREATININE 0.75 (L) 03/28/2022 1019   CREATININE 0.81 12/14/2013 0530   CALCIUM  9.3 03/28/2022 1019   CALCIUM  8.4 (L) 12/14/2013 0530   GFRNONAA >60 02/08/2022 0818   GFRNONAA >60 12/14/2013 0530   GFRAA >60 05/30/2019 1258   GFRAA >60 12/14/2013 0530    BNP No results found for: BNP   Imaging:  No results found.  Administration History     None           No data to display          No results found for: NITRICOXIDE      Assessment & Plan:   Assessment & Plan Lung nodules  Previous CT scan with nodular changes resolved on repeat imaging, possibly related to inflammation or recent illness. No symptoms such as hemoptysis, weight loss, or appetite changes. Breathing is well-managed and lung sounds are clear. Does not qualify for lung cancer screening due to pack year history <20  (11-12 pack years). Will continue to monitor yearly.  - Perform annual CT scan of the chest to monitor lung status. - Schedule next CT scan for November. - Schedule follow-up appointment for mid to end of November after CT scan results are available.    Advised if symptoms do not improve or worsen, to please contact office for sooner follow up or seek emergency care.   I spent 25 minutes of dedicated to the care of this patient on the date of this encounter to include pre-visit review of records, face-to-face time with the patient discussing conditions above, post visit ordering of testing, clinical documentation with the electronic health record, making appropriate referrals as documented, and communicating necessary findings to members of the patients care team.  Comer LULLA Rouleau, NP 07/17/2024  Pt aware and understands NP's role.

## 2024-07-17 NOTE — Assessment & Plan Note (Signed)
 Smoking cessation advised.

## 2024-07-18 ENCOUNTER — Ambulatory Visit: Admitting: Nurse Practitioner

## 2024-10-30 ENCOUNTER — Other Ambulatory Visit (HOSPITAL_COMMUNITY): Payer: Self-pay | Admitting: Neurosurgery

## 2024-10-30 DIAGNOSIS — I609 Nontraumatic subarachnoid hemorrhage, unspecified: Secondary | ICD-10-CM

## 2024-10-31 ENCOUNTER — Other Ambulatory Visit (HOSPITAL_COMMUNITY)

## 2024-11-04 ENCOUNTER — Other Ambulatory Visit

## 2024-11-07 ENCOUNTER — Encounter (HOSPITAL_COMMUNITY): Payer: Self-pay

## 2024-11-07 ENCOUNTER — Ambulatory Visit (HOSPITAL_COMMUNITY)
Admission: RE | Admit: 2024-11-07 | Discharge: 2024-11-07 | Disposition: A | Discharge status: Home / Self Care | Source: Ambulatory Visit | Attending: Neurosurgery | Admitting: Neurosurgery

## 2024-11-07 DIAGNOSIS — I609 Nontraumatic subarachnoid hemorrhage, unspecified: Secondary | ICD-10-CM

## 2024-11-08 ENCOUNTER — Ambulatory Visit
Admission: RE | Admit: 2024-11-08 | Discharge: 2024-11-08 | Disposition: A | Discharge status: Home / Self Care | Payer: PRIVATE HEALTH INSURANCE | Source: Ambulatory Visit | Attending: Nurse Practitioner | Admitting: Nurse Practitioner

## 2024-11-08 DIAGNOSIS — F17210 Nicotine dependence, cigarettes, uncomplicated: Secondary | ICD-10-CM

## 2024-11-08 DIAGNOSIS — R911 Solitary pulmonary nodule: Secondary | ICD-10-CM

## 2024-11-13 ENCOUNTER — Ambulatory Visit: Payer: Self-pay | Admitting: Nurse Practitioner

## 2024-11-13 NOTE — Progress Notes (Signed)
 No new or enlarging nodules. Recommend return to the lung cancer screening program; please place referral and notify patient.  He does have chronic changes of plaque buildup in the arteries and emphysema. Should f/u with his PCP or heart doctor about the plaque buildup/CAD. Thanks

## 2024-11-27 ENCOUNTER — Encounter (HOSPITAL_COMMUNITY): Payer: Self-pay

## 2024-11-27 ENCOUNTER — Ambulatory Visit (HOSPITAL_COMMUNITY)
Admission: RE | Admit: 2024-11-27 | Discharge: 2024-11-27 | Disposition: A | Payer: PRIVATE HEALTH INSURANCE | Source: Ambulatory Visit | Attending: Neurosurgery

## 2024-11-27 DIAGNOSIS — I609 Nontraumatic subarachnoid hemorrhage, unspecified: Secondary | ICD-10-CM | POA: Insufficient documentation

## 2024-11-28 ENCOUNTER — Telehealth: Payer: Self-pay | Admitting: *Deleted

## 2024-11-28 DIAGNOSIS — R911 Solitary pulmonary nodule: Secondary | ICD-10-CM

## 2024-11-28 NOTE — Telephone Encounter (Signed)
 Copied from CRM #[id number]. Topic: Clinical - Lab/Test Results >> Nov 26, 2024 12:40 PM Lavanda D wrote: Reason for CRM: Patient's mother is calling regarding the letter about [name] [name] recent chest CT results. She is requesting a call back to review.  No new or enlarging nodules. Recommend return to the lung cancer screening program; please place referral and notify patient.  He does have chronic changes of plaque buildup in the arteries and emphysema. Should f/u with his PCP or heart doctor about the plaque buildup/CAD. Thanks    I called and spoke with the pt's Mother, ok per DPR and notified of results/recs per [place]. Pt agreeable to screening program and referral was placed. Nothing further needed.

## 2024-11-29 ENCOUNTER — Other Ambulatory Visit: Payer: Self-pay
